# Patient Record
Sex: Male | Born: 1985 | Race: Black or African American | Hispanic: No | Smoking: Never smoker
Health system: Southern US, Community
[De-identification: ages and names within clinical notes are randomized; demographics above are authoritative.]

## PROBLEM LIST (undated history)

## (undated) DIAGNOSIS — K766 Portal hypertension: Secondary | ICD-10-CM

## (undated) DIAGNOSIS — K7030 Alcoholic cirrhosis of liver without ascites: Secondary | ICD-10-CM

## (undated) DIAGNOSIS — D732 Chronic congestive splenomegaly: Secondary | ICD-10-CM

## (undated) HISTORY — PX: MANDIBLE FRACTURE SURGERY: SHX706

## (undated) HISTORY — PX: COSMETIC SURGERY: SHX468

---

## 2017-05-21 ENCOUNTER — Encounter (HOSPITAL_COMMUNITY): Payer: Self-pay | Admitting: Family Medicine

## 2017-05-21 ENCOUNTER — Emergency Department (HOSPITAL_COMMUNITY)
Admission: EM | Admit: 2017-05-21 | Discharge: 2017-05-21 | Disposition: A | Discharge status: Home / Self Care | Payer: No Typology Code available for payment source | Attending: Emergency Medicine | Admitting: Emergency Medicine

## 2017-05-21 DIAGNOSIS — S60511A Abrasion of right hand, initial encounter: Secondary | ICD-10-CM | POA: Diagnosis not present

## 2017-05-21 DIAGNOSIS — T07XXXA Unspecified multiple injuries, initial encounter: Secondary | ICD-10-CM | POA: Diagnosis present

## 2017-05-21 DIAGNOSIS — Y999 Unspecified external cause status: Secondary | ICD-10-CM | POA: Diagnosis not present

## 2017-05-21 DIAGNOSIS — S00411A Abrasion of right ear, initial encounter: Secondary | ICD-10-CM | POA: Insufficient documentation

## 2017-05-21 DIAGNOSIS — S50811A Abrasion of right forearm, initial encounter: Secondary | ICD-10-CM | POA: Insufficient documentation

## 2017-05-21 DIAGNOSIS — Y9389 Activity, other specified: Secondary | ICD-10-CM | POA: Diagnosis not present

## 2017-05-21 DIAGNOSIS — Y92410 Unspecified street and highway as the place of occurrence of the external cause: Secondary | ICD-10-CM | POA: Insufficient documentation

## 2017-05-21 MED ORDER — IBUPROFEN 600 MG PO TABS: 600.0000 mg | ORAL_TABLET | Freq: Four times a day (QID) | ORAL | 0 refills | Status: DC | PRN

## 2017-05-21 MED ORDER — CYCLOBENZAPRINE HCL 10 MG PO TABS: 10.0000 mg | ORAL_TABLET | Freq: Two times a day (BID) | ORAL | 0 refills | Status: DC | PRN
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
## 2017-07-10 ENCOUNTER — Encounter (HOSPITAL_COMMUNITY): Payer: Self-pay

## 2017-07-10 ENCOUNTER — Emergency Department (HOSPITAL_COMMUNITY)
Admission: EM | Admit: 2017-07-10 | Discharge: 2017-07-10 | Disposition: A | Discharge status: Home / Self Care | Payer: Self-pay | Attending: Emergency Medicine | Admitting: Emergency Medicine

## 2017-07-10 DIAGNOSIS — Y990 Civilian activity done for income or pay: Secondary | ICD-10-CM | POA: Insufficient documentation

## 2017-07-10 DIAGNOSIS — Y9301 Activity, walking, marching and hiking: Secondary | ICD-10-CM | POA: Insufficient documentation

## 2017-07-10 DIAGNOSIS — S41112A Laceration without foreign body of left upper arm, initial encounter: Secondary | ICD-10-CM | POA: Insufficient documentation

## 2017-07-10 DIAGNOSIS — Y929 Unspecified place or not applicable: Secondary | ICD-10-CM | POA: Insufficient documentation

## 2017-07-10 DIAGNOSIS — W260XXA Contact with knife, initial encounter: Secondary | ICD-10-CM | POA: Insufficient documentation

## 2017-07-10 DIAGNOSIS — Z23 Encounter for immunization: Secondary | ICD-10-CM | POA: Insufficient documentation

## 2017-07-10 MED ORDER — LIDOCAINE-EPINEPHRINE (PF) 2 %-1:200000 IJ SOLN
10.0000 mL | Freq: Once | INTRAMUSCULAR | Status: AC
  Administered 2017-07-10: 10 mL
  Filled 2017-07-10: qty 20

## 2017-07-10 MED ORDER — TETANUS-DIPHTH-ACELL PERTUSSIS 5-2.5-18.5 LF-MCG/0.5 IM SUSP
0.5000 mL | Freq: Once | INTRAMUSCULAR | Status: AC
  Administered 2017-07-10: 0.5 mL via INTRAMUSCULAR
  Filled 2017-07-10: qty 0.5
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
## 2017-07-10 NOTE — Discharge Instructions (Signed)
Please read attached information. If you experience any new or worsening signs or symptoms please return to the emergency room for evaluation. Please follow-up with your primary care provider or specialist as discussed. Please use medication prescribed only as directed and discontinue taking if you have any concerning signs or symptoms.   °

## 2017-07-10 NOTE — ED Provider Notes (Signed)
[facility]-EMERGENCY DEPT Provider Note   CSN: [id number] Arrival date & time: 07/10/17  1403     History   Chief Complaint Chief Complaint  Patient presents with  . Laceration    HPI [name] is a 31 y.o. male.  HPI    31 year old male presents today with a laceration to his left arm.  Patient reports he was at work when he cut his left upper arm on a knife that was sitting on a shelf.  He notes a small laceration bleeding controlled.  Uncertain tetanus status.  No neurological deficits, no comp gating features.  No medications prior to arrival.  History reviewed. No pertinent past medical history.  There are no active problems to display for this patient.   Past Surgical History:  Procedure Laterality Date  . MANDIBLE FRACTURE SURGERY         Home Medications    Prior to Admission medications   Medication Sig Start Date End Date Taking? Authorizing Provider  cyclobenzaprine (FLEXERIL) 10 MG tablet Take 1 tablet (10 mg total) by mouth 2 (two) times daily as needed for muscle spasms. 05/21/17   Fayrene Helper, PA-C  ibuprofen (ADVIL,MOTRIN) 600 MG tablet Take 1 tablet (600 mg total) by mouth every 6 (six) hours as needed. 05/21/17   Fayrene Helper, PA-C    Family History No family history on file.  Social History Social History  Substance Use Topics  . Smoking status: Never Smoker  . Smokeless tobacco: Never Used  . Alcohol use Yes     Comment: 3-4 times a week.      Allergies   Patient has no known allergies.   Review of Systems Review of Systems  All other systems reviewed and are negative.    Physical Exam Updated Vital Signs BP (!) 156/101 (BP Location: Right Arm)   Pulse 70   Temp 98.6 F (37 C) (Oral)   Resp 18   SpO2 98%   Physical Exam  Constitutional: He is oriented to person, place, and time. He appears well-developed and well-nourished.  HENT:  Head: Normocephalic and atraumatic.  Eyes: Pupils are equal, round, and reactive to light.  Conjunctivae are normal. Right eye exhibits no discharge. Left eye exhibits no discharge. No scleral icterus.  Neck: Normal range of motion. No JVD present. No tracheal deviation present.  Pulmonary/Chest: Effort normal. No stridor.  Musculoskeletal:  1 cm laceration to the lateral proximal upper extremity-no surrounding redness or discharge-no deep space involvement  Neurological: He is alert and oriented to person, place, and time. Coordination normal.  Psychiatric: He has a normal mood and affect. His behavior is normal. Judgment and thought content normal.  Nursing note and vitals reviewed.    ED Treatments / Results  Labs (all labs ordered are listed, but only abnormal results are displayed) Labs Reviewed - No data to display  EKG  EKG Interpretation None       Radiology No results found.  Procedures .Marland KitchenLaceration Repair Date/Time: 07/10/2017 5:58 PM Performed by: Curlene Dolphin, Kelse Ploch Authorized by: Curlene Dolphin, Catilyn Boggus   Consent:    Alternatives discussed:  No treatment, delayed treatment, observation and referral Anesthesia (see MAR for exact dosages):    Anesthesia method:  Local infiltration   Local anesthetic:  Lidocaine 2% WITH epi Laceration details:    Location:  Shoulder/arm   Shoulder/arm location:  L upper arm   Length (cm):  1 Repair type:    Repair type:  Simple Pre-procedure details:    Preparation:  Patient  was prepped and draped in usual sterile fashion Exploration:    Hemostasis achieved with:  Direct pressure   Wound exploration: wound explored through full range of motion     Wound extent: no fascia violation noted, no foreign bodies/material noted, no muscle damage noted, no nerve damage noted, no tendon damage noted, no underlying fracture noted and no vascular damage noted     Contaminated: no   Treatment:    Area cleansed with:  Betadine and soap and water   Amount of cleaning:  Standard   Irrigation solution:  Tap water Skin repair:    Repair  method:  Sutures   Suture size:  4-0   Suture material:  Fast-absorbing gut   Number of sutures:  2 Approximation:    Approximation:  Close   Vermilion border: well-aligned   Post-procedure details:    Dressing:  Non-adherent dressing   Patient tolerance of procedure:  Tolerated well, no immediate complications   (including critical care time)    Medications Ordered in ED Medications  lidocaine-EPINEPHrine (XYLOCAINE W/EPI) 2 %-1:200000 (PF) injection 10 mL (10 mLs Infiltration Given 07/10/17 1702)  Tdap (BOOSTRIX) injection 0.5 mL (0.5 mLs Intramuscular Given 07/10/17 1703)     Initial Impression / Assessment and Plan / ED Course  I have reviewed the triage vital signs and the nursing notes.  Pertinent labs & imaging results that were available during my care of the patient were reviewed by me and considered in my medical decision making (see chart for details).      Final Clinical Impressions(s) / ED Diagnoses   Final diagnoses:  Laceration of left upper extremity, initial encounter    Labs:   Imaging:  Consults:  Therapeutics:  Discharge Meds:   Assessment/Plan: 31 year old male presents today with laceration.  No deep space involvement, thoroughly cleansed and repaired without complication.  Return precautions given, care instructions given.  Patient verbalized understanding and agreement to today's plan had no further questions or concerns at time discharge.      New Prescriptions New Prescriptions   No medications on file     Eyvonne Mechanic, Cordelia Poche 07/10/17 1800    Pricilla Loveless, MD 07/11/17 1659

## 2019-04-29 DIAGNOSIS — R809 Proteinuria, unspecified: Secondary | ICD-10-CM | POA: Insufficient documentation

## 2019-04-29 DIAGNOSIS — R7303 Prediabetes: Secondary | ICD-10-CM | POA: Diagnosis present

## 2019-12-23 DIAGNOSIS — R771 Abnormality of globulin: Secondary | ICD-10-CM | POA: Insufficient documentation

## 2021-04-25 ENCOUNTER — Inpatient Hospital Stay (HOSPITAL_COMMUNITY): Payer: Medicaid Other

## 2021-04-25 ENCOUNTER — Inpatient Hospital Stay (HOSPITAL_BASED_OUTPATIENT_CLINIC_OR_DEPARTMENT_OTHER)
Admission: EM | Admit: 2021-04-25 | Discharge: 2021-04-28 | DRG: 300 | Disposition: A | Discharge status: Home / Self Care | Payer: Medicaid Other | Attending: Internal Medicine | Admitting: Internal Medicine

## 2021-04-25 ENCOUNTER — Emergency Department (HOSPITAL_BASED_OUTPATIENT_CLINIC_OR_DEPARTMENT_OTHER): Payer: Medicaid Other

## 2021-04-25 ENCOUNTER — Encounter (HOSPITAL_BASED_OUTPATIENT_CLINIC_OR_DEPARTMENT_OTHER): Payer: Self-pay

## 2021-04-25 ENCOUNTER — Other Ambulatory Visit: Payer: Self-pay

## 2021-04-25 DIAGNOSIS — W2201XA Walked into wall, initial encounter: Secondary | ICD-10-CM | POA: Diagnosis present

## 2021-04-25 DIAGNOSIS — F17290 Nicotine dependence, other tobacco product, uncomplicated: Secondary | ICD-10-CM | POA: Diagnosis present

## 2021-04-25 DIAGNOSIS — K3189 Other diseases of stomach and duodenum: Secondary | ICD-10-CM | POA: Diagnosis present

## 2021-04-25 DIAGNOSIS — Y92009 Unspecified place in unspecified non-institutional (private) residence as the place of occurrence of the external cause: Secondary | ICD-10-CM

## 2021-04-25 DIAGNOSIS — I10 Essential (primary) hypertension: Secondary | ICD-10-CM

## 2021-04-25 DIAGNOSIS — D62 Acute posthemorrhagic anemia: Secondary | ICD-10-CM | POA: Diagnosis present

## 2021-04-25 DIAGNOSIS — I864 Gastric varices: Principal | ICD-10-CM | POA: Diagnosis present

## 2021-04-25 DIAGNOSIS — Z20822 Contact with and (suspected) exposure to covid-19: Secondary | ICD-10-CM | POA: Diagnosis present

## 2021-04-25 DIAGNOSIS — F1010 Alcohol abuse, uncomplicated: Secondary | ICD-10-CM | POA: Diagnosis present

## 2021-04-25 DIAGNOSIS — I8510 Secondary esophageal varices without bleeding: Secondary | ICD-10-CM | POA: Diagnosis present

## 2021-04-25 DIAGNOSIS — D500 Iron deficiency anemia secondary to blood loss (chronic): Secondary | ICD-10-CM

## 2021-04-25 DIAGNOSIS — K760 Fatty (change of) liver, not elsewhere classified: Secondary | ICD-10-CM | POA: Diagnosis present

## 2021-04-25 DIAGNOSIS — W19XXXA Unspecified fall, initial encounter: Secondary | ICD-10-CM | POA: Diagnosis present

## 2021-04-25 DIAGNOSIS — K7030 Alcoholic cirrhosis of liver without ascites: Secondary | ICD-10-CM | POA: Diagnosis present

## 2021-04-25 DIAGNOSIS — Z23 Encounter for immunization: Secondary | ICD-10-CM | POA: Diagnosis not present

## 2021-04-25 DIAGNOSIS — K920 Hematemesis: Secondary | ICD-10-CM | POA: Diagnosis present

## 2021-04-25 DIAGNOSIS — R17 Unspecified jaundice: Secondary | ICD-10-CM

## 2021-04-25 DIAGNOSIS — R7401 Elevation of levels of liver transaminase levels: Secondary | ICD-10-CM

## 2021-04-25 DIAGNOSIS — R791 Abnormal coagulation profile: Secondary | ICD-10-CM | POA: Diagnosis present

## 2021-04-25 DIAGNOSIS — K766 Portal hypertension: Secondary | ICD-10-CM | POA: Diagnosis present

## 2021-04-25 DIAGNOSIS — K922 Gastrointestinal hemorrhage, unspecified: Secondary | ICD-10-CM | POA: Diagnosis present

## 2021-04-25 LAB — BASIC METABOLIC PANEL
Anion gap: 8 (ref 5–15)
BUN: 12 mg/dL (ref 6–20)
CO2: 27 mmol/L (ref 22–32)
Calcium: 7.5 mg/dL — ABNORMAL LOW (ref 8.9–10.3)
Chloride: 105 mmol/L (ref 98–111)
Creatinine, Ser: 0.71 mg/dL (ref 0.61–1.24)
GFR, Estimated: >60 mL/min (ref >60)
Glucose, Bld: 114 mg/dL — ABNORMAL HIGH (ref 70–99)
Potassium: 3.7 mmol/L (ref 3.5–5.1)
Sodium: 140 mmol/L (ref 135–145)

## 2021-04-25 LAB — RESP PANEL BY RT-PCR (FLU A&B, COVID) ARPGX2
Influenza A by PCR: NEGATIVE
Influenza B by PCR: NEGATIVE
SARS Coronavirus 2 by RT PCR: NEGATIVE

## 2021-04-25 LAB — URINALYSIS, ROUTINE W REFLEX MICROSCOPIC
Bilirubin Urine: NEGATIVE
Glucose, UA: NEGATIVE mg/dL
Hgb urine dipstick: NEGATIVE
Ketones, ur: NEGATIVE mg/dL
Leukocytes,Ua: NEGATIVE
Nitrite: NEGATIVE
Specific Gravity, Urine: 1.021 (ref 1.005–1.030)
pH: 7 (ref 5.0–8.0)

## 2021-04-25 LAB — CBC
HCT: 15.5 % — ABNORMAL LOW (ref 39.0–52.0)
HCT: 12.8 % — ABNORMAL LOW (ref 39.0–52.0)
Hemoglobin: 4.6 g/dL — CL (ref 13.0–17.0)
Hemoglobin: 4 g/dL — CL (ref 13.0–17.0)
MCH: 21.8 pg — ABNORMAL LOW (ref 26.0–34.0)
MCH: 22.2 pg — ABNORMAL LOW (ref 26.0–34.0)
MCHC: 29.7 g/dL — ABNORMAL LOW (ref 30.0–36.0)
MCHC: 31.3 g/dL (ref 30.0–36.0)
MCV: 73.5 fL — ABNORMAL LOW (ref 80.0–100.0)
MCV: 71.1 fL — ABNORMAL LOW (ref 80.0–100.0)
Platelets: 153 10*3/uL (ref 150–400)
Platelets: 159 10*3/uL (ref 150–400)
RBC: 2.11 MIL/uL — ABNORMAL LOW (ref 4.22–5.81)
RBC: 1.8 MIL/uL — ABNORMAL LOW (ref 4.22–5.81)
RDW: 19.7 % — ABNORMAL HIGH (ref 11.5–15.5)
RDW: 20.1 % — ABNORMAL HIGH (ref 11.5–15.5)
WBC: 5.2 10*3/uL (ref 4.0–10.5)
WBC: 5.6 10*3/uL (ref 4.0–10.5)
nRBC: 0 % (ref 0.0–0.2)
nRBC: 0 % (ref 0.0–0.2)

## 2021-04-25 LAB — PROTIME-INR
INR: 2.1 — ABNORMAL HIGH (ref 0.8–1.2)
Prothrombin Time: 23.1 seconds — ABNORMAL HIGH (ref 11.4–15.2)

## 2021-04-25 LAB — HEPATIC FUNCTION PANEL
ALT: 25 U/L (ref 0–44)
AST: 90 U/L — ABNORMAL HIGH (ref 15–41)
Albumin: 2.7 g/dL — ABNORMAL LOW (ref 3.5–5.0)
Alkaline Phosphatase: 73 U/L (ref 38–126)
Bilirubin, Direct: 2 mg/dL — ABNORMAL HIGH (ref 0.0–0.2)
Indirect Bilirubin: 2.4 mg/dL — ABNORMAL HIGH (ref 0.3–0.9)
Total Bilirubin: 4.4 mg/dL — ABNORMAL HIGH (ref 0.3–1.2)
Total Protein: 7.8 g/dL (ref 6.5–8.1)

## 2021-04-25 LAB — PREPARE RBC (CROSSMATCH)

## 2021-04-25 LAB — CBG MONITORING, ED: Glucose-Capillary: 114 mg/dL — ABNORMAL HIGH (ref 70–99)

## 2021-04-25 LAB — ABO/RH: ABO/RH(D): O POS

## 2021-04-25 LAB — OCCULT BLOOD X 1 CARD TO LAB, STOOL: Fecal Occult Bld: POSITIVE — AB

## 2021-04-25 LAB — MRSA NEXT GEN BY PCR, NASAL: MRSA by PCR Next Gen: NOT DETECTED

## 2021-04-25 MED ORDER — MORPHINE SULFATE (PF) 2 MG/ML IV SOLN: 1.0000 mg | INTRAVENOUS | Status: DC | PRN

## 2021-04-25 MED ORDER — OCTREOTIDE LOAD VIA INFUSION
50.0000 ug | Freq: Once | INTRAVENOUS | Status: AC
Start: 2021-04-25 — End: 2021-04-25
  Administered 2021-04-25: 50 ug via INTRAVENOUS
  Filled 2021-04-25: qty 25

## 2021-04-25 MED ORDER — ACETAMINOPHEN 650 MG RE SUPP: 650.0000 mg | Freq: Four times a day (QID) | RECTAL | Status: DC | PRN

## 2021-04-25 MED ORDER — CHLORHEXIDINE GLUCONATE CLOTH 2 % EX PADS
6.0000 | MEDICATED_PAD | Freq: Every day | CUTANEOUS | Status: DC
  Administered 2021-04-25 – 2021-04-28 (×3): 6 via TOPICAL

## 2021-04-25 MED ORDER — ONDANSETRON HCL 4 MG/2ML IJ SOLN: 4.0000 mg | Freq: Four times a day (QID) | INTRAMUSCULAR | Status: DC | PRN

## 2021-04-25 MED ORDER — SODIUM CHLORIDE 0.9 % IV SOLN: 250.0000 mL | INTRAVENOUS | Status: DC | PRN

## 2021-04-25 MED ORDER — LACTATED RINGERS IV BOLUS
1000.0000 mL | Freq: Once | INTRAVENOUS | Status: AC
  Administered 2021-04-25: 1000 mL via INTRAVENOUS

## 2021-04-25 MED ORDER — ONDANSETRON HCL 4 MG PO TABS: 4.0000 mg | ORAL_TABLET | Freq: Four times a day (QID) | ORAL | Status: DC | PRN

## 2021-04-25 MED ORDER — SODIUM CHLORIDE 0.9 % IV SOLN
2.0000 g | INTRAVENOUS | Status: DC
  Administered 2021-04-25 – 2021-04-27 (×3): 2 g via INTRAVENOUS
  Filled 2021-04-25: qty 20
  Filled 2021-04-25: qty 2
  Filled 2021-04-25: qty 20
  Filled 2021-04-25: qty 2
  Filled 2021-04-25 (×3): qty 20

## 2021-04-25 MED ORDER — PANTOPRAZOLE INFUSION (NEW) - SIMPLE MED
8.0000 mg/h | INTRAVENOUS | Status: AC
  Administered 2021-04-25 – 2021-04-28 (×6): 8 mg/h via INTRAVENOUS
  Filled 2021-04-25 (×2): qty 80
  Filled 2021-04-25 (×3): qty 100
  Filled 2021-04-25 (×2): qty 80
  Filled 2021-04-25: qty 100
  Filled 2021-04-25: qty 80

## 2021-04-25 MED ORDER — SODIUM CHLORIDE 0.9 % IV SOLN
50.0000 ug/h | INTRAVENOUS | Status: DC
  Administered 2021-04-25 – 2021-04-28 (×6): 50 ug/h via INTRAVENOUS
  Filled 2021-04-25 (×9): qty 1

## 2021-04-25 MED ORDER — ACETAMINOPHEN 325 MG PO TABS: 650.0000 mg | ORAL_TABLET | Freq: Four times a day (QID) | ORAL | Status: DC | PRN

## 2021-04-25 MED ORDER — ONDANSETRON HCL 4 MG/2ML IJ SOLN
4.0000 mg | Freq: Once | INTRAMUSCULAR | Status: AC
  Administered 2021-04-25: 4 mg via INTRAVENOUS
  Filled 2021-04-25 (×2): qty 2

## 2021-04-25 MED ORDER — SODIUM CHLORIDE 0.9% FLUSH: 3.0000 mL | INTRAVENOUS | Status: DC | PRN

## 2021-04-25 MED ORDER — SODIUM CHLORIDE 0.9 % IV SOLN
10.0000 mL/h | Freq: Once | INTRAVENOUS | Status: AC
Start: 2021-04-25 — End: 2021-04-26
  Administered 2021-04-25: 10 mL/h via INTRAVENOUS

## 2021-04-25 MED ORDER — VITAMIN K1 10 MG/ML IJ SOLN
10.0000 mg | Freq: Once | INTRAVENOUS | Status: AC
  Administered 2021-04-25: 10 mg via INTRAVENOUS
  Filled 2021-04-25: qty 1

## 2021-04-25 MED ORDER — OCTREOTIDE ACETATE 50 MCG/ML IJ SOLN
INTRAMUSCULAR | Status: AC
  Administered 2021-04-25: 50 ug
  Filled 2021-04-25: qty 10

## 2021-04-25 MED ORDER — SODIUM CHLORIDE 0.9% FLUSH
3.0000 mL | Freq: Two times a day (BID) | INTRAVENOUS | Status: DC
  Administered 2021-04-25 – 2021-04-28 (×5): 3 mL via INTRAVENOUS

## 2021-04-25 MED ORDER — PANTOPRAZOLE 80MG IVPB - SIMPLE MED
80.0000 mg | Freq: Once | INTRAVENOUS | Status: AC
  Administered 2021-04-25: 80 mg via INTRAVENOUS
  Filled 2021-04-25: qty 80
  Filled 2021-04-25: qty 100

## 2021-04-25 MED ORDER — ORAL CARE MOUTH RINSE
15.0000 mL | Freq: Two times a day (BID) | OROMUCOSAL | Status: DC
  Administered 2021-04-25 – 2021-04-28 (×7): 15 mL via OROMUCOSAL

## 2021-04-25 MED ORDER — SODIUM CHLORIDE 0.9 % IV SOLN
10.0000 mL/h | Freq: Once | INTRAVENOUS | Status: AC
  Administered 2021-04-25: 10 mL/h via INTRAVENOUS

## 2021-04-25 MED ORDER — PANTOPRAZOLE SODIUM 40 MG IV SOLR: 40.0000 mg | Freq: Two times a day (BID) | INTRAVENOUS | Status: DC

## 2021-04-25 NOTE — ED Provider Notes (Signed)
Care of the patient assumed at the change of shift. Here for syncope at home and hematemesis. Found to be anemia. Awaiting hospitalist consult.  Physical Exam  BP (!) 148/68 (BP Location: Right Arm)   Pulse 91   Temp 97.7 F (36.5 C) (Oral)   Resp (!) 22   Ht 5\' 9"  (1.753 m)   Wt 88 kg   SpO2 99%   BMI 28.65 kg/m   Physical Exam Awake alert No distress  ED Course/Procedures     Procedures  MDM  Patient has had one additional episode of bright red hematemesis in the ED. History of non-specific liver disease, previously saw Hepatologist in [place] but has not followed up recently. Reports he no longer drinks. Getting Octreotide and Protonix infusions. INR is elevated  8:31 AM Spoke with Dr. [place], Hospitalist, who requests the patient be admitted to ICU. Critical care has been paged.   8:45 AM Spoke with ICU, there are no progressive or ICU beds available. In order to expedite patient's evaluation by GI for endoscopy and to begin blood transfusion, will send to the [facility]. Dr[facility] accepting. GI paged.   9:23 AM Spoke with Dr. Rubin Payor, GI who agrees with plan to transfer to [facility]. He requests PRBC and FFP transfusion prior to endoscopy. Dr. [facility] aware.         Rubin Payor, MD 04/25/21 [phone]

## 2021-04-25 NOTE — ED Notes (Signed)
[facility] at bedside to take pt to [facility] ED.  Report given to Joni Reining, Charity fundraiser.

## 2021-04-25 NOTE — ED Triage Notes (Signed)
Patient here POV from Home with Bloody Emesis and Syncope.  Patients Wife states he had two syncopal episodes today approximately 2 Hours PTA. Patient was using the BR and when Patient woke up the Patient began vomiting large amounts of Bloody Emesis. Patient then had another syncopal episodes.  Ambulatory, GCS 15. NAD Noted.

## 2021-04-25 NOTE — H&P (Signed)
History and Physical    [name] IOE:[id number] DOB: 07-Mar-1986 DOA: 04/25/2021  PCP: Patient, No Pcp Per (Inactive)   Patient coming from:   Chief Complaint: syncope and hematemesis  HPI: [name] is a 35 y.o. male with medical history significant of hypertension who presented after sustaining a syncope episode.  Patient reported to been at his usual state of health until about 3 AM on the day of hospitalization when he remembers getting out of the bed and walking to the bathroom.  The next thing he remembers is laying on the floor, his girlfriend helping him and EMS arriving. Apparently patient sustained a syncope episode while getting into the bathroom, his girlfriend found him pale and clammy, she went to get a cover and when she returned patient had bloody vomitus around him. On direct questioning he does mention remembering hematemesis while on the floor. Denies any abdominal pain, melena or hematochezia.  Denies any NSAID use, alcohol or tobacco abuse. He does acknowledge being under significant psychological stress.   ED Course: He was diagnosed with significant anemia, GI was contacted, 3 units packed red blood cells and 2 units of FFP were ordered.  Referred for further admission, medical treatment and endoscopic procedure.   Review of Systems:  1. General: No fevers, no chills, no weight gain or weight loss 2. ENT: No runny nose or sore throat, no hearing disturbances 3. Pulmonary: No dyspnea, cough, wheezing, or hemoptysis 4. Cardiovascular: No angina, claudication, lower extremity edema, pnd or orthopnea 5. Gastrointestinal: No nausea or vomiting, no diarrhea or constipation, positive hematemesis as mentioned in history present illness. 6. Hematology: No easy bruisability or frequent infections 7. Urology: No dysuria, hematuria or increased urinary frequency 8. Dermatology: No rashes. 9. Neurology: No seizures or paresthesias 10. Musculoskeletal: No joint pain  or deformities  History reviewed. No pertinent past medical history.  Past Surgical History:  Procedure Laterality Date   MANDIBLE FRACTURE SURGERY       reports that he has never smoked. He has never used smokeless tobacco. He reports previous alcohol use. He reports that he does not use drugs.  No Known Allergies  No family history on file.   Prior to Admission medications   Medication Sig Start Date End Date Taking? Authorizing Provider  cyclobenzaprine (FLEXERIL) 10 MG tablet Take 1 tablet (10 mg total) by mouth 2 (two) times daily as needed for muscle spasms. 05/21/17   Fayrene Helper, PA-C  ibuprofen (ADVIL,MOTRIN) 600 MG tablet Take 1 tablet (600 mg total) by mouth every 6 (six) hours as needed. 05/21/17   Fayrene Helper, PA-C    Physical Exam: Vitals:   04/25/21 0911 04/25/21 1004 04/25/21 1007 04/25/21 1030  BP: (!) 115/41 (!) 127/52 (!) 127/52 (!) 121/49  Pulse: 96 98 (!) 101 98  Resp: 15 18 18 18   Temp: 97.6 F (36.4 C)     TempSrc: Oral     SpO2: 100% 100% 100% 100%  Weight:      Height:        Vitals:   04/25/21 0911 04/25/21 1004 04/25/21 1007 04/25/21 1030  BP: (!) 115/41 (!) 127/52 (!) 127/52 (!) 121/49  Pulse: 96 98 (!) 101 98  Resp: 15 18 18 18   Temp: 97.6 F (36.4 C)     TempSrc: Oral     SpO2: 100% 100% 100% 100%  Weight:      Height:       General: Not in pain or dyspnea, deconditioned Neurology: Awake and  alert, non focal Head and Neck. Head normocephalic. Neck supple with no adenopathy or thyromegaly.   E ENT: positive pallor, positive icterus, oral mucosa moist Cardiovascular: No JVD. S1-S2 present, tachycardic, rhythmic, no gallops, rubs, or murmurs. No lower extremity edema. Pulmonary: positive breath sounds bilaterally, adequate air movement, no wheezing, rhonchi or rales. Gastrointestinal. Abdomen soft and non tender, no organomegally Skin. No rashes Musculoskeletal: no joint deformities    Labs on Admission: I have personally reviewed  following labs and imaging studies  CBC: Recent Labs  Lab 04/25/21 0542 04/25/21 0834  WBC 5.2 5.6  HGB 4.6* 4.0*  HCT 15.5* 12.8*  MCV 73.5* 71.1*  PLT 153 159   Basic Metabolic Panel: Recent Labs  Lab 04/25/21 0542  NA 140  K 3.7  CL 105  CO2 27  GLUCOSE 114*  BUN 12  CREATININE 0.71  CALCIUM 7.5*   GFR: Estimated Creatinine Clearance: 138.8 mL/min (by C-G formula based on SCr of 0.71 mg/dL). Liver Function Tests: Recent Labs  Lab 04/25/21 0542  AST 90*  ALT 25  ALKPHOS 73  BILITOT 4.4*  PROT 7.8  ALBUMIN 2.7*   No results for input(s): LIPASE, AMYLASE in the last 168 hours. No results for input(s): AMMONIA in the last 168 hours. Coagulation Profile: Recent Labs  Lab 04/25/21 0542  INR 2.1*   Cardiac Enzymes: No results for input(s): CKTOTAL, CKMB, CKMBINDEX, TROPONINI in the last 168 hours. BNP (last 3 results) No results for input(s): PROBNP in the last 8760 hours. HbA1C: No results for input(s): HGBA1C in the last 72 hours. CBG: Recent Labs  Lab 04/25/21 0539  GLUCAP 114*   Lipid Profile: No results for input(s): CHOL, HDL, LDLCALC, TRIG, CHOLHDL, LDLDIRECT in the last 72 hours. Thyroid Function Tests: No results for input(s): TSH, T4TOTAL, FREET4, T3FREE, THYROIDAB in the last 72 hours. Anemia Panel: No results for input(s): VITAMINB12, FOLATE, FERRITIN, TIBC, IRON, RETICCTPCT in the last 72 hours. Urine analysis:    Component Value Date/Time   COLORURINE YELLOW 04/25/2021 0740   APPEARANCEUR CLEAR 04/25/2021 0740   LABSPEC 1.021 04/25/2021 0740   PHURINE 7.0 04/25/2021 0740   GLUCOSEU NEGATIVE 04/25/2021 0740   HGBUR NEGATIVE 04/25/2021 0740   BILIRUBINUR NEGATIVE 04/25/2021 0740   KETONESUR NEGATIVE 04/25/2021 0740   PROTEINUR TRACE (A) 04/25/2021 0740   NITRITE NEGATIVE 04/25/2021 0740   LEUKOCYTESUR NEGATIVE 04/25/2021 0740    Radiological Exams on Admission: CT Head Wo Contrast  Result Date: 04/25/2021 CLINICAL DATA:   Syncopal episodes. EXAM: CT HEAD WITHOUT CONTRAST TECHNIQUE: Contiguous axial images were obtained from the base of the skull through the vertex without intravenous contrast. COMPARISON:  None. FINDINGS: Brain: The ventricles are normal in size and configuration. No extra-axial fluid collections are identified. The gray-white differentiation is maintained. No CT findings for acute hemispheric infarction or intracranial hemorrhage. No mass lesions. The brainstem and cerebellum are normal. Vascular: No hyperdense vessels or obvious aneurysm. Skull: No acute skull fracture. No bone lesion. Sinuses/Orbits: The paranasal sinuses and mastoid air cells are clear except for scattered mucoperiosteal thickening involving the ethmoid air cells. The globes are intact. Other: No scalp lesions or scalp hematoma. IMPRESSION: No acute intracranial findings or mass lesion. Electronically Signed   By: Rudie Meyer M.D.   On: 04/25/2021 06:37    EKG: Independently reviewed.  94 bpm, normal axis, normal intervals, sinus rhythm, no significant ST segment or T wave changes.  Assessment/Plan Principal Problem:   GI bleeding Active Problems:   Essential  hypertension   Acute blood loss anemia  35 year old male with no significant past medical history besides diet controlled hypertension who presents with syncope episode, along with hematemesis.  Positive increase psychological stress lately, denies any alcohol, tobacco or NSAID use.  On his initial physical examination he has resting tachycardia 102 bpm, blood pressure 108/51, temperature 97.8, respiratory rate 21, oxygen saturation 100% on room air. His lungs are clear to auscultation, heart S1-S2, present, tachycardic, abdomen soft and nontender, no lower extremity edema.  Sodium 140, potassium 3.7, chloride 105, bicarb 27, glucose 114, BUN 12, creatinine 0.71, AST 90, ALT 25, total bilirubin is 4.4, indirect 2.4, direct 2.0. White cell count 5.2, hemoglobin 4.6,  hematocrit 15.5, platelets 153. INR 2.1. SARS COVID-19 negative.  Urinalysis specific gravity 1.021. Head CT negative for acute changes.  Acute blood loss anemia due to acute upper GI bleed.  Patient will be admitted to the stepdown unit for close monitoring. Ordered 3 units packed red blood cells and 2 units of FFP. Plan to add 10 mg of vitamin K Continue intravenous proton pump inhibitor with pantoprazole, as needed analgesics and antiemetics. Intravenous fluids 1 completed PRBC transfusion.  GI has ordered octreotide and ceftriaxone.   I will order ultrasonography of the abdomen to evaluate for positive chronic liver disease.  Prompted endoscopic procedure for further diagnosis and treatment.  2.  Hypertension.  Continue blood pressure monitoring.  3.  Remote history of alcohol abuse.  Apparently stopped drinking April 2021.  No signs of acute withdrawal.  Status is: Inpatient  Remains inpatient appropriate because:IV treatments appropriate due to intensity of illness or inability to take PO  Dispo: The patient is from: Home              Anticipated d/c is to: Home              Patient currently is not medically stable to d/c.   Difficult to place patient No    DVT prophylaxis: Scd   Code Status:   full  Family Communication:  I spoke with patient's parents at the bedside, we talked in detail about patient's condition, plan of care and prognosis and all questions were addressed.     Consults called:  GI   Admission status:   Inpatient    [place] Stegemann Annett Gula MD [facility]   04/25/2021, 10:51 AM

## 2021-04-25 NOTE — ED Notes (Signed)
MD Preston Fleeting made aware of Critical Result (Hemoglobin 4.6); now new orders at this time.

## 2021-04-25 NOTE — Consult Note (Signed)
Referring Provider: [facility] Primary Care Physician:  Patient, No Pcp Per (Inactive) Primary Gastroenterologist:  Gentry Fitz  Reason for Consultation:  Upper GI bleeding   HPI: [name] is a 35 y.o. male with past medical history of fatty liver disease presenting for consultation of upper GI bleeding.  Patient states he was in his usual state of health until earlier this morning, when he had a syncopal episode.  Following the syncopal episode, he states he had nausea and vomited food, then proceeded to vomit a large amount of red blood.  He had an additional episode of hematemesis at [facility] ED.  He denies any abdominal pain, melena, or hematochezia.  Denies dysphagia, GERD.  Denies prior history of upper GI bleeding.  He said [facility]) in 2021 for abnormal LFTs, but he did not follow-up.  He has not seen any providers recently.  He does state that he stopped drinking alcohol after his visit with Annamarie Major in 12/2019.  Denies aspirin, NSAID, blood thinner use.  Denies any family history of liver disease.  History reviewed. No pertinent past medical history.  Past Surgical History:  Procedure Laterality Date   MANDIBLE FRACTURE SURGERY      Prior to Admission medications   Medication Sig Start Date End Date Taking? Authorizing Provider  cyclobenzaprine (FLEXERIL) 10 MG tablet Take 1 tablet (10 mg total) by mouth 2 (two) times daily as needed for muscle spasms. 05/21/17   Fayrene Helper, PA-C  ibuprofen (ADVIL,MOTRIN) 600 MG tablet Take 1 tablet (600 mg total) by mouth every 6 (six) hours as needed. 05/21/17   Fayrene Helper, PA-C    Scheduled Meds:  [START ON 04/28/2021] pantoprazole  40 mg Intravenous Q12H   Continuous Infusions:  sodium chloride     sodium chloride     octreotide  (SANDOSTATIN)    IV infusion 50 mcg/hr (04/25/21 0702)   pantoprazole     pantoprazole 8 mg/hr (04/25/21 0732)   PRN Meds:.  Allergies as of 04/25/2021   (No Known Allergies)    No  family history on file.  Social History   Socioeconomic History   Marital status: Single    Spouse name: Not on file   Number of children: Not on file   Years of education: Not on file   Highest education level: Not on file  Occupational History   Not on file  Tobacco Use   Smoking status: Never   Smokeless tobacco: Never  Vaping Use   Vaping Use: Every day  Substance and Sexual Activity   Alcohol use: Not Currently    Comment: 3-4 times a week.    Drug use: No   Sexual activity: Not on file  Other Topics Concern   Not on file  Social History Narrative   Not on file   Social Determinants of Health   Financial Resource Strain: Not on file  Food Insecurity: Not on file  Transportation Needs: Not on file  Physical Activity: Not on file  Stress: Not on file  Social Connections: Not on file  Intimate Partner Violence: Not on file    Review of Systems: Review of Systems  Constitutional:  Positive for malaise/fatigue. Negative for chills and fever.  HENT:  Negative for hearing loss and tinnitus.   Eyes:  Negative for pain and redness.  Respiratory:  Negative for cough and shortness of breath.   Cardiovascular:  Negative for chest pain and palpitations.  Gastrointestinal:  Positive for nausea and vomiting. Negative for  abdominal pain, blood in stool, constipation, diarrhea, heartburn and melena.  Skin:  Negative for itching and rash.  Neurological:  Positive for loss of consciousness. Negative for seizures.  Psychiatric/Behavioral:  Negative for substance abuse. The patient is not nervous/anxious.     Physical Exam: Vital signs: Vitals:   04/25/21 1007 04/25/21 1030  BP: (!) 127/52 (!) 121/49  Pulse: (!) 101 98  Resp: 18 18  Temp:    SpO2: 100% 100%     Physical Exam Vitals reviewed.  Constitutional:      General: He is not in acute distress. HENT:     Head: Normocephalic and atraumatic.     Nose: Nose normal. No congestion.  Eyes:     General: Scleral  icterus present.     Extraocular Movements: Extraocular movements intact.     Comments: Conjunctival pallor  Cardiovascular:     Rate and Rhythm: Regular rhythm. Tachycardia present.     Comments: Mild tachycardia (100-102 BPM) Pulmonary:     Effort: Pulmonary effort is normal. No respiratory distress.  Abdominal:     General: Bowel sounds are normal. There is no distension.     Palpations: Abdomen is soft. There is no mass.     Tenderness: There is no abdominal tenderness. There is no guarding or rebound.     Hernia: No hernia is present.  Musculoskeletal:        General: No tenderness.     Cervical back: Normal range of motion and neck supple.     Right lower leg: Edema present.     Left lower leg: Edema present.  Skin:    General: Skin is warm and dry.     Coloration: Skin is jaundiced.  Neurological:     General: No focal deficit present.     Mental Status: He is alert and oriented to person, place, and time.     Comments: No asterixis  Psychiatric:        Mood and Affect: Mood normal.        Behavior: Behavior normal. Behavior is cooperative.     GI:  Lab Results: Recent Labs    04/25/21 0542 04/25/21 0834  WBC 5.2 5.6  HGB 4.6* 4.0*  HCT 15.5* 12.8*  PLT 153 159   BMET Recent Labs    04/25/21 0542  NA 140  K 3.7  CL 105  CO2 27  GLUCOSE 114*  BUN 12  CREATININE 0.71  CALCIUM 7.5*   LFT Recent Labs    04/25/21 0542  PROT 7.8  ALBUMIN 2.7*  AST 90*  ALT 25  ALKPHOS 73  BILITOT 4.4*  BILIDIR 2.0*  IBILI 2.4*   PT/INR Recent Labs    04/25/21 0542  LABPROT 23.1*  INR 2.1*     Studies/Results: CT Head Wo Contrast  Result Date: 04/25/2021 CLINICAL DATA:  Syncopal episodes. EXAM: CT HEAD WITHOUT CONTRAST TECHNIQUE: Contiguous axial images were obtained from the base of the skull through the vertex without intravenous contrast. COMPARISON:  None. FINDINGS: Brain: The ventricles are normal in size and configuration. No extra-axial fluid  collections are identified. The gray-white differentiation is maintained. No CT findings for acute hemispheric infarction or intracranial hemorrhage. No mass lesions. The brainstem and cerebellum are normal. Vascular: No hyperdense vessels or obvious aneurysm. Skull: No acute skull fracture. No bone lesion. Sinuses/Orbits: The paranasal sinuses and mastoid air cells are clear except for scattered mucoperiosteal thickening involving the ethmoid air cells. The globes are intact. Other: No scalp  lesions or scalp hematoma. IMPRESSION: No acute intracranial findings or mass lesion. Electronically Signed   By: Rudie Meyer M.D.   On: 04/25/2021 06:37    Impression: Severe anemia: Hgb 4.6.  Concern for underlying cirrhosis, given elevated INR and T bili.  Differential includes variceal bleeding versus portal hypertensive gastropathy versus PUD. -Normal BUN (12)/ Cr (0.71)  Suspected underlying liver disease/cirrhosis, history of ETOH use (not currently), fatty liver and abnormal LFTs -INR 2.1 -T. Bili 4.4/ AST 25/ ALT 73/ ALP 73 -Normal platelets  Plan: Continue Protonix and octreotide drips.  Start Rocephin, given GI bleeding with suspected cirrhosis.  Vitamin K x1 ordered.  Plan for EGD tomorrow, when hemoglobin is >7 and INR is <2.  Patient currently has stable BP and heart rate.  I thoroughly discussed procedure with the patient to include nature, alternatives, benefits, and risks (including but not limited to bleeding, infection, perforation, anesthesia/cardiac and pulmonary complications).  Patient verbalized understanding and gave verbal consent to proceed with EGD.  Continue to monitor CBC with transfusion as needed to maintain hemoglobin greater than 7.  [facility] will follow.   LOS: 0 days   Edrick Kins  PA-C 04/25/2021, 10:42 AM  Contact #  [phone]

## 2021-04-25 NOTE — ED Notes (Signed)
Pt vomited 700 cc's bright red blood.  Dr. Bernette Mayers notified.

## 2021-04-25 NOTE — ED Provider Notes (Signed)
  Physical Exam  BP (!) 127/52   Pulse (!) 101   Temp 97.6 F (36.4 C) (Oral)   Resp 18   Ht 5\' 9"  (1.753 m)   Wt 88 kg   SpO2 100%   BMI 28.65 kg/m   Physical Exam  ED Course/Procedures     .Critical Care  Date/Time: 04/25/2021 11:29 AM Performed by: 04/27/2021, MD Authorized by: Alvira Monday, MD   Critical care provider statement:    Critical care time (minutes):  15   Critical care was time spent personally by me on the following activities:  Discussions with consultants, ordering and performing treatments and interventions, ordering and review of laboratory studies, ordering and review of radiographic studies, pulse oximetry, obtaining history from patient or surrogate and review of old charts  MDM  Received care of patient from the [facility] emergency department.  Briefly this is a 35 year old male with a history of liver disease who presented with concern for syncope and hematemesis.  His hemoglobin was noted to be 4.6, and and per notes had bright red blood per rectum.  In addition to his significant anemia, laboratory abnormalities also included an elevated INR 2.1, AST 90, albumin 2.7, and bilirubin of 4.4.  Concern for possible variceal bleed.  He was started on octreotide and Protonix at [facility] and transferred here for further management.   On arrival to the emergency department, his blood pressures are 127/52, with a heart rate around 100.  Discussed risks and benefits of blood transfusion, and ordered transfusion of 3 units of PRBCs, 2 units of FFP.  Contacted Dr. 30 gastroenterology, sent page at 10:13 and also placed order for consult. Discussed with Dr. Dulce Sellar, will plan on later endoscopy after transfusion pRBC/FFP.  Consulted hospitalist and Dr. Dulce Sellar to admit.        Ella Jubilee, MD 04/25/21 1134

## 2021-04-25 NOTE — ED Notes (Signed)
EDP Preston Fleeting made aware of Downtime on Coagulation Study Machine.

## 2021-04-25 NOTE — ED Provider Notes (Signed)
[facility] EMERGENCY DEPT Provider Note   CSN: [id number] Arrival date & time: 04/25/21  0516     History Chief Complaint  Patient presents with   Loss of Consciousness   Hematemesis    [name] is a 35 y.o. male.  The history is provided by the patient and the spouse.  Loss of Consciousness He apparently fell at home hitting his head against the wall.  Patient has no memory of the incident.  His wife heard him fall and then later heard him vomiting.  She noted that he had vomited blood with clots.  Patient no longer is complaining of any nausea and he denies hurting anywhere.  He is not on any medication.   History reviewed. No pertinent past medical history.  There are no problems to display for this patient.   Past Surgical History:  Procedure Laterality Date   MANDIBLE FRACTURE SURGERY         No family history on file.  Social History   Tobacco Use   Smoking status: Never   Smokeless tobacco: Never  Vaping Use   Vaping Use: Every day  Substance Use Topics   Alcohol use: Not Currently    Comment: 3-4 times a week.    Drug use: No    Home Medications Prior to Admission medications   Medication Sig Start Date End Date Taking? Authorizing Provider  cyclobenzaprine (FLEXERIL) 10 MG tablet Take 1 tablet (10 mg total) by mouth 2 (two) times daily as needed for muscle spasms. 05/21/17   Fayrene Helper, PA-C  ibuprofen (ADVIL,MOTRIN) 600 MG tablet Take 1 tablet (600 mg total) by mouth every 6 (six) hours as needed. 05/21/17   Fayrene Helper, PA-C    Allergies    Patient has no known allergies.  Review of Systems   Review of Systems  Cardiovascular:  Positive for syncope.  All other systems reviewed and are negative.  Physical Exam Updated Vital Signs BP (!) 150/62 (BP Location: Right Arm)   Pulse 94   Temp 97.7 F (36.5 C) (Oral)   Resp 16   Ht 5\' 9"  (1.753 m)   Wt 88 kg   SpO2 100%   BMI 28.65 kg/m   Physical Exam Vitals and  nursing note reviewed.  35 year old male, resting comfortably and in no acute distress. Vital signs are significant for elevated blood pressure. Oxygen saturation is 100%, which is normal. Head is normocephalic and atraumatic. PERRLA, EOMI. Oropharynx is clear.  Sclerae appear mildly icteric. Neck is nontender without adenopathy or JVD. Back is nontender and there is no CVA tenderness. Lungs are clear without rales, wheezes, or rhonchi. Chest is nontender. Heart has regular rate and rhythm without murmur. Abdomen is soft, flat, nontender without masses or hepatosplenomegaly and peristalsis is normoactive. Rectal: Normal sphincter tone.  Small amount of what appears to be blood-tinged stool present. Extremities have 1-2+ edema, full range of motion is present. Skin is warm and dry without rash. Neurologic: Mental status is normal, cranial nerves are intact, there are no motor or sensory deficits.  ED Results / Procedures / Treatments   Labs (all labs ordered are listed, but only abnormal results are displayed) Labs Reviewed  BASIC METABOLIC PANEL - Abnormal; Notable for the following components:      Result Value   Glucose, Bld 114 (*)    Calcium 7.5 (*)    All other components within normal limits  CBC - Abnormal; Notable for the following components:  RBC 2.11 (*)    Hemoglobin 4.6 (*)    HCT 15.5 (*)    MCV 73.5 (*)    MCH 21.8 (*)    MCHC 29.7 (*)    RDW 19.7 (*)    All other components within normal limits  HEPATIC FUNCTION PANEL - Abnormal; Notable for the following components:   Albumin 2.7 (*)    AST 90 (*)    Total Bilirubin 4.4 (*)    Bilirubin, Direct 2.0 (*)    Indirect Bilirubin 2.4 (*)    All other components within normal limits  OCCULT BLOOD X 1 CARD TO LAB, STOOL - Abnormal; Notable for the following components:   Fecal Occult Bld POSITIVE (*)    All other components within normal limits  CBG MONITORING, ED - Abnormal; Notable for the following components:    Glucose-Capillary 114 (*)    All other components within normal limits  RESP PANEL BY RT-PCR (FLU A&B, COVID) [id number]  URINALYSIS, ROUTINE W REFLEX MICROSCOPIC  PROTIME-INR    EKG EKG Interpretation  Date/Time:  Wednesday April 25 2021 05:35:46 EDT Ventricular Rate:  94 PR Interval:  148 QRS Duration: 93 QT Interval:  395 QTC Calculation: 494 R Axis:   54 Text Interpretation: Sinus rhythm Probable left ventricular hypertrophy Borderline prolonged QT interval No old tracing to compare Confirmed by Dione Booze ([id number]) on 04/25/2021 5:39:12 AM  Radiology CT Head Wo Contrast  Result Date: 04/25/2021 CLINICAL DATA:  Syncopal episodes. EXAM: CT HEAD WITHOUT CONTRAST TECHNIQUE: Contiguous axial images were obtained from the base of the skull through the vertex without intravenous contrast. COMPARISON:  None. FINDINGS: Brain: The ventricles are normal in size and configuration. No extra-axial fluid collections are identified. The gray-white differentiation is maintained. No CT findings for acute hemispheric infarction or intracranial hemorrhage. No mass lesions. The brainstem and cerebellum are normal. Vascular: No hyperdense vessels or obvious aneurysm. Skull: No acute skull fracture. No bone lesion. Sinuses/Orbits: The paranasal sinuses and mastoid air cells are clear except for scattered mucoperiosteal thickening involving the ethmoid air cells. The globes are intact. Other: No scalp lesions or scalp hematoma. IMPRESSION: No acute intracranial findings or mass lesion. Electronically Signed   By: Rudie Meyer M.D.   On: 04/25/2021 06:37    Procedures Procedures  CRITICAL CARE Performed by: Dione Booze Total critical care time: 85 minutes Critical care time was exclusive of separately billable procedures and treating other patients. Critical care was necessary to treat or prevent imminent or life-threatening deterioration. Critical care was time spent personally by me on the following  activities: development of treatment plan with patient and/or surrogate as well as nursing, discussions with consultants, evaluation of patient's response to treatment, examination of patient, obtaining history from patient or surrogate, ordering and performing treatments and interventions, ordering and review of laboratory studies, ordering and review of radiographic studies, pulse oximetry and re-evaluation of patient's condition.  Medications Ordered in ED Medications  lactated ringers bolus 1,000 mL (has no administration in time range)  ondansetron (ZOFRAN) injection 4 mg (has no administration in time range)    ED Course  I have reviewed the triage vital signs and the nursing notes.  Pertinent labs & imaging results that were available during my care of the patient were reviewed by me and considered in my medical decision making (see chart for details).   MDM Rules/Calculators/A&P  Head injury with concussion and retrograde amnesia.  Apparent episode of hematemesis at home.  Patient's wife showed me a picture of what he had vomited and it certainly looks like blood with clots.  In light of icteric sclera, I did ask him about drinking history and history of liver problems and he denies this.  However, on review of old records, he has been evaluated by hepatology at [facility] and notes there states that he had been a fairly heavy drinker in the past.  He states that he has not been drinking for the last several years.  We will check screening labs including INR and send for CT of head.  ECG was obtained showing borderline prolonged QT interval.  CBC shows hemoglobin of 4.6 with slightly low MCV and elevated RDW consistent with iron deficiency anemia.  Patient was asked about melena or hematochezia and denies both.  Rectal exam was done and there is a small amount of what appears to be bright red blood.  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  Also started on pantoprazole.  He will need to be admitted for blood transfusion and evaluation by gastroenterology.  CT of head is unremarkable.  Patient did vomit more blood while in the ED.  He remains hemodynamically stable.  Bilirubin has come back significantly elevated at 4.4, but AST is only mildly elevated.  INR is still pending.  Arrangements are being made for hospital admission.  Final Clinical Impression(s) / ED Diagnoses Final diagnoses:  Upper gastrointestinal bleeding  Fall at home, initial encounter  Anemia due to blood loss  Serum total bilirubin elevated  Elevated AST (SGOT)    Rx / DC Orders ED Discharge Orders     None        Dione Booze, MD 04/25/21 [phone]

## 2021-04-25 NOTE — ED Notes (Signed)
Pt vomiting BRB in trash can.

## 2021-04-25 NOTE — Anesthesia Preprocedure Evaluation (Addendum)
Anesthesia Evaluation  Patient identified by MRN, date of birth, ID band Patient awake, Patient confused and Patient unresponsive    Reviewed: Allergy & Precautions, NPO status , Patient's Chart, lab work & pertinent test results  Airway Mallampati: II  TM Distance: >3 FB Neck ROM: Full    Dental no notable dental hx. (+) Teeth Intact, Dental Advisory Given,    Pulmonary    Pulmonary exam normal breath sounds clear to auscultation       Cardiovascular hypertension, Normal cardiovascular exam Rhythm:Regular Rate:Normal     Neuro/Psych negative neurological ROS  negative psych ROS   GI/Hepatic Neg liver ROS,   Endo/Other  negative endocrine ROS  Renal/GU negative Renal ROS     Musculoskeletal   Abdominal   Peds  Hematology  (+) anemia ,   Anesthesia Other Findings   Reproductive/Obstetrics                           Anesthesia Physical Anesthesia Plan  ASA: 2 and emergent  Anesthesia Plan: MAC   Post-op Pain Management:    Induction:   PONV Risk Score and Plan: Treatment may vary due to age or medical condition  Airway Management Planned: Natural Airway and Nasal Cannula  Additional Equipment: None  Intra-op Plan:   Post-operative Plan:   Informed Consent: I have reviewed the patients History and Physical, chart, labs and discussed the procedure including the risks, benefits and alternatives for the proposed anesthesia with the patient or authorized representative who has indicated his/her understanding and acceptance.     Dental advisory given  Plan Discussed with: CRNA  Anesthesia Plan Comments: (Hematemesis Anemia for EGD)      Anesthesia Quick Evaluation

## 2021-04-25 NOTE — H&P (View-Only) (Signed)
Referring Provider: [facility] Primary Care Physician:  Patient, No Pcp Per (Inactive) Primary Gastroenterologist:  Gentry Fitz  Reason for Consultation:  Upper GI bleeding   HPI: [name] is a 35 y.o. male with past medical history of fatty liver disease presenting for consultation of upper GI bleeding.  Patient states he was in his usual state of health until earlier this morning, when he had a syncopal episode.  Following the syncopal episode, he states he had nausea and vomited food, then proceeded to vomit a large amount of red blood.  He had an additional episode of hematemesis at [facility] ED.  He denies any abdominal pain, melena, or hematochezia.  Denies dysphagia, GERD.  Denies prior history of upper GI bleeding.  He said [facility]) in 2021 for abnormal LFTs, but he did not follow-up.  He has not seen any providers recently.  He does state that he stopped drinking alcohol after his visit with Annamarie Major in 12/2019.  Denies aspirin, NSAID, blood thinner use.  Denies any family history of liver disease.  History reviewed. No pertinent past medical history.  Past Surgical History:  Procedure Laterality Date   MANDIBLE FRACTURE SURGERY      Prior to Admission medications   Medication Sig Start Date End Date Taking? Authorizing Provider  cyclobenzaprine (FLEXERIL) 10 MG tablet Take 1 tablet (10 mg total) by mouth 2 (two) times daily as needed for muscle spasms. 05/21/17   Fayrene Helper, PA-C  ibuprofen (ADVIL,MOTRIN) 600 MG tablet Take 1 tablet (600 mg total) by mouth every 6 (six) hours as needed. 05/21/17   Fayrene Helper, PA-C    Scheduled Meds:  [START ON 04/28/2021] pantoprazole  40 mg Intravenous Q12H   Continuous Infusions:  sodium chloride     sodium chloride     octreotide  (SANDOSTATIN)    IV infusion 50 mcg/hr (04/25/21 0702)   pantoprazole     pantoprazole 8 mg/hr (04/25/21 0732)   PRN Meds:.  Allergies as of 04/25/2021   (No Known Allergies)    No  family history on file.  Social History   Socioeconomic History   Marital status: Single    Spouse name: Not on file   Number of children: Not on file   Years of education: Not on file   Highest education level: Not on file  Occupational History   Not on file  Tobacco Use   Smoking status: Never   Smokeless tobacco: Never  Vaping Use   Vaping Use: Every day  Substance and Sexual Activity   Alcohol use: Not Currently    Comment: 3-4 times a week.    Drug use: No   Sexual activity: Not on file  Other Topics Concern   Not on file  Social History Narrative   Not on file   Social Determinants of Health   Financial Resource Strain: Not on file  Food Insecurity: Not on file  Transportation Needs: Not on file  Physical Activity: Not on file  Stress: Not on file  Social Connections: Not on file  Intimate Partner Violence: Not on file    Review of Systems: Review of Systems  Constitutional:  Positive for malaise/fatigue. Negative for chills and fever.  HENT:  Negative for hearing loss and tinnitus.   Eyes:  Negative for pain and redness.  Respiratory:  Negative for cough and shortness of breath.   Cardiovascular:  Negative for chest pain and palpitations.  Gastrointestinal:  Positive for nausea and vomiting. Negative for  abdominal pain, blood in stool, constipation, diarrhea, heartburn and melena.  Skin:  Negative for itching and rash.  Neurological:  Positive for loss of consciousness. Negative for seizures.  Psychiatric/Behavioral:  Negative for substance abuse. The patient is not nervous/anxious.     Physical Exam: Vital signs: Vitals:   04/25/21 1007 04/25/21 1030  BP: (!) 127/52 (!) 121/49  Pulse: (!) 101 98  Resp: 18 18  Temp:    SpO2: 100% 100%     Physical Exam Vitals reviewed.  Constitutional:      General: He is not in acute distress. HENT:     Head: Normocephalic and atraumatic.     Nose: Nose normal. No congestion.  Eyes:     General: Scleral  icterus present.     Extraocular Movements: Extraocular movements intact.     Comments: Conjunctival pallor  Cardiovascular:     Rate and Rhythm: Regular rhythm. Tachycardia present.     Comments: Mild tachycardia (100-102 BPM) Pulmonary:     Effort: Pulmonary effort is normal. No respiratory distress.  Abdominal:     General: Bowel sounds are normal. There is no distension.     Palpations: Abdomen is soft. There is no mass.     Tenderness: There is no abdominal tenderness. There is no guarding or rebound.     Hernia: No hernia is present.  Musculoskeletal:        General: No tenderness.     Cervical back: Normal range of motion and neck supple.     Right lower leg: Edema present.     Left lower leg: Edema present.  Skin:    General: Skin is warm and dry.     Coloration: Skin is jaundiced.  Neurological:     General: No focal deficit present.     Mental Status: He is alert and oriented to person, place, and time.     Comments: No asterixis  Psychiatric:        Mood and Affect: Mood normal.        Behavior: Behavior normal. Behavior is cooperative.     GI:  Lab Results: Recent Labs    04/25/21 0542 04/25/21 0834  WBC 5.2 5.6  HGB 4.6* 4.0*  HCT 15.5* 12.8*  PLT 153 159   BMET Recent Labs    04/25/21 0542  NA 140  K 3.7  CL 105  CO2 27  GLUCOSE 114*  BUN 12  CREATININE 0.71  CALCIUM 7.5*   LFT Recent Labs    04/25/21 0542  PROT 7.8  ALBUMIN 2.7*  AST 90*  ALT 25  ALKPHOS 73  BILITOT 4.4*  BILIDIR 2.0*  IBILI 2.4*   PT/INR Recent Labs    04/25/21 0542  LABPROT 23.1*  INR 2.1*     Studies/Results: CT Head Wo Contrast  Result Date: 04/25/2021 CLINICAL DATA:  Syncopal episodes. EXAM: CT HEAD WITHOUT CONTRAST TECHNIQUE: Contiguous axial images were obtained from the base of the skull through the vertex without intravenous contrast. COMPARISON:  None. FINDINGS: Brain: The ventricles are normal in size and configuration. No extra-axial fluid  collections are identified. The gray-white differentiation is maintained. No CT findings for acute hemispheric infarction or intracranial hemorrhage. No mass lesions. The brainstem and cerebellum are normal. Vascular: No hyperdense vessels or obvious aneurysm. Skull: No acute skull fracture. No bone lesion. Sinuses/Orbits: The paranasal sinuses and mastoid air cells are clear except for scattered mucoperiosteal thickening involving the ethmoid air cells. The globes are intact. Other: No scalp  lesions or scalp hematoma. IMPRESSION: No acute intracranial findings or mass lesion. Electronically Signed   By: Rudie Meyer M.D.   On: 04/25/2021 06:37    Impression: Severe anemia: Hgb 4.6.  Concern for underlying cirrhosis, given elevated INR and T bili.  Differential includes variceal bleeding versus portal hypertensive gastropathy versus PUD. -Normal BUN (12)/ Cr (0.71)  Suspected underlying liver disease/cirrhosis, history of ETOH use (not currently), fatty liver and abnormal LFTs -INR 2.1 -T. Bili 4.4/ AST 25/ ALT 73/ ALP 73 -Normal platelets  Plan: Continue Protonix and octreotide drips.  Start Rocephin, given GI bleeding with suspected cirrhosis.  Vitamin K x1 ordered.  Plan for EGD tomorrow, when hemoglobin is >7 and INR is <2.  Patient currently has stable BP and heart rate.  I thoroughly discussed procedure with the patient to include nature, alternatives, benefits, and risks (including but not limited to bleeding, infection, perforation, anesthesia/cardiac and pulmonary complications).  Patient verbalized understanding and gave verbal consent to proceed with EGD.  Continue to monitor CBC with transfusion as needed to maintain hemoglobin greater than 7.  [facility] will follow.   LOS: 0 days   Edrick Kins  PA-C 04/25/2021, 10:42 AM  Contact #  [phone]

## 2021-04-25 NOTE — ED Notes (Signed)
Report called to [place], charge RN, [facility].

## 2021-04-26 ENCOUNTER — Encounter (HOSPITAL_COMMUNITY)
Admission: EM | Disposition: A | Discharge status: Home / Self Care | Payer: Self-pay | Source: Home / Self Care | Attending: Internal Medicine

## 2021-04-26 ENCOUNTER — Inpatient Hospital Stay (HOSPITAL_COMMUNITY): Payer: Medicaid Other

## 2021-04-26 ENCOUNTER — Encounter (HOSPITAL_COMMUNITY): Payer: Self-pay | Admitting: Internal Medicine

## 2021-04-26 ENCOUNTER — Inpatient Hospital Stay (HOSPITAL_COMMUNITY): Payer: Medicaid Other | Admitting: Anesthesiology

## 2021-04-26 DIAGNOSIS — D500 Iron deficiency anemia secondary to blood loss (chronic): Secondary | ICD-10-CM

## 2021-04-26 DIAGNOSIS — R7401 Elevation of levels of liver transaminase levels: Secondary | ICD-10-CM

## 2021-04-26 DIAGNOSIS — K2961 Other gastritis with bleeding: Secondary | ICD-10-CM

## 2021-04-26 HISTORY — PX: ESOPHAGOGASTRODUODENOSCOPY: SHX5428

## 2021-04-26 LAB — HEMOGLOBIN AND HEMATOCRIT, BLOOD
HCT: 22.9 % — ABNORMAL LOW (ref 39.0–52.0)
HCT: 22.7 % — ABNORMAL LOW (ref 39.0–52.0)
HCT: 24.5 % — ABNORMAL LOW (ref 39.0–52.0)
HCT: 22.1 % — ABNORMAL LOW (ref 39.0–52.0)
Hemoglobin: 7.2 g/dL — ABNORMAL LOW (ref 13.0–17.0)
Hemoglobin: 7.2 g/dL — ABNORMAL LOW (ref 13.0–17.0)
Hemoglobin: 7.5 g/dL — ABNORMAL LOW (ref 13.0–17.0)
Hemoglobin: 7.2 g/dL — ABNORMAL LOW (ref 13.0–17.0)

## 2021-04-26 LAB — PREPARE FRESH FROZEN PLASMA: Unit division: 0

## 2021-04-26 LAB — CBC WITH DIFFERENTIAL/PLATELET
Abs Immature Granulocytes: 0.03 10*3/uL (ref 0.00–0.07)
Basophils Absolute: 0.1 10*3/uL (ref 0.0–0.1)
Basophils Relative: 1 %
Eosinophils Absolute: 0.1 10*3/uL (ref 0.0–0.5)
Eosinophils Relative: 2 %
HCT: 19.9 % — ABNORMAL LOW (ref 39.0–52.0)
Hemoglobin: 6.1 g/dL — CL (ref 13.0–17.0)
Immature Granulocytes: 1 %
Lymphocytes Relative: 15 %
Lymphs Abs: 0.8 10*3/uL (ref 0.7–4.0)
MCH: 24.6 pg — ABNORMAL LOW (ref 26.0–34.0)
MCHC: 30.7 g/dL (ref 30.0–36.0)
MCV: 80.2 fL (ref 80.0–100.0)
Monocytes Absolute: 0.6 10*3/uL (ref 0.1–1.0)
Monocytes Relative: 12 %
Neutro Abs: 3.6 10*3/uL (ref 1.7–7.7)
Neutrophils Relative %: 69 %
Platelets: 118 10*3/uL — ABNORMAL LOW (ref 150–400)
RBC: 2.48 MIL/uL — ABNORMAL LOW (ref 4.22–5.81)
RDW: 18.9 % — ABNORMAL HIGH (ref 11.5–15.5)
WBC: 5.2 10*3/uL (ref 4.0–10.5)
nRBC: 0 % (ref 0.0–0.2)

## 2021-04-26 LAB — HIV ANTIBODY (ROUTINE TESTING W REFLEX): HIV Screen 4th Generation wRfx: NONREACTIVE

## 2021-04-26 LAB — BPAM FFP
Blood Product Expiration Date: 202208012359
Blood Product Expiration Date: 202208012359
ISSUE DATE / TIME: 202207271402
ISSUE DATE / TIME: 202207271725
Unit Type and Rh: 5100
Unit Type and Rh: 5100

## 2021-04-26 LAB — COMPREHENSIVE METABOLIC PANEL
ALT: 28 U/L (ref 0–44)
AST: 78 U/L — ABNORMAL HIGH (ref 15–41)
Albumin: 2.5 g/dL — ABNORMAL LOW (ref 3.5–5.0)
Alkaline Phosphatase: 63 U/L (ref 38–126)
Anion gap: 8 (ref 5–15)
BUN: 13 mg/dL (ref 6–20)
CO2: 26 mmol/L (ref 22–32)
Calcium: 7.7 mg/dL — ABNORMAL LOW (ref 8.9–10.3)
Chloride: 103 mmol/L (ref 98–111)
Creatinine, Ser: 0.71 mg/dL (ref 0.61–1.24)
GFR, Estimated: >60 mL/min (ref >60)
Glucose, Bld: 135 mg/dL — ABNORMAL HIGH (ref 70–99)
Potassium: 4.2 mmol/L (ref 3.5–5.1)
Sodium: 137 mmol/L (ref 135–145)
Total Bilirubin: 5.2 mg/dL — ABNORMAL HIGH (ref 0.3–1.2)
Total Protein: 7.1 g/dL (ref 6.5–8.1)

## 2021-04-26 LAB — PREPARE RBC (CROSSMATCH)

## 2021-04-26 LAB — PROTIME-INR
INR: 2 — ABNORMAL HIGH (ref 0.8–1.2)
Prothrombin Time: 22.3 seconds — ABNORMAL HIGH (ref 11.4–15.2)

## 2021-04-26 SURGERY — EGD (ESOPHAGOGASTRODUODENOSCOPY)
Anesthesia: Monitor Anesthesia Care

## 2021-04-26 MED ORDER — SODIUM CHLORIDE 0.9 % IV SOLN: INTRAVENOUS | Status: DC

## 2021-04-26 MED ORDER — COVID-19 MRNA VACC (MODERNA) 50 MCG/0.25ML IM SUSP
0.2500 mL | Freq: Once | INTRAMUSCULAR | Status: AC
  Administered 2021-04-27: 0.25 mL via INTRAMUSCULAR
  Filled 2021-04-26: qty 0.25

## 2021-04-26 MED ORDER — LACTATED RINGERS IV SOLN
INTRAVENOUS | Status: AC | PRN
  Administered 2021-04-26: 1000 mL via INTRAVENOUS

## 2021-04-26 MED ORDER — PROPOFOL 500 MG/50ML IV EMUL
INTRAVENOUS | Status: DC | PRN
  Administered 2021-04-26: 200 ug/kg/min via INTRAVENOUS

## 2021-04-26 MED ORDER — SODIUM CHLORIDE 0.9% IV SOLUTION: Freq: Once | INTRAVENOUS | Status: AC

## 2021-04-26 MED ORDER — IOHEXOL 350 MG/ML SOLN
100.0000 mL | Freq: Once | INTRAVENOUS | Status: AC | PRN
  Administered 2021-04-26: 100 mL via INTRAVENOUS

## 2021-04-26 MED ORDER — PROPOFOL 10 MG/ML IV BOLUS
INTRAVENOUS | Status: AC
  Filled 2021-04-26: qty 20

## 2021-04-26 MED ORDER — HYDRALAZINE HCL 20 MG/ML IJ SOLN: 10.0000 mg | Freq: Four times a day (QID) | INTRAMUSCULAR | Status: DC | PRN

## 2021-04-26 MED ORDER — DEXMEDETOMIDINE (PRECEDEX) IN NS 20 MCG/5ML (4 MCG/ML) IV SYRINGE
PREFILLED_SYRINGE | INTRAVENOUS | Status: DC | PRN
  Administered 2021-04-26: 4 ug via INTRAVENOUS
  Administered 2021-04-26 (×2): 8 ug via INTRAVENOUS

## 2021-04-26 MED ORDER — PROPOFOL 500 MG/50ML IV EMUL
INTRAVENOUS | Status: AC
  Filled 2021-04-26: qty 50

## 2021-04-26 NOTE — Op Note (Signed)
[facility] Name: [name] Procedure Date: 04/26/2021 MRN: [id number] Attending MD: Willis ModenaWilliam Hashem Goynes , MD Date of Birth: 06/22/1986 CSN: [id number] Age: 35 Admit Type: Inpatient Procedure:                Upper GI endoscopy Indications:              Acute post hemorrhagic anemia, Hematemesis Providers:                Willis ModenaWilliam Chloe Flis, MD, Susann GivensEdwina Hickman, Estella HuskJarmila Fucs                            RN, RN, Michele McalpineErik Holloway Technician Referring MD:             [facility] Medicines:                Monitored Anesthesia Care Complications:            No immediate complications. Estimated Blood Loss:     Estimated blood loss: none. Procedure:                Pre-Anesthesia Assessment:                           - Prior to the procedure, a History and Physical                            was performed, and patient medications and                            allergies were reviewed. The patient's tolerance of                            previous anesthesia was also reviewed. The risks                            and benefits of the procedure and the sedation                            options and risks were discussed with the patient.                            All questions were answered, and informed consent                            was obtained. Prior Anticoagulants: The patient has                            taken no previous anticoagulant or antiplatelet                            agents. ASA Grade Assessment: III - A patient with                            severe systemic disease. After reviewing the risks  and benefits, the patient was deemed in                            satisfactory condition to undergo the procedure.                           After obtaining informed consent, the endoscope was                            passed under direct vision. Throughout the                            procedure, the patient's blood pressure, pulse,  and                            oxygen saturations were monitored continuously. The                            GIF-[id number] ([id number]) was introduced through the                            mouth, and advanced to the second part of duodenum.                            The upper GI endoscopy was accomplished without                            difficulty. The patient tolerated the procedure                            well. Scope In: Scope Out: Findings:      Grade I varices were found in the lower third of the esophagus. They       were small in size. No stigmata of recent hemorrhage.      The exam of the esophagus was otherwise normal.      Varices with no bleeding were found in the cardia and in the gastric       fundus. They were medium in largest diameter.      Mild portal hypertensive gastropathy was found in the gastric fundus, in       the gastric body and in the gastric antrum.      A few dispersed small erosions with no stigmata of recent bleeding were       found in the gastric body.      The exam of the stomach was otherwise normal.      Diffuse moderately congested mucosa without active bleeding and with no       stigmata of bleeding was found in the duodenal bulb and in the first       portion of the duodenum.      No old or fresh blood was seen to the extent of our examination. Impression:               - Grade I esophageal varices.                           -  Gastric varices, without bleeding.                           - Portal hypertensive gastropathy.                           - Erosive gastropathy with no stigmata of recent                            bleeding.                           - Congested duodenal mucosa.                           - Suspect gastric varices most likely source of                            patient's hematemesis. Moderate Sedation:      None Recommendation:           - Return patient to hospital ward for ongoing care.                           -  Clear liquid diet today.                           - Continue present medications including PPI and                            octreotide.                           - CT scan to clarify portal vasculature + suspected                            varices, with IR consultation to follow.                           [facility] will follow. Procedure Code(s):        --- Professional ---                           [phone], Esophagogastroduodenoscopy, flexible,                            transoral; diagnostic, including collection of                            specimen(s) by brushing or washing, when performed                            (separate procedure) Diagnosis Code(s):        --- Professional ---                           I85.00, Esophageal varices without bleeding  I86.4, Gastric varices                           K76.6, Portal hypertension                           K31.89, Other diseases of stomach and duodenum                           D62, Acute posthemorrhagic anemia                           K92.0, Hematemesis CPT copyright 2019 [employer]. All rights reserved. The codes documented in this report are preliminary and upon coder review may  be revised to meet current compliance requirements. Willis Modena, MD 04/26/2021 11:37:48 AM This report has been signed electronically. Number of Addenda: 0

## 2021-04-26 NOTE — Progress Notes (Signed)
Chief Complaint: Patient was seen in consultation today for GI bleed   Referring Physician(s): Dr. Dulce Sellar  Supervising Physician: Mir, Mauri Reading  Patient Status: [facility] - In-pt  History of Present Illness: [name] is a 35 y.o. male who has been admitted with hematemesis after passing out at home. Has had a couple more episodes since arriving, the last was last pm, RN reports it was about 300 mL. Hgb upon arrival was 4.6, dropping further to 4.0. Has had had PRBC transfusion and now Hgb up to 7.2 this am and again this afternoon. Pt with hx of 'fatty liver' had outpt workup for elevated LFTs. Negative for hepatitis. Also with heavy EtOH history, states quit and has not drank in the last 6 months. States was never told he has chronic anemia, though couldn't find any old CBC in records.  GI eval, underwent EGD today showing small Grade I lower esophageal varices without bleeding. Medium sized gastric cardia and fundus varices noted as well. Again no active bleeding or stigmata of bleeding.  Remainder of exam essentially normal. CT BRTO protocol then ordered and reported by Dr. Loreta Ave.  IR consulted for consideration of BRTO/embolization procedure.  PMHx, meds, labs, imaging, allergies reviewed. Feels a little better. No family at bedside    History reviewed. No pertinent past medical history.  Past Surgical History:  Procedure Laterality Date   MANDIBLE FRACTURE SURGERY      Allergies: Patient has no known allergies.  Medications:  Current Facility-Administered Medications:    0.9 %  sodium chloride infusion, 250 mL, Intravenous, PRN, Willis Modena, MD, Continued from Pre-op at 04/26/21 1047   acetaminophen (TYLENOL) tablet 650 mg, 650 mg, Oral, Q6H PRN **OR** acetaminophen (TYLENOL) suppository 650 mg, 650 mg, Rectal, Q6H PRN, Willis Modena, MD   cefTRIAXone (ROCEPHIN) 2 g in sodium chloride 0.9 % 100 mL IVPB, 2 g, Intravenous, Q24H, Willis Modena, MD,  Stopped at 04/25/21 2321   Chlorhexidine Gluconate Cloth 2 % PADS 6 each, 6 each, Topical, Daily, Willis Modena, MD, 6 each at 04/25/21 1355   [START ON 04/27/2021] COVID-19 mRNA vaccine (Moderna) injection 0.25 mL, 0.25 mL, Intramuscular, ONCE-1600, Willis Modena, MD   hydrALAZINE (APRESOLINE) injection 10 mg, 10 mg, Intravenous, Q6H PRN, Willis Modena, MD   MEDLINE mouth rinse, 15 mL, Mouth Rinse, BID, Willis Modena, MD, 15 mL at 04/25/21 2250   morphine 2 MG/ML injection 1 mg, 1 mg, Intravenous, Q2H PRN, Willis Modena, MD   [COMPLETED] octreotide (SANDOSTATIN) 2 mcg/mL load via infusion 50 mcg, 50 mcg, Intravenous, Once, 50 mcg at 04/25/21 0701 **AND** octreotide (SANDOSTATIN) 500 mcg in sodium chloride 0.9 % 250 mL (2 mcg/mL) infusion, 50 mcg/hr, Intravenous, Continuous, Willis Modena, MD, Last Rate: 25 mL/hr at 04/26/21 0327, 50 mcg/hr at 04/26/21 0327   ondansetron (ZOFRAN) tablet 4 mg, 4 mg, Oral, Q6H PRN **OR** ondansetron (ZOFRAN) injection 4 mg, 4 mg, Intravenous, Q6H PRN, Willis Modena, MD   Melene Muller ON 04/28/2021] pantoprazole (PROTONIX) injection 40 mg, 40 mg, Intravenous, Q12H, Willis Modena, MD   pantoprozole (PROTONIX) 80 mg /NS 100 mL infusion, 8 mg/hr, Intravenous, Continuous, Willis Modena, MD, Last Rate: 10 mL/hr at 04/26/21 0328, 8 mg/hr at 04/26/21 0328   sodium chloride flush (NS) 0.9 % injection 3 mL, 3 mL, Intravenous, Q12H, Willis Modena, MD, 3 mL at 04/25/21 2254   sodium chloride flush (NS) 0.9 % injection 3 mL, 3 mL, Intravenous, PRN, Willis Modena, MD    History reviewed. No pertinent family history.  Social  History   Socioeconomic History   Marital status: Single    Spouse name: Not on file   Number of children: Not on file   Years of education: Not on file   Highest education level: Not on file  Occupational History   Not on file  Tobacco Use   Smoking status: Never   Smokeless tobacco: Never  Vaping Use   Vaping Use: Every day   Substance and Sexual Activity   Alcohol use: Not Currently    Comment: 3-4 times a week.    Drug use: No   Sexual activity: Not on file  Other Topics Concern   Not on file  Social History Narrative   Not on file   Social Determinants of Health   Financial Resource Strain: Not on file  Food Insecurity: Not on file  Transportation Needs: Not on file  Physical Activity: Not on file  Stress: Not on file  Social Connections: Not on file     Review of Systems: A 12 point ROS discussed and pertinent positives are indicated in the HPI above.  All other systems are negative.  Review of Systems  Vital Signs: BP (!) 149/73   Pulse 75   Temp 97.8 F (36.6 C) (Oral)   Resp 14   Ht 5\' 9"  (1.753 m)   Wt 71.7 kg   SpO2 100%   BMI 23.34 kg/m   Physical Exam Constitutional:      General: He is not in acute distress.    Appearance: Normal appearance. He is not ill-appearing.  Cardiovascular:     Rate and Rhythm: Normal rate and regular rhythm.     Heart sounds: Normal heart sounds.  Pulmonary:     Effort: Pulmonary effort is normal. No respiratory distress.     Breath sounds: Normal breath sounds.  Abdominal:     General: Abdomen is flat. There is no distension.     Palpations: Abdomen is soft.     Tenderness: There is no abdominal tenderness.  Neurological:     General: No focal deficit present.     Mental Status: He is alert and oriented to person, place, and time.  Psychiatric:        Mood and Affect: Mood normal.        Thought Content: Thought content normal.        Judgment: Judgment normal.     Imaging: CT Head Wo Contrast  Result Date: 04/25/2021 CLINICAL DATA:  Syncopal episodes. EXAM: CT HEAD WITHOUT CONTRAST TECHNIQUE: Contiguous axial images were obtained from the base of the skull through the vertex without intravenous contrast. COMPARISON:  None. FINDINGS: Brain: The ventricles are normal in size and configuration. No extra-axial fluid collections are  identified. The gray-white differentiation is maintained. No CT findings for acute hemispheric infarction or intracranial hemorrhage. No mass lesions. The brainstem and cerebellum are normal. Vascular: No hyperdense vessels or obvious aneurysm. Skull: No acute skull fracture. No bone lesion. Sinuses/Orbits: The paranasal sinuses and mastoid air cells are clear except for scattered mucoperiosteal thickening involving the ethmoid air cells. The globes are intact. Other: No scalp lesions or scalp hematoma. IMPRESSION: No acute intracranial findings or mass lesion. Electronically Signed   By: 04/27/2021 M.D.   On: 04/25/2021 06:37   04/27/2021 Abdomen Limited  Result Date: 04/25/2021 CLINICAL DATA:  GI bleed EXAM: ULTRASOUND ABDOMEN LIMITED RIGHT UPPER QUADRANT COMPARISON:  None. FINDINGS: Gallbladder: Thickened gallbladder wall measuring 5.3 mm, which is likely related to hepatocellular disease.  There are no visible gallstones. No pericholecystic fluid. Negative sonographic Murphy sign. Common bile duct: Diameter: 4.4 mm Liver: Coarsened liver echogenicity and mildly nodular contours. Reversal of flow in the portal vein. Other: None. IMPRESSION: Coarsened liver echogenicity with mildly nodular contours suggesting hepatic cirrhosis. Reversal of flow in the portal vein consistent with portal hypertension. Electronically Signed   By: Caprice Renshaw   On: 04/25/2021 16:01   CT Angio Abd/Pel w/ and/or w/o  Result Date: 04/26/2021 CLINICAL DATA:  35 year old male with GI bleeding EXAM: CTA ABDOMEN AND PELVIS WITHOUT AND WITH CONTRAST TECHNIQUE: Multidetector CT imaging of the abdomen and pelvis was performed using the standard protocol during bolus administration of intravenous contrast. Multiplanar reconstructed images and MIPs were obtained and reviewed to evaluate the vascular anatomy. CONTRAST:  OMNIPAQUE IOHEXOL 350 MG/ML SOLN COMPARISON:  None. FINDINGS: VASCULAR Aorta: Unremarkable course, caliber, contour of  the abdominal aorta. No dissection, aneurysm, or periaortic fluid. Celiac: Patent, with no significant atherosclerotic changes. SMA: Patent, with no significant atherosclerotic changes. Renals: - Right: Right renal artery patent. - Left: Left renal artery patent. IMA: Inferior mesenteric artery is patent. Right lower extremity: Unremarkable course, caliber, and contour of the right iliac system. No aneurysm, dissection, or occlusion. Hypogastric artery is patent. Common femoral artery patent. Proximal SFA and profunda femoris patent. Left lower extremity: Unremarkable course, caliber, and contour of the left iliac system. No aneurysm, dissection, or occlusion. Hypogastric artery is patent. Common femoral artery patent. Proximal SFA and profunda femoris patent. Venous: Portal venous: Patent portal vein with traditional anatomy at the hepatic hilum. Hepatic veins patent. Splenic vein patent. Superior mesenteric vein and inferior mesenteric vein patent. Delayed CT images demonstrate evidence of esophageal varices at the lower esophagus. Questionable small varices at the gastroesophageal junction, potentially continuous with the esophageal varices. Recanalization of the umbilical vein. Collateral portal to systemic venous shunting through the umbilical vein to the femoral/iliac veins as a minor portal to systemic shunt. The major portal to systemic shunt is from the splenic hilum to the renal system, in a traditional spleno renal shunt. This shunt does not, in this scenario, have a major shunt through the gastric veins as a spleno gastric renal shunt. Systemic venous: Unremarkable appearance of the systemic veins. Review of the MIP images confirms the above findings. NON-VASCULAR Lower chest: Atelectasis at the dependent lung bases. Hepatobiliary: Enlargement of the caudate lobe and the left liver. Questionable early nodularity of the anterior liver surface. Gallbladder somewhat distended with dense material layered  dependently. No significant pericholecystic fluid/inflammation. Pancreas: Unremarkable. Spleen: Spleen diameter measures 14.6 cm on coronal reformatted images. Adrenals/Urinary Tract: - Right adrenal gland: Unremarkable - Left adrenal gland: Unremarkable. - Right kidney: No hydronephrosis, nephrolithiasis, inflammation, or ureteral dilation. No focal lesion. - Left Kidney: No hydronephrosis, nephrolithiasis, inflammation, or ureteral dilation. No focal lesion. - Urinary Bladder: Unremarkable. Stomach/Bowel: - Stomach: Esophageal varices at the distal esophagus with questionable gastric varices at the GE junction, as described above. There are no engorged varices within the body of the stomach that would be compatible with isolated gastric varices. Decompressed stomach. - Small bowel: Unremarkable small bowel. - Appendix: Appendix is not visualized, however, no inflammatory changes are present adjacent to the cecum to indicate an appendicitis. - Colon: Unremarkable. Lymphatic: No mesenteric adenopathy.  No periaortic adenopathy. There are borderline enlarged lymph nodes of the bilateral pelvic nodes on image 128 of series 3. Additionally, bilateral borderline enlarged inguinal lymph nodes, larger on the right than the left.  Mesenteric: Edema within the mesenteric fat.  No ascites. Reproductive: Unremarkable prostate. Other: Fat containing umbilical hernia with recanalized umbilical venous drainage. Frond like enhancing soft tissue within the pubic fat on the right measuring 15 mm on image 148 of series 3. Additionally there is incomplete imaging of enhancing soft tissue in the bilateral inguinal skin folds, appears larger on the right. Musculoskeletal: No evidence of acute fracture. No bony canal narrowing. No significant degenerative changes of the hips. IMPRESSION: CT negative for evidence of acute GI hemorrhage. Stigmata of portal hypertension including early cirrhotic changes, splenomegaly, and multiple  portal-systemic collaterals. The collateral portal systemic drainage include: -uphill esophageal varices -minor pathway through recanalized umbilical vein and the umbilical venous pathway into the pelvis -major pathway via spleno-renal shunting, which does not have an appreciable spleno-gastro-renal component. There are questionable small gastric varices at the gastroesophageal junction, which could potentially represent G0V type 1 or G0V type 2, though difficult to confirm on the CT. **An incidental finding of potential clinical significance has been found. Frond-like, enhancing soft tissue within the bilateral inguinal skin folds, incompletely imaged, with additional lobulated soft tissue measuring 15 mm on the right pubic fat pad. Additionally there are borderline enlarged inguinal and pelvic lymph nodes. This is nonspecific, and may be related to a local HPV infection with reactive lymph nodes, however, skin cancer with lymphatic involvement could also have this appearance. Correlation with physical exam recommended, and consideration of referral for dermatology follow-up.** Signed, Yvone NeuJaime S. Reyne DumasWagner, DO, RPVI [facility] Electronically Signed   By: Gilmer MorJaime  Wagner D.O.   On: 04/26/2021 13:37    Labs:  CBC: Recent Labs    04/25/21 0542 04/25/21 0834 04/26/21 0049 04/26/21 0811 04/26/21 1441  WBC 5.2 5.6 5.2  --   --   HGB 4.6* 4.0* 6.1* 7.2* 7.2*  HCT 15.5* 12.8* 19.9* 22.9* 22.7*  PLT 153 159 118*  --   --     COAGS: Recent Labs    04/25/21 0542 04/26/21 0049  INR 2.1* 2.0*    BMP: Recent Labs    04/25/21 0542 04/26/21 0049  NA 140 137  K 3.7 4.2  CL 105 103  CO2 27 26  GLUCOSE 114* 135*  BUN 12 13  CALCIUM 7.5* 7.7*  CREATININE 0.71 0.71  GFRNONAA >60 >60    LIVER FUNCTION TESTS: Recent Labs    04/25/21 0542 04/26/21 0049  BILITOT 4.4* 5.2*  AST 90* 78*  ALT 25 28  ALKPHOS 73 63  PROT 7.8 7.1  ALBUMIN  2.7* 2.5*    TUMOR MARKERS: No results for input(s): AFPTM, CEA, CA199, CHROMGRNA in the last 8760 hours.  Assessment and Plan: Hematemesis/upper GI bleed Gastric varices suspected as source. Currently hemodynamically stable. No further bleeding events since last pm. Labs reviewed. Hgb stable at 7.2 T Bili 5.2 and INR 2.0 Lytes and renal function normal. Imaging and case reviewed with Dr. Bryn GullingMir and Dr. Loreta AveWagner, recommendations as follows:  No evidence of acute GI hemorrhage. Stigmata of portal HTN and early cirrhotic changes. Esophageal varices noted c/w EGD findings. Questionable small gastric varices on CT, not consistent with EGD findings. No appreciable spleno-gastro-renal shunt component.  Do not feel BRTO/BATO indicated at this time. Optimize medical management. Given findings c/w early decompensated liver dz, as well as portal HTN, if he rebleeds or becomes hemodynamically unstable, he may be a candidate for urgent/emergent TIPS procedure. [facility] as that IR  dept is more equipped to handle emergent case and anesthesia likely more readily available. Discussed with pt possible need for emergent procedure. IR team will continue to follow.     Thank you for this interesting consult.  I greatly enjoyed meeting Tramayne Sebesta and look forward to participating in their care.  A copy of this report was sent to the requesting provider on this date.  Electronically Signed: Brayton El, PA-C 04/26/2021, 3:49 PM   I spent a total of 40 minutes in face to face in clinical consultation, greater than 50% of which was counseling/coordinating care for GI bleed

## 2021-04-26 NOTE — TOC Initial Note (Signed)
Transition of Care [facility]) - Initial/Assessment Note    Patient Details  Name: [name] MRN: [id number] Date of Birth: Jun 24, 1986  Transition of Care [facility]) CM/SW Contact:    Golda Acre, RN Phone Number: 04/26/2021, 7:51 AM  Clinical Narrative:                  35 y.o. male with medical history significant of hypertension who presented after sustaining a syncope episode.  Patient reported to been at his usual state of health until about 3 AM on the day of hospitalization when he remembers getting out of the bed and walking to the bathroom.  The next thing he remembers is laying on the floor, his girlfriend helping him and EMS arriving. Apparently patient sustained a syncope episode while getting into the bathroom, his girlfriend found him pale and clammy, she went to get a cover and when she returned patient had bloody vomitus around him. On direct questioning he does mention remembering hematemesis while on the floor. Denies any abdominal pain, melena or hematochezia.   Denies any NSAID use, alcohol or tobacco abuse. He does acknowledge being under significant psychological stress.     ED Course: He was diagnosed with significant anemia, GI was contacted, 3 units packed red blood cells and 2 units of FFP were ordered.  Referred for further admission, medical treatment and endoscopic procedure.    Review of Systems: 1. General: No fevers, no chills, no weight gain or weight loss 2. ENT: No runny nose or sore throat, no hearing disturbances 3. Pulmonary: No dyspnea, cough, wheezing, or hemoptysis 4. Cardiovascular: No angina, claudication, lower extremity edema, pnd or orthopnea 5. Gastrointestinal: No nausea or vomiting, no diarrhea or constipation, positive hematemesis as mentioned in history present illness. 6. Hematology: No easy bruisability or frequent infections 7. Urology: No dysuria, hematuria or increased urinary frequency 8. Dermatology: No rashes. 9. Neurology: No  seizures or paresthesias 10. Musculoskeletal: No joint pain or deformities  TOC PLAN OF CARE: Will follow for needs and toc plan of care is to return to home with self care. Following for progression.  Expected Discharge Plan: Home/Self Care Barriers to Discharge: Continued Medical Work up   Patient Goals and CMS Choice Patient states their goals for this hospitalization and ongoing recovery are:: to go back home CMS Medicare.gov Compare Post Acute Care list provided to:: Patient Choice offered to / list presented to : Patient  Expected Discharge Plan and Services Expected Discharge Plan: Home/Self Care   Discharge Planning Services: CM Consult   Living arrangements for the past 2 months: Single Family Home                                      Prior Living Arrangements/Services Living arrangements for the past 2 months: Single Family Home Lives with:: Significant Other Patient language and need for interpreter reviewed:: Yes Do you feel safe going back to the place where you live?: Yes      Need for Family Participation in Patient Care: No (Comment) Care giver support system in place?: No (comment)   Criminal Activity/Legal Involvement Pertinent to Current Situation/Hospitalization: No - Comment as needed  Activities of Daily Living   ADL Screening (condition at time of admission) Is the patient deaf or have difficulty hearing?: No Does the patient have difficulty seeing, even when wearing glasses/contacts?: No Does the patient have difficulty concentrating, remembering, or  making decisions?: No Does the patient have difficulty dressing or bathing?: No Does the patient have difficulty walking or climbing stairs?: No  Permission Sought/Granted                  Emotional Assessment Appearance:: Appears stated age Attitude/Demeanor/Rapport: Engaged Affect (typically observed): Calm Orientation: : Oriented to Place, Oriented to Self, Oriented to  Time,  Oriented to Situation Alcohol / Substance Use: Not Applicable Psych Involvement: No (comment)  Admission diagnosis:  Upper gastrointestinal bleeding [K92.2] GI bleeding [K92.2] Anemia due to blood loss [D50.0] Serum total bilirubin elevated [R17] Elevated AST (SGOT) [R74.01] Fall at home, initial encounter [W19.Lorne Skeens, Y92.009] Patient Active Problem List   Diagnosis Date Noted   GI bleeding 04/25/2021   Essential hypertension 04/25/2021   Acute blood loss anemia 04/25/2021   PCP:  Patient, No Pcp Per (Inactive) Pharmacy:   [id number] [place], [place] - [address] [place] [address] Phone: [phone] Fax: [phone]     Social Determinants of Health (SDOH) Interventions    Readmission Risk Interventions No flowsheet data found.

## 2021-04-26 NOTE — Interval H&P Note (Signed)
History and Physical Interval Note:  04/26/2021 10:53 AM  [name]  has presented today for surgery, with the diagnosis of hematemesis, anemia.  The various methods of treatment have been discussed with the patient and family. After consideration of risks, benefits and other options for treatment, the patient has consented to  Procedure(s): ESOPHAGOGASTRODUODENOSCOPY (EGD) (N/A) as a surgical intervention.  The patient's history has been reviewed, patient examined, no change in status, stable for surgery.  I have reviewed the patient's chart and labs.  Questions were answered to the patient's satisfaction.     [name]

## 2021-04-26 NOTE — Progress Notes (Signed)
EGD today revealed gastric varices, suspected to be main source of patient's bleeding. Grade I esophageal varices and hypertensive gastropathy also noted.  CT-A showed questionable small gastric varices at the gastroesophageal junction, which could potentially represent G0V type 1 or G0V type 2, though difficult to confirm on the CT. Negative for evidence of acute GI hemorrhage.  Given significant anemia, consult placed for IR for further evaluation/management (consideration of BRTO/TIPS).  Hospitalist Dr. Isidoro Donning made aware of incidental soft tissue lesion and enlarged lymph nodes seen on CT.  [facility] will follow.

## 2021-04-26 NOTE — Progress Notes (Addendum)
[facility]                                                                              Patient Demographics  [name], is a 35 y.o. male, DOB - [birth year], YIR:[id number]  Admit date - 04/25/2021   Admitting Physician Mauricio Annett Gula, MD  Outpatient Primary MD for the patient is Patient, No Pcp Per (Inactive)  Outpatient specialists:   LOS - 1  days   Medical records reviewed and are as summarized below:    Chief Complaint  Patient presents with   Loss of Consciousness   Hematemesis       Brief summary   Patient is a 35 year old male with hypertension, prior history of heavy alcohol use quit few months ago presented with a syncopal episode.  Patient reported that about 3 AM in the morning of hospitalization, he remembered getting out of the bed and walking to the bathroom.  Next thing he remembered laying on the floor in his girlfriend and EMS was helping him.  Patient was also found to have bloody vomitus around him. In ED, patient was found to have hemoglobin of 4.6, had 2 more episodes of hematemesis in ED and hemoglobin dropped down to 4.0 Patient was admitted to stepdown, GI was consulted.   Assessment & Plan    Principal Problem: Acute blood loss anemia with upper GI bleeding:  -In the setting of prior heavy alcohol use, had quit few months ago.  Not on any NSAIDs or anticoagulation. -Received 3 units packed RBCs, 2 units FFP's, vitamin K 10 mg x 1 -Hemoglobin this morning 7.2, will transfuse 1 more unit of packed RBC -GI consulted, plan for EGD today.  Continue IV octreotide, PPI, ceftriaxone.  Addendum: 4:40PM Called by IR to transfer patient to [facility]. If he re-bleeds will need urgent/emergent TIPS.   Active Problems: Liver cirrhosis, new diagnosis, elevated AST, total bilirubin -In the setting of prior heavy alcoholic use -Ultrasound abdomen showed coarsened liver echogenicity with mildly nodular contour suggesting  hepatic cirrhosis.  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  GI following    Essential hypertension -BP currently elevated, will benefit from propranolol in the setting of portal hypertension, will add once tolerating diet -Currently n.p.o., placed on IV hydralazine as needed with parameters     Code Status: Full code DVT Prophylaxis:  SCDs Start: 04/25/21 1447   Level of Care: Level of care: Stepdown Family Communication: Discussed all imaging results, lab results, explained to the patient    Disposition Plan:     Status is: Inpatient  Remains inpatient appropriate because:Inpatient level of care appropriate due to severity of illness  Dispo: The patient is from: Home              Anticipated d/c is to: Home              Patient currently is not medically stable to d/c.   Difficult to place patient No      Time Spent in minutes    Procedures:  Abdominal ultrasound  Consultants:  GI  Antimicrobials:   Anti-infectives (From admission, onward)    Start     Dose/Rate Route Frequency Ordered Stop   04/25/21 1115  cefTRIAXone (ROCEPHIN) 2 g in sodium chloride 0.9 % 100 mL IVPB        2 g 200 mL/hr over 30 Minutes Intravenous Every 24 hours 04/25/21 1113            Medications  Scheduled Meds:  sodium chloride   Intravenous Once   Chlorhexidine Gluconate Cloth  6 each Topical Daily   mouth rinse  15 mL Mouth Rinse BID   [START ON 04/28/2021] pantoprazole  40 mg Intravenous Q12H   sodium chloride flush  3 mL Intravenous Q12H   Continuous Infusions:  sodium chloride     cefTRIAXone (ROCEPHIN)  IV Stopped (04/25/21 2321)   octreotide  (SANDOSTATIN)    IV infusion 50 mcg/hr (04/26/21 0327)   pantoprazole 8 mg/hr (04/26/21 0328)   PRN Meds:.sodium chloride, acetaminophen **OR** acetaminophen, morphine injection, ondansetron **OR** ondansetron (ZOFRAN) IV, sodium chloride  flush      Subjective:   [name] was seen and examined today.  States no further hematemesis after the 2 episodes in ED.  No abdominal pain.  Patient denies dizziness, chest pain, shortness of breath, abdominal pain, new weakness, numbess, tingling. No acute events overnight.    Objective:   Vitals:   04/26/21 0700 04/26/21 0813 04/26/21 0900 04/26/21 0942  BP: (!) 157/80 (!) 158/67 (!) 158/70   Pulse: 90   79  Resp: 20 18 (!) 9 17  Temp:   98.6 F (37 C) 98.4 F (36.9 C)  TempSrc:   Oral Oral  SpO2: 100% 99%    Weight:      Height:        Intake/Output Summary (Last 24 hours) at 04/26/2021 [id number] Last data filed at 04/26/2021 0800 Gross per 24 hour  Intake 3615.58 ml  Output 1750 ml  Net 1865.58 ml     Wt Readings from Last 3 Encounters:  04/25/21 71.7 kg  05/21/17 116.6 kg     Exam General: Alert and oriented x 3, NAD Cardiovascular: S1 S2 auscultated, no murmurs, RRR Respiratory: Clear to auscultation bilaterally, no wheezing, rales or rhonchi Gastrointestinal: Soft, nontender, nondistended, + bowel sounds Ext: no pedal edema bilaterally Neuro: no new deficits Musculoskeletal: No digital cyanosis, clubbing Skin: No rashes Psych: Normal affect and demeanor, alert and oriented x3    Data Reviewed:  I have personally reviewed following labs and imaging studies  Micro Results Recent Results (from the past 240 hour(s))  Resp Panel by RT-PCR (Flu A&B, Covid) Nasopharyngeal Swab     Status: None   Collection Time: 04/25/21  7:35 AM   Specimen: Nasopharyngeal Swab; Nasopharyngeal(NP) swabs in vial transport medium  Result Value Ref Range Status   SARS Coronavirus 2 by RT PCR NEGATIVE NEGATIVE Final    Comment: (NOTE) SARS-CoV-2 target nucleic acids are NOT DETECTED.  The SARS-CoV-2 RNA is generally detectable in upper respiratory specimens during the acute phase of infection. The lowest concentration of SARS-CoV-2 viral copies this assay can detect  is 138 copies/mL. A negative result does not preclude SARS-Cov-2 infection and should not be used as the sole basis for treatment or other patient management decisions. A negative result may occur with  improper specimen collection/handling, submission of specimen other than nasopharyngeal swab, presence of viral mutation(s) within the areas targeted by this assay, and inadequate number of viral copies(<138 copies/mL). A negative result  must be combined with clinical observations, patient history, and epidemiological information. The expected result is Negative.  Fact Sheet for Patients:  BloggerCourse.com  Fact Sheet for Healthcare Providers:  SeriousBroker.it  This test is no t yet approved or cleared by the [place] FDA and  has been authorized for detection and/or diagnosis of SARS-CoV-2 by FDA under an Emergency Use Authorization (EUA). This EUA will remain  in effect (meaning this test can be used) for the duration of the COVID-19 declaration under Section 564(b)(1) of the Act, 21 U.S.C.section 360bbb-3(b)(1), unless the authorization is terminated  or revoked sooner.       Influenza A by PCR NEGATIVE NEGATIVE Final   Influenza B by PCR NEGATIVE NEGATIVE Final    Comment: (NOTE) The Xpert Xpress SARS-CoV-2/FLU/RSV plus assay is intended as an aid in the diagnosis of influenza from Nasopharyngeal swab specimens and should not be used as a sole basis for treatment. Nasal washings and aspirates are unacceptable for Xpert Xpress SARS-CoV-2/FLU/RSV testing.  Fact Sheet for Patients: BloggerCourse.com  Fact Sheet for Healthcare Providers: SeriousBroker.it  This test is not yet approved or cleared by the [place] FDA and has been authorized for detection and/or diagnosis of SARS-CoV-2 by FDA under an Emergency Use Authorization (EUA). This EUA will remain in effect  (meaning this test can be used) for the duration of the COVID-19 declaration under Section 564(b)(1) of the Act, 21 U.S.C. section 360bbb-3(b)(1), unless the authorization is terminated or revoked.  Performed at [facility], [address] [place], [place] [address]   MRSA Next Gen by PCR, Nasal     Status: None   Collection Time: 04/25/21  1:32 PM   Specimen: Nasal Mucosa; Nasal Swab  Result Value Ref Range Status   MRSA by PCR Next Gen NOT DETECTED NOT DETECTED Final    Comment: (NOTE) The GeneXpert MRSA Assay (FDA approved for NASAL specimens only), is one component of a comprehensive MRSA colonization surveillance program. It is not intended to diagnose MRSA infection nor to guide or monitor treatment for MRSA infections. Test performance is not FDA approved in patients less than 73 years old. Performed at [facility], [address], [place], [place] [address]     Radiology Reports CT Head Wo Contrast  Result Date: 04/25/2021 CLINICAL DATA:  Syncopal episodes. EXAM: CT HEAD WITHOUT CONTRAST TECHNIQUE: Contiguous axial images were obtained from the base of the skull through the vertex without intravenous contrast. COMPARISON:  None. FINDINGS: Brain: The ventricles are normal in size and configuration. No extra-axial fluid collections are identified. The gray-white differentiation is maintained. No CT findings for acute hemispheric infarction or intracranial hemorrhage. No mass lesions. The brainstem and cerebellum are normal. Vascular: No hyperdense vessels or obvious aneurysm. Skull: No acute skull fracture. No bone lesion. Sinuses/Orbits: The paranasal sinuses and mastoid air cells are clear except for scattered mucoperiosteal thickening involving the ethmoid air cells. The globes are intact. Other: No scalp lesions or scalp hematoma. IMPRESSION: No acute intracranial findings or mass lesion. Electronically Signed   By: Rudie Meyer M.D.    On: 04/25/2021 06:37   US Abdomen Limited  Result Date: 04/25/2021 CLINICAL DATA:  GI bleed EXAM: ULTRASOUND ABDOMEN LIMITED RIGHT UPPER QUADRANT COMPARISON:  None. FINDINGS: Gallbladder: Thickened gallbladder wall measuring 5.3 mm, which is likely related to hepatocellular disease. There are no visible gallstones. No pericholecystic fluid. Negative sonographic Murphy sign. Common bile duct: Diameter: 4.4 mm Liver: Coarsened liver echogenicity and mildly nodular  contours. Reversal of flow in the portal vein. Other: None. IMPRESSION: Coarsened liver echogenicity with mildly nodular contours suggesting hepatic cirrhosis. Reversal of flow in the portal vein consistent with portal hypertension. Electronically Signed   By: Caprice Renshaw   On: 04/25/2021 16:01    Lab Data:  CBC: Recent Labs  Lab 04/25/21 0542 04/25/21 0834 04/26/21 0049 04/26/21 0811  WBC 5.2 5.6 5.2  --   NEUTROABS  --   --  3.6  --   HGB 4.6* 4.0* 6.1* 7.2*  HCT 15.5* 12.8* 19.9* 22.9*  MCV 73.5* 71.1* 80.2  --   PLT 153 159 118*  --    Basic Metabolic Panel: Recent Labs  Lab 04/25/21 0542 04/26/21 0049  NA 140 137  K 3.7 4.2  CL 105 103  CO2 27 26  GLUCOSE 114* 135*  BUN 12 13  CREATININE 0.71 0.71  CALCIUM 7.5* 7.7*   GFR: Estimated Creatinine Clearance: 126.4 mL/min (by C-G formula based on SCr of 0.71 mg/dL). Liver Function Tests: Recent Labs  Lab 04/25/21 0542 04/26/21 0049  AST 90* 78*  ALT 25 28  ALKPHOS 73 63  BILITOT 4.4* 5.2*  PROT 7.8 7.1  ALBUMIN 2.7* 2.5*   No results for input(s): LIPASE, AMYLASE in the last 168 hours. No results for input(s): AMMONIA in the last 168 hours. Coagulation Profile: Recent Labs  Lab 04/25/21 0542 04/26/21 0049  INR 2.1* 2.0*   Cardiac Enzymes: No results for input(s): CKTOTAL, CKMB, CKMBINDEX, TROPONINI in the last 168 hours. BNP (last 3 results) No results for input(s): PROBNP in the last 8760 hours. HbA1C: No results for input(s): HGBA1C in the  last 72 hours. CBG: Recent Labs  Lab 04/25/21 0539  GLUCAP 114*   Lipid Profile: No results for input(s): CHOL, HDL, LDLCALC, TRIG, CHOLHDL, LDLDIRECT in the last 72 hours. Thyroid Function Tests: No results for input(s): TSH, T4TOTAL, FREET4, T3FREE, THYROIDAB in the last 72 hours. Anemia Panel: No results for input(s): VITAMINB12, FOLATE, FERRITIN, TIBC, IRON, RETICCTPCT in the last 72 hours. Urine analysis:    Component Value Date/Time   COLORURINE YELLOW 04/25/2021 0740   APPEARANCEUR CLEAR 04/25/2021 0740   LABSPEC 1.021 04/25/2021 0740   PHURINE 7.0 04/25/2021 0740   GLUCOSEU NEGATIVE 04/25/2021 0740   HGBUR NEGATIVE 04/25/2021 0740   BILIRUBINUR NEGATIVE 04/25/2021 0740   KETONESUR NEGATIVE 04/25/2021 0740   PROTEINUR TRACE (A) 04/25/2021 0740   NITRITE NEGATIVE 04/25/2021 0740   LEUKOCYTESUR NEGATIVE 04/25/2021 0740     Jourdin Connors M.D. [facility] 04/26/2021, 9:48 AM  Available via Epic secure chat 7am-7pm After 7 pm, please refer to night coverage provider listed on amion.

## 2021-04-26 NOTE — Progress Notes (Signed)
Pt arrived to room via [facility]. Patient alert and oriented.  Patient placed on heart monitor, vital signs obtained, patient oriented to room, given drink and icee, clear liquid tray ordered.

## 2021-04-26 NOTE — Progress Notes (Addendum)
1715 Pt being transferred to [facility] 4E via [facility]. Report called to Alison Stalling, RN. Pt aware of transfer and agrees. Awaiting [facility].   1745 [facility] here to transport pt. Report given to EMT. Pt has all personal belongings including cell phone, clothing, and charger.

## 2021-04-26 NOTE — Transfer of Care (Signed)
Immediate Anesthesia Transfer of Care Note  Patient: [name]  Procedure(s) Performed: ESOPHAGOGASTRODUODENOSCOPY (EGD)  Patient Location: PACU  Anesthesia Type:MAC  Level of Consciousness: awake, alert  and oriented  Airway & Oxygen Therapy: Patient Spontanous Breathing and Patient connected to face mask oxygen  Post-op Assessment: Report given to RN, Post -op Vital signs reviewed and stable and Patient moving all extremities X 4  Post vital signs: Reviewed and stable  Last Vitals:  Vitals Value Taken Time  BP 141/77   Temp    Pulse 90   Resp 14   SpO2 100     Last Pain:  Vitals:   04/26/21 1049  TempSrc: Oral  PainSc: 0-No pain         Complications: No notable events documented.

## 2021-04-26 NOTE — Anesthesia Postprocedure Evaluation (Signed)
Anesthesia Post Note  Patient: [name]  Procedure(s) Performed: ESOPHAGOGASTRODUODENOSCOPY (EGD)     Patient location during evaluation: Endoscopy Anesthesia Type: MAC Level of consciousness: awake and alert Pain management: pain level controlled Vital Signs Assessment: post-procedure vital signs reviewed and stable Respiratory status: spontaneous breathing, nonlabored ventilation, respiratory function stable and patient connected to nasal cannula oxygen Cardiovascular status: blood pressure returned to baseline and stable Postop Assessment: no apparent nausea or vomiting Anesthetic complications: no   No notable events documented.  Last Vitals:  Vitals:   04/26/21 1146 04/26/21 1200  BP: (!) 154/92 (!) 166/80  Pulse: 76 75  Resp: (!) 22 20  Temp:  36.6 C  SpO2: 100% 100%    Last Pain:  Vitals:   04/26/21 1200  TempSrc: Oral  PainSc: 0-No pain                 Barnet Glasgow

## 2021-04-27 ENCOUNTER — Encounter (HOSPITAL_COMMUNITY): Payer: Self-pay | Admitting: Gastroenterology

## 2021-04-27 LAB — TYPE AND SCREEN
ABO/RH(D): O POS
Antibody Screen: NEGATIVE
Unit division: 0
Unit division: 0
Unit division: 0
Unit division: 0
Unit division: 0

## 2021-04-27 LAB — HEMOGLOBIN AND HEMATOCRIT, BLOOD
HCT: 22.3 % — ABNORMAL LOW (ref 39.0–52.0)
HCT: 22.3 % — ABNORMAL LOW (ref 39.0–52.0)
Hemoglobin: 7.4 g/dL — ABNORMAL LOW (ref 13.0–17.0)
Hemoglobin: 7.3 g/dL — ABNORMAL LOW (ref 13.0–17.0)

## 2021-04-27 LAB — COMPREHENSIVE METABOLIC PANEL
ALT: 24 U/L (ref 0–44)
AST: 67 U/L — ABNORMAL HIGH (ref 15–41)
Albumin: 2.1 g/dL — ABNORMAL LOW (ref 3.5–5.0)
Alkaline Phosphatase: 56 U/L (ref 38–126)
Anion gap: 6 (ref 5–15)
BUN: 10 mg/dL (ref 6–20)
CO2: 26 mmol/L (ref 22–32)
Calcium: 8 mg/dL — ABNORMAL LOW (ref 8.9–10.3)
Chloride: 105 mmol/L (ref 98–111)
Creatinine, Ser: 0.85 mg/dL (ref 0.61–1.24)
GFR, Estimated: >60 mL/min (ref >60)
Glucose, Bld: 98 mg/dL (ref 70–99)
Potassium: 3.5 mmol/L (ref 3.5–5.1)
Sodium: 137 mmol/L (ref 135–145)
Total Bilirubin: 6.8 mg/dL — ABNORMAL HIGH (ref 0.3–1.2)
Total Protein: 6.9 g/dL (ref 6.5–8.1)

## 2021-04-27 LAB — BPAM RBC
Blood Product Expiration Date: 202208272359
Blood Product Expiration Date: 202208292359
Blood Product Expiration Date: 202208292359
Blood Product Expiration Date: 202208302359
Blood Product Expiration Date: 202209012359
ISSUE DATE / TIME: 202207271139
ISSUE DATE / TIME: 202207271556
ISSUE DATE / TIME: 202207272016
ISSUE DATE / TIME: 202207280308
ISSUE DATE / TIME: 202207280927
Unit Type and Rh: 5100
Unit Type and Rh: 5100
Unit Type and Rh: 5100
Unit Type and Rh: 5100
Unit Type and Rh: 5100

## 2021-04-27 NOTE — Progress Notes (Addendum)
[facility] PROGRESS NOTE    [name]  PXT:[id number] DOB: Feb 07, 1986 DOA: 04/25/2021 PCP: Patient, No Pcp Per (Inactive)      Brief Narrative:  This is is a 35 y.o. male patient with a history of hypertension who was admitted on 04/25/2021 after he had hematemesis and a syncopal episode.   History reviewed. No pertinent past medical history.    Assessment & Plan:  Acute blood loss anemia secondary to upper GI bleed Status post EGD showing grade 1 esophageal varices, gastric varices and portal hypertensive gastropathy without stigmata of recent bleeding.  Started on soft diet today.  Did not require two units of packed RBC and 2 units FFP transfusion prior to transfer from [facility]..  On octreotide and Protonix for additional 1 day.  Repeat CBC in a.m.  GI follow-up is appreciated.  TIPS indicated if excessive rebleeding .  History of hypertension. Stable.  History of alcohol abuse Stopped drinking in April 2021.  No signs of alcohol withdrawal.  Liver cirrhosis Noted on abdominal ultrasound.  Likely alcohol induced.         Chlorhexidine Gluconate Cloth  6 each Topical Daily   mouth rinse  15 mL Mouth Rinse BID   [START ON 04/28/2021] pantoprazole  40 mg Intravenous Q12H   sodium chloride flush  3 mL Intravenous Q12H     No outpatient medications have been marked as taking for the 04/25/21 encounter [facility] Encounter).        Disposition: Status is: Inpatient  Remains inpatient appropriate because:IV treatments appropriate due to intensity of illness or inability to take PO    MDM: The below labs and imaging reports were reviewed and summarized above.  Medication management as above.     DVT prophylaxis: SCDs Start: 04/25/21 1447     Consultants:  Interventional radiology, gastroenterology     Subjective: Patient reports that he feels better this morning.  Denies abdominal pain or recurrent  vomiting.  Objective: Lying in bed, in no form of pain or respiratory distress. Vitals:   04/27/21 0300 04/27/21 0840 04/27/21 1133 04/27/21 1558  BP: 138/70 126/75 (!) 154/77 (!) 145/73  Pulse: 77 74 82 73  Resp: 20 20 18 20   Temp: 98.2 F (36.8 C) 98.3 F (36.8 C) 98.2 F (36.8 C) 98.7 F (37.1 C)  TempSrc: Oral Oral Oral Oral  SpO2: 99% 100% 100% 100%  Weight:      Height:        Intake/Output Summary (Last 24 hours) at 04/27/2021 1624 Last data filed at 04/27/2021 1057 Gross per 24 hour  Intake 506.79 ml  Output 2225 ml  Net -1718.21 ml   Filed Weights   04/25/21 0529 04/25/21 1400  Weight: 88 kg 71.7 kg    Examination: General: Not in obvious distress, alert awake . HENT:   No scleral pallor or icterus noted. Oral mucosa is moist.  Chest:   Clear to auscultation bilaterally. No crackles or wheezes.  CVS: S1 &S2 heard.   Regular rate and rhythm. Abdomen: Soft, nontender, nondistended.  Bowel sounds are normoactive. Extremities: No cyanosis, or edema.   Psych: Alert, awake.  Normal mood and affect CNS:  No cranial nerve deficits.  Power equal in all extremities.   Skin: Warm and dry.  No rashes noted.   Data Reviewed: I have personally reviewed following labs and imaging studies:  CBC: Recent Labs  Lab 04/25/21 0542 04/25/21 0834 04/26/21 0049 04/26/21 0811 04/26/21 1441 04/26/21 1628 04/26/21  2028 04/27/21 0601 04/27/21 1440  WBC 5.2 5.6 5.2  --   --   --   --   --   --   NEUTROABS  --   --  3.6  --   --   --   --   --   --   HGB 4.6* 4.0* 6.1*   < > 7.2* 7.5* 7.2* 7.4* 7.3*  HCT 15.5* 12.8* 19.9*   < > 22.7* 24.5* 22.1* 22.3* 22.3*  MCV 73.5* 71.1* 80.2  --   --   --   --   --   --   PLT 153 159 118*  --   --   --   --   --   --    < > = values in this interval not displayed.   Basic Metabolic Panel: Recent Labs  Lab 04/25/21 0542 04/26/21 0049 04/27/21 0601  NA 140 137 137  K 3.7 4.2 3.5  CL 105 103 105  CO2 27 26 26   GLUCOSE 114* 135* 98   BUN 12 13 10   CREATININE 0.71 0.71 0.85  CALCIUM 7.5* 7.7* 8.0*   GFR: Estimated Creatinine Clearance: 119 mL/min (by C-G formula based on SCr of 0.85 mg/dL). Liver Function Tests: Recent Labs  Lab 04/25/21 0542 04/26/21 0049 04/27/21 0601  AST 90* 78* 67*  ALT 25 28 24   ALKPHOS 73 63 56  BILITOT 4.4* 5.2* 6.8*  PROT 7.8 7.1 6.9  ALBUMIN 2.7* 2.5* 2.1*   No results for input(s): LIPASE, AMYLASE in the last 168 hours. No results for input(s): AMMONIA in the last 168 hours. Coagulation Profile: Recent Labs  Lab 04/25/21 0542 04/26/21 0049  INR 2.1* 2.0*   Cardiac Enzymes: No results for input(s): CKTOTAL, CKMB, CKMBINDEX, TROPONINI in the last 168 hours. BNP (last 3 results) No results for input(s): PROBNP in the last 8760 hours. HbA1C: No results for input(s): HGBA1C in the last 72 hours. CBG: Recent Labs  Lab 04/25/21 0539  GLUCAP 114*   Lipid Profile: No results for input(s): CHOL, HDL, LDLCALC, TRIG, CHOLHDL, LDLDIRECT in the last 72 hours. Thyroid Function Tests: No results for input(s): TSH, T4TOTAL, FREET4, T3FREE, THYROIDAB in the last 72 hours. Anemia Panel: No results for input(s): VITAMINB12, FOLATE, FERRITIN, TIBC, IRON, RETICCTPCT in the last 72 hours. Urine analysis:    Component Value Date/Time   COLORURINE YELLOW 04/25/2021 0740   APPEARANCEUR CLEAR 04/25/2021 0740   LABSPEC 1.021 04/25/2021 0740   PHURINE 7.0 04/25/2021 0740   GLUCOSEU NEGATIVE 04/25/2021 0740   HGBUR NEGATIVE 04/25/2021 0740   BILIRUBINUR NEGATIVE 04/25/2021 0740   KETONESUR NEGATIVE 04/25/2021 0740   PROTEINUR TRACE (A) 04/25/2021 0740   NITRITE NEGATIVE 04/25/2021 0740   LEUKOCYTESUR NEGATIVE 04/25/2021 0740   Sepsis Labs: @LABRCNTIP (procalcitonin:4,lacticacidven:4)  ) Recent Results (from the past 240 hour(s))  Resp Panel by RT-PCR (Flu A&B, Covid) Nasopharyngeal Swab     Status: None   Collection Time: 04/25/21  7:35 AM   Specimen: Nasopharyngeal Swab;  Nasopharyngeal(NP) swabs in vial transport medium  Result Value Ref Range Status   SARS Coronavirus 2 by RT PCR NEGATIVE NEGATIVE Final    Comment: (NOTE) SARS-CoV-2 target nucleic acids are NOT DETECTED.  The SARS-CoV-2 RNA is generally detectable in upper respiratory specimens during the acute phase of infection. The lowest concentration of SARS-CoV-2 viral copies this assay can detect is 138 copies/mL. A negative result does not preclude SARS-Cov-2 infection and should not be used as the sole basis  for treatment or other patient management decisions. A negative result may occur with  improper specimen collection/handling, submission of specimen other than nasopharyngeal swab, presence of viral mutation(s) within the areas targeted by this assay, and inadequate number of viral copies(<138 copies/mL). A negative result must be combined with clinical observations, patient history, and epidemiological information. The expected result is Negative.  Fact Sheet for Patients:  BloggerCourse.comhttps://www.fda.gov/media/152166/download  Fact Sheet for Healthcare Providers:  SeriousBroker.ithttps://www.fda.gov/media/152162/download  This test is no t yet approved or cleared by the [place] FDA and  has been authorized for detection and/or diagnosis of SARS-CoV-2 by FDA under an Emergency Use Authorization (EUA). This EUA will remain  in effect (meaning this test can be used) for the duration of the COVID-19 declaration under Section 564(b)(1) of the Act, 21 U.S.C.section 360bbb-3(b)(1), unless the authorization is terminated  or revoked sooner.       Influenza A by PCR NEGATIVE NEGATIVE Final   Influenza B by PCR NEGATIVE NEGATIVE Final    Comment: (NOTE) The Xpert Xpress SARS-CoV-2/FLU/RSV plus assay is intended as an aid in the diagnosis of influenza from Nasopharyngeal swab specimens and should not be used as a sole basis for treatment. Nasal washings and aspirates are unacceptable for Xpert Xpress  SARS-CoV-2/FLU/RSV testing.  Fact Sheet for Patients: BloggerCourse.comhttps://www.fda.gov/media/152166/download  Fact Sheet for Healthcare Providers: SeriousBroker.ithttps://www.fda.gov/media/152162/download  This test is not yet approved or cleared by the [place] FDA and has been authorized for detection and/or diagnosis of SARS-CoV-2 by FDA under an Emergency Use Authorization (EUA). This EUA will remain in effect (meaning this test can be used) for the duration of the COVID-19 declaration under Section 564(b)(1) of the Act, 21 U.S.C. section 360bbb-3(b)(1), unless the authorization is terminated or revoked.  Performed at [facility], [address], [place], [place] [address]   MRSA Next Gen by PCR, Nasal     Status: None   Collection Time: 04/25/21  1:32 PM   Specimen: Nasal Mucosa; Nasal Swab  Result Value Ref Range Status   MRSA by PCR Next Gen NOT DETECTED NOT DETECTED Final    Comment: (NOTE) The GeneXpert MRSA Assay (FDA approved for NASAL specimens only), is one component of a comprehensive MRSA colonization surveillance program. It is not intended to diagnose MRSA infection nor to guide or monitor treatment for MRSA infections. Test performance is not FDA approved in patients less than 37 years old. Performed at [facility], [address] [place], [place] [address]          Radiology Studies: CT Angio Abd/Pel w/ and/or w/o  Result Date: 04/26/2021 CLINICAL DATA:  35 year old male with GI bleeding EXAM: CTA ABDOMEN AND PELVIS WITHOUT AND WITH CONTRAST TECHNIQUE: Multidetector CT imaging of the abdomen and pelvis was performed using the standard protocol during bolus administration of intravenous contrast. Multiplanar reconstructed images and MIPs were obtained and reviewed to evaluate the vascular anatomy. CONTRAST:  100mL OMNIPAQUE IOHEXOL 350 MG/ML SOLN COMPARISON:  None. FINDINGS: VASCULAR Aorta: Unremarkable course, caliber, contour of the  abdominal aorta. No dissection, aneurysm, or periaortic fluid. Celiac: Patent, with no significant atherosclerotic changes. SMA: Patent, with no significant atherosclerotic changes. Renals: - Right: Right renal artery patent. - Left: Left renal artery patent. IMA: Inferior mesenteric artery is patent. Right lower extremity: Unremarkable course, caliber, and contour of the right iliac system. No aneurysm, dissection, or occlusion. Hypogastric artery is patent. Common femoral artery patent. Proximal SFA and profunda femoris patent. Left lower extremity: Unremarkable course, caliber,  and contour of the left iliac system. No aneurysm, dissection, or occlusion. Hypogastric artery is patent. Common femoral artery patent. Proximal SFA and profunda femoris patent. Venous: Portal venous: Patent portal vein with traditional anatomy at the hepatic hilum. Hepatic veins patent. Splenic vein patent. Superior mesenteric vein and inferior mesenteric vein patent. Delayed CT images demonstrate evidence of esophageal varices at the lower esophagus. Questionable small varices at the gastroesophageal junction, potentially continuous with the esophageal varices. Recanalization of the umbilical vein. Collateral portal to systemic venous shunting through the umbilical vein to the femoral/iliac veins as a minor portal to systemic shunt. The major portal to systemic shunt is from the splenic hilum to the renal system, in a traditional spleno renal shunt. This shunt does not, in this scenario, have a major shunt through the gastric veins as a spleno gastric renal shunt. Systemic venous: Unremarkable appearance of the systemic veins. Review of the MIP images confirms the above findings. NON-VASCULAR Lower chest: Atelectasis at the dependent lung bases. Hepatobiliary: Enlargement of the caudate lobe and the left liver. Questionable early nodularity of the anterior liver surface. Gallbladder somewhat distended with dense material layered  dependently. No significant pericholecystic fluid/inflammation. Pancreas: Unremarkable. Spleen: Spleen diameter measures 14.6 cm on coronal reformatted images. Adrenals/Urinary Tract: - Right adrenal gland: Unremarkable - Left adrenal gland: Unremarkable. - Right kidney: No hydronephrosis, nephrolithiasis, inflammation, or ureteral dilation. No focal lesion. - Left Kidney: No hydronephrosis, nephrolithiasis, inflammation, or ureteral dilation. No focal lesion. - Urinary Bladder: Unremarkable. Stomach/Bowel: - Stomach: Esophageal varices at the distal esophagus with questionable gastric varices at the GE junction, as described above. There are no engorged varices within the body of the stomach that would be compatible with isolated gastric varices. Decompressed stomach. - Small bowel: Unremarkable small bowel. - Appendix: Appendix is not visualized, however, no inflammatory changes are present adjacent to the cecum to indicate an appendicitis. - Colon: Unremarkable. Lymphatic: No mesenteric adenopathy.  No periaortic adenopathy. There are borderline enlarged lymph nodes of the bilateral pelvic nodes on image 128 of series 3. Additionally, bilateral borderline enlarged inguinal lymph nodes, larger on the right than the left. Mesenteric: Edema within the mesenteric fat.  No ascites. Reproductive: Unremarkable prostate. Other: Fat containing umbilical hernia with recanalized umbilical venous drainage. Frond like enhancing soft tissue within the pubic fat on the right measuring 15 mm on image 148 of series 3. Additionally there is incomplete imaging of enhancing soft tissue in the bilateral inguinal skin folds, appears larger on the right. Musculoskeletal: No evidence of acute fracture. No bony canal narrowing. No significant degenerative changes of the hips. IMPRESSION: CT negative for evidence of acute GI hemorrhage. Stigmata of portal hypertension including early cirrhotic changes, splenomegaly, and multiple  portal-systemic collaterals. The collateral portal systemic drainage include: -uphill esophageal varices -minor pathway through recanalized umbilical vein and the umbilical venous pathway into the pelvis -major pathway via spleno-renal shunting, which does not have an appreciable spleno-gastro-renal component. There are questionable small gastric varices at the gastroesophageal junction, which could potentially represent G0V type 1 or G0V type 2, though difficult to confirm on the CT. **An incidental finding of potential clinical significance has been found. Frond-like, enhancing soft tissue within the bilateral inguinal skin folds, incompletely imaged, with additional lobulated soft tissue measuring 15 mm on the right pubic fat pad. Additionally there are borderline enlarged inguinal and pelvic lymph nodes. This is nonspecific, and may be related to a local HPV infection with reactive lymph nodes, however, skin cancer with lymphatic  involvement could also have this appearance. Correlation with physical exam recommended, and consideration of referral for dermatology follow-up.** Signed, Yvone Neu. Reyne Dumas, RPVI [facility] Electronically Signed   By: Gilmer Mor D.O.   On: 04/26/2021 13:37        Scheduled Meds:  Chlorhexidine Gluconate Cloth  6 each Topical Daily   mouth rinse  15 mL Mouth Rinse BID   [START ON 04/28/2021] pantoprazole  40 mg Intravenous Q12H   sodium chloride flush  3 mL Intravenous Q12H   Continuous Infusions:  sodium chloride     cefTRIAXone (ROCEPHIN)  IV 2 g (04/26/21 2207)   octreotide  (SANDOSTATIN)    IV infusion 50 mcg/hr (04/27/21 1541)   pantoprazole 8 mg/hr (04/27/21 1605)     LOS: 2 days    Time spent: 35 minutes.    Kawika Bischoff Arvella Merles, MD [facility] 04/27/2021, 4:24 PM     Please page though AMION or Epic secure chat:  For [employer], Higher education careers adviser

## 2021-04-27 NOTE — Progress Notes (Signed)
Subjective: No further hematemesis. No abdominal pain or blood in stool.  Objective: Vital signs in last 24 hours: Temp:  [98.2 F (36.8 C)-98.3 F (36.8 C)] 98.2 F (36.8 C) (07/29 1133) Pulse Rate:  [63-82] 82 (07/29 1133) Resp:  [18-20] 18 (07/29 1133) BP: (126-154)/(64-81) 154/77 (07/29 1133) SpO2:  [98 %-100 %] 100 % (07/29 1133) Weight change:  Last BM Date: 04/26/21  PE: GEN:  NAD HEENT:  Scleral icterus  Lab Results: CBC    Component Value Date/Time   WBC 5.2 04/26/2021 0049   RBC 2.48 (L) 04/26/2021 0049   HGB 7.4 (L) 04/27/2021 0601   HCT 22.3 (L) 04/27/2021 0601   PLT 118 (L) 04/26/2021 0049   MCV 80.2 04/26/2021 0049   MCH 24.6 (L) 04/26/2021 0049   MCHC 30.7 04/26/2021 0049   RDW 18.9 (H) 04/26/2021 0049   LYMPHSABS 0.8 04/26/2021 0049   MONOABS 0.6 04/26/2021 0049   EOSABS 0.1 04/26/2021 0049   BASOSABS 0.1 04/26/2021 0049  CMP     Component Value Date/Time   NA 137 04/27/2021 0601   K 3.5 04/27/2021 0601   CL 105 04/27/2021 0601   CO2 26 04/27/2021 0601   GLUCOSE 98 04/27/2021 0601   BUN 10 04/27/2021 0601   CREATININE 0.85 04/27/2021 0601   CALCIUM 8.0 (L) 04/27/2021 0601   PROT 6.9 04/27/2021 0601   ALBUMIN 2.1 (L) 04/27/2021 0601   AST 67 (H) 04/27/2021 0601   ALT 24 04/27/2021 0601   ALKPHOS 56 04/27/2021 0601   BILITOT 6.8 (H) 04/27/2021 0601   GFRNONAA >60 04/27/2021 0601   Assessment:   Alcohol liver disease (cirrhosis, complicated by portal hypertension and gastric > esophageal varices).  Plan:  Soft diet today. 2.  Octreotide/PPI gtt x one more day, then transition to po. 3.  TIPS if rampant rebleeding. 4.  [facility] will follow.   [name] 04/27/2021, 2:23 PM   Cell [phone] If no answer or after 5 PM call [phone]

## 2021-04-27 NOTE — Progress Notes (Signed)
IR chart check:  No further hematemesis. Hemodynamically stable Hgb trending up, now 7.4 GI recs noted.  IR remains following and available for TIPS if significant recurrent bleeding.  Brayton El PA-C Interventional Radiology 04/27/2021 2:40 PM

## 2021-04-28 LAB — BASIC METABOLIC PANEL
Anion gap: 6 (ref 5–15)
BUN: 7 mg/dL (ref 6–20)
CO2: 26 mmol/L (ref 22–32)
Calcium: 7.7 mg/dL — ABNORMAL LOW (ref 8.9–10.3)
Chloride: 102 mmol/L (ref 98–111)
Creatinine, Ser: 0.84 mg/dL (ref 0.61–1.24)
GFR, Estimated: >60 mL/min (ref >60)
Glucose, Bld: 96 mg/dL (ref 70–99)
Potassium: 3.3 mmol/L — ABNORMAL LOW (ref 3.5–5.1)
Sodium: 134 mmol/L — ABNORMAL LOW (ref 135–145)

## 2021-04-28 LAB — CBC
HCT: 22.1 % — ABNORMAL LOW (ref 39.0–52.0)
Hemoglobin: 7.1 g/dL — ABNORMAL LOW (ref 13.0–17.0)
MCH: 25.6 pg — ABNORMAL LOW (ref 26.0–34.0)
MCHC: 32.1 g/dL (ref 30.0–36.0)
MCV: 79.8 fL — ABNORMAL LOW (ref 80.0–100.0)
Platelets: 122 10*3/uL — ABNORMAL LOW (ref 150–400)
RBC: 2.77 MIL/uL — ABNORMAL LOW (ref 4.22–5.81)
RDW: 18.9 % — ABNORMAL HIGH (ref 11.5–15.5)
WBC: 6.5 10*3/uL (ref 4.0–10.5)
nRBC: 0.3 % — ABNORMAL HIGH (ref 0.0–0.2)

## 2021-04-28 MED ORDER — FERROUS SULFATE 325 (65 FE) MG PO TABS: 325.0000 mg | ORAL_TABLET | Freq: Every day | ORAL | 3 refills | Status: DC

## 2021-04-28 MED ORDER — PANTOPRAZOLE SODIUM 40 MG PO TBEC: 40.0000 mg | DELAYED_RELEASE_TABLET | Freq: Every day | ORAL | 1 refills | Status: DC

## 2021-04-28 MED ORDER — POTASSIUM CHLORIDE CRYS ER 20 MEQ PO TBCR
40.0000 meq | EXTENDED_RELEASE_TABLET | Freq: Once | ORAL | Status: AC
  Administered 2021-04-28: 40 meq via ORAL
  Filled 2021-04-28: qty 2

## 2021-04-28 NOTE — Discharge Summary (Signed)
Physician Discharge Summary  Chelsey Kimberley VOZ:[id number] DOB: 1986/01/18 DOA: 04/25/2021  PCP: Patient, No Pcp Per (Inactive)  Admit date: 04/25/2021 Discharge date: 04/28/2021   Recommendations for Outpatient Follow-up:  Follow up with PCP in 7 to 10 days   Discharge Condition: Stable Diet recommendation: Regular  Brief/Interim Summary: This is is a 35 y.o. male patient with a history of hypertension who was admitted on 04/25/2021 after he had hematemesis and a syncopal episode.  Acute blood loss anemia secondary to upper GI bleed Status post EGD showed grade 1 esophageal varices, gastric varices and portal hypertensive gastropathy without stigmata of recent bleeding.   Did  require two units of packed RBC and 2 units FFP transfusion prior to transfer from [facility] hospital..  Was managed with octreotide and Protonix during this hospitalization.  H&H remained stable.  Continue with oral Protonix.  Acute blood loss anemia Related to GI bleeding.  H&H is stable.  Keep him on iron supplements.  History of hypertension. Stable.   History of alcohol abuse Stopped drinking in April 2021.  No signs of alcohol withdrawal.   Liver cirrhosis Noted on abdominal ultrasound.  Likely alcohol induced.  Discharge Diagnoses:  Principal Problem:   GI bleeding Active Problems:   Essential hypertension   Acute blood loss anemia    Discharge Instructions  Discharge Instructions     Call MD for:  persistant nausea and vomiting   Complete by: As directed    Or bloody vomiting, blood in stool or dark stools.   Call MD for:  severe uncontrolled pain   Complete by: As directed    Diet - low sodium heart healthy   Complete by: As directed    Increase activity slowly   Complete by: As directed       Allergies as of 04/28/2021   No Known Allergies      Medication List     TAKE these medications    ferrous sulfate 325 (65 FE) MG tablet Take 1 tablet (325 mg total) by mouth  daily.   pantoprazole 40 MG tablet Commonly known as: Protonix Take 1 tablet (40 mg total) by mouth daily.        No Known Allergies  Consultations: GI   Procedures/Studies: CT Head Wo Contrast  Result Date: 04/25/2021 CLINICAL DATA:  Syncopal episodes. EXAM: CT HEAD WITHOUT CONTRAST TECHNIQUE: Contiguous axial images were obtained from the base of the skull through the vertex without intravenous contrast. COMPARISON:  None. FINDINGS: Brain: The ventricles are normal in size and configuration. No extra-axial fluid collections are identified. The gray-white differentiation is maintained. No CT findings for acute hemispheric infarction or intracranial hemorrhage. No mass lesions. The brainstem and cerebellum are normal. Vascular: No hyperdense vessels or obvious aneurysm. Skull: No acute skull fracture. No bone lesion. Sinuses/Orbits: The paranasal sinuses and mastoid air cells are clear except for scattered mucoperiosteal thickening involving the ethmoid air cells. The globes are intact. Other: No scalp lesions or scalp hematoma. IMPRESSION: No acute intracranial findings or mass lesion. Electronically Signed   By: Rudie Meyer M.D.   On: 04/25/2021 06:37   US Abdomen Limited  Result Date: 04/25/2021 CLINICAL DATA:  GI bleed EXAM: ULTRASOUND ABDOMEN LIMITED RIGHT UPPER QUADRANT COMPARISON:  None. FINDINGS: Gallbladder: Thickened gallbladder wall measuring 5.3 mm, which is likely related to hepatocellular disease. There are no visible gallstones. No pericholecystic fluid. Negative sonographic Murphy sign. Common bile duct: Diameter: 4.4 mm Liver: Coarsened liver echogenicity and mildly nodular contours. Reversal  of flow in the portal vein. Other: None. IMPRESSION: Coarsened liver echogenicity with mildly nodular contours suggesting hepatic cirrhosis. Reversal of flow in the portal vein consistent with portal hypertension. Electronically Signed   By: Caprice Renshaw   On: 04/25/2021 16:01   CT  Angio Abd/Pel w/ and/or w/o  Result Date: 04/26/2021 CLINICAL DATA:  35 year old male with GI bleeding EXAM: CTA ABDOMEN AND PELVIS WITHOUT AND WITH CONTRAST TECHNIQUE: Multidetector CT imaging of the abdomen and pelvis was performed using the standard protocol during bolus administration of intravenous contrast. Multiplanar reconstructed images and MIPs were obtained and reviewed to evaluate the vascular anatomy. CONTRAST:  OMNIPAQUE IOHEXOL 350 MG/ML SOLN COMPARISON:  None. FINDINGS: VASCULAR Aorta: Unremarkable course, caliber, contour of the abdominal aorta. No dissection, aneurysm, or periaortic fluid. Celiac: Patent, with no significant atherosclerotic changes. SMA: Patent, with no significant atherosclerotic changes. Renals: - Right: Right renal artery patent. - Left: Left renal artery patent. IMA: Inferior mesenteric artery is patent. Right lower extremity: Unremarkable course, caliber, and contour of the right iliac system. No aneurysm, dissection, or occlusion. Hypogastric artery is patent. Common femoral artery patent. Proximal SFA and profunda femoris patent. Left lower extremity: Unremarkable course, caliber, and contour of the left iliac system. No aneurysm, dissection, or occlusion. Hypogastric artery is patent. Common femoral artery patent. Proximal SFA and profunda femoris patent. Venous: Portal venous: Patent portal vein with traditional anatomy at the hepatic hilum. Hepatic veins patent. Splenic vein patent. Superior mesenteric vein and inferior mesenteric vein patent. Delayed CT images demonstrate evidence of esophageal varices at the lower esophagus. Questionable small varices at the gastroesophageal junction, potentially continuous with the esophageal varices. Recanalization of the umbilical vein. Collateral portal to systemic venous shunting through the umbilical vein to the femoral/iliac veins as a minor portal to systemic shunt. The major portal to systemic shunt is from the splenic  hilum to the renal system, in a traditional spleno renal shunt. This shunt does not, in this scenario, have a major shunt through the gastric veins as a spleno gastric renal shunt. Systemic venous: Unremarkable appearance of the systemic veins. Review of the MIP images confirms the above findings. NON-VASCULAR Lower chest: Atelectasis at the dependent lung bases. Hepatobiliary: Enlargement of the caudate lobe and the left liver. Questionable early nodularity of the anterior liver surface. Gallbladder somewhat distended with dense material layered dependently. No significant pericholecystic fluid/inflammation. Pancreas: Unremarkable. Spleen: Spleen diameter measures 14.6 cm on coronal reformatted images. Adrenals/Urinary Tract: - Right adrenal gland: Unremarkable - Left adrenal gland: Unremarkable. - Right kidney: No hydronephrosis, nephrolithiasis, inflammation, or ureteral dilation. No focal lesion. - Left Kidney: No hydronephrosis, nephrolithiasis, inflammation, or ureteral dilation. No focal lesion. - Urinary Bladder: Unremarkable. Stomach/Bowel: - Stomach: Esophageal varices at the distal esophagus with questionable gastric varices at the GE junction, as described above. There are no engorged varices within the body of the stomach that would be compatible with isolated gastric varices. Decompressed stomach. - Small bowel: Unremarkable small bowel. - Appendix: Appendix is not visualized, however, no inflammatory changes are present adjacent to the cecum to indicate an appendicitis. - Colon: Unremarkable. Lymphatic: No mesenteric adenopathy.  No periaortic adenopathy. There are borderline enlarged lymph nodes of the bilateral pelvic nodes on image 128 of series 3. Additionally, bilateral borderline enlarged inguinal lymph nodes, larger on the right than the left. Mesenteric: Edema within the mesenteric fat.  No ascites. Reproductive: Unremarkable prostate. Other: Fat containing umbilical hernia with recanalized  umbilical venous drainage. Frond like enhancing soft  tissue within the pubic fat on the right measuring 15 mm on image 148 of series 3. Additionally there is incomplete imaging of enhancing soft tissue in the bilateral inguinal skin folds, appears larger on the right. Musculoskeletal: No evidence of acute fracture. No bony canal narrowing. No significant degenerative changes of the hips. IMPRESSION: CT negative for evidence of acute GI hemorrhage. Stigmata of portal hypertension including early cirrhotic changes, splenomegaly, and multiple portal-systemic collaterals. The collateral portal systemic drainage include: -uphill esophageal varices -minor pathway through recanalized umbilical vein and the umbilical venous pathway into the pelvis -major pathway via spleno-renal shunting, which does not have an appreciable spleno-gastro-renal component. There are questionable small gastric varices at the gastroesophageal junction, which could potentially represent G0V type 1 or G0V type 2, though difficult to confirm on the CT. **An incidental finding of potential clinical significance has been found. Frond-like, enhancing soft tissue within the bilateral inguinal skin folds, incompletely imaged, with additional lobulated soft tissue measuring 15 mm on the right pubic fat pad. Additionally there are borderline enlarged inguinal and pelvic lymph nodes. This is nonspecific, and may be related to a local HPV infection with reactive lymph nodes, however, skin cancer with lymphatic involvement could also have this appearance. Correlation with physical exam recommended, and consideration of referral for dermatology follow-up.** Signed, Yvone Neu. Reyne Dumas, RPVI [facility] Radiology Electronically Signed   By: Gilmer Mor D.O.   On: 04/26/2021 13:37      Subjective: Patient reports that she feels fine.  Tolerating diet.   Discharge Exam: Lying in bed, in no form of pain or  respiratory distress. Vitals:   04/28/21 0810 04/28/21 1141  BP: (!) 159/75 (!) 142/84  Pulse: 100 (!) 101  Resp: (!) 21 16  Temp: 98.4 F (36.9 C) 99.4 F (37.4 C)  SpO2: 100% 100%   Vitals:   04/28/21 0016 04/28/21 0414 04/28/21 0810 04/28/21 1141  BP: 140/67 (!) 145/81 (!) 159/75 (!) 142/84  Pulse: 70 95 100 (!) 101  Resp: 20 19 (!) 21 16  Temp: 98.5 F (36.9 C) 98.9 F (37.2 C) 98.4 F (36.9 C) 99.4 F (37.4 C)  TempSrc: Oral Oral Oral Oral  SpO2: 100% 99% 100% 100%  Weight:      Height:        General: Pt is alert, awake, not in acute distress Cardiovascular: RRR, S1/S2 +, no rubs, no gallops Respiratory: CTA bilaterally, no wheezing, no rhonchi Abdominal: Soft, NT, ND, bowel sounds + Extremities: no edema, no cyanosis    The results of significant diagnostics from this hospitalization (including imaging, microbiology, ancillary and laboratory) are listed below for reference.     Microbiology: Recent Results (from the past 240 hour(s))  Resp Panel by RT-PCR (Flu A&B, Covid) Nasopharyngeal Swab     Status: None   Collection Time: 04/25/21  7:35 AM   Specimen: Nasopharyngeal Swab; Nasopharyngeal(NP) swabs in vial transport medium  Result Value Ref Range Status   SARS Coronavirus 2 by RT PCR NEGATIVE NEGATIVE Final    Comment: (NOTE) SARS-CoV-2 target nucleic acids are NOT DETECTED.  The SARS-CoV-2 RNA is generally detectable in upper respiratory specimens during the acute phase of infection. The lowest concentration of SARS-CoV-2 viral copies this assay can detect is 138 copies/mL. A negative result does not preclude SARS-Cov-2 infection and should not be used as the sole basis for treatment or other patient management decisions. A negative result may occur with  improper specimen collection/handling,  submission of specimen other than nasopharyngeal swab, presence of viral mutation(s) within the areas targeted by this assay, and inadequate number of  viral copies(<138 copies/mL). A negative result must be combined with clinical observations, patient history, and epidemiological information. The expected result is Negative.  Fact Sheet for Patients:  BloggerCourse.comhttps://www.fda.gov/media/152166/download  Fact Sheet for Healthcare Providers:  SeriousBroker.ithttps://www.fda.gov/media/152162/download  This test is no t yet approved or cleared by the [place] FDA and  has been authorized for detection and/or diagnosis of SARS-CoV-2 by FDA under an Emergency Use Authorization (EUA). This EUA will remain  in effect (meaning this test can be used) for the duration of the COVID-19 declaration under Section 564(b)(1) of the Act, 21 U.S.C.section 360bbb-3(b)(1), unless the authorization is terminated  or revoked sooner.       Influenza A by PCR NEGATIVE NEGATIVE Final   Influenza B by PCR NEGATIVE NEGATIVE Final    Comment: (NOTE) The Xpert Xpress SARS-CoV-2/FLU/RSV plus assay is intended as an aid in the diagnosis of influenza from Nasopharyngeal swab specimens and should not be used as a sole basis for treatment. Nasal washings and aspirates are unacceptable for Xpert Xpress SARS-CoV-2/FLU/RSV testing.  Fact Sheet for Patients: BloggerCourse.comhttps://www.fda.gov/media/152166/download  Fact Sheet for Healthcare Providers: SeriousBroker.ithttps://www.fda.gov/media/152162/download  This test is not yet approved or cleared by the [place] FDA and has been authorized for detection and/or diagnosis of SARS-CoV-2 by FDA under an Emergency Use Authorization (EUA). This EUA will remain in effect (meaning this test can be used) for the duration of the COVID-19 declaration under Section 564(b)(1) of the Act, 21 U.S.C. section 360bbb-3(b)(1), unless the authorization is terminated or revoked.  Performed at [facility], [address], [place], [place] [address]   MRSA Next Gen by PCR, Nasal     Status: None   Collection Time: 04/25/21  1:32 PM   Specimen: Nasal  Mucosa; Nasal Swab  Result Value Ref Range Status   MRSA by PCR Next Gen NOT DETECTED NOT DETECTED Final    Comment: (NOTE) The GeneXpert MRSA Assay (FDA approved for NASAL specimens only), is one component of a comprehensive MRSA colonization surveillance program. It is not intended to diagnose MRSA infection nor to guide or monitor treatment for MRSA infections. Test performance is not FDA approved in patients less than 37 years old. Performed at [facility], [address] [place], [place] [address]      Labs: BNP (last 3 results) No results for input(s): BNP in the last 8760 hours. Basic Metabolic Panel: Recent Labs  Lab 04/25/21 0542 04/26/21 0049 04/27/21 0601 04/28/21 0053  NA 140 137 137 134*  K 3.7 4.2 3.5 3.3*  CL 105 103 105 102  CO2 27 26 26 26   GLUCOSE 114* 135* 98 96  BUN 12 13 10 7   CREATININE 0.71 0.71 0.85 0.84  CALCIUM 7.5* 7.7* 8.0* 7.7*   Liver Function Tests: Recent Labs  Lab 04/25/21 0542 04/26/21 0049 04/27/21 0601  AST 90* 78* 67*  ALT 25 28 24   ALKPHOS 73 63 56  BILITOT 4.4* 5.2* 6.8*  PROT 7.8 7.1 6.9  ALBUMIN 2.7* 2.5* 2.1*   No results for input(s): LIPASE, AMYLASE in the last 168 hours. No results for input(s): AMMONIA in the last 168 hours. CBC: Recent Labs  Lab 04/25/21 0542 04/25/21 [id number] 04/26/21 0049 04/26/21 0811 04/26/21 1628 04/26/21 2028 04/27/21 0601 04/27/21 1440 04/28/21 0053  WBC 5.2 5.6 5.2  --   --   --   --   --  6.5  NEUTROABS  --   --  3.6  --   --   --   --   --   --   HGB 4.6* 4.0* 6.1*   < > 7.5* 7.2* 7.4* 7.3* 7.1*  HCT 15.5* 12.8* 19.9*   < > 24.5* 22.1* 22.3* 22.3* 22.1*  MCV 73.5* 71.1* 80.2  --   --   --   --   --  79.8*  PLT 153 159 118*  --   --   --   --   --  122*   < > = values in this interval not displayed.   Cardiac Enzymes: No results for input(s): CKTOTAL, CKMB, CKMBINDEX, TROPONINI in the last 168 hours. BNP: Invalid input(s): POCBNP CBG: Recent Labs  Lab  04/25/21 0539  GLUCAP 114*   D-Dimer No results for input(s): DDIMER in the last 72 hours. Hgb A1c No results for input(s): HGBA1C in the last 72 hours. Lipid Profile No results for input(s): CHOL, HDL, LDLCALC, TRIG, CHOLHDL, LDLDIRECT in the last 72 hours. Thyroid function studies No results for input(s): TSH, T4TOTAL, T3FREE, THYROIDAB in the last 72 hours.  Invalid input(s): FREET3 Anemia work up No results for input(s): VITAMINB12, FOLATE, FERRITIN, TIBC, IRON, RETICCTPCT in the last 72 hours. Urinalysis    Component Value Date/Time   COLORURINE YELLOW 04/25/2021 0740   APPEARANCEUR CLEAR 04/25/2021 0740   LABSPEC 1.021 04/25/2021 0740   PHURINE 7.0 04/25/2021 0740   GLUCOSEU NEGATIVE 04/25/2021 0740   HGBUR NEGATIVE 04/25/2021 0740   BILIRUBINUR NEGATIVE 04/25/2021 0740   KETONESUR NEGATIVE 04/25/2021 0740   PROTEINUR TRACE (A) 04/25/2021 0740   NITRITE NEGATIVE 04/25/2021 0740   LEUKOCYTESUR NEGATIVE 04/25/2021 0740   Sepsis Labs Invalid input(s): PROCALCITONIN,  WBC,  LACTICIDVEN Microbiology Recent Results (from the past 240 hour(s))  Resp Panel by RT-PCR (Flu A&B, Covid) Nasopharyngeal Swab     Status: None   Collection Time: 04/25/21  7:35 AM   Specimen: Nasopharyngeal Swab; Nasopharyngeal(NP) swabs in vial transport medium  Result Value Ref Range Status   SARS Coronavirus 2 by RT PCR NEGATIVE NEGATIVE Final    Comment: (NOTE) SARS-CoV-2 target nucleic acids are NOT DETECTED.  The SARS-CoV-2 RNA is generally detectable in upper respiratory specimens during the acute phase of infection. The lowest concentration of SARS-CoV-2 viral copies this assay can detect is 138 copies/mL. A negative result does not preclude SARS-Cov-2 infection and should not be used as the sole basis for treatment or other patient management decisions. A negative result may occur with  improper specimen collection/handling, submission of specimen other than nasopharyngeal swab,  presence of viral mutation(s) within the areas targeted by this assay, and inadequate number of viral copies(<138 copies/mL). A negative result must be combined with clinical observations, patient history, and epidemiological information. The expected result is Negative.  Fact Sheet for Patients:  BloggerCourse.com  Fact Sheet for Healthcare Providers:  SeriousBroker.it  This test is no t yet approved or cleared by the [place] FDA and  has been authorized for detection and/or diagnosis of SARS-CoV-2 by FDA under an Emergency Use Authorization (EUA). This EUA will remain  in effect (meaning this test can be used) for the duration of the COVID-19 declaration under Section 564(b)(1) of the Act, 21 U.S.C.section 360bbb-3(b)(1), unless the authorization is terminated  or revoked sooner.       Influenza A by PCR NEGATIVE NEGATIVE Final   Influenza B by PCR NEGATIVE NEGATIVE Final  Comment: (NOTE) The Xpert Xpress SARS-CoV-2/FLU/RSV plus assay is intended as an aid in the diagnosis of influenza from Nasopharyngeal swab specimens and should not be used as a sole basis for treatment. Nasal washings and aspirates are unacceptable for Xpert Xpress SARS-CoV-2/FLU/RSV testing.  Fact Sheet for Patients: BloggerCourse.com  Fact Sheet for Healthcare Providers: SeriousBroker.it  This test is not yet approved or cleared by the [place] FDA and has been authorized for detection and/or diagnosis of SARS-CoV-2 by FDA under an Emergency Use Authorization (EUA). This EUA will remain in effect (meaning this test can be used) for the duration of the COVID-19 declaration under Section 564(b)(1) of the Act, 21 U.S.C. section 360bbb-3(b)(1), unless the authorization is terminated or revoked.  Performed at [facility], [address] [place], [place] [address]   MRSA  Next Gen by PCR, Nasal     Status: None   Collection Time: 04/25/21  1:32 PM   Specimen: Nasal Mucosa; Nasal Swab  Result Value Ref Range Status   MRSA by PCR Next Gen NOT DETECTED NOT DETECTED Final    Comment: (NOTE) The GeneXpert MRSA Assay (FDA approved for NASAL specimens only), is one component of a comprehensive MRSA colonization surveillance program. It is not intended to diagnose MRSA infection nor to guide or monitor treatment for MRSA infections. Test performance is not FDA approved in patients less than 58 years old. Performed at [facility], [address], [place], [place] [address]      Time coordinating discharge: 32 minutes  SIGNED:   Arsenio Loader, MD  [facility] 04/28/2021, 1:00 PM

## 2021-04-28 NOTE — Progress Notes (Signed)
[facility] Gastroenterology Progress Note  [name] 35 y.o. 30-Dec-1985   Subjective: Feels good. No BMs overnight. Denies abdominal pain. Tolerating diet.  Objective: Vital signs: Vitals:   04/28/21 0414 04/28/21 0810  BP: (!) 145/81 (!) 159/75  Pulse: 95 100  Resp: 19 (!) 21  Temp: 98.9 F (37.2 C) 98.4 F (36.9 C)  SpO2: 99% 100%    Physical Exam: Gen: alert, no acute distress  HEENT: anicteric sclera CV: RRR Chest: CTA B Abd: soft, nontender, nondistended, +BS Ext: no edema  Lab Results: Recent Labs    04/27/21 0601 04/28/21 0053  NA 137 134*  K 3.5 3.3*  CL 105 102  CO2 26 26  GLUCOSE 98 96  BUN 10 7  CREATININE 0.85 0.84  CALCIUM 8.0* 7.7*   Recent Labs    04/26/21 0049 04/27/21 0601  AST 78* 67*  ALT 28 24  ALKPHOS 63 56  BILITOT 5.2* 6.8*  PROT 7.1 6.9  ALBUMIN 2.5* 2.1*   Recent Labs    04/26/21 0049 04/26/21 0811 04/27/21 1440 04/28/21 0053  WBC 5.2  --   --  6.5  NEUTROABS 3.6  --   --   --   HGB 6.1*   < > 7.3* 7.1*  HCT 19.9*   < > 22.3* 22.1*  MCV 80.2  --   --  79.8*  PLT 118*  --   --  122*   < > = values in this interval not displayed.      Assessment/Plan: S/P variceal bleed question gastric vs esophageal source. Medical management recommended by IR. Hemodynamically stable. No signs of further bleeding. Strongly advised to remain off of alcohol. Low sodium diet. Ok to go home from GI standpoint. F/U with Dr. Dulce Sellar in 4-6 weeks.   Shirley Friar 04/28/2021, 11:01 AM  Questions please call [phone] Patient ID: [name], male   DOB: Jul 22, 1986, 35 y.o.   MRN: [id number]

## 2021-04-28 NOTE — Progress Notes (Signed)
Patient given discharge instructions. PIVs removed. Telemetry box removed, CCMD notified. Box [id number] was placed in cubby at nurses station. Patient and belongings taken to vehicle via wheelchair by staff.  Kenard Gower, RN

## 2021-05-21 ENCOUNTER — Ambulatory Visit (INDEPENDENT_AMBULATORY_CARE_PROVIDER_SITE_OTHER): Payer: Self-pay | Admitting: Emergency Medicine

## 2021-05-21 ENCOUNTER — Encounter: Payer: Self-pay | Admitting: Emergency Medicine

## 2021-05-21 ENCOUNTER — Other Ambulatory Visit: Payer: Self-pay

## 2021-05-21 VITALS — BP 158/80 | HR 92 | Temp 98.5°F | Ht 69.0 in | Wt 217.0 lb

## 2021-05-21 DIAGNOSIS — Z09 Encounter for follow-up examination after completed treatment for conditions other than malignant neoplasm: Secondary | ICD-10-CM

## 2021-05-21 DIAGNOSIS — I10 Essential (primary) hypertension: Secondary | ICD-10-CM

## 2021-05-21 DIAGNOSIS — K7030 Alcoholic cirrhosis of liver without ascites: Secondary | ICD-10-CM | POA: Insufficient documentation

## 2021-05-21 NOTE — Assessment & Plan Note (Signed)
Stable without complications.  No upper GI bleed or melena.  No abdominal pain or significant ascites.  Has peripheral lower extremity edema.  No asterixis.  No signs of hepatic encephalopathy.  Diet and nutrition discussed.

## 2021-05-21 NOTE — Assessment & Plan Note (Signed)
Normotensive at present time.  On no medication.  Dietary approach to stop hypertension discussed.

## 2021-05-21 NOTE — Progress Notes (Signed)
[name] 35 y.o.   Chief Complaint  Patient presents with   Hospitalization Follow-up    Pt states he feels better, swelling in both legs returned. Pt states he has had several nose bleeds last week    HISTORY OF PRESENT ILLNESS: This is a 35 y.o. male here for hospital follow-up.  Admitted on 04/25/2021 with upper GI bleed. Upper endoscopy showed esophageal and gastric varices. Abdominal ultrasound showed liver cirrhosis. Patient has a history of chronic EtOH abuse. Hospital discharge summary as follows: Physician Discharge Summary  Brittin Janik ZOX:[id number] DOB: 1986-08-27 DOA: 04/25/2021   PCP: Patient, No Pcp Per (Inactive)   Admit date: 04/25/2021 Discharge date: 04/28/2021     Recommendations for Outpatient Follow-up:  Follow up with PCP in 7 to 10 days     Discharge Condition: Stable Diet recommendation: Regular   Brief/Interim Summary: This is is a 35 y.o. male patient with a history of hypertension who was admitted on 04/25/2021 after he had hematemesis and a syncopal episode.   Acute blood loss anemia secondary to upper GI bleed Status post EGD showed grade 1 esophageal varices, gastric varices and portal hypertensive gastropathy without stigmata of recent bleeding.   Did  require two units of packed RBC and 2 units FFP transfusion prior to transfer from [facility]..  Was managed with octreotide and Protonix during this hospitalization.  H&H remained stable.  Continue with oral Protonix.   Acute blood loss anemia Related to GI bleeding.  H&H is stable.  Keep him on iron supplements.   History of hypertension. Stable.   History of alcohol abuse Stopped drinking in April 2021.  No signs of alcohol withdrawal.   Liver cirrhosis Noted on abdominal ultrasound.  Likely alcohol induced.   Discharge Diagnoses:  Principal Problem:   GI bleeding Active Problems:   Essential hypertension   Acute blood loss anemia    HPI   Prior to Admission  medications   Medication Sig Start Date End Date Taking? Authorizing Provider  ferrous sulfate 325 (65 FE) MG tablet Take 1 tablet (325 mg total) by mouth daily. 04/28/21 04/28/22 Yes Arsenio Loader, MD  pantoprazole (PROTONIX) 40 MG tablet Take 1 tablet (40 mg total) by mouth daily. 04/28/21 04/28/22 Yes Arsenio Loader, MD    No Known Allergies  Patient Active Problem List   Diagnosis Date Noted   GI bleeding 04/25/2021   Essential hypertension 04/25/2021   Acute blood loss anemia 04/25/2021    No past medical history on file.  Past Surgical History:  Procedure Laterality Date   ESOPHAGOGASTRODUODENOSCOPY N/A 04/26/2021   Procedure: ESOPHAGOGASTRODUODENOSCOPY (EGD);  Surgeon: Willis Modena, MD;  Location: [facility];  Service: Endoscopy;  Laterality: N/A;   MANDIBLE FRACTURE SURGERY      Social History   Socioeconomic History   Marital status: Single    Spouse name: Not on file   Number of children: Not on file   Years of education: Not on file   Highest education level: Not on file  Occupational History   Not on file  Tobacco Use   Smoking status: Never   Smokeless tobacco: Never  Vaping Use   Vaping Use: Every day  Substance and Sexual Activity   Alcohol use: Not Currently    Comment: 3-4 times a week.    Drug use: No   Sexual activity: Not on file  Other Topics Concern   Not on file  Social History Narrative   Not on file  Social Determinants of Health   Financial Resource Strain: Not on file  Food Insecurity: Not on file  Transportation Needs: Not on file  Physical Activity: Not on file  Stress: Not on file  Social Connections: Not on file  Intimate Partner Violence: Not on file    No family history on file.   Review of Systems  Constitutional: Negative.  Negative for chills and fever.  HENT: Negative.  Negative for congestion and sore throat.   Respiratory: Negative.  Negative for cough and shortness of breath.    Cardiovascular: Negative.  Negative for chest pain and palpitations.  Gastrointestinal:  Negative for abdominal pain, diarrhea, nausea and vomiting.  Genitourinary: Negative.  Negative for dysuria and hematuria.  Musculoskeletal: Negative.   Skin: Negative.  Negative for rash.  Neurological: Negative.  Negative for dizziness and headaches.  All other systems reviewed and are negative.   Physical Exam Vitals reviewed.  Constitutional:      Appearance: Normal appearance.  HENT:     Head: Normocephalic.  Eyes:     Extraocular Movements: Extraocular movements intact.     Conjunctiva/sclera: Conjunctivae normal.     Pupils: Pupils are equal, round, and reactive to light.  Cardiovascular:     Rate and Rhythm: Normal rate and regular rhythm.     Pulses: Normal pulses.     Heart sounds: Normal heart sounds.  Pulmonary:     Effort: Pulmonary effort is normal.     Breath sounds: Normal breath sounds.  Abdominal:     General: Bowel sounds are normal. There is no distension.     Palpations: Abdomen is soft.     Tenderness: There is no abdominal tenderness.  Musculoskeletal:     Cervical back: Normal range of motion and neck supple.     Right lower leg: Edema present.     Left lower leg: Edema present.  Skin:    General: Skin is warm and dry.     Capillary Refill: Capillary refill takes less than 2 seconds.  Neurological:     General: No focal deficit present.     Mental Status: He is alert and oriented to person, place, and time.  Psychiatric:        Mood and Affect: Mood normal.        Behavior: Behavior normal.     ASSESSMENT & PLAN: Alcoholic cirrhosis of liver without ascites (HCC) Stable without complications.  No upper GI bleed or melena.  No abdominal pain or significant ascites.  Has peripheral lower extremity edema.  No asterixis.  No signs of hepatic encephalopathy.  Diet and nutrition discussed.  Essential hypertension Normotensive at present time.  On no  medication.  Dietary approach to stop hypertension discussed.  Landry Mellowerrell was seen today for hospitalization follow-up.  Diagnoses and all orders for this visit:  Alcoholic cirrhosis of liver without ascites (HCC) -     Ambulatory referral to Gastroenterology  Hospital discharge follow-up  Essential hypertension  Patient Instructions  Cirrhosis  Cirrhosis is long-term (chronic) liver injury. The liver is the body's largest internal organ, and it performs many functions. It converts food into energy, removes toxic material from theblood, makes important proteins, and absorbs necessary vitamins from food. In cirrhosis, healthy liver cells are replaced by scar tissue. This prevents blood from flowing through the liver and makes it difficult for the liver tocomplete its functions. What are the causes? Common causes of this condition are hepatitis C and long-term alcohol abuse. Other causes include: Nonalcoholic  fatty liver disease (NAFLD). This happens when fat is deposited in the liver by causes other than alcohol. Hepatitis B infection. Autoimmune hepatitis. In this condition, the body's defense system (immune system) mistakenly attacks the liver cells, causing inflammation. Diseases that cause blockage of ducts inside the liver. Inherited liver diseases, such as hemochromatosis. This is one of the most common inherited liver diseases. In this disease, deposits of iron collect in the liver and other organs. Reactions to certain long-term medicines, such as amiodarone, a heart medicine. Parasitic infections. These include schistosomiasis, which is caused by a flatworm. Long-term contact to certain toxins. These toxins include certain organic solvents, such as toluene and chloroform. What increases the risk? You are more likely to develop this condition if: You have certain types of viral hepatitis. You abuse alcohol, especially if you are male. You are overweight. You use IV drugs and  share needles. You have unprotected sex with someone who has viral hepatitis. What are the signs or symptoms? You may not have any signs and symptoms at first. Symptoms may not developuntil the damage to your liver starts to get worse. Early symptoms may include: Weakness and tiredness (fatigue). Changes in sleep patterns or having trouble sleeping. Itchiness. Tenderness in the right-upper part of your abdomen. Weight loss and muscle loss. Nausea. Loss of appetite. Later symptoms may include: Fatigue or weakness that is getting worse. Yellow skin and eyes (jaundice). Buildup of fluid in the abdomen (ascites). You may notice that your clothes are tight around your waist. Weight gain and swelling of the feet and ankles (edema). Trouble breathing. Easy bruising and bleeding. Vomiting blood, or black or bloody stool. Mental confusion. How is this diagnosed? Your health care provider may suspect cirrhosis based on your symptoms and medical history, especially if you have other medical conditions or a history of alcohol abuse. Your health care provider will do a physical exam to feel your liver and to check for signs of cirrhosis. Tests may include: Blood tests to check: For hepatitis B or C. Kidney function. Liver function. Imaging tests such as: MRI or CT scan to look for changes seen in advanced cirrhosis. Ultrasound to see if normal liver tissue is being replaced by scar tissue. A procedure in which a long needle is used to take a sample of liver tissue to be checked in a lab (biopsy). Liver biopsy can confirm the diagnosis of cirrhosis. How is this treated? Treatment for this condition depends on how damaged your liver is and what caused the damage. It may include treating the symptoms of cirrhosis, or treating the underlying causes to slow the damage. Treatment may include: Making lifestyle changes, such as: Eating a healthy diet. You may need to work with your health care provider  or a dietitian to develop an eating plan. Restricting salt intake. Maintaining a healthy weight. Not abusing drugs or alcohol. Taking medicines to: Treat liver infections or other infections. Control itching. Reduce fluid buildup. Reduce certain blood toxins. Reduce risk of bleeding from enlarged blood vessels in the stomach or esophagus (varices). Liver transplant. In this procedure, a liver from a donor is used to replace your diseased liver. This is done if cirrhosis has caused liver failure. Other treatments and procedures may be done depending on the problems that you get from cirrhosis. Common problems include liver-related kidney failure (hepatorenal syndrome). Follow these instructions at home:  Take medicines only as told by your health care provider. Do not use medicines that are toxic to your  liver. Ask your health care provider before taking any new medicines, including over-the-counter medicines such as NSAIDs. Rest as needed. Eat a well-balanced diet. Limit your salt or water intake, if your health care provider asks you to do this. Do not drink alcohol. This is especially important if you routinely take acetaminophen. Keep all follow-up visits. This is important. Contact a health care provider if you: Have fatigue or weakness that is getting worse. Develop swelling of the hands, feet, or legs, or a buildup of fluid in the abdomen (ascites). Have a fever or chills. Develop loss of appetite. Have nausea or vomiting. Develop jaundice. Develop easy bruising or bleeding. Get help right away if you: Vomit bright red blood or a material that looks like coffee grounds. Have blood in your stools. Notice that your stools appear black and tarry. Become confused. Have chest pain or trouble breathing. These symptoms may represent a serious problem that is an emergency. Do not wait to see if the symptoms will go away. Get medical help right away. Call your local emergency services  (911 in the [place] Do not drive yourself to the hospital. Summary Cirrhosis is chronic liver injury. Common causes are hepatitis C and long-term alcohol abuse. Tests used to diagnose cirrhosis include blood tests, imaging tests, and liver biopsy. Treatment for this condition involves treating the underlying cause. Avoid alcohol, drugs, salt, and medicines that may damage your liver. Get help right away if you vomit bright red blood or a material that looks like coffee grounds. This information is not intended to replace advice given to you by your health care provider. Make sure you discuss any questions you have with your healthcare provider. Document Revised: 06/29/2020 Document Reviewed: 06/29/2020 Elsevier Patient Education  2022 [employer]    Edwina Barth, MD [facility]

## 2021-05-21 NOTE — Patient Instructions (Signed)
Cirrhosis Cirrhosis is long-term (chronic) liver injury. The liver is the body's largest internal organ, and it performs many functions. It converts food into energy, removes toxic material from the blood, makes important proteins, and absorbs necessary vitamins from food. In cirrhosis, healthy liver cells are replaced by scar tissue. This prevents blood from flowing through the liver and makes it difficult for the liver to complete its functions. What are the causes? Common causes of this condition are hepatitis C and long-term alcohol abuse. Other causes include: Nonalcoholic fatty liver disease (NAFLD). This happens when fat is deposited in the liver by causes other than alcohol. Hepatitis B infection. Autoimmune hepatitis. In this condition, the body's defense system (immune system) mistakenly attacks the liver cells, causing inflammation. Diseases that cause blockage of ducts inside the liver. Inherited liver diseases, such as hemochromatosis. This is one of the most common inherited liver diseases. In this disease, deposits of iron collect in the liver and other organs. Reactions to certain long-term medicines, such as amiodarone, a heart medicine. Parasitic infections. These include schistosomiasis, which is caused by a flatworm. Long-term contact to certain toxins. These toxins include certain organic solvents, such as toluene and chloroform. What increases the risk? You are more likely to develop this condition if: You have certain types of viral hepatitis. You abuse alcohol, especially if you are male. You are overweight. You use IV drugs and share needles. You have unprotected sex with someone who has viral hepatitis. What are the signs or symptoms? You may not have any signs and symptoms at first. Symptoms may not develop until the damage to your liver starts to get worse. Early symptoms may include: Weakness and tiredness (fatigue). Changes in sleep patterns or having trouble  sleeping. Itchiness. Tenderness in the right-upper part of your abdomen. Weight loss and muscle loss. Nausea. Loss of appetite. Later symptoms may include: Fatigue or weakness that is getting worse. Yellow skin and eyes (jaundice). Buildup of fluid in the abdomen (ascites). You may notice that your clothes are tight around your waist. Weight gain and swelling of the feet and ankles (edema). Trouble breathing. Easy bruising and bleeding. Vomiting blood, or black or bloody stool. Mental confusion. How is this diagnosed? Your health care provider may suspect cirrhosis based on your symptoms and medical history, especially if you have other medical conditions or a history of alcohol abuse. Your health care provider will do a physical exam to feel your liver and to check for signs of cirrhosis. Tests may include: Blood tests to check: For hepatitis B or C. Kidney function. Liver function. Imaging tests such as: MRI or CT scan to look for changes seen in advanced cirrhosis. Ultrasound to see if normal liver tissue is being replaced by scar tissue. A procedure in which a long needle is used to take a sample of liver tissue to be checked in a lab (biopsy). Liver biopsy can confirm the diagnosis of cirrhosis. How is this treated? Treatment for this condition depends on how damaged your liver is and what caused the damage. It may include treating the symptoms of cirrhosis, or treating the underlying causes to slow the damage. Treatment may include: Making lifestyle changes, such as: Eating a healthy diet. You may need to work with your health care provider or a dietitian to develop an eating plan. Restricting salt intake. Maintaining a healthy weight. Not abusing drugs or alcohol. Taking medicines to: Treat liver infections or other infections. Control itching. Reduce fluid buildup. Reduce certain blood toxins.   Reduce risk of bleeding from enlarged blood vessels in the stomach or esophagus  (varices). Liver transplant. In this procedure, a liver from a donor is used to replace your diseased liver. This is done if cirrhosis has caused liver failure. Other treatments and procedures may be done depending on the problems that you get from cirrhosis. Common problems include liver-related kidney failure (hepatorenal syndrome). Follow these instructions at home:  Take medicines only as told by your health care provider. Do not use medicines that are toxic to your liver. Ask your health care provider before taking any new medicines, including over-the-counter medicines such as NSAIDs. Rest as needed. Eat a well-balanced diet. Limit your salt or water intake, if your health care provider asks you to do this. Do not drink alcohol. This is especially important if you routinely take acetaminophen. Keep all follow-up visits. This is important. Contact a health care provider if you: Have fatigue or weakness that is getting worse. Develop swelling of the hands, feet, or legs, or a buildup of fluid in the abdomen (ascites). Have a fever or chills. Develop loss of appetite. Have nausea or vomiting. Develop jaundice. Develop easy bruising or bleeding. Get help right away if you: Vomit bright red blood or a material that looks like coffee grounds. Have blood in your stools. Notice that your stools appear black and tarry. Become confused. Have chest pain or trouble breathing. These symptoms may represent a serious problem that is an emergency. Do not wait to see if the symptoms will go away. Get medical help right away. Call your local emergency services (911 in the [place] Do not drive yourself to the hospital. Summary Cirrhosis is chronic liver injury. Common causes are hepatitis C and long-term alcohol abuse. Tests used to diagnose cirrhosis include blood tests, imaging tests, and liver biopsy. Treatment for this condition involves treating the underlying cause. Avoid alcohol, drugs, salt,  and medicines that may damage your liver. Get help right away if you vomit bright red blood or a material that looks like coffee grounds. This information is not intended to replace advice given to you by your health care provider. Make sure you discuss any questions you have with your health care provider. Document Revised: 06/29/2020 Document Reviewed: 06/29/2020 Elsevier Patient Education  2022 [employer]  

## 2022-01-11 DIAGNOSIS — R0681 Apnea, not elsewhere classified: Secondary | ICD-10-CM | POA: Insufficient documentation

## 2022-01-11 DIAGNOSIS — R011 Cardiac murmur, unspecified: Secondary | ICD-10-CM | POA: Insufficient documentation

## 2022-01-12 ENCOUNTER — Inpatient Hospital Stay (HOSPITAL_COMMUNITY)
Admission: EM | Admit: 2022-01-12 | Discharge: 2022-01-16 | DRG: 809 | Disposition: A | Discharge status: Home / Self Care | Payer: Medicaid Other | Attending: Infectious Diseases | Admitting: Infectious Diseases

## 2022-01-12 ENCOUNTER — Emergency Department (HOSPITAL_COMMUNITY): Payer: Medicaid Other

## 2022-01-12 ENCOUNTER — Inpatient Hospital Stay (HOSPITAL_COMMUNITY): Payer: Medicaid Other

## 2022-01-12 ENCOUNTER — Encounter (HOSPITAL_COMMUNITY): Payer: Self-pay | Admitting: Emergency Medicine

## 2022-01-12 ENCOUNTER — Other Ambulatory Visit: Payer: Self-pay

## 2022-01-12 DIAGNOSIS — D696 Thrombocytopenia, unspecified: Secondary | ICD-10-CM | POA: Diagnosis present

## 2022-01-12 DIAGNOSIS — I350 Nonrheumatic aortic (valve) stenosis: Secondary | ICD-10-CM | POA: Diagnosis present

## 2022-01-12 DIAGNOSIS — D594 Other nonautoimmune hemolytic anemias: Principal | ICD-10-CM | POA: Diagnosis present

## 2022-01-12 DIAGNOSIS — Z8249 Family history of ischemic heart disease and other diseases of the circulatory system: Secondary | ICD-10-CM

## 2022-01-12 DIAGNOSIS — D509 Iron deficiency anemia, unspecified: Secondary | ICD-10-CM | POA: Diagnosis present

## 2022-01-12 DIAGNOSIS — I071 Rheumatic tricuspid insufficiency: Secondary | ICD-10-CM | POA: Diagnosis present

## 2022-01-12 DIAGNOSIS — K7460 Unspecified cirrhosis of liver: Secondary | ICD-10-CM | POA: Diagnosis present

## 2022-01-12 DIAGNOSIS — I10 Essential (primary) hypertension: Secondary | ICD-10-CM | POA: Diagnosis present

## 2022-01-12 DIAGNOSIS — D649 Anemia, unspecified: Secondary | ICD-10-CM | POA: Diagnosis present

## 2022-01-12 DIAGNOSIS — R011 Cardiac murmur, unspecified: Secondary | ICD-10-CM

## 2022-01-12 DIAGNOSIS — D689 Coagulation defect, unspecified: Secondary | ICD-10-CM | POA: Diagnosis present

## 2022-01-12 DIAGNOSIS — K068 Other specified disorders of gingiva and edentulous alveolar ridge: Secondary | ICD-10-CM | POA: Diagnosis present

## 2022-01-12 DIAGNOSIS — R002 Palpitations: Secondary | ICD-10-CM | POA: Diagnosis present

## 2022-01-12 LAB — COMPREHENSIVE METABOLIC PANEL
ALT: 25 U/L (ref 0–44)
AST: 84 U/L — ABNORMAL HIGH (ref 15–41)
Albumin: 2.6 g/dL — ABNORMAL LOW (ref 3.5–5.0)
Alkaline Phosphatase: 86 U/L (ref 38–126)
Anion gap: 6 (ref 5–15)
BUN: <5 mg/dL — ABNORMAL LOW (ref 6–20)
CO2: 25 mmol/L (ref 22–32)
Calcium: 8 mg/dL — ABNORMAL LOW (ref 8.9–10.3)
Chloride: 107 mmol/L (ref 98–111)
Creatinine, Ser: 0.71 mg/dL (ref 0.61–1.24)
GFR, Estimated: >60 mL/min (ref >60)
Glucose, Bld: 110 mg/dL — ABNORMAL HIGH (ref 70–99)
Potassium: 3.7 mmol/L (ref 3.5–5.1)
Sodium: 138 mmol/L (ref 135–145)
Total Bilirubin: 3.9 mg/dL — ABNORMAL HIGH (ref 0.3–1.2)
Total Protein: 9.3 g/dL — ABNORMAL HIGH (ref 6.5–8.1)

## 2022-01-12 LAB — DIC (DISSEMINATED INTRAVASCULAR COAGULATION)PANEL
D-Dimer, Quant: 0.81 ug/mL-FEU — ABNORMAL HIGH (ref 0.00–0.50)
Fibrinogen: 194 mg/dL — ABNORMAL LOW (ref 210–475)
INR: 2 — ABNORMAL HIGH (ref 0.8–1.2)
Platelets: 129 10*3/uL — ABNORMAL LOW (ref 150–400)
Prothrombin Time: 22.3 seconds — ABNORMAL HIGH (ref 11.4–15.2)
aPTT: 48 seconds — ABNORMAL HIGH (ref 24–36)

## 2022-01-12 LAB — URINALYSIS, ROUTINE W REFLEX MICROSCOPIC
Bacteria, UA: NONE SEEN
Bilirubin Urine: NEGATIVE
Glucose, UA: NEGATIVE mg/dL
Ketones, ur: NEGATIVE mg/dL
Leukocytes,Ua: NEGATIVE
Nitrite: NEGATIVE
Protein, ur: NEGATIVE mg/dL
Specific Gravity, Urine: 1.016 (ref 1.005–1.030)
pH: 6 (ref 5.0–8.0)

## 2022-01-12 LAB — SAVE SMEAR(SSMR), FOR PROVIDER SLIDE REVIEW

## 2022-01-12 LAB — CBC WITH DIFFERENTIAL/PLATELET
Abs Immature Granulocytes: 0 10*3/uL (ref 0.00–0.07)
Basophils Absolute: 0.2 10*3/uL — ABNORMAL HIGH (ref 0.0–0.1)
Basophils Relative: 4 %
Eosinophils Absolute: 0.3 10*3/uL (ref 0.0–0.5)
Eosinophils Relative: 5 %
HCT: 14 % — ABNORMAL LOW (ref 39.0–52.0)
Hemoglobin: 3.8 g/dL — CL (ref 13.0–17.0)
Lymphocytes Relative: 17 %
Lymphs Abs: 1 10*3/uL (ref 0.7–4.0)
MCH: 17.8 pg — ABNORMAL LOW (ref 26.0–34.0)
MCHC: 27.1 g/dL — ABNORMAL LOW (ref 30.0–36.0)
MCV: 65.7 fL — ABNORMAL LOW (ref 80.0–100.0)
Monocytes Absolute: 0.3 10*3/uL (ref 0.1–1.0)
Monocytes Relative: 5 %
Neutro Abs: 4 10*3/uL (ref 1.7–7.7)
Neutrophils Relative %: 69 %
Platelets: 136 10*3/uL — ABNORMAL LOW (ref 150–400)
RBC: 2.13 MIL/uL — ABNORMAL LOW (ref 4.22–5.81)
RDW: 26.2 % — ABNORMAL HIGH (ref 11.5–15.5)
WBC: 5.8 10*3/uL (ref 4.0–10.5)
nRBC: 0.5 % — ABNORMAL HIGH (ref 0.0–0.2)
nRBC: 1 /100 WBC — ABNORMAL HIGH

## 2022-01-12 LAB — BILIRUBIN, FRACTIONATED(TOT/DIR/INDIR)
Bilirubin, Direct: 1.8 mg/dL — ABNORMAL HIGH (ref 0.0–0.2)
Indirect Bilirubin: 2.8 mg/dL — ABNORMAL HIGH (ref 0.3–0.9)
Total Bilirubin: 4.6 mg/dL — ABNORMAL HIGH (ref 0.3–1.2)

## 2022-01-12 LAB — LACTATE DEHYDROGENASE: LDH: 379 U/L — ABNORMAL HIGH (ref 98–192)

## 2022-01-12 LAB — CBC
HCT: 15.7 % — ABNORMAL LOW (ref 39.0–52.0)
Hemoglobin: 4.6 g/dL — CL (ref 13.0–17.0)
MCH: 20.2 pg — ABNORMAL LOW (ref 26.0–34.0)
MCHC: 29.3 g/dL — ABNORMAL LOW (ref 30.0–36.0)
MCV: 68.9 fL — ABNORMAL LOW (ref 80.0–100.0)
Platelets: 121 10*3/uL — ABNORMAL LOW (ref 150–400)
RBC: 2.28 MIL/uL — ABNORMAL LOW (ref 4.22–5.81)
RDW: 26.6 % — ABNORMAL HIGH (ref 11.5–15.5)
WBC: 5 10*3/uL (ref 4.0–10.5)
nRBC: 0.8 % — ABNORMAL HIGH (ref 0.0–0.2)

## 2022-01-12 LAB — TECHNOLOGIST SMEAR REVIEW: Plt Morphology: ADEQUATE

## 2022-01-12 LAB — IRON AND TIBC
Iron: 44 ug/dL — ABNORMAL LOW (ref 45–182)
Saturation Ratios: 12 % — ABNORMAL LOW (ref 17.9–39.5)
TIBC: 367 ug/dL (ref 250–450)
UIBC: 323 ug/dL

## 2022-01-12 LAB — RETICULOCYTES

## 2022-01-12 LAB — ECHOCARDIOGRAM COMPLETE
AR max vel: 2.52 cm2
AV Area VTI: 2.67 cm2
AV Area mean vel: 2.78 cm2
AV Mean grad: 8.5 mmHg
AV Peak grad: 19 mmHg
Ao pk vel: 2.18 m/s
Area-P 1/2: 5.13 cm2
S' Lateral: 4.2 cm

## 2022-01-12 LAB — VITAMIN B12: Vitamin B-12: 1279 pg/mL — ABNORMAL HIGH (ref 180–914)

## 2022-01-12 LAB — DIRECT ANTIGLOBULIN TEST (NOT AT ARMC)
DAT, IgG: NEGATIVE
DAT, complement: NEGATIVE

## 2022-01-12 LAB — PREPARE RBC (CROSSMATCH)

## 2022-01-12 LAB — FERRITIN: Ferritin: 12 ng/mL — ABNORMAL LOW (ref 24–336)

## 2022-01-12 LAB — HEMOGLOBIN A1C
Hgb A1c MFr Bld: 4.9 % (ref 4.8–5.6)
Mean Plasma Glucose: 93.93 mg/dL

## 2022-01-12 LAB — BRAIN NATRIURETIC PEPTIDE: B Natriuretic Peptide: 297.3 pg/mL — ABNORMAL HIGH (ref 0.0–100.0)

## 2022-01-12 LAB — POC OCCULT BLOOD, ED: Fecal Occult Bld: NEGATIVE

## 2022-01-12 LAB — FOLATE: Folate: 10 ng/mL (ref >5.9)

## 2022-01-12 MED ORDER — SODIUM CHLORIDE 0.9% IV SOLUTION: Freq: Once | INTRAVENOUS | Status: AC

## 2022-01-12 MED ORDER — SODIUM CHLORIDE 0.9 % IV SOLN
10.0000 mL/h | Freq: Once | INTRAVENOUS | Status: AC
  Administered 2022-01-12: 10 mL/h via INTRAVENOUS

## 2022-01-12 MED ORDER — PANTOPRAZOLE SODIUM 40 MG PO TBEC
40.0000 mg | DELAYED_RELEASE_TABLET | Freq: Every day | ORAL | Status: DC
  Administered 2022-01-12 – 2022-01-16 (×5): 40 mg via ORAL
  Filled 2022-01-12 (×5): qty 1

## 2022-01-12 NOTE — H&P (Addendum)
? ? ? ?Date: 01/12/2022     ?     ?     ?Patient Name:  [name] MRN: [id number]  ?DOB: Jul 03, 1986 Age / Sex: 36 y.o., male   ?PCP: Georgina Quint, MD    ?     ?Medical Service: Internal Medicine Teaching Service    ?     ?Attending Physician: Dr. Earl Lagos, MD    ?First Contact: Dr. Lamar Sprinkles, MD Pager: [phone]  ?Second Contact: Dr. Quincy Simmonds, MD Pager: [phone]  ?     ?After Hours (After 5p/  First Contact Pager: [phone]  ?weekends / holidays): Second Contact Pager: [phone]  ? ?Chief Complaint: PCP  ? ?History of Present Illness:  ? ?[name] is a 36 year old male with PMHx HTN and alcohol use disorder complicated by esophageal varices with portal hypertension and cirrhosis who presented from PCP with critical Hgb 3.5, palpitations, and dyspnea.  Patient has been waking up in the middle of the night due to palpitations for the past 3 months. Fiancee made and appointment about irregular breathing.  Noticed he has been sleeping more wakes up at 7am rather than 4 am. Feels well while awake. No headaches or dizziness. Shortness of breath related to palpiations. No chest pain or exertional symptoms. No hematuria or dysuria. No dark or bloody stools. When he urinates having a stronger smell for the past month. Denies N/V.  As well, he denies easy bruising but notes that for the past 3 to 4 months, he has had easy bleeding while brushing his teeth and bleeding of his lips with minimal aggravation.  Swelling in legs for the 2 past months; never went back down completely and with skin discoloration.  He reports the swelling in his ankles that will sometimes get better at night.  Yesterday, PCP noticed a murmur. No recent illness. No diarrhea. No recent URIs.  ? ?Patient has lost about 100 lbs over the past couple years, aiding in his blood pressure and blood glucose.  He is no longer taking antihypertensives. ? ? ?Meds:  ?Patient currently not taking any medications. Last took Fe  supplementation and pantoprazole 2 days ago.  ? ? ?Allergies: ?Allergies as of 01/12/2022  ? (No Known Allergies)  ? ?History reviewed. No pertinent past medical history. ? ?Family History: Mom- HTN ?MI- Dad and paternal uncle ?No history of bleeding disorders ? ?Social History: Lives with fiance and daughter. Has not worked for 2 months. Worked at loading trucks, driving forklifts, and cooking. Denies tobacco use, Used to drink in July and last drink was before then. Brother said eyes were yellow at that time.  No other supplements or drugs.  ?Does not drink much water drink orange juice and Gatorade.  ? ?Review of Systems: ?A complete ROS was negative except as per HPI.  ? ?Physical Exam: ?Blood pressure (!) 141/80, pulse 86, temperature 98 ?F (36.7 ?C), resp. rate (!) 22, SpO2 100 %. ?Physical Exam: ?General: well-appearing male lying in bed in NAD ?HENT: normocephalic, atraumatic, external nares and ears appear normal. Poor dentition with actively bleeding lips and gums ?EYES: bilateral scleral icterus present ?CV: regular rate, normal rhythm. Grade IV/VI murmur present, best heard at RUSB. Also auscultated at LUSB. ?Pulmonary: normal work of breathing on RA, lungs clear to auscultation bilaterally ?Abdominal: non-distended, soft, non-tender to palpation, normal BS all 4 quadrants ?Skin: Warm and dry.  No signs of petechiae or bruising ?Neurological: Awake, alert and oriented x3. No focal deficits  of CNII-XII. ?Psych: normal affect and behavior  ? ?EKG: personally reviewed my interpretation is normal sinus rhythm ? ?CXR: personally reviewed my interpretation is cardiomegaly but no acute disease processes. ? ?Assessment & Plan by Problem: ?[name] is a 36 year old male with PMHx lifestyle managed HTN and alcohol use disorder-in remission, complicated by esophageal varices with portal hypertension and cirrhosis who presented from PCP with critical Hgb 3.5, palpitations, and dyspnea. ? ?Principal Problem: ?   Symptomatic anemia ? ?#Symptomatic anemia ?#Thrombocytopenia ?#History of bleeding esophageal varices ?Patient visited PCP yesterday where hemoglobin was found to be 3.5, this a.m. hemoglobin 3.8.  Transfused 2 units PRBCs in the ED.  Thrombocytopenia ongoing since July 2022: 136 today, increased from 122 in July.  ?Ddx: portal hTN gastropathy vs Heyde's syndrome vs vitamin B12 or folate deficiency vs acquired coagulopathy, less likely bleeding esophageal varices this admission, as patient without hematemesis.  PT 22.3, INR 2.0, APTT 48, fibrinogen 194, D-dimer 0.81, LDH 379, target cells, teardrop cells, schistocytes present on morphology, iron 44, ferritin 12. ?- Consult placed to hematology for concerns of hemolysis ?- S/p 2 units PRBCs; f/u posttransfusion CBC ?- Follow-up Coombs test, vitamin B12, folate, haptoglobin ?- Transfuse for hemoglobin less than 7 ?- Initiate pantoprazole 40 mg daily for prophylaxis ? ?#Palpitations ?#New murmur ?Patient reports palpitations waking him up nightly for the past 3 months.  Denies feeling throughout the day.  EKG with normal sinus rhythm.  Patient also with new murmur, c/w aortic stenosis.  Could be reactive from severe anemia vs CHF vs acquired coagulopathy, need to rule out structural cardiac abnormalities. ?- Continuous heart monitoring ?- Follow-up echocardiogram ?- If structural abnormality present, will consider cardiology consult ? ?#History of hypertension ?Lifestyle controlled, no longer taking antihypertensives. ?- Continue to monitor ? ?Best Practices ?Diet: Regular ?DVT prophylaxis: SCDs ?Code:Full ?Dispo: Admit patient to Observation with expected length of stay less than 2 midnights. ? ?Signed: ?Rosezetta Schlatter, MD ?01/12/2022, 6:12 PM  ?Pager: [phone] ?After 5pm on weekdays and 1pm on weekends: On Call pager: [phone]  ?

## 2022-01-12 NOTE — ED Notes (Signed)
Labs drawn per Cosby MD, MD states ok to draw labs ordered besides CBC just after finishing blood transfusion.  ?

## 2022-01-12 NOTE — ED Provider Triage Note (Signed)
Emergency Medicine Provider Triage Evaluation Note ? ?[name] , a 36 y.o. male  was evaluated in triage.  Pt complains of abnormal labs.  Patient reports he saw his PCP who called him and told him to go to the hospital due to a decreased hemoglobin.  Says he originally went to PCP because his legs are swelling he has been experiencing palpitations.  No known medical conditions. ? ?Review of Systems  ?Positive: As above ?Negative: Chest pain or shortness of breath ? ?Physical Exam  ?BP (!) 147/59 (BP Location: Right Arm)   Pulse 96   Temp 98.3 ?F (36.8 ?C) (Oral)   Resp 16   SpO2 100%  ?Gen:   Awake, no distress   ?Resp:  Normal effort  ?MSK:   Moves extremities without difficulty  ?Other:  Bilateral lower extremities with edema to the level of the tibial tuberosity.  Eyes jaundiced.  RRR, decreased lung sounds bilaterally ? ?Medical Decision Making  ?Medically screening exam initiated at 9:45 AM.  Appropriate orders placed.  [name] was informed that the remainder of the evaluation will be completed by another provider, this initial triage assessment does not replace that evaluation, and the importance of remaining in the ED until their evaluation is complete. ? ? ?  ?Saddie Benders, PA-C ?01/12/22 2542 ? ?

## 2022-01-12 NOTE — ED Notes (Signed)
Electronic blood consent obtained, signed by pt and this RN.  

## 2022-01-12 NOTE — ED Provider Notes (Signed)
?[facility] EMERGENCY DEPARTMENT ?Provider Note ? ? ?CSN: [id number] ?Arrival date & time: 01/12/22  5400 ? ?  ? ?History ? ?Chief Complaint  ?Patient presents with  ? abnormal labs  ? ? ?[name] is a 36 y.o. male. ? ?HPI ?36 year old male history of esophageal variceal upper GI bleeding with portal hypertension presents today with reports of palpitations and anemia.  He states he is having some palpitations at night.  He saw his primary care doctor yesterday.  Labs were obtained and he was called today and told to come to the ED due to hemoglobin of 3.  He reports that his symptoms have mainly been for palpitations at night.  He also reports that he has had some increased peripheral edema.  He has not had any obvious GI bleeding or noted any particularly dark stools.  He reports that he stopped drinking alcohol after the last admission.  He denies headache, head injury, syncope, chest pain, dyspnea, abdominal pain, rectal bleeding.  He endorses that he has had decreased urine output during the day and increased urine output at night.  His primary care physician is Dr. Derrell Lolling at [facility].  Previous endoscopy performed by Dr. Dulce Sellar ?  ? ?Home Medications ?Prior to Admission medications   ?Medication Sig Start Date End Date Taking? Authorizing Provider  ?pantoprazole (PROTONIX) 40 MG tablet Take 1 tablet (40 mg total) by mouth daily. 04/28/21 04/28/22 Yes Arsenio Loader, MD  ?   ? ?Allergies    ?Patient has no known allergies.   ? ?Review of Systems   ?Review of Systems  ?All other systems reviewed and are negative. ? ?Physical Exam ?Updated Vital Signs ?BP 129/61   Pulse 84   Temp 98 ?F (36.7 ?C) (Oral)   Resp 19   SpO2 100%  ?Physical Exam ?Vitals and nursing note reviewed.  ?Constitutional:   ?   General: He is not in acute distress. ?   Appearance: Normal appearance. He is not ill-appearing.  ?HENT:  ?   Head: Normocephalic and atraumatic.  ?   Right Ear: External ear normal.   ?   Left Ear: External ear normal.  ?   Nose: Nose normal.  ?   Mouth/Throat:  ?   Mouth: Mucous membranes are dry.  ?Eyes:  ?   Extraocular Movements: Extraocular movements intact.  ?   Pupils: Pupils are equal, round, and reactive to light.  ?Cardiovascular:  ?   Rate and Rhythm: Normal rate and regular rhythm.  ?   Pulses: Normal pulses.  ?Pulmonary:  ?   Effort: Pulmonary effort is normal.  ?   Breath sounds: Normal breath sounds.  ?Abdominal:  ?   General: Bowel sounds are normal.  ?   Palpations: Abdomen is soft.  ?   Comments: Right upper lobe with palpable liver  ?Genitourinary: ?   Rectum: Normal.  ?Musculoskeletal:     ?   General: Swelling present. Normal range of motion.  ?   Cervical back: Normal range of motion.  ?   Right lower leg: Edema present.  ?   Left lower leg: Edema present.  ?Skin: ?   General: Skin is warm and dry.  ?   Capillary Refill: Capillary refill takes less than 2 seconds.  ?Neurological:  ?   General: No focal deficit present.  ?   Mental Status: He is alert.  ?Psychiatric:     ?   Mood and Affect: Mood normal.     ?  Behavior: Behavior normal.  ? ? ?ED Results / Procedures / Treatments   ?Labs ?(all labs ordered are listed, but only abnormal results are displayed) ?Labs Reviewed  ?CBC WITH DIFFERENTIAL/PLATELET - Abnormal; Notable for the following components:  ?    Result Value  ? RBC 2.13 (*)   ? Hemoglobin 3.8 (*)   ? HCT 14.0 (*)   ? MCV 65.7 (*)   ? MCH 17.8 (*)   ? MCHC 27.1 (*)   ? RDW 26.2 (*)   ? Platelets 136 (*)   ? nRBC 0.5 (*)   ? Basophils Absolute 0.2 (*)   ? nRBC 1 (*)   ? All other components within normal limits  ?URINALYSIS, ROUTINE W REFLEX MICROSCOPIC - Abnormal; Notable for the following components:  ? Color, Urine AMBER (*)   ? Hgb urine dipstick SMALL (*)   ? All other components within normal limits  ?COMPREHENSIVE METABOLIC PANEL - Abnormal; Notable for the following components:  ? Glucose, Bld 110 (*)   ? BUN <5 (*)   ? Calcium 8.0 (*)   ? Total Protein  9.3 (*)   ? Albumin 2.6 (*)   ? AST 84 (*)   ? Total Bilirubin 3.9 (*)   ? All other components within normal limits  ?BRAIN NATRIURETIC PEPTIDE  ?POC OCCULT BLOOD, ED  ?TYPE AND SCREEN  ?PREPARE RBC (CROSSMATCH)  ? ? ?EKG ?EKG Interpretation ? ?Date/Time:  Saturday January 12 2022 09:41:56 EDT ?Ventricular Rate:  90 ?PR Interval:  136 ?QRS Duration: 98 ?QT Interval:  386 ?QTC Calculation: 472 ?R Axis:   27 ?Text Interpretation: Normal sinus rhythm Possible Inferior infarct , age undetermined nsst v4-v6 when compared to first prior ekg of 25 April 2021 changes are new Confirmed by Margarita Grizzle [phone]) on 01/12/2022 12:21:20 PM ? ?Radiology ?DG Chest 2 View ? ?Result Date: 01/12/2022 ?CLINICAL DATA:  36 year old male with history of shortness of breath. EXAM: CHEST - 2 VIEW COMPARISON:  No priors. FINDINGS: Lung volumes are normal. No consolidative airspace disease. No pleural effusions. No pneumothorax. No evidence of pulmonary edema. Heart size is enlarged. Upper mediastinal contours are within normal limits. IMPRESSION: 1. No radiographic evidence of acute cardiopulmonary disease. 2. Cardiomegaly. Electronically Signed   By: Trudie Reed M.D.   On: 01/12/2022 10:37   ? ?Procedures ?Marland KitchenCritical Care ?Performed by: Margarita Grizzle, MD ?Authorized by: Margarita Grizzle, MD  ? ?Critical care provider statement:  ?  Critical care time (minutes):  30 ?  Critical care end time:  01/12/2022 12:49 PM ?  Critical care was time spent personally by me on the following activities:  Development of treatment plan with patient or surrogate, discussions with consultants, evaluation of patient's response to treatment, examination of patient, ordering and review of laboratory studies, ordering and review of radiographic studies, ordering and performing treatments and interventions, pulse oximetry, re-evaluation of patient's condition and review of old charts  ? ? ?Medications Ordered in ED ?Medications  ?0.9 %  sodium chloride infusion (has  no administration in time range)  ? ? ?ED Course/ Medical Decision Making/ A&P ?Clinical Course as of 01/12/22 1248  ?Sat Jan 12, 2022  ?1217 CBC with Differential(!!) ?CBC reviewed and interpreted with normal white blood cell count 5800 ?Hemoglobin decreased at 3.8 [DR]  ?1218 Prior hemoglobin was 7 7 months ago ?Platelets 136,000 [DR]  ?1218 Comprehensive metabolic panel(!) ?Complete metabolic panel reviewed interpreted with sodium potassium chloride and CO2 normal ?AST is elevated at 84 and  bilirubin elevated at 3.9 [DR]  ?1218 Chest x-Derick [place] significant for cardiomegaly no other acute disease noted and radiologist interpretation concurs [DR]  ?1219 Urinalysis, Routine w reflex microscopic(!) ?Urine obtained and is amber with 11-20 red blood cells ?No bacteria noted [DR]  ?  ?Clinical Course User Index ?[DR] Margarita Grizzleay, Samra Pesch, MD  ? ?                        ?Medical Decision Making ?36 year old male history of upper GI bleed presents today with palpitations and anemia.  Hemoglobin is 3.8.  Patient is hemodynamically stable here.  He denies any recent known GI bleeding.  He has not noted any dark tarry stools and has not had any hematemesis.  He reports that [facility] due to the palpitations he noted only at night. ?1 severe anemia hemoglobin 3.3 on review of records his last one 7 months ago was 7.  No overt GI bleeding, but would suspect that this is coming from GI tract given his history.  2 units packed red blood cells have been ordered ?2 cirrhosis patient has known history of variceal bleeding and drank alcohol until 2022.  He reports no current alcohol use.  His AST is elevated at 84 and bilirubin is 3.9.  Rectal was examined with no stool in rectal vault ?3 peripheral edema patient reports increased peripheral edema and increased urination at night with decreased urination during the day.  Swelling is equal albumin is low at 2 ?Plan consultation to unassigned medicine  for further admission, evaluation, treatment ? ?Amount and/or Complexity of Data Reviewed ?External Data Reviewed: labs and notes. ?   Details: Prior GI notes reviewed ?Labs: ordered. Decision-making detail

## 2022-01-12 NOTE — Progress Notes (Signed)
?  Echocardiogram ?2D Echocardiogram has been performed. ? ?[name] ?01/12/2022, 5:21 PM ?

## 2022-01-12 NOTE — ED Triage Notes (Addendum)
Pt states he was seen by PCP yesterday due to palpitations.  States he received a call this morning that his Hgb was low.  Denies SOB or fatigue.   ?

## 2022-01-12 NOTE — ED Notes (Signed)
Acuity increased due to abnormal labs ?

## 2022-01-13 ENCOUNTER — Inpatient Hospital Stay (HOSPITAL_COMMUNITY): Payer: Medicaid Other

## 2022-01-13 DIAGNOSIS — D649 Anemia, unspecified: Secondary | ICD-10-CM

## 2022-01-13 LAB — BASIC METABOLIC PANEL
Anion gap: 8 (ref 5–15)
BUN: <5 mg/dL — ABNORMAL LOW (ref 6–20)
CO2: 20 mmol/L — ABNORMAL LOW (ref 22–32)
Calcium: 8.1 mg/dL — ABNORMAL LOW (ref 8.9–10.3)
Chloride: 108 mmol/L (ref 98–111)
Creatinine, Ser: 0.61 mg/dL (ref 0.61–1.24)
GFR, Estimated: >60 mL/min (ref >60)
Glucose, Bld: 87 mg/dL (ref 70–99)
Potassium: 3.7 mmol/L (ref 3.5–5.1)
Sodium: 136 mmol/L (ref 135–145)

## 2022-01-13 LAB — CBC
HCT: 20.5 % — ABNORMAL LOW (ref 39.0–52.0)
Hemoglobin: 6.4 g/dL — CL (ref 13.0–17.0)
MCH: 22.1 pg — ABNORMAL LOW (ref 26.0–34.0)
MCHC: 31.2 g/dL (ref 30.0–36.0)
MCV: 70.9 fL — ABNORMAL LOW (ref 80.0–100.0)
Platelets: 113 10*3/uL — ABNORMAL LOW (ref 150–400)
RBC: 2.89 MIL/uL — ABNORMAL LOW (ref 4.22–5.81)
RDW: 27 % — ABNORMAL HIGH (ref 11.5–15.5)
WBC: 5.9 10*3/uL (ref 4.0–10.5)
nRBC: 1.7 % — ABNORMAL HIGH (ref 0.0–0.2)

## 2022-01-13 LAB — PREPARE RBC (CROSSMATCH)

## 2022-01-13 LAB — HAPTOGLOBIN: Haptoglobin: <10 mg/dL — ABNORMAL LOW (ref 17–317)

## 2022-01-13 LAB — HEPATIC FUNCTION PANEL
ALT: 20 U/L (ref 0–44)
AST: 75 U/L — ABNORMAL HIGH (ref 15–41)
Albumin: 2.4 g/dL — ABNORMAL LOW (ref 3.5–5.0)
Alkaline Phosphatase: 81 U/L (ref 38–126)
Bilirubin, Direct: 2.4 mg/dL — ABNORMAL HIGH (ref 0.0–0.2)
Indirect Bilirubin: 4.6 mg/dL — ABNORMAL HIGH (ref 0.3–0.9)
Total Bilirubin: 7 mg/dL — ABNORMAL HIGH (ref 0.3–1.2)
Total Protein: 8.9 g/dL — ABNORMAL HIGH (ref 6.5–8.1)

## 2022-01-13 MED ORDER — IOHEXOL 300 MG/ML  SOLN
100.0000 mL | Freq: Once | INTRAMUSCULAR | Status: AC | PRN
  Administered 2022-01-13: 100 mL via INTRAVENOUS

## 2022-01-13 MED ORDER — SIMETHICONE 80 MG PO CHEW
80.0000 mg | CHEWABLE_TABLET | Freq: Four times a day (QID) | ORAL | Status: DC | PRN
  Filled 2022-01-13: qty 1

## 2022-01-13 MED ORDER — SODIUM CHLORIDE 0.9% IV SOLUTION
Freq: Once | INTRAVENOUS | Status: AC
  Administered 2022-01-13: 10 mL via INTRAVENOUS

## 2022-01-13 MED ORDER — SODIUM CHLORIDE 0.9 % IV SOLN
250.0000 mg | Freq: Once | INTRAVENOUS | Status: AC
  Administered 2022-01-13: 250 mg via INTRAVENOUS
  Filled 2022-01-13: qty 20

## 2022-01-13 MED ORDER — IOHEXOL 9 MG/ML PO SOLN
ORAL | Status: AC
  Filled 2022-01-13: qty 1000

## 2022-01-13 MED ORDER — SIMETHICONE 80 MG PO CHEW
80.0000 mg | CHEWABLE_TABLET | Freq: Once | ORAL | Status: AC
  Administered 2022-01-13: 80 mg via ORAL
  Filled 2022-01-13: qty 1

## 2022-01-13 MED ORDER — SENNOSIDES-DOCUSATE SODIUM 8.6-50 MG PO TABS
2.0000 | ORAL_TABLET | Freq: Every day | ORAL | Status: DC
  Filled 2022-01-13 (×2): qty 2

## 2022-01-13 NOTE — Progress Notes (Signed)
? ?Subjective: Patient's posttransfusion hemoglobin 4.6, so given additional 2 units PRBCs. ? ? ? ?Objective: ? ?Vital signs in last 24 hours: ?Vitals:  ? 01/13/22 0248 01/13/22 0515 01/13/22 0928 01/13/22 1054  ?BP: (!) 143/82 (!) 142/80 (!) 149/73 (!) 145/77  ?Pulse: 87 80 92 96  ?Resp: 18 18 18 16   ?Temp: 98.8 ?F (37.1 ?C) 98 ?F (36.7 ?C) 98.3 ?F (36.8 ?C) 99.4 ?F (37.4 ?C)  ?TempSrc: Oral Oral    ?SpO2: 99% 98% 99% 100%  ?Weight:      ?Height:      ? ?General: well-appearing male lying in bed in NAD ?HENT: normocephalic, atraumatic, external nares and ears appear normal. Poor dentition without bleeding lips and gums today ?EYES: bilateral scleral icterus present ?CV: regular rate, normal rhythm.  No murmurs, no rubs, gallops auscultated today.  3+ pitting edema, but improvements to swelling bilaterally. ?Pulmonary: normal work of breathing on RA, lungs clear to auscultation bilaterally ?Abdominal: non-distended, soft, non-tender to palpation, normal BS all 4 quadrants ?Skin: Warm and dry.  No signs of petechiae or bruising ?Neurological: Awake, alert and oriented x3. No focal deficits of CNII-XII. ?Psych: normal affect and behavior  ? ?Assessment/Plan: ?[name] is a 36 year old male with PMHx lifestyle managed HTN and alcohol use disorder-in remission, complicated by esophageal varices with portal hypertension and cirrhosis who presented from PCP with critical Hgb 3.5, palpitations, and dyspnea.  Patient's symptoms improved s/p 4 units PRBCs.  Patient reports amber-colored urine last night, which appeared more "20 colored" today. ?  ?Principal Problem: ?  Symptomatic anemia ? ?#Symptomatic anemia ?#Thrombocytopenia ?#History of bleeding esophageal varices ?Patient visited PCP 4/14 where hemoglobin was found to be 3.5, 3.8 on admission.  Transfused 2 units PRBCs in the ED, with improvement to 4.6.  He was given an additional 2 units and increased to 6.4 this a.m. Thrombocytopenia ongoing since  July 2022: 136 today, increased from 122 in July. PT 22.3, INR 2.0, APTT 48, fibrinogen 194, D-dimer 0.81, LDH 379, Coombs negative target cells, teardrop cells, schistocytes present on morphology, iron 44, ferritin 12. B12 and folate without deficiency. ?Ddx: portal hTN gastropathy vs PNH vs Heyde's syndrome vs acquired coagulopathy, less likely bleeding esophageal varices this admission, as patient without hematemesis.  Hematology on board for concerns of hemolysis, appreciate recs. ?- We will transfuse additional 2 units PRBCs ?- Iron infusion with ferric gluconate 250 mg IV x 1 ?-Follow-up posttransfusion H&H ?-CT CAP to rule out malignancy ?- Follow-up haptoglobin ?- Transfuse for hemoglobin less than 7 ?- Continue pantoprazole 40 mg daily for prophylaxis ?  ?#Palpitations, improved ?#New murmur, resolved ?Patient reports palpitations waking him up nightly for the past 3 months.  Denies feeling throughout the day.  EKG with normal sinus rhythm.  Patient also with new murmur, c/w aortic stenosis on admission.  Echo 60 to 65% LVEF, no LV wall motion abnormalities.  No palpitations overnight, and no murmur auscultated this a.m.  Both likely reactive from severe anemia vs acquired coagulopathy, need to rule out structural cardiac abnormalities. ?- Continuous heart monitoring ?- Follow-up echocardiogram ?- If structural abnormality present, will consider cardiology consult ?  ?#History of hypertension ?A.m. BP 142/80.  Lifestyle controlled, no longer taking antihypertensives. ?- Continue to monitor ? ?Prior to Admission Living Arrangement: Home ?Anticipated Discharge Location: Home ?Barriers to Discharge: Clinical improvement ?Dispo: Anticipated discharge in approximately 3-5 day(s).  ? August, MD ?01/13/2022, 11:23 AM ?Pager: [phone] ?After 5pm on weekdays and 1pm on  weekends: On Call pager [phone]  ?

## 2022-01-13 NOTE — Progress Notes (Addendum)
Internal Medicine Attending:  ? ?I saw and examined the patient. I reviewed the resident?s H&P note and I agree with the resident?s findings and plan as documented in the resident?s note. ? ?In brief: Patient is a 36 year old male with a past medical history of hypertension, alcohol use disorder, cirrhosis with esophageal varices and portal hypertension who presented to the ED for hemoglobin of 3.5 noted at his PCPs office.  Patient states that he has been waking up in the middle of the night secondary to palpitations and shortness of breath over the last 3 months and went to see his PCP for this.  Patient was noted to have a hemoglobin of 3.5 at his PCPs office and was referred to the ED for further evaluation.  Of note, patient also complains of swelling in his legs for the last 4 to 6 weeks.  Patient also complains of intermittent bleeding in his gums for the last 3 to 4 months.  Today, patient states that he feels better and was able to sleep last night without any palpitations or shortness of breath. ? ?On exam, patient is resting in bed in no apparent distress.  Lungs are clear to auscultation bilaterally.  Cardiac exam reveals regular rate and rhythm with a grade 2 out of 6 systolic murmur noted on exam.  Abdomen soft, nontender, nondistended with normal active bowel sounds.  Extremities show 2+ bilateral lower extremity pitting edema which is nontender to palpation.  Skin exam does not reveal any petechiae or ecchymosis.  HEENT exam does not reveal any gum bleeding currently, bilateral scleral icterus noted.  Patient is oriented x3 with no focal deficits.  Patient has a normal mood and affect. ? ?The etiology behind the patient's anemia is likely multifactorial.  He probably has had chronic intermittent bleeding from his esophageal varices site which is evidenced by his iron deficiency and microcytic anemia.  Patient will likely need GI work-up but has no active bleeding at this time.  If hemoglobin is not  increasing appropriately or decreases would consider inpatient GI consult otherwise may refer to GI as an outpatient.   ? 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  The etiology behind his hemolysis remains uncertain at this time.  Hematology evaluation was called for further input.  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  Patient did have a 2D echo which revealed an indeterminate number of aortic valve cusps.  If patient does have a bicuspid aortic valve this could cause mechanical shearing as well as an acquired von Willebrand's disease which would explain gum bleeding.  Will consider repeat limited echo to determine if he has a bicuspid valve. Patient may also have a component of DIC likely related to his underlying hematological condition and liver disease.   ? ?Will obtain CT chest abdomen and pelvis to rule out any malignant etiology per hematology recommendations.  Patient's hemoglobin is improved to 6.4 status post 4 units PRBC.  We will transfuse an additional 2 units PRBC as well as give the patient an iron infusion.  We will follow-up haptoglobin.  No further work-up at this time.  We will continue to monitor closely. ?

## 2022-01-13 NOTE — Progress Notes (Signed)
Patient received 2 units PRBC's for hemoglobin 3.8. After receiving blood,hemoglobin level now 4.6. Contacted MD. Orders for 2 additional units were placed. Hemoglobin level will be drawn per protocol following completion of blood.  ?

## 2022-01-13 NOTE — Consult Note (Signed)
?Reason for the request:    Anemia ? ?HPI: I was asked by Dr. Earlie Raveling to evaluate [name] for evaluation of severe anemia.  He is a 36 year old man with a cirrhosis of the liver and esophageal varices in addition to history of alcohol abuse.  He reports that he has been abstaining at this time.  He was hospitalized on January 12, 2022 after presenting with severe anemia.  His hemoglobin was 3.5 at his primary care physician and was repeated and was 3.8.  His MCV was 65 with elevated RDW and a platelet count of 136.  White cell count was 4.8.  He received packed red cell transfusion and repeat CBC on January 13, 2022 showed a white cell count of 5.9 hemoglobin of 6.4 and a platelet count of 113.  He had that nucleated red cells that are elevated.  He was noted to have elevated bilirubin total 4.6 with 2.8 that is indirect and 1.8 that is direct.  LDH was elevated to 379 his iron is low at 44 with ferritin of 12 and saturation of 12%.  His vitamin B12 is elevated at 1279 and normal folate.  His D-dimer is positive with fibrinogen decreased at 194 his PT and PTT is elevated.  His DAT is negative with haptoglobin is pending. ? ?His peripheral smear shows nucleated red cells, schistocytes, teardrop cells, ovalocytes and polychromasia. ? ?Clinically, he reported some gum bleeding and lower extremity edema in the last month.  He has felt better overnight after transfusion.  He is not reporting any bleeding, bruising or petechiae. ? ? He does not report any headaches, blurry vision, syncope or seizures. Does not report any fevers, chills or sweats.  Does not report any cough, wheezing or hemoptysis.  Does not report any chest pain, palpitation, orthopnea or leg edema.  Does not report any nausea, vomiting or abdominal pain.  Does not report any constipation or diarrhea.  Does not report any skeletal complaints.    Does not report frequency, urgency or hematuria.  Does not report any skin rashes or lesions. Does not report  any heat or cold intolerance.  Does not report any lymphadenopathy or petechiae.  Does not report any anxiety or depression.  Remaining review of systems is negative.  ? ? ? ? ? ?Past Surgical History:  ?Procedure Laterality Date  ? ESOPHAGOGASTRODUODENOSCOPY N/A 04/26/2021  ? Procedure: ESOPHAGOGASTRODUODENOSCOPY (EGD);  Surgeon: Willis Modena, MD;  Location: [facility];  Service: Endoscopy;  Laterality: N/A;  ? MANDIBLE FRACTURE SURGERY    ?: ? ? ?Current Facility-Administered Medications:  ?  0.9 %  sodium chloride infusion (Manually program via Guardrails IV Fluids), , Intravenous, Once, Quincy Simmonds, MD ?  pantoprazole (PROTONIX) EC tablet 40 mg, 40 mg, Oral, Daily, Quincy Simmonds, MD, 40 mg at 01/12/22 1520: ? ?No Known Allergies: ? ?No family history on file.: ? ? ?Social History  ? ?Socioeconomic History  ? Marital status: Single  ?  Spouse name: Not on file  ? Number of children: Not on file  ? Years of education: Not on file  ? Highest education level: Not on file  ?Occupational History  ? Not on file  ?Tobacco Use  ? Smoking status: Never  ? Smokeless tobacco: Never  ?Vaping Use  ? Vaping Use: Every day  ?Substance and Sexual Activity  ? Alcohol use: Not Currently  ?  Comment: 3-4 times a week.   ? Drug use: No  ? Sexual activity: Not on file  ?Other  Topics Concern  ? Not on file  ?Social History Narrative  ? Not on file  ? ?Social Determinants of Health  ? ?Financial Resource Strain: Not on file  ?Food Insecurity: Not on file  ?Transportation Needs: Not on file  ?Physical Activity: Not on file  ?Stress: Not on file  ?Social Connections: Not on file  ?Intimate Partner Violence: Not on file  ?: ? ?Pertinent items are noted in HPI. ? ?Exam: ?Blood pressure (!) 142/80, pulse 80, temperature 98 ?F (36.7 ?C), temperature source Oral, resp. rate 18, height 5\' 10"  (1.778 m), weight 224 lb 10.4 oz (101.9 kg), SpO2 98 %. ? ?General appearance: alert and cooperative appeared without distress. ?Head: atraumatic  without any abnormalities. ?Eyes: conjunctivae/corneas clear. PERRL.  Sclera anicteric. ?Throat: Mucosal bleeding noted at the gums. ?Resp: clear to auscultation bilaterally without rhonchi, wheezes or dullness to percussion. ?Cardio: regular rate and rhythm, S1, S2 normal.  Bilateral lower extremity edema noted. ?GI: soft, non-tender; bowel sounds normal; no masses,  no organomegaly ?Skin: Skin color, texture, turgor normal. No rashes or lesions ?Lymph nodes: Cervical, supraclavicular, and axillary nodes normal. ?Neurologic: Grossly normal without any motor, sensory or deep tendon reflexes. ?Musculoskeletal: No joint deformity or effusion. ? ? ?Recent Labs  ?  01/12/22 ?2013 01/13/22 ?0719  ?WBC 5.0 5.9  ?HGB 4.6* 6.4*  ?HCT 15.7* 20.5*  ?PLT 121* 113*  ? ? ?Recent Labs  ?  01/12/22 ?0959 01/13/22 ?0719  ?NA 138 136  ?K 3.7 3.7  ?CL 107 108  ?CO2 25 20*  ?GLUCOSE 110* 87  ?BUN <5* <5*  ?CREATININE 0.71 0.61  ?CALCIUM 8.0* 8.1*  ? ? ? ? ?DG Chest 2 View ? ?Result Date: 01/12/2022 ?CLINICAL DATA:  36 year old male with history of shortness of breath. EXAM: CHEST - 2 VIEW COMPARISON:  No priors. FINDINGS: Lung volumes are normal. No consolidative airspace disease. No pleural effusions. No pneumothorax. No evidence of pulmonary edema. Heart size is enlarged. Upper mediastinal contours are within normal limits. IMPRESSION: 1. No radiographic evidence of acute cardiopulmonary disease. 2. Cardiomegaly. Electronically Signed   By: 20 M.D.   On: 01/12/2022 10:37  ? ?ECHOCARDIOGRAM COMPLETE ? ?Result Date: 01/12/2022 ?   ECHOCARDIOGRAM REPORT   Patient Name:   [name] Date of Exam: 01/12/2022 Medical Rec #:  01/14/2022      Height:       69.0 in Accession #:    [id number]     Weight:       217.0 lb Date of Birth:  02-Sep-1986       BSA:          2.139 m? Patient Age:    36 years       BP:           135/78 mmHg Patient Gender: M              HR:           84 bpm. Exam Location:  Inpatient Procedure: 2D Echo,  Cardiac Doppler and Color Doppler Indications:    Murmur  History:        Patient has no prior history of Echocardiogram examinations.  Sonographer:    02/02/1986 RDCS Referring Phys: [id number] DANIELLE RAY IMPRESSIONS  1. Left ventricular ejection fraction, by estimation, is 60 to 65%. The left ventricle has normal function. The left ventricle has no regional wall motion abnormalities. Left ventricular diastolic parameters were normal.  2. Right ventricular systolic function is normal.  The right ventricular size is normal. Tricuspid regurgitation signal is inadequate for assessing PA pressure.  3. Left atrial size was mildly dilated.  4. The mitral valve is normal in structure. Trivial mitral valve regurgitation. No evidence of mitral stenosis.  5. The aortic valve has an indeterminant number of cusps. Aortic valve regurgitation is not visualized. No aortic stenosis is present.  6. The inferior vena cava is dilated in size with <50% respiratory variability, suggesting right atrial pressure of 15 mmHg. FINDINGS  Left Ventricle: Left ventricular ejection fraction, by estimation, is 60 to 65%. The left ventricle has normal function. The left ventricle has no regional wall motion abnormalities. The left ventricular internal cavity size was normal in size. There is  no left ventricular hypertrophy. Left ventricular diastolic parameters were normal. Right Ventricle: The right ventricular size is normal. No increase in right ventricular wall thickness. Right ventricular systolic function is normal. Tricuspid regurgitation signal is inadequate for assessing PA pressure. Left Atrium: Left atrial size was mildly dilated. Right Atrium: Right atrial size was normal in size. Pericardium: There is no evidence of pericardial effusion. Mitral Valve: The mitral valve is normal in structure. Trivial mitral valve regurgitation. No evidence of mitral valve stenosis. Tricuspid Valve: The tricuspid valve is normal in structure. Tricuspid  valve regurgitation is not demonstrated. No evidence of tricuspid stenosis. Aortic Valve: The aortic valve has an indeterminant number of cusps. Aortic valve regurgitation is not visualized. No aortic sten

## 2022-01-14 DIAGNOSIS — D649 Anemia, unspecified: Secondary | ICD-10-CM | POA: Diagnosis not present

## 2022-01-14 LAB — BPAM RBC
Blood Product Expiration Date: 202305082359
Blood Product Expiration Date: 202305132359
Blood Product Expiration Date: 202305132359
Blood Product Expiration Date: 202305132359
Blood Product Expiration Date: 202305162359
Blood Product Expiration Date: 202305162359
ISSUE DATE / TIME: 202304151237
ISSUE DATE / TIME: 202304151614
ISSUE DATE / TIME: 202304152314
ISSUE DATE / TIME: 202304160217
ISSUE DATE / TIME: 202304161103
ISSUE DATE / TIME: 202304162029
Unit Type and Rh: 5100
Unit Type and Rh: 5100
Unit Type and Rh: 5100
Unit Type and Rh: 5100
Unit Type and Rh: 5100
Unit Type and Rh: 5100

## 2022-01-14 LAB — TYPE AND SCREEN
ABO/RH(D): O POS
Antibody Screen: NEGATIVE
Unit division: 0
Unit division: 0
Unit division: 0
Unit division: 0
Unit division: 0
Unit division: 0

## 2022-01-14 LAB — HEPATIC FUNCTION PANEL
ALT: 21 U/L (ref 0–44)
AST: 62 U/L — ABNORMAL HIGH (ref 15–41)
Albumin: 2.3 g/dL — ABNORMAL LOW (ref 3.5–5.0)
Alkaline Phosphatase: 70 U/L (ref 38–126)
Bilirubin, Direct: 3.3 mg/dL — ABNORMAL HIGH (ref 0.0–0.2)
Indirect Bilirubin: 5.4 mg/dL — ABNORMAL HIGH (ref 0.3–0.9)
Total Bilirubin: 8.7 mg/dL — ABNORMAL HIGH (ref 0.3–1.2)
Total Protein: 8.2 g/dL — ABNORMAL HIGH (ref 6.5–8.1)

## 2022-01-14 LAB — CBC
HCT: 24.3 % — ABNORMAL LOW (ref 39.0–52.0)
HCT: 25.9 % — ABNORMAL LOW (ref 39.0–52.0)
Hemoglobin: 7.6 g/dL — ABNORMAL LOW (ref 13.0–17.0)
Hemoglobin: 8 g/dL — ABNORMAL LOW (ref 13.0–17.0)
MCH: 23.2 pg — ABNORMAL LOW (ref 26.0–34.0)
MCH: 23.3 pg — ABNORMAL LOW (ref 26.0–34.0)
MCHC: 31.3 g/dL (ref 30.0–36.0)
MCHC: 30.9 g/dL (ref 30.0–36.0)
MCV: 74.3 fL — ABNORMAL LOW (ref 80.0–100.0)
MCV: 75.3 fL — ABNORMAL LOW (ref 80.0–100.0)
Platelets: 103 10*3/uL — ABNORMAL LOW (ref 150–400)
Platelets: 123 10*3/uL — ABNORMAL LOW (ref 150–400)
RBC: 3.27 MIL/uL — ABNORMAL LOW (ref 4.22–5.81)
RBC: 3.44 MIL/uL — ABNORMAL LOW (ref 4.22–5.81)
RDW: 26.9 % — ABNORMAL HIGH (ref 11.5–15.5)
RDW: 26.7 % — ABNORMAL HIGH (ref 11.5–15.5)
WBC: 6.5 10*3/uL (ref 4.0–10.5)
WBC: 7.1 10*3/uL (ref 4.0–10.5)
nRBC: 2.1 % — ABNORMAL HIGH (ref 0.0–0.2)
nRBC: 3.5 % — ABNORMAL HIGH (ref 0.0–0.2)

## 2022-01-14 LAB — BASIC METABOLIC PANEL
Anion gap: 8 (ref 5–15)
BUN: 5 mg/dL — ABNORMAL LOW (ref 6–20)
CO2: 20 mmol/L — ABNORMAL LOW (ref 22–32)
Calcium: 8.2 mg/dL — ABNORMAL LOW (ref 8.9–10.3)
Chloride: 105 mmol/L (ref 98–111)
Creatinine, Ser: 0.77 mg/dL (ref 0.61–1.24)
GFR, Estimated: >60 mL/min (ref >60)
Glucose, Bld: 87 mg/dL (ref 70–99)
Potassium: 3.4 mmol/L — ABNORMAL LOW (ref 3.5–5.1)
Sodium: 133 mmol/L — ABNORMAL LOW (ref 135–145)

## 2022-01-14 MED ORDER — WHITE PETROLATUM EX OINT
TOPICAL_OINTMENT | CUTANEOUS | Status: DC | PRN
  Filled 2022-01-14: qty 28.35

## 2022-01-14 NOTE — Plan of Care (Signed)
  Problem: Education: Goal: Knowledge of General Education information will improve Description Including pain rating scale, medication(s)/side effects and non-pharmacologic comfort measures Outcome: Progressing   

## 2022-01-14 NOTE — Progress Notes (Signed)
Events overnight noted and laboratory data reviewed.  His hemoglobin is up to 7.6 but he appears to have received more transfusion.  His white cell count of 6.5 and a platelet count of 103.  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 ? ?CT scan chest abdomen and pelvis did not show any large tumor or malignancy.  He does have splenomegaly and esophageal varices associated with hepatic cirrhosis. ? ?At this time, I do not suggest any further work-up but monitor his labs in the next 24 to 48 hours. ? ?I will continue to monitor his CBC, LDH, bilirubin and coags daily.  If the hemolytic process keeps ongoing, would recommend cardiology evaluation for a possible bicuspid aortic valve and potentially a bone marrow biopsy. ? ?We will continue to follow. ?

## 2022-01-14 NOTE — Progress Notes (Signed)
?  Transition of Care (TOC) Screening Note ? ? ?Patient Details  ?Name: [name] ?Date of Birth: 04/12/1986 ? ? ?Transition of Care (TOC) CM/SW Contact:    ?Tom-Johnson, Hershal Coria, RN ?Phone Number: ?01/14/2022, 1:39 PM ? ?Patient is from home with wife and daughter. Admitted for Symptomatic Anemia. Has Hx of Alcohol Abuse, Cirrhosis of the liver and Esophageal Varices. Hgb on admission was 3.5. Has received 4 unit PRBC and Hgb today is 7.6. Oncology following for Anemia. ?Patient is not currently employed and not on disability. Has both parents and one brother whom are supportive with care.  ?PCP is Sagardia, Eilleen Kempf, MD and uses [facility] on [address].  ?Transition of Care Department [facility]) has reviewed patient and no TOC needs or recommendations have been identified at this time. TOC will continue to monitor patient advancement through interdisciplinary progression rounds. If new patient transition needs arise, please place a TOC consult. ?  ?

## 2022-01-14 NOTE — Progress Notes (Signed)
? ?Subjective: ? ?NAEO. Patient states he is doing well. States he slept well, had no palpitations or chest pain at night. He has been able to ambulate well with no dizziness or SOB. States he is still having some gum bleeding but it is less in volume and clotting quicker than usual. States it now takes 3min to clot and stops bleeding, when previously it bled all day. ? ?States had some darkening of urine after he was given 2x units of pRBC and 1x IV iron. States this morning urine is lighter. He is urinating well and stooling well. Imaging results discussed, need for further work up discussed. Patient agreeable, all questions answered. ? ?Objective: ? ?Vital signs in last 24 hours: ?Vitals:  ? 01/13/22 2035 01/13/22 2056 01/14/22 0015 01/14/22 0500  ?BP: (!) 146/76 (!) 145/74 (!) 155/89 (!) 156/84  ?Pulse: 89 87 84 85  ?Resp: 18 18  20   ?Temp: 98.7 ?F (37.1 ?C) 99.2 ?F (37.3 ?C) 99.1 ?F (37.3 ?C) 99 ?F (37.2 ?C)  ?TempSrc: Oral Oral Oral Oral  ?SpO2: 98% 99% 98% 99%  ?Weight:      ?Height:      ? ?Weight change:  ? ?Intake/Output Summary (Last 24 hours) at 01/14/2022 0843 ?Last data filed at 01/14/2022 0200 ?Gross per 24 hour  ?Intake 2150 ml  ?Output 1200 ml  ?Net 950 ml  ? ?Physical Exam: ?Constitutional: Young male sitting comfortably at bed side chair, conversant, NAD ?HEENT: atraumatic, normocephalic, bilateral scleral icterus, small blood clot on lower teeth, no active bleeding ?Cardio: Regular rate and rhythm, loud s1/s2, no murmurs or rubs ?Pulm: CTAB, normal work of breathing on room air, no wheezing or crackles ?Abd: soft, nondistended, nontender, +bowel sounds ?Extremities: Hyperpigmentation of bilateral LE from ankle to below knee unchanged from yesterday, no pain to palpation, 1+ edema to bilateral LE with improvement to swelling ?Neuro: alert and oriented x3, answering questions appropriately ?Psych:normal mood and affect ? ?Assessment/Plan: ? ?Principal Problem: ?  Symptomatic anemia ? ?[name] is a 36 year old male with PMHx lifestyle managed HTN and alcohol use disorder-in remission, complicated by esophageal varices with portal hypertension and cirrhosis who presented from PCP with critical Hgb 3.5, palpitations, and dyspnea 2/2 multifactorial serve microcytic anemia, now s/p 6 units PRBCs  and 1x IV iron with sx improvement .  ?  ?Principal Problem: ?  Symptomatic anemia ?  ?#Symptomatic microcytic anemia, multifactorial ?#Thrombocytopenia ?#History of bleeding esophageal varices ?Patient with symptomatic microcytic anemia likely multifactorial, now s/p 6 units PRBCs and 1x IV iron, with post transfusion Hgb of 7.6 from 3.8 on admission. This morning he reports feeling better with improved gum bleeding and no longer endorsing palpitations or dyspnea at night. CT chest/abd/pelvis obtained to look for source of possible malignancy as precipitating source of anemia. No large tumor/malignancy identified on imaging. 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 His indirect bili continues to trend upwards on labs, at 5.4 today from 4.6 (4/16). This likely indicating continued hemolysis. Patient found to have indeterminate number of aortic valve cusps on most recent echo which could be source of mechanical hemolysis if found to be bicuspid vs intramedullary hemolysis. Could also be source of acquired coagulopathy, VWD, which could explain intermittent gum bleeds. Will order TEE to further evaluate aortic valve as source for hemolysis. PNH can also cause hemolysis and was considered given amber colored urine with small Hgb on UA. PNH can lead to bone marrow dysfunction and  could explain the cytopenias seen in this patient. Will obtain flow cytometry to evaluate. He has a hx of esophageal varices, could be potential source of chronic bleeding likely contributory, given iron deficiency seen on lab. He is with no signs of active bleeding at this moment, but would  benefit from outpatient GI given hx of esophageal varices. GI evaluation will be considered this admission if Hgb does not continue to improve. Thrombocytopenia ongoing since July 2022: 136 on admission, increased from 122 in July. Continue to down trend during this admission, today at 103. Patient may also have a component of DIC likely related to his underlying hematological condition and liver disease. Will CTM. Hematology currently following and reccomends no further work-up, will continue monitoring with daily labs as below per recs.  ?-- Heme/onc following, appreciate recs ?- s/p 6 units PRBCs and Iron infusion with ferric gluconate 250 mg IV x 1 ?- Follow-up PM H&H ?-- f/u flow cytometry, TEE ?-- Daily CBC, LDH, bilirubin and coags ?- Transfuse for hemoglobin <7 ?- Continue pantoprazole 40 mg daily for prophylaxis ?  ?#Palpitations, improved ?#New murmur, resolved ?Patient reports palpitations waking him up nightly for the past 3 months.  Denies feeling throughout the day.  EKG with normal sinus rhythm.  Patient also with new murmur, c/w aortic stenosis on admission.  Echo 60 to 65% LVEF, no LV wall motion abnormalities.  No palpitations overnight, and no murmur auscultated this a.m.  Both likely reactive from severe anemia vs acquired coagulopathy, need to rule out structural cardiac abnormalities as above. ?- Continuous heart monitoring ?- Follow-up echocardiogram ?- If structural abnormality present, will consider cardiology consult ?  ?#History of hypertension ?AM BP 156/84.  Lifestyle controlled, no longer taking antihypertensives. Will consider adding antihypertensive if BP continues to increase. ?- Continue to monitor ?  ?Prior to Admission Living Arrangement: Home ?Anticipated Discharge Location: Home ?Barriers to Discharge: Clinical improvement ?Dispo: Anticipated discharge in approximately 3-5 day(s).  ? ? LOS: 2 days  ? ?Rodriguez-Teodoro, [name], Medical Student ?01/14/2022, 8:43 AM  ?

## 2022-01-15 DIAGNOSIS — D649 Anemia, unspecified: Secondary | ICD-10-CM | POA: Diagnosis not present

## 2022-01-15 LAB — HEPATIC FUNCTION PANEL
ALT: 20 U/L (ref 0–44)
AST: 54 U/L — ABNORMAL HIGH (ref 15–41)
Albumin: 2.3 g/dL — ABNORMAL LOW (ref 3.5–5.0)
Alkaline Phosphatase: 72 U/L (ref 38–126)
Bilirubin, Direct: 3.3 mg/dL — ABNORMAL HIGH (ref 0.0–0.2)
Indirect Bilirubin: 4.8 mg/dL — ABNORMAL HIGH (ref 0.3–0.9)
Total Bilirubin: 8.1 mg/dL — ABNORMAL HIGH (ref 0.3–1.2)
Total Protein: 8.4 g/dL — ABNORMAL HIGH (ref 6.5–8.1)

## 2022-01-15 LAB — APTT: aPTT: 49 seconds — ABNORMAL HIGH (ref 24–36)

## 2022-01-15 LAB — PROTIME-INR
INR: 2.2 — ABNORMAL HIGH (ref 0.8–1.2)
Prothrombin Time: 24.1 seconds — ABNORMAL HIGH (ref 11.4–15.2)

## 2022-01-15 LAB — LACTATE DEHYDROGENASE: LDH: 219 U/L — ABNORMAL HIGH (ref 98–192)

## 2022-01-15 LAB — PATHOLOGIST SMEAR REVIEW

## 2022-01-15 NOTE — Progress Notes (Signed)
? ?Subjective: ? ?NAEO. Patient states he is doing well this AM and would like to go home soon. No chest pain or palpitations overnight. He continues to ambulate without issues. He states urine was a little dark last night but thinks this might be due to increased soda intake as it cleared up after he drank more water. No issues with voiding or stooling. States he had no gum bleeding today. Discussed pending lab work, answered all questions, no further concerns at this moment. ? ?Objective: ? ?Vital signs in last 24 hours: ?Vitals:  ? 01/14/22 0932 01/14/22 1805 01/14/22 2241 01/15/22 0856  ?BP: (!) 147/82 (!) 154/87 (!) 144/78 (!) 147/83  ?Pulse: 83 85 90 82  ?Resp: _0 ?Temp: 98.9 ?F (37.2 ?C) 99.3 ?F (37.4 ?C) 98.1 ?F (36.7 ?C)   ?TempSrc:   Oral   ?SpO2: 100% 100% 98% 99%  ?Weight:      ?Height:      ? ?Weight change:  ? ?Intake/Output Summary (Last 24 hours) at 01/15/2022 1334 ?Last data filed at 01/15/2022 0600 ?Gross per 24 hour  ?Intake 357 ml  ?Output --  ?Net 357 ml  ? ?Physical Exam: ?Constitutional: Young male laying on bedside couch, conversant, NAD ?HEENT: atraumatic, normocephalic, bilateral scleral icterus, no active gum bleeding ?Cardio: Regular rate and rhythm, soft 1-4/2 systolic murmur ?Pulm: CTAB, normal work of breathing on room air, no wheezing or crackles ?Abd: soft, nondistended, nontender, +bowel sounds ?Extremities: Hyperpigmentation of bilateral LE from ankle to below knee unchanged from yesterday, no pain to palpation, no edema ?Neuro: alert and oriented x3, answering questions appropriately ?Psych:normal mood and affect ? ?Assessment/Plan: ? ?Principal Problem: ?  Symptomatic anemia ? ?[name] is a 36 year old male with PMHx lifestyle managed HTN and alcohol use disorder-in remission, complicated by esophageal varices with portal hypertension and cirrhosis who presented from PCP with critical Hgb 3.5, palpitations, and dyspnea 2/2 multifactorial serve microcytic anemia, now  stable with sx improvement.  ?  ?Principal Problem: ?  Symptomatic anemia ?  ?#Symptomatic microcytic anemia, multifactorial ?#Thrombocytopenia ?#History of bleeding esophageal varices ?Patient with symptomatic microcytic anemia likely multifactorial, now s/p 6 units PRBCs and 1x IV iron. Hgb has remained stable at 8 from 3.8 on admission (4/15) with thrombocytopenia improved to 123. This morning he reports feeling better with no gum bleeding and no repeat episodes of palpitations or dyspnea at night. 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 Indirect bili and LDH decreased today indicating some improvement in hemolytic process. Patient unable to undergo TEE to further evaluate aortic valve as source of hemolysis due to high risk of bleeding given hx of esophageal varices. Continue to think possible bicuspid aortic valve could be source of mechanical hemolysis vs intramedullary hemolysis or potential source of acquired coagulopathy, VWD, which could explain intermittent gum bleeds. Will consider repeat TTE to evaluate valve. Joshua continues to be on the differential given presentation, PNH profile/flow cytometry pending.  Will consider bone marrow biopsy if cardiac/PNH workup negative. He has a hx of esophageal varices likely contributing to iron deficiency seen on labs; no signs of active bleeding at this time, will need outpatient GI. Patient now stable with sx improvement, likely candidate for discharge soon. Hematology following, no further work-up, will continue monitoring with daily labs as below per recs.  ?-- Heme/onc following, appreciate recs ?- s/p 6 units PRBCs and Iron infusion with ferric gluconate 250 mg IV x 1 ?--  f/u PNH profile, flow cytometry ?-- Daily CBC, LDH, bilirubin and coags ?- Transfuse for hemoglobin <7 ?- Continue pantoprazole 40 mg daily for prophylaxis ?  ?#Palpitations, resolved ?#New murmur ?Patient reports palpitations waking him  up nightly for the past 3 months. On admission with EKG in NSR and new murmur, c/w aortic stenosis. Echo 60 to 65% LVEF, no LV wall motion abnormalities.  Continues to deny repeat episodes of palpitations overnight. Soft 3-7/9 systolic murmur on exam today, could be secondary to valve abnormality as above, unclear at this time will need to rule out structural cardiac abnormalities as above. Palpitations likely reactive from severe anemia vs acquired coagulopathy, now improved with post 6x pRBC transfusion and stable Hgb. ?  ?#History of hypertension ?AM BP 147/83.  Lifestyle controlled, no longer taking antihypertensives. Will consider adding antihypertensive if BP continues to increase. ?- Continue to monitor ?  ?Prior to Admission Living Arrangement: Home ?Anticipated Discharge Location: Home ?Barriers to Discharge: Clinical improvement ?Dispo: Anticipated discharge in approximately 3-5 day(s).  ? ? LOS: 3 days  ? ?[name], [name], Medical Student ?01/15/2022, 1:34 PM  ?

## 2022-01-15 NOTE — Progress Notes (Signed)
Events overnight noted including reviewing his laboratory data.  Labs indicate slight improvement in his hemolytic process with LDH down currently 219.  His hemoglobin is stable with bilirubin decreasing including slight decline in his indirect fractionation. ? ?His CBC from last night was reviewed and showed his hemoglobin remained stable slightly improved.  His platelets are better at 123.  PNH screen is currently pending and it would be reasonable to consider given his presentation. ? ?I recommend continued monitoring his hematological parameters on a daily basis.  I agree with the cardiac work-up to rule out nonimmune hemolytic process.  It is reasonable to defer a bone marrow biopsy for the time being. ? ?We will continue to follow. ?

## 2022-01-15 NOTE — Plan of Care (Signed)
  Problem: Health Behavior/Discharge Planning: Goal: Ability to manage health-related needs will improve Outcome: Progressing   

## 2022-01-16 ENCOUNTER — Other Ambulatory Visit: Payer: Self-pay | Admitting: Oncology

## 2022-01-16 ENCOUNTER — Telehealth: Payer: Self-pay | Admitting: Oncology

## 2022-01-16 DIAGNOSIS — D649 Anemia, unspecified: Secondary | ICD-10-CM | POA: Diagnosis not present

## 2022-01-16 LAB — PROTIME-INR
INR: 2.1 — ABNORMAL HIGH (ref 0.8–1.2)
Prothrombin Time: 23 seconds — ABNORMAL HIGH (ref 11.4–15.2)

## 2022-01-16 LAB — HEPATIC FUNCTION PANEL
ALT: 21 U/L (ref 0–44)
AST: 54 U/L — ABNORMAL HIGH (ref 15–41)
Albumin: 2.2 g/dL — ABNORMAL LOW (ref 3.5–5.0)
Alkaline Phosphatase: 75 U/L (ref 38–126)
Bilirubin, Direct: 3.5 mg/dL — ABNORMAL HIGH (ref 0.0–0.2)
Indirect Bilirubin: 4.3 mg/dL — ABNORMAL HIGH (ref 0.3–0.9)
Total Bilirubin: 7.8 mg/dL — ABNORMAL HIGH (ref 0.3–1.2)
Total Protein: 7.9 g/dL (ref 6.5–8.1)

## 2022-01-16 LAB — CBC
HCT: 26.9 % — ABNORMAL LOW (ref 39.0–52.0)
Hemoglobin: 8.3 g/dL — ABNORMAL LOW (ref 13.0–17.0)
MCH: 23.4 pg — ABNORMAL LOW (ref 26.0–34.0)
MCHC: 30.9 g/dL (ref 30.0–36.0)
MCV: 76 fL — ABNORMAL LOW (ref 80.0–100.0)
Platelets: 136 10*3/uL — ABNORMAL LOW (ref 150–400)
RBC: 3.54 MIL/uL — ABNORMAL LOW (ref 4.22–5.81)
RDW: 27.9 % — ABNORMAL HIGH (ref 11.5–15.5)
WBC: 8.3 10*3/uL (ref 4.0–10.5)
nRBC: 2.8 % — ABNORMAL HIGH (ref 0.0–0.2)

## 2022-01-16 LAB — APTT: aPTT: 45 seconds — ABNORMAL HIGH (ref 24–36)

## 2022-01-16 LAB — LACTATE DEHYDROGENASE: LDH: 225 U/L — ABNORMAL HIGH (ref 98–192)

## 2022-01-16 SURGERY — ECHOCARDIOGRAM, TRANSESOPHAGEAL
Anesthesia: Monitor Anesthesia Care

## 2022-01-16 NOTE — Progress Notes (Signed)
Laboratory data in the last 24 hours reviewed.  His total bilirubin is dropped slightly including drop in his indirect bilirubin.  His LDH remains relatively stable.  His hemoglobin continues to improve with platelets are improving slowly as well.  The number of nucleated red cells are also declining. ? ?The etiology of his nonimmune hemolysis remains unclear.  It is very possible while we are dealing with hypersplenism from his cirrhosis of the liver.  Other etiologies under consideration could be cardiac reason related to valvular leak versus a primary hematological disorder.  Given the improvement in his hematological parameters, the likelihood of a bone marrow process is becoming less likely. ? ?I recommend continued supportive management and observation at this time.  It is reasonable to defer bone marrow biopsy as long as he is improving. ? ?We will continue to follow. ?

## 2022-01-16 NOTE — Progress Notes (Signed)
DISCHARGE NOTE HOME ?[name] to be discharged Home per MD order. Discussed prescriptions and follow up appointments with the patient. Prescriptions given to patient; medication list explained in detail. Patient verbalized understanding. ? ?Skin clean, dry and intact without evidence of skin break down, no evidence of skin tears noted. IV catheter discontinued intact. Site without signs and symptoms of complications. Dressing and pressure applied. Pt denies pain at the site currently. No complaints noted. ? ?Patient free of lines, drains, and wounds.  ? ?An After Visit Summary (AVS) was printed and given to the patient. ?Patient escorted via wheelchair, and discharged home via private auto. ? ?Kristopher Glee, RN  ?

## 2022-01-16 NOTE — Discharge Summary (Signed)
? ?Name: [name] ?MRN: [id number] ?DOB: 11/05/1985 36 y.o. ?PCP: Joshua Pollen, MD ? ?Date of Admission: 01/12/2022  9:35 AM ?Date of Discharge:   01/16/2022 11:47 AM ?Attending Physician: Joshua Riches, MD ? ?Discharge Diagnosis: ?Principal Problem: ?  Symptomatic anemia ?  Non-autoimmune hemolytic anemia of unknown etiology ? ?Discharge Medications: ?Allergies as of 01/16/2022   ?No Known Allergies ?  ? ?  ?Medication List  ?  ? ?TAKE these medications   ? ?pantoprazole 40 MG tablet ?Commonly known as: Protonix ?Take 1 tablet (40 mg total) by mouth daily. ?  ? ?  ? ? ?Disposition and follow-up:   ?[name] was discharged from [facility] in Good condition.  At the hospital follow up visit please address: ? ?1.  Non-autoimmune hemolytic anemia: Etiology undetermined during hospitalization. Follow-up labs and continue workup as below. ?Possible bicuspid aortic valve: Indeterminate number of cusps on echo this admission. Advise repeat echo to determine if structural abnormalities could be the cause. ?Elevated BP: Follow-up BP outpatient, and consider anti-hypertensive therapy.  ? ?2.  Labs / imaging needed at time of follow-up: CBC, PT/INR, aPTT, LDH, Hepatic function panel; repeat TTE. ? ?3.  Pending labs/ test needing follow-up: PNH panel and flow cytometry. ? ?Follow-up Appointments: ? Follow-up Information   ? ? Joshua Portela, MD Follow up.   ?Specialty: Oncology ?Why: Office will reach out to you to schedule appointment. If you have not heard from them in a week, reach out to schedule. ?Contact information: ?[address] ?[place] [address] ?[phone] ? ? ?  ?  ? ?  ?  ? ?  ? ? ?Hospital Course by problem list: ?#Symptomatic non autoimmune hemolytic anemia ?#Thrombocytopenia ?#History of bleeding esophageal varices ?Patient with symptomatic non-autoimmune hemolytic anemia s/p 6 units PRBCs and 1x IV iron this admission. On day of discharge, Hgb 8.3 from  3.8 on admission (4/15) with thrombocytopenia improved to 136. Symptoms present on admission resolved by discharge. Indirect bili and LDH stable on day of discharge. Patient unable to undergo TEE to further evaluate aortic valve as source of hemolysis due to high risk of bleeding given hx of esophageal varices. Continue to think possible bicuspid aortic valve could be source of mechanical hemolysis vs intramedullary hemolysis or potential source of acquired coagulopathy, VWD, which could explain intermittent gum bleeds. Will consider repeat TTE to evaluate valve. [name] continues to be on the differential given presentation, PNH profile/flow cytometry pending.  Consider bone marrow biopsy if cardiac/PNH workup negative. He has a hx of esophageal varices likely contributing to iron deficiency seen on labs; no signs of active bleeding this admission. Discharge medication: Pantoprazole 40 mg daily for prophylaxis. Patient to follow-up in approx 1 week with Dr. Alen [name], heme/onc. ?  ?#Palpitations, resolved ?#New murmur ?Patient reported palpitations waking him up nightly for the past 3 months. On admission with EKG in NSR and new murmur, c/w aortic stenosis. Echo 60 to 65% LVEF, no LV wall motion abnormalities.  Continued to deny repeat episodes of palpitations overnight during the entirety of admission. Soft 8-1/1 systolic murmur on exam, could be secondary to valve abnormality as above, unclear at this time will need to rule out structural cardiac abnormalities as above with repeat TTE. With improvements, palpitations were likely reactive from severe anemia vs acquired coagulopathy, now improved with post 6x pRBC transfusion and stable Hgb. Patient to follow-up outpatient with cardiology as scheduled with PCP. ?  ?#History of hypertension ?SBP 140s-150s  throughout admission.  Lifestyle controlled, no longer taking antihypertensives. Consider adding antihypertensive if BP continues to increase outpatient. ? ?Discharge  Subjective:  ?NAEON. Patient reports feeling well today. He denies lightheadedness, mucosal bleeding, palpitations, chest pain, dyspnea. He reports that his urine has been lighter in color through the night as well as this AM. ? ?Discharge Exam:   ?BP (!) 145/87 (BP Location: Left Arm)   Pulse 84   Temp 98 ?F (36.7 ?C) (Oral)   Resp 16   Ht '5\' 10"'  (1.778 m)   Wt 101.9 kg   SpO2 100%   BMI 32.23 kg/m?  ?Discharge exam:  ?Constitutional: Well-appearing young male sitting on edge of bed, conversant, NAD ?HEENT: atraumatic, normocephalic, bilateral scleral icterus, no active oral mucosal  bleeding ?Cardio: Regular rate and rhythm, soft 3-5/3 systolic murmur ?Pulm: CTAB, normal work of breathing on room air, no wheezing or crackles ?Abd: soft, nondistended, nontender, +bowel sounds ?Extremities: Hyperpigmentation of bilateral LE from ankle to below knee mildly unchanged from previous day, no pain to palpation, no edema ?Neuro: alert and oriented x3, answering questions appropriately ?Psych:normal mood and affect ? ?Pertinent Labs, Studies, and Procedures:  ? ?DG Chest 2 View ? ?Result Date: 01/12/2022 ?CLINICAL DATA:  36 year old male with history of shortness of breath. EXAM: CHEST - 2 VIEW COMPARISON:  No priors. FINDINGS: Lung volumes are normal. No consolidative airspace disease. No pleural effusions. No pneumothorax. No evidence of pulmonary edema. Heart size is enlarged. Upper mediastinal contours are within normal limits. IMPRESSION: 1. No radiographic evidence of acute cardiopulmonary disease. 2. Cardiomegaly. Electronically Signed   By: Vinnie Langton M.D.   On: 01/12/2022 10:37  ? ?CT CHEST ABDOMEN PELVIS W CONTRAST ? ?Result Date: 01/13/2022 ?CLINICAL DATA:  Hematologic malignancy, cirrhosis EXAM: CT CHEST, ABDOMEN, AND PELVIS WITH CONTRAST TECHNIQUE: Multidetector CT imaging of the chest, abdomen and pelvis was performed following the standard protocol during bolus administration of intravenous  contrast. RADIATION DOSE REDUCTION: This exam was performed according to the departmental dose-optimization program which includes automated exposure control, adjustment of the mA and/or kV according to patient size and/or use of iterative reconstruction technique. CONTRAST:  140m OMNIPAQUE IOHEXOL 300 MG/ML  SOLN COMPARISON:  04/26/2021 FINDINGS: CT CHEST FINDINGS Cardiovascular: The heart is enlarged without pericardial effusion. No evidence of thoracic aortic aneurysm or dissection. Mediastinum/Nodes: No pathologic adenopathy within the mediastinum, hila, or axillary regions. Thyroid, trachea, and esophagus are unremarkable. Lungs/Pleura: There are trace bilateral pleural effusions, right greater than left. Patchy nodular ground-glass airspace disease is seen on the right, most pronounced within the right lower lobe, consistent with nonspecific inflammation/infection, hemorrhage, or aspiration. Central airways are patent. Musculoskeletal: No acute or destructive bony lesions. Bilateral gynecomastia incidentally noted. Reconstructed images demonstrate no additional findings. CT ABDOMEN PELVIS FINDINGS Hepatobiliary: Subtle nodularity of the liver capsule consistent with cirrhosis. No focal liver abnormality identified on this single phase exam. Calcified gallstones are identified without cholecystitis. No biliary duct dilation. Pancreas: Unremarkable. No pancreatic ductal dilatation or surrounding inflammatory changes. Spleen: Stable splenomegaly, measuring 12.7 x 7.3 by 11.3 cm, consistent with portal venous hypertension in a patient with cirrhosis. No focal parenchymal abnormality. Adrenals/Urinary Tract: Adrenals are unremarkable. The kidneys enhance normally and symmetrically. No urinary tract calculi or obstructive uropathy. Bladder is grossly unremarkable. Stomach/Bowel: No bowel obstruction or ileus. A normal decompressed appendix is seen in the right lower quadrant. No bowel wall thickening or  inflammatory changes. Vascular/Lymphatic: Numerous gastric, esophageal, and splenic varices are seen within the upper abdomen  consistent with portal venous hypertension. There is recanalization of the umbilical vein, with small

## 2022-01-16 NOTE — Discharge Instructions (Addendum)
Dear Mr. Gregori, ?I am so glad you are feeling better and can be discharged today (01/16/2022)! You were admitted for severe anemia.  ? ?Please see the following instructions: ?Please see your primary care / family doctor for your medical issues, concerns and or health care needs. ?Please follow-up with the following specialists: ?Cardiology as scheduled by primary care doctor  ?Hematology, referral placed during this admission. Please reach out to their office if you have not heard back within a week. ? ?Report any adverse effects and or reactions from the medicines to your outpatient provider promptly. Do not engage in alcohol and or illegal drug use while on prescription medicines. In the event of worsening symptoms, call 911 and/or go to the nearest ED for appropriate evaluation and treatment of symptoms. ? ?It was a pleasure meeting you, Mr. Baudoin.  I wish you and your family the best, and hope you stay happy and healthy! ? ?Lamar Sprinkles, MD ?01/16/2022   ?

## 2022-01-16 NOTE — Telephone Encounter (Signed)
Per 4/19 in basket called and left pt a message about upcoming appointments.  Call back number was left if changes are needed ?

## 2022-01-16 NOTE — TOC Transition Note (Signed)
Transition of Care (TOC) - CM/SW Discharge Note ? ? ?Patient Details  ?Name: [name] ?MRN: [id number] ?Date of Birth: February 29, 1986 ? ?Transition of Care (TOC) CM/SW Contact:  ?Tom-Johnson, Hershal Coria, RN ?Phone Number: ?01/16/2022, 11:16 AM ? ? ?Clinical Narrative:    ? ?Patient is scheduled for discharge today. No TOC needs or recommendations noted. Denies any needs. Family to transport at discharge. No further TOC needs noted.  ?  ?  ? ? ?Patient Goals and CMS Choice ?  ?  ?  ? ?Discharge Placement ?  ?           ?  ?  ?  ?  ? ?Discharge Plan and Services ?  ?  ?           ?  ?  ?  ?  ?  ?  ?  ?  ?  ?  ? ?Social Determinants of Health (SDOH) Interventions ?  ? ? ?Readmission Risk Interventions ?   ? View : No data to display.  ?  ?  ?  ? ? ? ? ? ?

## 2022-01-17 LAB — PNH PROFILE (-HIGH SENSITIVITY)

## 2022-01-18 ENCOUNTER — Telehealth: Payer: Self-pay

## 2022-01-18 NOTE — Telephone Encounter (Signed)
Transition Care Management Unsuccessful Follow-up Telephone Call ? ?Date of discharge and from where:  01/16/2022  [name]  ? ?Attempts:  1st Attempt ? ?Reason for unsuccessful TCM follow-up call:  No answer/busy ? ?  ?

## 2022-01-21 ENCOUNTER — Inpatient Hospital Stay: Payer: Medicaid Other | Attending: Oncology | Admitting: Oncology

## 2022-01-21 ENCOUNTER — Inpatient Hospital Stay: Payer: Medicaid Other

## 2022-01-21 ENCOUNTER — Other Ambulatory Visit: Payer: Self-pay

## 2022-01-21 ENCOUNTER — Telehealth: Payer: Self-pay

## 2022-01-21 VITALS — BP 140/78 | HR 77 | Temp 98.1°F | Resp 18 | Ht 70.0 in | Wt 229.2 lb

## 2022-01-21 DIAGNOSIS — D649 Anemia, unspecified: Secondary | ICD-10-CM

## 2022-01-21 DIAGNOSIS — K7460 Unspecified cirrhosis of liver: Secondary | ICD-10-CM | POA: Insufficient documentation

## 2022-01-21 DIAGNOSIS — D696 Thrombocytopenia, unspecified: Secondary | ICD-10-CM | POA: Insufficient documentation

## 2022-01-21 DIAGNOSIS — Z79899 Other long term (current) drug therapy: Secondary | ICD-10-CM | POA: Insufficient documentation

## 2022-01-21 LAB — CMP (CANCER CENTER ONLY)
ALT: 22 U/L (ref 0–44)
AST: 66 U/L — ABNORMAL HIGH (ref 15–41)
Albumin: 2.7 g/dL — ABNORMAL LOW (ref 3.5–5.0)
Alkaline Phosphatase: 85 U/L (ref 38–126)
Anion gap: 3 — ABNORMAL LOW (ref 5–15)
BUN: 9 mg/dL (ref 6–20)
CO2: 28 mmol/L (ref 22–32)
Calcium: 8 mg/dL — ABNORMAL LOW (ref 8.9–10.3)
Chloride: 108 mmol/L (ref 98–111)
Creatinine: 0.75 mg/dL (ref 0.61–1.24)
GFR, Estimated: >60 mL/min (ref >60)
Glucose, Bld: 86 mg/dL (ref 70–99)
Potassium: 3.8 mmol/L (ref 3.5–5.1)
Sodium: 139 mmol/L (ref 135–145)
Total Bilirubin: 4.5 mg/dL (ref 0.3–1.2)
Total Protein: 8.3 g/dL — ABNORMAL HIGH (ref 6.5–8.1)

## 2022-01-21 LAB — RETIC PANEL
Immature Retic Fract: 14.4 % (ref 2.3–15.9)
RBC.: 3.3 MIL/uL — ABNORMAL LOW (ref 4.22–5.81)
Retic Count, Absolute: 106.2 10*3/uL (ref 19.0–186.0)
Retic Ct Pct: 3.2 % — ABNORMAL HIGH (ref 0.4–3.1)
Reticulocyte Hemoglobin: 18.7 pg — ABNORMAL LOW (ref >27.9)

## 2022-01-21 LAB — CBC WITH DIFFERENTIAL (CANCER CENTER ONLY)
Abs Immature Granulocytes: 0.02 10*3/uL (ref 0.00–0.07)
Basophils Absolute: 0.1 10*3/uL (ref 0.0–0.1)
Basophils Relative: 3 %
Eosinophils Absolute: 0.5 10*3/uL (ref 0.0–0.5)
Eosinophils Relative: 10 %
HCT: 26.4 % — ABNORMAL LOW (ref 39.0–52.0)
Hemoglobin: 8.1 g/dL — ABNORMAL LOW (ref 13.0–17.0)
Immature Granulocytes: 0 %
Lymphocytes Relative: 20 %
Lymphs Abs: 1 10*3/uL (ref 0.7–4.0)
MCH: 23.9 pg — ABNORMAL LOW (ref 26.0–34.0)
MCHC: 30.7 g/dL (ref 30.0–36.0)
MCV: 77.9 fL — ABNORMAL LOW (ref 80.0–100.0)
Monocytes Absolute: 0.7 10*3/uL (ref 0.1–1.0)
Monocytes Relative: 15 %
Neutro Abs: 2.5 10*3/uL (ref 1.7–7.7)
Neutrophils Relative %: 52 %
Platelet Count: 134 10*3/uL — ABNORMAL LOW (ref 150–400)
RBC: 3.39 MIL/uL — ABNORMAL LOW (ref 4.22–5.81)
RDW: 30 % — ABNORMAL HIGH (ref 11.5–15.5)
Smear Review: NORMAL
WBC Count: 4.8 10*3/uL (ref 4.0–10.5)
nRBC: 0 % (ref 0.0–0.2)

## 2022-01-21 LAB — LACTATE DEHYDROGENASE: LDH: 241 U/L — ABNORMAL HIGH (ref 98–192)

## 2022-01-21 LAB — SAVE SMEAR(SSMR), FOR PROVIDER SLIDE REVIEW

## 2022-01-21 NOTE — Progress Notes (Signed)
Hematology and Oncology Follow Up Visit ? ?[name] ?[id number] ?Mar 24, 1986 36 y.o. ?01/21/2022 10:30 AM ?[name], [place], [place], *  ? ?Principle Diagnosis: 36 year old with severe anemia and thrombocytopenia diagnosed in April 2023.  He had an element of nonimmune mediated hemolysis that required hospitalization and transfusion.  This was noted in the setting of cirrhosis of the liver. ? ? ?Prior Therapy: Packed red cell transfusion during his hospitalization of April 2023. ? ?Current therapy: Active surveillance. ? ?Interim History: [name] returns today for a follow-up visit.  He was discharged on January 16, 2022 with hemoglobin that was stable at 8.3 and a platelet count of 136.  This is discharge, he reports feeling well without any complaints.  He denies any active bleeding complications.  He denies any hematochezia, melena or hemoptysis.  He denies any easy bruising.  He does report lower extremity swelling. ? ? ? ? ?Medications: I have reviewed the patient's current medications.  ?Current Outpatient Medications  ?Medication Sig Dispense Refill  ? pantoprazole (PROTONIX) 40 MG tablet Take 1 tablet (40 mg total) by mouth daily. 30 tablet 1  ? ?No current facility-administered medications for this visit.  ? ? ? ?Allergies: No Known Allergies ? ? ? ?Physical Exam: ?Blood pressure 140/78, pulse 77, temperature 98.1 ?F (36.7 ?C), temperature source Temporal, resp. rate 18, height _0  (1.778 m), weight 229 lb 3.2 oz (104 kg), SpO2 100 %. ?ECOG: 1 ?General appearance: alert and cooperative appeared without distress. ?Head: Normocephalic, without obvious abnormality ?Oropharynx: No oral thrush or ulcers. ?Eyes: No scleral icterus.  Pupils are equal and round reactive to light. ?Lymph nodes: Cervical, supraclavicular, and axillary nodes normal. ?Heart:regular rate and rhythm, S1, S2 normal.  Bilateral lower extremity edema. ?Lung:chest clear, no wheezing, rales, normal symmetric air  entry ?Abdomin: soft, non-tender, without masses or organomegaly. ?Neurological: No motor, sensory deficits.  Intact deep tendon reflexes. ?Skin: No rashes or lesions.  No ecchymosis or petechiae. ?Musculoskeletal: No joint deformity or effusion. ?Psychiatric: Mood and affect are appropriate. ? ? ? ?Lab Results: ?Lab Results  ?Component Value Date  ? WBC 8.3 01/16/2022  ? HGB 8.3 (L) 01/16/2022  ? HCT 26.9 (L) 01/16/2022  ? MCV 76.0 (L) 01/16/2022  ? PLT 136 (L) 01/16/2022  ? ?  Chemistry   ?   ?Component Value Date/Time  ? NA 133 (L) 01/14/2022 0519  ? K 3.4 (L) 01/14/2022 0519  ? CL 105 01/14/2022 0519  ? CO2 20 (L) 01/14/2022 0519  ? BUN 5 (L) 01/14/2022 0519  ? CREATININE 0.77 01/14/2022 0519  ?    ?Component Value Date/Time  ? CALCIUM 8.2 (L) 01/14/2022 0519  ? ALKPHOS 75 01/16/2022 0338  ? AST 54 (H) 01/16/2022 0338  ? ALT 21 01/16/2022 0338  ? BILITOT 7.8 (H) 01/16/2022 0338  ?  ? ? ? ? ? ? ?Impression and Plan: ? ? ?36 year old with: ? ?1.  Acute hemolysis noted in April 2023 after presenting with a hemoglobin of 3.8.  His work-up revealed mild iron deficiency but no other clear-cut cause for his hemolysis.  His Coombs testing was negative and PNH screen was negative as well. ? ?Laboratory data from today continues to show stability with a hemoglobin around 8 and a platelet count of 130 range.  The differential diagnosis included multifactorial anemia related to nonimmune hemolysis from his cirrhosis versus and intramedullary hemolysis.  Given the stability of his counts a bone marrow process becomes less likely. ? ?From a management  standpoint I recommended continued monitoring and obtaining a bone marrow biopsy if he has a relapse of this episode. ? ?2.  Follow-up: We will be in the next 2 months to repeat laboratory testing. ? ?30  minutes were dedicated to this visit. The time was spent on reviewing laboratory data, imaging studies, discussing treatment options, discussing differential diagnosis and  answering questions regarding future plan. ? ? ? ?Joshua Button, MD ?4/24/202310:30 AM ? ?

## 2022-01-21 NOTE — Telephone Encounter (Signed)
CRITICAL VALUE STICKER ? ?CRITICAL VALUE:     total bilirubin  4.5 ? ?RECEIVER (on-site recipient of call):  Myriam Jacobson ? ?DATE & TIME NOTIFIED:   01/21/2022   10:45 ? ?MESSENGER (representative from lab):  Lauren ? ?MD NOTIFIED: Clelia Croft ? ?TIME OF NOTIFICATION:  10:48 ? ? ?

## 2022-01-25 LAB — FLOW CYTOMETRY

## 2022-01-29 DIAGNOSIS — E8809 Other disorders of plasma-protein metabolism, not elsewhere classified: Secondary | ICD-10-CM | POA: Insufficient documentation

## 2022-03-26 ENCOUNTER — Inpatient Hospital Stay: Payer: Medicaid Other | Admitting: Oncology

## 2022-03-26 ENCOUNTER — Inpatient Hospital Stay: Payer: Medicaid Other | Attending: Oncology

## 2022-03-27 ENCOUNTER — Telehealth: Payer: Self-pay | Admitting: Oncology

## 2022-03-27 NOTE — Telephone Encounter (Signed)
.  Called patient to schedule appointment per 6/28 inbasket, patient is aware of date and time.   

## 2022-04-16 ENCOUNTER — Inpatient Hospital Stay: Payer: Medicaid Other | Admitting: Oncology

## 2022-04-16 ENCOUNTER — Inpatient Hospital Stay: Payer: Medicaid Other | Attending: Oncology

## 2022-04-17 ENCOUNTER — Telehealth: Payer: Self-pay | Admitting: Oncology

## 2022-04-17 NOTE — Telephone Encounter (Signed)
.  Called patient to schedule appointment per 7/18inbasket, patient is aware of date and time.   

## 2022-05-30 ENCOUNTER — Inpatient Hospital Stay: Payer: Medicaid Other | Attending: Oncology

## 2022-05-30 ENCOUNTER — Telehealth: Payer: Self-pay

## 2022-05-30 ENCOUNTER — Inpatient Hospital Stay: Payer: Medicaid Other | Admitting: Oncology

## 2022-05-30 NOTE — Telephone Encounter (Signed)
I have called the pt as he has not presented for his lab appt. I have LVM requesting a call back to reschedule.

## 2022-07-26 ENCOUNTER — Emergency Department (HOSPITAL_BASED_OUTPATIENT_CLINIC_OR_DEPARTMENT_OTHER): Payer: Medicaid Other

## 2022-07-26 ENCOUNTER — Inpatient Hospital Stay (HOSPITAL_COMMUNITY): Payer: Medicaid Other

## 2022-07-26 ENCOUNTER — Other Ambulatory Visit: Payer: Self-pay

## 2022-07-26 ENCOUNTER — Inpatient Hospital Stay (HOSPITAL_BASED_OUTPATIENT_CLINIC_OR_DEPARTMENT_OTHER)
Admission: EM | Admit: 2022-07-26 | Discharge: 2022-07-28 | DRG: 812 | Disposition: A | Discharge status: Home / Self Care | Payer: Medicaid Other | Attending: Student | Admitting: Student

## 2022-07-26 ENCOUNTER — Encounter (HOSPITAL_COMMUNITY): Payer: Self-pay | Admitting: Internal Medicine

## 2022-07-26 ENCOUNTER — Encounter (HOSPITAL_COMMUNITY): Payer: Self-pay

## 2022-07-26 DIAGNOSIS — D649 Anemia, unspecified: Principal | ICD-10-CM | POA: Diagnosis present

## 2022-07-26 DIAGNOSIS — F1020 Alcohol dependence, uncomplicated: Secondary | ICD-10-CM | POA: Diagnosis present

## 2022-07-26 DIAGNOSIS — K219 Gastro-esophageal reflux disease without esophagitis: Secondary | ICD-10-CM | POA: Diagnosis present

## 2022-07-26 DIAGNOSIS — D638 Anemia in other chronic diseases classified elsewhere: Secondary | ICD-10-CM | POA: Diagnosis present

## 2022-07-26 DIAGNOSIS — D689 Coagulation defect, unspecified: Secondary | ICD-10-CM | POA: Diagnosis not present

## 2022-07-26 DIAGNOSIS — S8012XA Contusion of left lower leg, initial encounter: Secondary | ICD-10-CM | POA: Diagnosis present

## 2022-07-26 DIAGNOSIS — I8510 Secondary esophageal varices without bleeding: Secondary | ICD-10-CM | POA: Diagnosis present

## 2022-07-26 DIAGNOSIS — R161 Splenomegaly, not elsewhere classified: Secondary | ICD-10-CM | POA: Diagnosis present

## 2022-07-26 DIAGNOSIS — D732 Chronic congestive splenomegaly: Secondary | ICD-10-CM | POA: Diagnosis present

## 2022-07-26 DIAGNOSIS — I10 Essential (primary) hypertension: Secondary | ICD-10-CM | POA: Diagnosis present

## 2022-07-26 DIAGNOSIS — T148XXA Other injury of unspecified body region, initial encounter: Secondary | ICD-10-CM | POA: Diagnosis not present

## 2022-07-26 DIAGNOSIS — D684 Acquired coagulation factor deficiency: Secondary | ICD-10-CM | POA: Diagnosis present

## 2022-07-26 DIAGNOSIS — K7030 Alcoholic cirrhosis of liver without ascites: Secondary | ICD-10-CM | POA: Diagnosis present

## 2022-07-26 DIAGNOSIS — D509 Iron deficiency anemia, unspecified: Principal | ICD-10-CM | POA: Diagnosis present

## 2022-07-26 DIAGNOSIS — D696 Thrombocytopenia, unspecified: Secondary | ICD-10-CM | POA: Diagnosis present

## 2022-07-26 DIAGNOSIS — K7040 Alcoholic hepatic failure without coma: Secondary | ICD-10-CM | POA: Diagnosis present

## 2022-07-26 DIAGNOSIS — D594 Other nonautoimmune hemolytic anemias: Secondary | ICD-10-CM | POA: Diagnosis present

## 2022-07-26 DIAGNOSIS — E876 Hypokalemia: Secondary | ICD-10-CM | POA: Diagnosis not present

## 2022-07-26 DIAGNOSIS — Z79899 Other long term (current) drug therapy: Secondary | ICD-10-CM

## 2022-07-26 DIAGNOSIS — Z6833 Body mass index (BMI) 33.0-33.9, adult: Secondary | ICD-10-CM

## 2022-07-26 DIAGNOSIS — I864 Gastric varices: Secondary | ICD-10-CM | POA: Diagnosis present

## 2022-07-26 DIAGNOSIS — K766 Portal hypertension: Secondary | ICD-10-CM | POA: Diagnosis present

## 2022-07-26 DIAGNOSIS — E669 Obesity, unspecified: Secondary | ICD-10-CM | POA: Diagnosis present

## 2022-07-26 HISTORY — DX: Portal hypertension: K76.6

## 2022-07-26 HISTORY — DX: Chronic congestive splenomegaly: D73.2

## 2022-07-26 HISTORY — DX: Alcoholic cirrhosis of liver without ascites: K70.30

## 2022-07-26 LAB — LACTATE DEHYDROGENASE: LDH: 249 U/L — ABNORMAL HIGH (ref 98–192)

## 2022-07-26 LAB — RETICULOCYTES
Immature Retic Fract: 24.2 % — ABNORMAL HIGH (ref 2.3–15.9)
RBC.: 2.21 MIL/uL — ABNORMAL LOW (ref 4.22–5.81)
Retic Count, Absolute: 63.6 10*3/uL (ref 19.0–186.0)
Retic Ct Pct: 2.9 % (ref 0.4–3.1)

## 2022-07-26 LAB — OCCULT BLOOD X 1 CARD TO LAB, STOOL: Fecal Occult Bld: NEGATIVE

## 2022-07-26 LAB — BILIRUBIN, DIRECT: Bilirubin, Direct: 2.4 mg/dL — ABNORMAL HIGH (ref 0.0–0.2)

## 2022-07-26 LAB — IRON AND TIBC
Iron: 19 ug/dL — ABNORMAL LOW (ref 45–182)
Saturation Ratios: 6 % — ABNORMAL LOW (ref 17.9–39.5)
TIBC: 330 ug/dL (ref 250–450)
UIBC: 311 ug/dL

## 2022-07-26 LAB — COMPREHENSIVE METABOLIC PANEL
ALT: 18 U/L (ref 0–44)
AST: 72 U/L — ABNORMAL HIGH (ref 15–41)
Albumin: 3 g/dL — ABNORMAL LOW (ref 3.5–5.0)
Alkaline Phosphatase: 75 U/L (ref 38–126)
Anion gap: 9 (ref 5–15)
BUN: 6 mg/dL (ref 6–20)
CO2: 27 mmol/L (ref 22–32)
Calcium: 8.1 mg/dL — ABNORMAL LOW (ref 8.9–10.3)
Chloride: 103 mmol/L (ref 98–111)
Creatinine, Ser: 0.63 mg/dL (ref 0.61–1.24)
GFR, Estimated: >60 mL/min (ref >60)
Glucose, Bld: 112 mg/dL — ABNORMAL HIGH (ref 70–99)
Potassium: 3.5 mmol/L (ref 3.5–5.1)
Sodium: 139 mmol/L (ref 135–145)
Total Bilirubin: 5.4 mg/dL — ABNORMAL HIGH (ref 0.3–1.2)
Total Protein: 8.4 g/dL — ABNORMAL HIGH (ref 6.5–8.1)

## 2022-07-26 LAB — VITAMIN B12: Vitamin B-12: 1261 pg/mL — ABNORMAL HIGH (ref 180–914)

## 2022-07-26 LAB — PREPARE RBC (CROSSMATCH)

## 2022-07-26 LAB — BRAIN NATRIURETIC PEPTIDE: B Natriuretic Peptide: 178.5 pg/mL — ABNORMAL HIGH (ref 0.0–100.0)

## 2022-07-26 LAB — HIV ANTIBODY (ROUTINE TESTING W REFLEX): HIV Screen 4th Generation wRfx: NONREACTIVE

## 2022-07-26 LAB — HEMOGLOBIN AND HEMATOCRIT, BLOOD
HCT: 17.4 % — ABNORMAL LOW (ref 39.0–52.0)
Hemoglobin: 4.9 g/dL — CL (ref 13.0–17.0)

## 2022-07-26 LAB — PROTIME-INR
INR: 2.1 — ABNORMAL HIGH (ref 0.8–1.2)
Prothrombin Time: 23.2 seconds — ABNORMAL HIGH (ref 11.4–15.2)

## 2022-07-26 LAB — FERRITIN: Ferritin: 13 ng/mL — ABNORMAL LOW (ref 24–336)

## 2022-07-26 LAB — FOLATE: Folate: 5.6 ng/mL — ABNORMAL LOW (ref >5.9)

## 2022-07-26 MED ORDER — PANTOPRAZOLE SODIUM 40 MG PO TBEC
40.0000 mg | DELAYED_RELEASE_TABLET | Freq: Every day | ORAL | Status: DC
  Administered 2022-07-26 – 2022-07-28 (×3): 40 mg via ORAL
  Filled 2022-07-26 (×3): qty 1

## 2022-07-26 MED ORDER — FUROSEMIDE 40 MG PO TABS
40.0000 mg | ORAL_TABLET | Freq: Every day | ORAL | Status: DC
  Administered 2022-07-26 – 2022-07-28 (×3): 40 mg via ORAL
  Filled 2022-07-26 (×3): qty 1

## 2022-07-26 MED ORDER — SODIUM CHLORIDE 0.9 % IV SOLN
400.0000 mg | Freq: Once | INTRAVENOUS | Status: AC
  Administered 2022-07-27: 400 mg via INTRAVENOUS
  Filled 2022-07-26: qty 20

## 2022-07-26 MED ORDER — MORPHINE SULFATE (PF) 2 MG/ML IV SOLN: 2.0000 mg | INTRAVENOUS | Status: DC | PRN

## 2022-07-26 MED ORDER — SODIUM CHLORIDE 0.9% IV SOLUTION
Freq: Once | INTRAVENOUS | Status: AC
  Administered 2022-07-26: 10 mL/h via INTRAVENOUS

## 2022-07-26 MED ORDER — SPIRONOLACTONE 25 MG PO TABS
50.0000 mg | ORAL_TABLET | Freq: Every day | ORAL | Status: DC
  Administered 2022-07-27 – 2022-07-28 (×2): 50 mg via ORAL
  Filled 2022-07-26 (×2): qty 2

## 2022-07-26 NOTE — ED Notes (Signed)
CRITICAL VALUE STICKER  CRITICAL VALUE:HGB 4.6  RECEIVER (on-site recipient of call):[name]  DATE & TIME NOTIFIED: 07/26/22   MESSENGER (representative from lab):Otila Kluver  MD NOTIFIED: Billy Fischer  TIME OF NOTIFICATION:1104  RESPONSE:

## 2022-07-26 NOTE — ED Triage Notes (Signed)
Pt to ED c/o left leg injury/swelling. Pt reports he injured his leg yesterday while working in the  yard. Reports hit leg on lawn mower. Significant swelling noted to left lower leg, pt able to move toes and bare weight with discomfort.

## 2022-07-26 NOTE — ED Notes (Signed)
ED Provider at bedside. 

## 2022-07-26 NOTE — ED Notes (Signed)
Pt states that he can bear weight on leg and denis numbness or tingling. Able to palpate pedal pulses

## 2022-07-26 NOTE — H&P (Signed)
History and Physical    Patient: [name] ENI:[id number] DOB: 09/14/1986 DOA: 07/26/2022 DOS: the patient was seen and examined on 07/26/2022 PCP: Malka So., MD  Patient coming from: Home  Chief Complaint:  Chief Complaint  Patient presents with   Leg Injury   Leg Swelling    left   HPI: [name] is a 36 y.o. male with medical history significant of alcoholic cirrhosis, multifactorial anemia, thrombocytopenia, esophageal varices. Presening with left leg swelling. He says he was in his normal state of health until yesterday when his hit his left leg on his lawnmower. It didn't open a wound. Over the evening, his leg began to swell and became painful. He tried to go to work today, but his leg was still painful. So, he came to the ED for evaluation. He denies any hematemesis, hematochezia, hematuria. He denies chest pain, palpitations or dyspnea. He denies any syncopal episodes or fatigue. He reports that he last had alcohol 3 weeks ago. He denies any other aggravating or alleviating factors.    Review of Systems: As mentioned in the history of present illness. All other systems reviewed and are negative. Past Medical History:  Diagnosis Date   Liver cirrhosis, alcoholic (HCC)    Portal hypertension (HCC)    Splenomegaly, congestive, chronic    Past Surgical History:  Procedure Laterality Date   COSMETIC SURGERY     ESOPHAGOGASTRODUODENOSCOPY N/A 04/26/2021   Procedure: ESOPHAGOGASTRODUODENOSCOPY (EGD);  Surgeon: Willis Modena, MD;  Location: [facility];  Service: Endoscopy;  Laterality: N/A;   MANDIBLE FRACTURE SURGERY     Social History:  reports that he has never smoked. He has never used smokeless tobacco. He reports that he does not currently use alcohol. He reports that he does not use drugs.  No Known Allergies  No family history on file.  Prior to Admission medications   Medication Sig Start Date End Date Taking? Authorizing Provider  ferrous sulfate  325 (65 FE) MG tablet Take 325 mg by mouth daily with breakfast.   Yes [provider]  pantoprazole (PROTONIX) 40 MG tablet Take 1 tablet (40 mg total) by mouth daily. Patient not taking: Reported on 07/26/2022 04/28/21 07/26/22  Arsenio Loader, MD    Physical Exam: Vitals:   07/26/22 1215 07/26/22 1315 07/26/22 1434 07/26/22 1445  BP: (!) 140/64 (!) 142/76 134/71 125/74  Pulse: 86 85 81   Resp: 20 18 19    Temp:      TempSrc:      SpO2: 97% 97% 100%   Weight:      Height:       General: 36 y.o. male resting in bed in NAD Eyes: PERRL, icteric sclera ENMT: Nares patent w/o discharge, orophaynx clear, dentition normal, ears w/o discharge/lesions/ulcers Neck: Supple, trachea midline Cardiovascular: RRR, +S1, S2, no g/r, 3/6 holosystolic murmur, equal pulses throughout Respiratory: CTABL, no w/r/r, normal WOB GI: BS+, NDNT, no masses noted, hepatomegaly noted MSK: No c/c; LLE edema Neuro: A&O x 3, no focal deficits Psyc: Appropriate interaction and affect, calm/cooperative  Data Reviewed:  Results for orders placed or performed during the hospital encounter of 07/26/22 (from the past 24 hour(s))  CBC with Differential     Status: Abnormal   Collection Time: 07/26/22 10:26 AM  Result Value Ref Range   WBC 6.0 4.0 - 10.5 K/uL   RBC 2.42 (L) 4.22 - 5.81 MIL/uL   Hemoglobin 4.6 (LL) 13.0 - 17.0 g/dL   HCT 07/28/22 (L) 61.4 - 43.1 %  MCV 68.6 (L) 80.0 - 100.0 fL   MCH 19.0 (L) 26.0 - 34.0 pg   MCHC 27.7 (L) 30.0 - 36.0 g/dL   RDW 30.8 (H) 65.7 - 84.6 %   Platelets 127 (L) 150 - 400 K/uL   nRBC 0.8 (H) 0.0 - 0.2 %   Neutrophils Relative % 59 %   Neutro Abs 3.6 1.7 - 7.7 K/uL   Lymphocytes Relative 20 %   Lymphs Abs 1.2 0.7 - 4.0 K/uL   Monocytes Relative 15 %   Monocytes Absolute 0.9 0.1 - 1.0 K/uL   Eosinophils Relative 3 %   Eosinophils Absolute 0.2 0.0 - 0.5 K/uL   Basophils Relative 2 %   Basophils Absolute 0.1 0.0 - 0.1 K/uL   Immature Granulocytes 1 %    Abs Immature Granulocytes 0.04 0.00 - 0.07 K/uL   Schistocytes PRESENT    Target Cells PRESENT   Comprehensive metabolic panel     Status: Abnormal   Collection Time: 07/26/22 10:26 AM  Result Value Ref Range   Sodium 139 135 - 145 mmol/L   Potassium 3.5 3.5 - 5.1 mmol/L   Chloride 103 98 - 111 mmol/L   CO2 27 22 - 32 mmol/L   Glucose, Bld 112 (H) 70 - 99 mg/dL   BUN 6 6 - 20 mg/dL   Creatinine, Ser 9.62 0.61 - 1.24 mg/dL   Calcium 8.1 (L) 8.9 - 10.3 mg/dL   Total Protein 8.4 (H) 6.5 - 8.1 g/dL   Albumin 3.0 (L) 3.5 - 5.0 g/dL   AST 72 (H) 15 - 41 U/L   ALT 18 0 - 44 U/L   Alkaline Phosphatase 75 38 - 126 U/L   Total Bilirubin 5.4 (H) 0.3 - 1.2 mg/dL   GFR, Estimated >95 >28 mL/min   Anion gap 9 5 - 15  Brain natriuretic peptide     Status: Abnormal   Collection Time: 07/26/22 10:26 AM  Result Value Ref Range   B Natriuretic Peptide 178.5 (H) 0.0 - 100.0 pg/mL  Protime-INR     Status: Abnormal   Collection Time: 07/26/22 10:26 AM  Result Value Ref Range   Prothrombin Time 23.2 (H) 11.4 - 15.2 seconds   INR 2.1 (H) 0.8 - 1.2  Occult blood card to lab, stool     Status: None   Collection Time: 07/26/22 11:35 AM  Result Value Ref Range   Fecal Occult Bld NEGATIVE NEGATIVE  Ferritin     Status: Abnormal   Collection Time: 07/26/22 12:17 PM  Result Value Ref Range   Ferritin 13 (L) 24 - 336 ng/mL  Reticulocytes     Status: Abnormal   Collection Time: 07/26/22 12:17 PM  Result Value Ref Range   Retic Ct Pct 2.9 0.4 - 3.1 %   RBC. 2.21 (L) 4.22 - 5.81 MIL/uL   Retic Count, Absolute 63.6 19.0 - 186.0 K/uL   Immature Retic Fract 24.2 (H) 2.3 - 15.9 %  Lactate dehydrogenase     Status: Abnormal   Collection Time: 07/26/22 12:17 PM  Result Value Ref Range   LDH 249 (H) 98 - 192 U/L  Bilirubin, direct     Status: Abnormal   Collection Time: 07/26/22 12:17 PM  Result Value Ref Range   Bilirubin, Direct 2.4 (H) 0.0 - 0.2 mg/dL   XR Left Ankle Extensive soft tissue  swelling. No evidence of acute left ankle fracture.  Venous US LLE Negative.  XR Left Tib/Fib Negative.  EKG: sinus, no  st elevations  Assessment and Plan: Microcytic anemia Symptomatic anemia     - admit to inpt, progressive     - 2 units pRBCs ordered, trend Hgb      - FOBT negative     - Heme-onc onboard, appreciate assistance  Alcoholic cirrhosis Hx of esophageal varices Coagulopathy Hyperbilirubinemia     - he has not had regular follow up with GI     - MELD score is 21     - ab [place]     - spoke with [facility] (Dr. Michail Sermon); rec's cirrhotic maintenance meds, get the above info and callback if needed in AM  LLE hematoma/pain     - no DVT     - imaging clear of fractures     - pain control, observe  HTN     - add lasix, spironolactone as above; follow  GERD     - protonix  Advance Care Planning:   Code Status: FULL  Consults: Oncology (Dr. Alvy Bimler), GI  Family Communication: None at bedside  Severity of Illness: The appropriate patient status for this patient is INPATIENT. Inpatient status is judged to be reasonable and necessary in order to provide the required intensity of service to ensure the patient's safety. The patient's presenting symptoms, physical exam findings, and initial radiographic and laboratory data in the context of their chronic comorbidities is felt to place them at high risk for further clinical deterioration. Furthermore, it is not anticipated that the patient will be medically stable for discharge from the hospital within 2 midnights of admission.   * I certify that at the point of admission it is my clinical judgment that the patient will require inpatient hospital care spanning beyond 2 midnights from the point of admission due to high intensity of service, high risk for further deterioration and high frequency of surveillance required.*  Author: Jonnie Finner, DO 07/26/2022 3:22 PM  For on call review [email or web address].

## 2022-07-26 NOTE — ED Notes (Signed)
Patient transported to Ultrasound 

## 2022-07-26 NOTE — ED Notes (Signed)
Report given to [facility] 

## 2022-07-26 NOTE — ED Provider Notes (Signed)
[facility] EMERGENCY DEPT Provider Note   CSN: [id number] Arrival date & time: 07/26/22  0900     History {Add pertinent medical, surgical, social history, OB history to HPI:1} Chief Complaint  Patient presents with  . Leg Injury  . Leg Swelling    left    [name] is a 36 y.o. male.  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  Reports incident happened at 5PM last night, just sort of ran into it and it it with his leg. Can swtill walk on it, not significant pain. Swelling this AM when he woke was severe. Denies having prior right greater than left swelling. Denies fevers, cp, dyspnea, nausea, vomiting.   No past medical history on file.   Home Medications Prior to Admission medications   Medication Sig Start Date End Date Taking? Authorizing Provider  pantoprazole (PROTONIX) 40 MG tablet Take 1 tablet (40 mg total) by mouth daily. 04/28/21 04/28/22  Elvin So, MD      Allergies    Patient has no known allergies.    Review of Systems   Review of Systems  Physical Exam Updated Vital Signs BP (!) 141/66 (BP Location: Right Arm)   Pulse 93   Temp 98.7 F (37.1 C)   Resp 18   Ht 5\' 10"  (1.778 m)   Wt 106.6 kg   SpO2 100%   BMI 33.72 kg/m  Physical Exam  ED Results / Procedures / Treatments   Labs (all labs ordered are listed, but only abnormal results are displayed) Labs Reviewed - No data to display  EKG None  Radiology DG Ankle Left Port  Result Date: 07/26/2022 CLINICAL DATA:  Injury EXAM: PORTABLE LEFT ANKLE - 2 VIEW COMPARISON:  None Available. FINDINGS: There is extensive soft tissue swelling of the left ankle and distal lower leg. There is no evidence of acute  fracture. Alignment is normal. IMPRESSION: Extensive soft tissue swelling. No evidence of acute left ankle fracture. Electronically Signed   By: Maurine Simmering M.D.   On: 07/26/2022 09:38    Procedures Procedures  {Document cardiac monitor, telemetry assessment procedure when appropriate:1}  Medications Ordered in ED Medications - No data to display  ED Course/ Medical Decision Making/ A&P                           Medical Decision Making Amount and/or Complexity of Data Reviewed Labs: ordered. Radiology: ordered.  Risk Decision regarding hospitalization.   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   No melena on rectal exam or history of bleeding, abdominal pain, flank pain.   {Document critical care time when appropriate:1} {Document review of labs and clinical decision tools ie heart score, Chads2Vasc2 etc:1}  {Document your independent review of radiology images, and any outside records:1} {Document your discussion with family members, caretakers, and with consultants:1} {Document social determinants of health affecting pt's care:1} {Document your decision making why or why not admission, treatments were needed:1} Final Clinical Impression(s) / ED Diagnoses Final diagnoses:  None    Rx / DC Orders ED Discharge Orders     None

## 2022-07-26 NOTE — ED Provider Notes (Signed)
Pt transferred from [place] for significant anemia (hgb 4.6) for admission.  Pt has been seen by Dr. Alvy Bimler (oncology).  She put in a consult for GI as well as transfusion orders.  Dr. Marylyn Ishihara aware pt is now here.   Isla Pence, MD 07/26/22 1521

## 2022-07-26 NOTE — Consult Note (Signed)
[facility] CONSULT NOTE  Patient Care Team: Lilian Coma., MD as PCP - General (Internal Medicine)  ASSESSMENT & PLAN:  Iron deficiency anemia on background history of GI bleed and esophageal varices  I recommend GI consult We discussed some of the risks, benefits, and alternatives of blood transfusions. The patient is symptomatic from anemia and the hemoglobin level is critically low.  Some of the side-effects to be expected including risks of transfusion reactions, chills, infection, syndrome of volume overload and risk of hospitalization from various reasons and the patient is willing to proceed and went ahead to sign consent today.  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 The patient knows to call the clinic with any problems, questions or concerns.  The total time spent in the appointment was 80 minutes encounter with patients including review of chart and various tests results, discussions about plan of care and coordination of care plan  Heath Lark, MD 10/27/20233:22 PM   CHIEF COMPLAINTS/PURPOSE OF CONSULTATION:  Anemia, on background history of ineffective erythropoiesis/hemolysis and liver failure  HISTORY OF PRESENTING ILLNESS:  Felder Criscione  36 y.o. male is seen at the request by emergency room physician due to presentation with severe anemia I have reviewed his medical history; the patient had history of severe anemia several months ago and approximately a year ago, was found to have portal hypertension with esophageal varices as well as erosion.  CT imaging from April of this year showed signs of liver cirrhosis and splenomegaly He was actually seen in 2021 in hepatology clinic He did not recall that appointment He was seen by Dr. Alen Blew due to signs of hemolysis, thought to be related to ineffective erythropoiesis He was then lost to follow-up He presented to the emergency department due to recent injury to his left leg causing significant soft tissue swelling He went to work but was told by his employer to seek medical attention due to his inability to walk without limping CBC from today show hemoglobin of 4.6, MCV of 68.6 and platelet count 127,000 Repeat iron studies confirm iron deficiency Direct and indirect bilirubin levels were high He was directed to [facility] emergency department to be admitted under the hospitalist service for management  He denies recent chest pain on exertion, shortness of breath on minimal exertion, pre-syncopal episodes, or palpitations. He had not noticed any recent bleeding such as epistaxis, hematuria or hematochezia  He had no prior history or diagnosis of cancer. His age appropriate screening programs are up-to-date. He denies any pica and eats a variety of diet. His last alcohol intake was approximately 3 weeks ago  MEDICAL HISTORY:  Past Medical History:  Diagnosis Date   Liver cirrhosis, alcoholic ([place])    Portal hypertension (HCC)    Splenomegaly, congestive, chronic     SURGICAL HISTORY: Past Surgical History:  Procedure  Laterality Date   COSMETIC SURGERY     ESOPHAGOGASTRODUODENOSCOPY N/A 04/26/2021   Procedure: ESOPHAGOGASTRODUODENOSCOPY (EGD);  Surgeon: Arta Silence,  MD;  Location: [facility];  Service: Endoscopy;  Laterality: N/A;   MANDIBLE FRACTURE SURGERY      SOCIAL HISTORY: 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    Drug use: No   Sexual activity: Not on file  Other Topics Concern   Not on file  Social History Narrative   Not on file   Social Determinants of Health   Financial Resource Strain: Not on file  Food Insecurity: No Food Insecurity (07/26/2022)   Hunger Vital Sign    Worried About Running Out of Food in the Last Year: Never true    Ran Out of Food in the Last Year: Never true  Transportation Needs: No Transportation Needs (07/26/2022)   PRAPARE - Hydrologist (Medical): No    Lack of Transportation (Non-Medical): No  Physical Activity: Not on file  Stress: Not on file  Social Connections: Not on file  Intimate Partner Violence: Not At Risk (07/26/2022)   Humiliation, Afraid, Rape, and Kick questionnaire    Fear of Current or Ex-Partner: No    Emotionally Abused: No    Physically Abused: No    Sexually Abused: No    FAMILY HISTORY: No family history on file.  ALLERGIES:  has No Known Allergies.  MEDICATIONS:  Current Facility-Administered Medications  Medication Dose Route Frequency Provider Last Rate Last Admin   0.9 %  sodium chloride infusion (Manually program via Guardrails IV Fluids)   Intravenous Once Heath Lark, MD       [START ON 07/27/2022] iron sucrose (VENOFER) 400 mg in sodium chloride 0.9 % 250 mL IVPB  400 mg Intravenous Once Heath Lark, MD       Current Outpatient  Medications  Medication Sig Dispense Refill   ferrous sulfate 325 (65 FE) MG tablet Take 325 mg by mouth daily with breakfast.     pantoprazole (PROTONIX) 40 MG tablet Take 1 tablet (40 mg total) by mouth daily. (Patient not taking: Reported on 07/26/2022) 30 tablet 1    REVIEW OF SYSTEMS:   Constitutional: Denies fevers, chills or abnormal night sweats Eyes: Denies blurriness of vision, double vision or watery eyes Ears, nose, mouth, throat, and face: Denies mucositis or sore throat Respiratory: Denies cough, dyspnea or wheezes Cardiovascular: Denies palpitation, chest discomfort or lower extremity swelling Gastrointestinal:  Denies nausea, heartburn or change in bowel habits Skin: Denies abnormal skin rashes Lymphatics: Denies new lymphadenopathy or easy bruising Neurological:Denies numbness, tingling or new weaknesses Behavioral/Psych: Mood is stable, no new changes  All other systems were reviewed with the patient and are negative.  PHYSICAL EXAMINATION: ECOG PERFORMANCE STATUS: 1 - Symptomatic but completely ambulatory  Vitals:   07/26/22 1434 07/26/22 1445  BP: 134/71 125/74  Pulse: 81   Resp: 19   Temp:    SpO2: 100%    Filed Weights   07/26/22  0909  Weight: 235 lb (106.6 kg)    GENERAL:alert, no distress and comfortable SKIN: skin color, texture, turgor are normal, no rashes or significant lesions EYES: Noted scleral icterus consistent with symptomatic jaundice OROPHARYNX:no exudate, no erythema and lips, buccal mucosa, and tongue normal  NECK: supple, thyroid normal size, non-tender, without nodularity LYMPH:  no palpable lymphadenopathy in the cervical, axillary or inguinal LUNGS: clear to auscultation and percussion with normal breathing effort HEART: regular rate & rhythm and no murmurs and no lower extremity edema ABDOMEN:abdomen soft, non-tender and normal bowel sounds Musculoskeletal:no cyanosis of digits and no clubbing.  Noted significant soft tissue  hematoma on the left lower extremity PSYCH: alert & oriented x 3 with fluent speech NEURO: no focal motor/sensory deficits  RADIOGRAPHIC STUDIES: I have reviewed his prior CT imagingI have personally reviewed the radiological images as listed and agreed with the findings in the report. US Venous Img Lower Unilateral Left  Result Date: 07/26/2022 CLINICAL DATA:  Pain and swelling post injury EXAM: LEFT LOWER EXTREMITY VENOUS DOPPLER ULTRASOUND TECHNIQUE: Gray-scale sonography with compression, as well as color and duplex ultrasound, were performed to evaluate the deep venous system(s) from the level of the common femoral vein through the popliteal and proximal calf veins. COMPARISON:  None Available. FINDINGS: VENOUS Normal compressibility of the common femoral, superficial femoral, and popliteal veins, as well as the visualized calf veins. Visualized portions of profunda femoral vein and great saphenous vein unremarkable. No filling defects to suggest DVT on grayscale or color Doppler imaging. Doppler waveforms show normal direction of venous flow, normal respiratory plasticity and response to augmentation. Limited views of the contralateral common femoral vein are unremarkable. OTHER None. Limitations: none IMPRESSION: Negative. Electronically Signed   By: Lucrezia Europe M.D.   On: 07/26/2022 10:21   DG Tibia/Fibula Left  Result Date: 07/26/2022 CLINICAL DATA:  Acute left leg pain and swelling after injury yesterday. EXAM: LEFT TIBIA AND FIBULA - 2 VIEW COMPARISON:  None Available. FINDINGS: There is no evidence of fracture or other focal bone lesions. Soft tissues are unremarkable. IMPRESSION: Negative. Electronically Signed   By: Marijo Conception M.D.   On: 07/26/2022 10:10   DG Ankle Left Port  Result Date: 07/26/2022 CLINICAL DATA:  Injury EXAM: PORTABLE LEFT ANKLE - 2 VIEW COMPARISON:  None Available. FINDINGS: There is extensive soft tissue swelling of the left ankle and distal lower leg. There  is no evidence of acute fracture. Alignment is normal. IMPRESSION: Extensive soft tissue swelling. No evidence of acute left ankle fracture. Electronically Signed   By: Maurine Simmering M.D.   On: 07/26/2022 09:38

## 2022-07-26 NOTE — Progress Notes (Signed)
Plan of Care Note for accepted transfer   Patient: [name] MRN: [id number]   DOA: 07/26/2022  Facility requesting transfer: [facility] Requesting Provider: Dr. Billy Fischer Reason for transfer: Symptomatic anemia Facility course: 36 yo M w/ PMHx of alcoholic cirrhosis, multifactorial anemia, thrombocytopenia, esophageal varices. Presenting w/ left leg swelling after hitting his leg on a lawnmower. Denies hematemesis or hematochezia. W/u revealed negative DVT. His Hgb was 4.6 (baseline 8.3). He does have a hematoma and erythema of the leg. But EDP says no concern for compartment syndrome. Vitals are stable. EDP. Transferring ED-to-ED for transfusion. Will need hematology consult as he never had complete work for his anemia.   Plan of care: The patient is accepted for admission to Progressive unit, at [facility]..  While holding at [facility], medical decision making responsibilities remain with the EDP. Upon arrival to [facility], [facility] will assume care. Thank you.   Author: Jonnie Finner, DO 07/26/2022  Check [email or web address] for on-call coverage.  Nursing staff, Please call [place] number on Amion as soon as patient's arrival, so appropriate admitting provider can evaluate the pt.

## 2022-07-27 DIAGNOSIS — I10 Essential (primary) hypertension: Secondary | ICD-10-CM

## 2022-07-27 DIAGNOSIS — D649 Anemia, unspecified: Secondary | ICD-10-CM | POA: Diagnosis not present

## 2022-07-27 DIAGNOSIS — D696 Thrombocytopenia, unspecified: Secondary | ICD-10-CM

## 2022-07-27 DIAGNOSIS — D638 Anemia in other chronic diseases classified elsewhere: Secondary | ICD-10-CM

## 2022-07-27 DIAGNOSIS — K7030 Alcoholic cirrhosis of liver without ascites: Secondary | ICD-10-CM

## 2022-07-27 DIAGNOSIS — K219 Gastro-esophageal reflux disease without esophagitis: Secondary | ICD-10-CM

## 2022-07-27 DIAGNOSIS — D509 Iron deficiency anemia, unspecified: Secondary | ICD-10-CM

## 2022-07-27 DIAGNOSIS — T148XXA Other injury of unspecified body region, initial encounter: Secondary | ICD-10-CM

## 2022-07-27 DIAGNOSIS — D689 Coagulation defect, unspecified: Secondary | ICD-10-CM

## 2022-07-27 LAB — CBC WITH DIFFERENTIAL/PLATELET
Abs Immature Granulocytes: 0.02 K/uL (ref 0.00–0.07)
Basophils Absolute: 0.1 K/uL (ref 0.0–0.1)
Basophils Relative: 2 %
Eosinophils Absolute: 0.2 K/uL (ref 0.0–0.5)
Eosinophils Relative: 4 %
HCT: 19.8 % — ABNORMAL LOW (ref 39.0–52.0)
Hemoglobin: 5.9 g/dL — CL (ref 13.0–17.0)
Immature Granulocytes: 0 %
Lymphocytes Relative: 22 %
Lymphs Abs: 1.1 K/uL (ref 0.7–4.0)
MCH: 21.1 pg — ABNORMAL LOW (ref 26.0–34.0)
MCHC: 29.8 g/dL — ABNORMAL LOW (ref 30.0–36.0)
MCV: 71 fL — ABNORMAL LOW (ref 80.0–100.0)
Monocytes Absolute: 0.9 K/uL (ref 0.1–1.0)
Monocytes Relative: 18 %
Neutro Abs: 2.6 K/uL (ref 1.7–7.7)
Neutrophils Relative %: 54 %
Platelets: 120 K/uL — ABNORMAL LOW (ref 150–400)
RBC: 2.79 MIL/uL — ABNORMAL LOW (ref 4.22–5.81)
RDW: 26.3 % — ABNORMAL HIGH (ref 11.5–15.5)
WBC: 4.8 K/uL (ref 4.0–10.5)
nRBC: 1.5 % — ABNORMAL HIGH (ref 0.0–0.2)

## 2022-07-27 LAB — COMPREHENSIVE METABOLIC PANEL
ALT: 20 U/L (ref 0–44)
AST: 65 U/L — ABNORMAL HIGH (ref 15–41)
Albumin: 2.6 g/dL — ABNORMAL LOW (ref 3.5–5.0)
Alkaline Phosphatase: 86 U/L (ref 38–126)
Anion gap: 6 (ref 5–15)
BUN: 7 mg/dL (ref 6–20)
CO2: 26 mmol/L (ref 22–32)
Calcium: 7.6 mg/dL — ABNORMAL LOW (ref 8.9–10.3)
Chloride: 103 mmol/L (ref 98–111)
Creatinine, Ser: 0.62 mg/dL (ref 0.61–1.24)
GFR, Estimated: >60 mL/min (ref >60)
Glucose, Bld: 95 mg/dL (ref 70–99)
Potassium: 3 mmol/L — ABNORMAL LOW (ref 3.5–5.1)
Sodium: 135 mmol/L (ref 135–145)
Total Bilirubin: 6.9 mg/dL — ABNORMAL HIGH (ref 0.3–1.2)
Total Protein: 8.4 g/dL — ABNORMAL HIGH (ref 6.5–8.1)

## 2022-07-27 LAB — FIBRINOGEN
Fibrinogen: 212 mg/dL (ref 210–475)
Fibrinogen: 217 mg/dL (ref 210–475)

## 2022-07-27 LAB — CBC
HCT: 22.3 % — ABNORMAL LOW (ref 39.0–52.0)
HCT: 22.7 % — ABNORMAL LOW (ref 39.0–52.0)
Hemoglobin: 7 g/dL — ABNORMAL LOW (ref 13.0–17.0)
Hemoglobin: 7 g/dL — ABNORMAL LOW (ref 13.0–17.0)
MCH: 22.4 pg — ABNORMAL LOW (ref 26.0–34.0)
MCH: 22.2 pg — ABNORMAL LOW (ref 26.0–34.0)
MCHC: 31.4 g/dL (ref 30.0–36.0)
MCHC: 30.8 g/dL (ref 30.0–36.0)
MCV: 71.5 fL — ABNORMAL LOW (ref 80.0–100.0)
MCV: 71.8 fL — ABNORMAL LOW (ref 80.0–100.0)
Platelets: 115 10*3/uL — ABNORMAL LOW (ref 150–400)
Platelets: 118 10*3/uL — ABNORMAL LOW (ref 150–400)
RBC: 3.12 MIL/uL — ABNORMAL LOW (ref 4.22–5.81)
RBC: 3.16 MIL/uL — ABNORMAL LOW (ref 4.22–5.81)
RDW: 25.4 % — ABNORMAL HIGH (ref 11.5–15.5)
RDW: 25.3 % — ABNORMAL HIGH (ref 11.5–15.5)
WBC: 4.7 10*3/uL (ref 4.0–10.5)
WBC: 5.2 10*3/uL (ref 4.0–10.5)
nRBC: 2.8 % — ABNORMAL HIGH (ref 0.0–0.2)
nRBC: 2.9 % — ABNORMAL HIGH (ref 0.0–0.2)

## 2022-07-27 LAB — PROTIME-INR
INR: 2.1 — ABNORMAL HIGH (ref 0.8–1.2)
Prothrombin Time: 23.1 seconds — ABNORMAL HIGH (ref 11.4–15.2)

## 2022-07-27 LAB — HEPATITIS PANEL, ACUTE
HCV Ab: NONREACTIVE
Hep A IgM: NONREACTIVE
Hep B C IgM: NONREACTIVE
Hepatitis B Surface Ag: NONREACTIVE

## 2022-07-27 LAB — PREPARE RBC (CROSSMATCH)

## 2022-07-27 LAB — MAGNESIUM: Magnesium: 1.9 mg/dL (ref 1.7–2.4)

## 2022-07-27 LAB — CK: Total CK: 189 U/L (ref 49–397)

## 2022-07-27 LAB — APTT: aPTT: 57 seconds — ABNORMAL HIGH (ref 24–36)

## 2022-07-27 MED ORDER — SODIUM CHLORIDE 0.9% IV SOLUTION: Freq: Once | INTRAVENOUS | Status: AC

## 2022-07-27 MED ORDER — POTASSIUM CHLORIDE CRYS ER 20 MEQ PO TBCR
40.0000 meq | EXTENDED_RELEASE_TABLET | ORAL | Status: AC
  Administered 2022-07-27 (×2): 40 meq via ORAL
  Filled 2022-07-27 (×2): qty 2

## 2022-07-27 NOTE — Plan of Care (Signed)
  Problem: Activity: Goal: Risk for activity intolerance will decrease Outcome: Progressing   Problem: Clinical Measurements: Goal: Diagnostic test results will improve Outcome: Progressing   Problem: Nutrition: Goal: Adequate nutrition will be maintained Outcome: Progressing   Problem: Elimination: Goal: Will not experience complications related to bowel motility Outcome: Progressing

## 2022-07-27 NOTE — Progress Notes (Signed)
[name]   DOB:April 17, 1986   ST#:[id number]    ASSESSMENT & PLAN:  Expand All Collapse All  [facility] CONSULT NOTE   Patient Care Team: Malka So., MD as PCP - General (Internal Medicine)   ASSESSMENT & PLAN:  Iron deficiency anemia on background history of GI bleed and esophageal varices  I recommend GI consult (seen [facility] in the past) Despite 2 units of blood, he remains anemic He received more blood today along with IV iron Recommend further blood transfusion to keep hemoglobin greater than 7   ineffective erythropoiesis/hemolysis In the setting of liver cirrhosis and severe iron deficiency Transfusion and IV iron as above   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 The patient knows to call the clinic with any problems, questions or concerns.      The total time spent in the appointment was 30 minutes encounter with patients including review of chart and various tests results, discussions about plan of care and coordination of care plan  Artis Delay, MD 07/27/2022 10:44 AM  Subjective:  He is seen this morning.  He received further blood transfusion.  He is receiving IV iron.  He denies occult bleeding.  His leg swelling is stable and the pain on the soft tissue contusion area is better-  Objective:  Vitals:   07/27/22 0935 07/27/22 1015  BP: (!) 147/70 (!) 147/71  Pulse: 84 77  Resp: 20 18  Temp: 99.5 F (37.5 C) 98.9 F  (37.2 C)  SpO2: 100% 100%     Intake/Output Summary (Last 24 hours) at 07/27/2022 1044 Last data filed at 07/27/2022 0930 Gross per 24 hour  Intake 1962.33 ml  Output --  Net 1962.33 ml    GENERAL:alert, no distress and comfortable HEART: Noted moderate left lower extremity edema NEURO: alert & oriented x 3 with fluent speech, no focal motor/sensory deficits   Labs:  Recent Labs    01/14/22 0519 01/15/22 0302 01/16/22 0338 01/21/22 1000 07/26/22 1026 07/26/22 1217 07/27/22 0427  NA 133*  --   --  139 139  --  135  K 3.4*  --   --  3.8 3.5  --  3.0*  CL 105  --   --  108 103  --  103  CO2 20*  --   --  28 27  --  26  GLUCOSE 87  --   --  86 112*  --  95  BUN 5*  --   --  9 6  --  7  CREATININE 0.77  --   --  0.75 0.63  --  0.62  CALCIUM 8.2*  --   --  8.0* 8.1*  --  7.6*  GFRNONAA >60  --   --  >60 >60  --  >60  PROT 8.2* 8.4* 7.9 8.3* 8.4*  --  8.4*  ALBUMIN 2.3* 2.3* 2.2* 2.7* 3.0*  --  2.6*  AST 62* 54* 54* 66* 72*  --  65*  ALT  21 20 21 22 18   --  20  ALKPHOS 70 72 75 85 75  --  86  BILITOT 8.7* 8.1* 7.8* 4.5* 5.4*  --  6.9*  BILIDIR 3.3* 3.3* 3.5*  --   --  2.4*  --   IBILI 5.4* 4.8* 4.3*  --   --   --   --     Studies:  US Abdomen Limited RUQ (LIVER/GB)  Result Date: 07/26/2022 CLINICAL DATA:  Elevated LFTs. EXAM: ULTRASOUND ABDOMEN LIMITED RIGHT UPPER QUADRANT COMPARISON:  04/25/2021 FINDINGS: Gallbladder: Gallbladder is distended with what appears to be irregular gallbladder wall thickening up to 3-4 mm. No sonographic Percell Miller sign reported by the sonographer. No evidence for gallstones. No pericholecystic fluid. Common bile duct: Diameter: 3 mm Liver: Diffusely increased echogenicity of liver parenchyma suggests fatty deposition. Nodular contour raises the question of cirrhosis. Portal vein is patent on color Doppler imaging with normal direction of blood flow towards the liver. Other: None. IMPRESSION: 1. Diffusely increased echogenicity of the liver  parenchyma suggests fatty deposition. Nodular contour raises the question of cirrhosis. 2. Gallbladder wall thickening without evidence for gallstones. No sonographic Murphy sign. Findings may be related to systemic disease. If there is clinical concern for acute cholecystitis, nuclear scintigraphy may prove helpful to assess for cystic duct obstruction. Electronically Signed   By: Misty Stanley M.D.   On: 07/26/2022 17:57   US Venous Img Lower Unilateral Left  Result Date: 07/26/2022 CLINICAL DATA:  Pain and swelling post injury EXAM: LEFT LOWER EXTREMITY VENOUS DOPPLER ULTRASOUND TECHNIQUE: Gray-scale sonography with compression, as well as color and duplex ultrasound, were performed to evaluate the deep venous system(s) from the level of the common femoral vein through the popliteal and proximal calf veins. COMPARISON:  None Available. FINDINGS: VENOUS Normal compressibility of the common femoral, superficial femoral, and popliteal veins, as well as the visualized calf veins. Visualized portions of profunda femoral vein and great saphenous vein unremarkable. No filling defects to suggest DVT on grayscale or color Doppler imaging. Doppler waveforms show normal direction of venous flow, normal respiratory plasticity and response to augmentation. Limited views of the contralateral common femoral vein are unremarkable. OTHER None. Limitations: none IMPRESSION: Negative. Electronically Signed   By: Lucrezia Europe M.D.   On: 07/26/2022 10:21   DG Tibia/Fibula Left  Result Date: 07/26/2022 CLINICAL DATA:  Acute left leg pain and swelling after injury yesterday. EXAM: LEFT TIBIA AND FIBULA - 2 VIEW COMPARISON:  None Available. FINDINGS: There is no evidence of fracture or other focal bone lesions. Soft tissues are unremarkable. IMPRESSION: Negative. Electronically Signed   By: Marijo Conception M.D.   On: 07/26/2022 10:10   DG Ankle Left Port  Result Date: 07/26/2022 CLINICAL DATA:  Injury EXAM: PORTABLE LEFT  ANKLE - 2 VIEW COMPARISON:  None Available. FINDINGS: There is extensive soft tissue swelling of the left ankle and distal lower leg. There is no evidence of acute fracture. Alignment is normal. IMPRESSION: Extensive soft tissue swelling. No evidence of acute left ankle fracture. Electronically Signed   By: Maurine Simmering M.D.   On: 07/26/2022 09:38

## 2022-07-27 NOTE — Consult Note (Signed)
Referring Provider: Dr. Alanda Slim Primary Care Physician:  Malka So., MD Primary Gastroenterologist:  Gentry Fitz  Reason for Consultation:  Anemia; Cirrhosis  HPI: [name] is a 36 y.o. male with history of alcoholic cirrhosis and anemia admitted for left lower leg swelling following trauma from a lawnmower. Hgb 4.6 (8.1 6 months ago). Denies black stools or hematochezia. Denies abdominal pain. Hem NEG. History of alcohol abuse but has cut down and last used alcohol 3 weeks ago. 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 Gastric varices also noted. Family in the room.  Past Medical History:  Diagnosis Date   Liver cirrhosis, alcoholic (HCC)    Portal hypertension (HCC)    Splenomegaly, congestive, chronic     Past Surgical History:  Procedure Laterality Date   COSMETIC SURGERY     ESOPHAGOGASTRODUODENOSCOPY N/A 04/26/2021   Procedure: ESOPHAGOGASTRODUODENOSCOPY (EGD);  Surgeon: Willis Modena, MD;  Location: [facility];  Service: Endoscopy;  Laterality: N/A;   MANDIBLE FRACTURE SURGERY      Prior to Admission medications   Medication Sig Start Date End Date Taking? Authorizing Provider  ferrous sulfate 325 (65 FE) MG tablet Take 325 mg by mouth daily with breakfast.   Yes [provider]  pantoprazole (PROTONIX) 40 MG tablet Take 1 tablet (40 mg total) by mouth daily. Patient not taking: Reported on 07/26/2022 04/28/21 07/26/22  Arsenio Loader, MD    Scheduled Meds:  sodium chloride   Intravenous Once   furosemide  40 mg Oral Daily   pantoprazole  40 mg Oral Daily   spironolactone  50 mg Oral Daily   Continuous Infusions: PRN Meds:.morphine injection  Allergies as of 07/26/2022   (No Known Allergies)    No family history on file.  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    Drug use: No   Sexual activity: Not on file  Other Topics Concern   Not on file  Social History Narrative   Not on file   Social Determinants of Health   Financial Resource Strain: Not on file  Food Insecurity: No Food Insecurity (07/26/2022)   Hunger Vital Sign    Worried About Running Out of Food in the Last Year: Never true    Ran Out of Food in the Last Year: Never true  Transportation Needs: No Transportation Needs (07/26/2022)   PRAPARE - Administrator, Civil Service (Medical): No    Lack of Transportation (Non-Medical): No  Physical Activity: Not on file  Stress: Not on file  Social Connections: Not on file  Intimate Partner Violence: Not At Risk (07/26/2022)   Humiliation, Afraid, Rape, and Kick questionnaire    Fear of Current or Ex-Partner: No    Emotionally Abused: No    Physically Abused: No    Sexually Abused: No    Review of Systems: All negative except as stated above in HPI.  Physical Exam: Vital signs: Vitals:   07/27/22 1015 07/27/22 1225  BP: (!) 147/71 Marland Kitchen)  148/72  Pulse: 77 77  Resp: 18 17  Temp: 98.9 F (37.2 C) 98.5 F (36.9 C)  SpO2: 100% 100%   Last BM Date : 07/27/22 General:   lethargic, chronically ill-appearing, Well-developed, well-nourished, pleasant and cooperative in NAD Head: normocephalic, atraumatic Eyes: +icteric sclera ENT: oropharynx clear Neck: supple, nontender Lungs:  Clear throughout to auscultation.   No wheezes, crackles, or rhonchi. No acute distress. Heart:  Regular rate and rhythm; no murmurs, clicks, rubs,  or gallops. Abdomen: soft, nontender, nondistended, +BS  Rectal:  Deferred Ext: no edema  GI:  Lab Results: Recent Labs    07/27/22 0427  07/27/22 1241 07/27/22 1627  WBC 4.8 4.7 5.2  HGB 5.9* 7.0* 7.0*  HCT 19.8* 22.3* 22.7*  PLT 120* 115* 118*   BMET Recent Labs    07/26/22 1026 07/27/22 0427  NA 139 135  K 3.5 3.0*  CL 103 103  CO2 27 26  GLUCOSE 112* 95  BUN 6 7  CREATININE 0.63 0.62  CALCIUM 8.1* 7.6*   LFT Recent Labs    07/26/22 1217 07/27/22 0427  PROT  --  8.4*  ALBUMIN  --  2.6*  AST  --  65*  ALT  --  20  ALKPHOS  --  86  BILITOT  --  6.9*  BILIDIR 2.4*  --    PT/INR Recent Labs    07/26/22 1026 07/27/22 1241  LABPROT 23.2* 23.1*  INR 2.1* 2.1*     Studies/Results: US Abdomen Limited RUQ (LIVER/GB)  Result Date: 07/26/2022 CLINICAL DATA:  Elevated LFTs. EXAM: ULTRASOUND ABDOMEN LIMITED RIGHT UPPER QUADRANT COMPARISON:  04/25/2021 FINDINGS: Gallbladder: Gallbladder is distended with what appears to be irregular gallbladder wall thickening up to 3-4 mm. No sonographic Percell Miller sign reported by the sonographer. No evidence for gallstones. No pericholecystic fluid. Common bile duct: Diameter: 3 mm Liver: Diffusely increased echogenicity of liver parenchyma suggests fatty deposition. Nodular contour raises the question of cirrhosis. Portal vein is patent on color Doppler imaging with normal direction of blood flow towards the liver. Other: None. IMPRESSION: 1. Diffusely increased echogenicity of the liver parenchyma suggests fatty deposition. Nodular contour raises the question of cirrhosis. 2. Gallbladder wall thickening without evidence for gallstones. No sonographic Murphy sign. Findings may be related to systemic disease. If there is clinical concern for acute cholecystitis, nuclear scintigraphy may prove helpful to assess for cystic duct obstruction. Electronically Signed   By: Misty Stanley M.D.   On: 07/26/2022 17:57   US Venous Img Lower Unilateral Left  Result Date: 07/26/2022 CLINICAL DATA:  Pain and swelling post injury EXAM: LEFT LOWER EXTREMITY VENOUS DOPPLER ULTRASOUND TECHNIQUE:  Gray-scale sonography with compression, as well as color and duplex ultrasound, were performed to evaluate the deep venous system(s) from the level of the common femoral vein through the popliteal and proximal calf veins. COMPARISON:  None Available. FINDINGS: VENOUS Normal compressibility of the common femoral, superficial femoral, and popliteal veins, as well as the visualized calf veins. Visualized portions of profunda femoral vein and great saphenous vein unremarkable. No filling defects to suggest DVT on grayscale or color Doppler imaging. Doppler waveforms show normal direction of venous flow, normal respiratory plasticity and response to augmentation. Limited views of the contralateral common femoral vein are unremarkable. OTHER None. Limitations: none IMPRESSION: Negative. Electronically Signed   By: Lucrezia Europe M.D.   On: 07/26/2022 10:21   DG Tibia/Fibula Left  Result Date: 07/26/2022 CLINICAL DATA:  Acute left leg pain and swelling after  injury yesterday. EXAM: LEFT TIBIA AND FIBULA - 2 VIEW COMPARISON:  None Available. FINDINGS: There is no evidence of fracture or other focal bone lesions. Soft tissues are unremarkable. IMPRESSION: Negative. Electronically Signed   By: Lupita Raider M.D.   On: 07/26/2022 10:10   DG Ankle Left Port  Result Date: 07/26/2022 CLINICAL DATA:  Injury EXAM: PORTABLE LEFT ANKLE - 2 VIEW COMPARISON:  None Available. FINDINGS: There is extensive soft tissue swelling of the left ankle and distal lower leg. There is no evidence of acute fracture. Alignment is normal. IMPRESSION: Extensive soft tissue swelling. No evidence of acute left ankle fracture. Electronically Signed   By: Caprice Renshaw M.D.   On: 07/26/2022 09:38    Impression/Plan: Severe anemia without any overt bleeding and heme negative. Anemia may be due to the left leg trauma question hematoma especially with his cirrhosis. I do not think he needs an EGD/colonoscopy at this time and would manage  conservatively. Elevated bilirubin likely chronic and would follow. Low sodium diet. Supportive care. Defer leg management to primary team. Will f/u.    LOS: 1 day   Shirley Friar  07/27/2022, 6:33 PM  Questions please call [phone]

## 2022-07-27 NOTE — Progress Notes (Addendum)
PROGRESS NOTE  [name] FYB:[id number] DOB: [birth year]   PCP: Malka So., MD  Patient is from: Home.  Independent at baseline.  DOA: 07/26/2022 LOS: 1  Chief complaints Chief Complaint  Patient presents with   Leg Injury   Leg Swelling    left     Brief Narrative / Interim history: 36 year old M with PMH of alcoholic cirrhosis with EV, GAVE and PHG, anemia, thrombocytopenia, anemia, thrombocytopenia and obesity presenting with acutely worsening left leg swelling and pain after he was hit by lawnmower, and admitted for acute symptomatic anemia with Hgb down to 4.6 (from 8.1 about 6 months prior).  He has no melena or hematochezia.  Hemoccult negative.  T. bili elevated to 5.4 with indirect predominance.  LDH elevated to 250.  Left ankle and left leg x-ray without fracture but soft tissue swelling.  Blood transfusion ordered.  Oncology consulted.    Subjective: Hemoglobin remained low at 5.9 after 3 units of blood. Leg swelling and pain has improved.  Patient denies melena or hematochezia.   He denies chest pain, dyspnea, GI or UTI symptoms.  Objective: Vitals:   07/27/22 0645 07/27/22 0935 07/27/22 1015 07/27/22 1225  BP: (!) 146/69 (!) 147/70 (!) 147/71 (!) 148/72  Pulse: 87 84 77 77  Resp: 20 20 18 17   Temp: 99 F (37.2 C) 99.5 F (37.5 C) 98.9 F (37.2 C) 98.5 F (36.9 C)  TempSrc: Oral Oral Oral Oral  SpO2: 100% 100% 100% 100%  Weight:      Height:        Examination:  GENERAL: No apparent distress.  Nontoxic. HEENT: MMM.  Vision and hearing grossly intact.  NECK: Supple.  No apparent JVD.  RESP:  No IWOB.  Fair aeration bilaterally. CVS:  RRR. Heart sounds normal.  ABD/GI/GU: BS+. Abd soft, NTND.  MSK/EXT:  Moves extremities.  Left leg edema or swelling with golf ball size anterior shin swelling SKIN: no apparent skin lesion or wound NEURO: Awake, alert and oriented appropriately.  No apparent focal neuro deficit. PSYCH: Calm. Normal affect.    Procedures:  None  Microbiology summarized: None  Assessment and plan: Principal Problem:   Iron deficiency anemia Active Problems:   Essential hypertension   Alcoholic cirrhosis of liver without ascites (HCC)   GERD (gastroesophageal reflux disease)   Coagulopathy (HCC)   Hematoma   Hyperbilirubinemia   Anemia of chronic disease   Thrombocytopenia (HCC)  Iron deficiency anemia/anemia of chronic disease: unclear if this is acute or chronic.  Left leg hematoma is not large enough to explain.  Patient denies melena or hematochezia.  Hemoccult negative.  He has history of alcoholic liver cirrhosis.  He has a splenomegaly on his CT about 6 months prior.  His bilirubin is elevated but seems to be chronic.  It seems he has gotten 4 units so far.  Hgb improved only 2 7.0. Recent Labs    01/12/22 2013 01/13/22 0719 01/14/22 0519 01/14/22 2008 01/16/22 0338 01/21/22 1000 07/26/22 1026 07/26/22 1701 07/27/22 0427 07/27/22 1241  HGB 4.6* 6.4* 7.6* 8.0* 8.3* 8.1* 4.6* 4.9* 5.9* 7.0*  -[facility] consulted -Hematology/oncology following -Monitor H&H. -Monitor H&H and transfuse to keep Hgb > 7.0 -Agree with IV iron infusion.   Alcoholic cirrhosis without ascites/EV/GV/PHG/splenomegaly: No longer drinks alcohol.  Liver 07/29/22 suggests fatty deposition and cirrhosis.  There is a gallbladder wall thickening without gallstones.  Patient has no GI symptoms -Started on Lasix and Aldactone this admission -GI consulted -Continue PPI  Hyperbilirubinemia/elevated AST/coagulopathy: Likely due to liver cirrhosis.  Hyperbilirubinemia seems chronic. -Continue monitoring   LLE hematoma/swelling/pain: Improved.  Ultrasound negative for DVT. -Encouraged leg elevation   Elevated blood pressure: Not on antihypertensive meds at home. -Started on Lasix and Aldactone this admission   GERD -Continue Protonix  Hypokalemia -Monitor replenish as appropriate  Thrombocytopenia: Likely due to liver  cirrhosis and splenomegaly -SCD for VTE prophylaxis  Obesity Body mass index is 33.72 kg/m.          DVT prophylaxis:  Place TED hose Start: 07/26/22 1627  Code Status: Full code Family Communication: None at bedside Level of care: Telemetry Status is: Inpatient Remains inpatient appropriate because: Iron deficiency anemia requiring blood transfusion and close monitoring of hemoglobin.  Hemoglobin only improved from 4.6 to 7.0 after 4 units of blood.    Final disposition: Home once medically cleared Consultants:  Hematology Gastroenterology  Sch Meds:  Scheduled Meds:  sodium chloride   Intravenous Once   furosemide  40 mg Oral Daily   pantoprazole  40 mg Oral Daily   potassium chloride  40 mEq Oral Q4H   spironolactone  50 mg Oral Daily   Continuous Infusions: PRN Meds:.morphine injection  Antimicrobials: Anti-infectives (From admission, onward)    None        I have personally reviewed the following labs and images: CBC: Recent Labs  Lab 07/26/22 1026 07/26/22 1701 07/27/22 0427 07/27/22 1241  WBC 6.0  --  4.8 4.7  NEUTROABS 3.6  --  2.6  --   HGB 4.6* 4.9* 5.9* 7.0*  HCT 16.6* 17.4* 19.8* 22.3*  MCV 68.6*  --  71.0* 71.5*  PLT 127*  --  120* 115*   BMP &GFR Recent Labs  Lab 07/26/22 1026 07/27/22 0427  NA 139 135  K 3.5 3.0*  CL 103 103  CO2 27 26  GLUCOSE 112* 95  BUN 6 7  CREATININE 0.63 0.62  CALCIUM 8.1* 7.6*  MG  --  1.9   Estimated Creatinine Clearance: 153 mL/min (by C-G formula based on SCr of 0.62 mg/dL). Liver & Pancreas: Recent Labs  Lab 07/26/22 1026 07/27/22 0427  AST 72* 65*  ALT 18 20  ALKPHOS 75 86  BILITOT 5.4* 6.9*  PROT 8.4* 8.4*  ALBUMIN 3.0* 2.6*   No results for input(s): "LIPASE", "AMYLASE" in the last 168 hours. No results for input(s): "AMMONIA" in the last 168 hours. Diabetic: No results for input(s): "HGBA1C" in the last 72 hours. No results for input(s): "GLUCAP" in the last 168 hours. Cardiac  Enzymes: Recent Labs  Lab 07/27/22 0427  CKTOTAL 189   No results for input(s): "PROBNP" in the last 8760 hours. Coagulation Profile: Recent Labs  Lab 07/26/22 1026 07/27/22 1241  INR 2.1* 2.1*   Thyroid Function Tests: No results for input(s): "TSH", "T4TOTAL", "FREET4", "T3FREE", "THYROIDAB" in the last 72 hours. Lipid Profile: No results for input(s): "CHOL", "HDL", "LDLCALC", "TRIG", "CHOLHDL", "LDLDIRECT" in the last 72 hours. Anemia Panel: Recent Labs    07/26/22 1217  VITAMINB12 1,261*  FOLATE 5.6*  FERRITIN 13*  TIBC 330  IRON 19*  RETICCTPCT 2.9   Urine analysis:    Component Value Date/Time   COLORURINE AMBER (A) 01/12/2022 0942   APPEARANCEUR CLEAR 01/12/2022 0942   LABSPEC 1.016 01/12/2022 0942   PHURINE 6.0 01/12/2022 0942   GLUCOSEU NEGATIVE 01/12/2022 0942   HGBUR SMALL (A) 01/12/2022 [place] NEGATIVE 01/12/2022 [place] 01/12/2022 0942   PROTEINUR  NEGATIVE 01/12/2022 0942   NITRITE NEGATIVE 01/12/2022 0942   LEUKOCYTESUR NEGATIVE 01/12/2022 0942   Sepsis Labs: Invalid input(s): "PROCALCITONIN", "LACTICIDVEN"  Microbiology: No results found for this or any previous visit (from the past 240 hour(s)).  Radiology Studies: US Abdomen Limited RUQ (LIVER/GB)  Result Date: 07/26/2022 CLINICAL DATA:  Elevated LFTs. EXAM: ULTRASOUND ABDOMEN LIMITED RIGHT UPPER QUADRANT COMPARISON:  04/25/2021 FINDINGS: Gallbladder: Gallbladder is distended with what appears to be irregular gallbladder wall thickening up to 3-4 mm. No sonographic Eulah Pont sign reported by the sonographer. No evidence for gallstones. No pericholecystic fluid. Common bile duct: Diameter: 3 mm Liver: Diffusely increased echogenicity of liver parenchyma suggests fatty deposition. Nodular contour raises the question of cirrhosis. Portal vein is patent on color Doppler imaging with normal direction of blood flow towards the liver. Other: None. IMPRESSION: 1. Diffusely  increased echogenicity of the liver parenchyma suggests fatty deposition. Nodular contour raises the question of cirrhosis. 2. Gallbladder wall thickening without evidence for gallstones. No sonographic Murphy sign. Findings may be related to systemic disease. If there is clinical concern for acute cholecystitis, nuclear scintigraphy may prove helpful to assess for cystic duct obstruction. Electronically Signed   By: Kennith Center M.D.   On: 07/26/2022 17:57      Ralpheal Zappone T. [facility]  If 7PM-7AM, please contact night-coverage [email or web address] 07/27/2022, 1:35 PM

## 2022-07-27 NOTE — Progress Notes (Signed)
Pt hgb at 1300 is 7.0. MD aware and will hold off on 2 units of PRBC ordered.

## 2022-07-28 DIAGNOSIS — E669 Obesity, unspecified: Secondary | ICD-10-CM

## 2022-07-28 DIAGNOSIS — D649 Anemia, unspecified: Secondary | ICD-10-CM | POA: Diagnosis not present

## 2022-07-28 DIAGNOSIS — E66811 Obesity, class 1: Secondary | ICD-10-CM

## 2022-07-28 DIAGNOSIS — E876 Hypokalemia: Secondary | ICD-10-CM

## 2022-07-28 LAB — CBC WITH DIFFERENTIAL/PLATELET
Abs Immature Granulocytes: 0.14 10*3/uL — ABNORMAL HIGH (ref 0.00–0.07)
Basophils Absolute: 0.1 10*3/uL (ref 0.0–0.1)
Basophils Relative: 1 %
Eosinophils Absolute: 0.3 10*3/uL (ref 0.0–0.5)
Eosinophils Relative: 4 %
HCT: 23.8 % — ABNORMAL LOW (ref 39.0–52.0)
Hemoglobin: 7.2 g/dL — ABNORMAL LOW (ref 13.0–17.0)
Immature Granulocytes: 2 %
Lymphocytes Relative: 10 %
Lymphs Abs: 0.7 10*3/uL (ref 0.7–4.0)
MCH: 22 pg — ABNORMAL LOW (ref 26.0–34.0)
MCHC: 30.3 g/dL (ref 30.0–36.0)
MCV: 72.8 fL — ABNORMAL LOW (ref 80.0–100.0)
Monocytes Absolute: 1.3 10*3/uL — ABNORMAL HIGH (ref 0.1–1.0)
Monocytes Relative: 19 %
Neutro Abs: 4.3 10*3/uL (ref 1.7–7.7)
Neutrophils Relative %: 64 %
Platelets: 126 10*3/uL — ABNORMAL LOW (ref 150–400)
RBC: 3.27 MIL/uL — ABNORMAL LOW (ref 4.22–5.81)
RDW: 25.9 % — ABNORMAL HIGH (ref 11.5–15.5)
WBC: 6.7 10*3/uL (ref 4.0–10.5)
nRBC: 4.8 % — ABNORMAL HIGH (ref 0.0–0.2)

## 2022-07-28 LAB — PROTIME-INR
INR: 2.2 — ABNORMAL HIGH (ref 0.8–1.2)
Prothrombin Time: 23.9 s — ABNORMAL HIGH (ref 11.4–15.2)

## 2022-07-28 LAB — HAPTOGLOBIN: Haptoglobin: <10 mg/dL — ABNORMAL LOW (ref 17–317)

## 2022-07-28 LAB — COMPREHENSIVE METABOLIC PANEL
ALT: 20 U/L (ref 0–44)
AST: 58 U/L — ABNORMAL HIGH (ref 15–41)
Albumin: 2.6 g/dL — ABNORMAL LOW (ref 3.5–5.0)
Alkaline Phosphatase: 77 U/L (ref 38–126)
Anion gap: 5 (ref 5–15)
BUN: 8 mg/dL (ref 6–20)
CO2: 26 mmol/L (ref 22–32)
Calcium: 7.9 mg/dL — ABNORMAL LOW (ref 8.9–10.3)
Chloride: 102 mmol/L (ref 98–111)
Creatinine, Ser: 0.55 mg/dL — ABNORMAL LOW (ref 0.61–1.24)
GFR, Estimated: >60 mL/min (ref >60)
Glucose, Bld: 93 mg/dL (ref 70–99)
Potassium: 3.3 mmol/L — ABNORMAL LOW (ref 3.5–5.1)
Sodium: 133 mmol/L — ABNORMAL LOW (ref 135–145)
Total Bilirubin: 9.1 mg/dL — ABNORMAL HIGH (ref 0.3–1.2)
Total Protein: 8.1 g/dL (ref 6.5–8.1)

## 2022-07-28 LAB — AFP TUMOR MARKER: AFP, Serum, Tumor Marker: 16.4 ng/mL — ABNORMAL HIGH (ref 0.0–6.9)

## 2022-07-28 LAB — PHOSPHORUS: Phosphorus: 2.6 mg/dL (ref 2.5–4.6)

## 2022-07-28 LAB — MAGNESIUM: Magnesium: 1.5 mg/dL — ABNORMAL LOW (ref 1.7–2.4)

## 2022-07-28 MED ORDER — SPIRONOLACTONE 50 MG PO TABS: 50.0000 mg | ORAL_TABLET | Freq: Every day | ORAL | 1 refills | Status: DC

## 2022-07-28 MED ORDER — POTASSIUM CHLORIDE CRYS ER 20 MEQ PO TBCR
40.0000 meq | EXTENDED_RELEASE_TABLET | ORAL | Status: AC
  Administered 2022-07-28 (×2): 40 meq via ORAL
  Filled 2022-07-28 (×2): qty 2

## 2022-07-28 MED ORDER — FUROSEMIDE 40 MG PO TABS: 40.0000 mg | ORAL_TABLET | Freq: Every day | ORAL | 1 refills | Status: DC

## 2022-07-28 MED ORDER — MAGNESIUM SULFATE 2 GM/50ML IV SOLN
2.0000 g | Freq: Once | INTRAVENOUS | Status: AC
  Administered 2022-07-28: 2 g via INTRAVENOUS
  Filled 2022-07-28: qty 50

## 2022-07-28 NOTE — Progress Notes (Signed)
AVS given to patient and explained at the bedside. Medications and follow up appointments have been explained with pt verbalizing understanding.  

## 2022-07-28 NOTE — Discharge Summary (Addendum)
Physician Discharge Summary  [name] [id number] DOB: 1986-07-15 DOA: 07/26/2022  PCP: Lilian Coma., MD  Admit date: 07/26/2022 Discharge date: 07/28/2022 Admitted From: Home Disposition: Home Recommendations for Outpatient Follow-up:  Follow up with PCP in 1 to 2 weeks Oncology to arrange outpatient follow-up Check CMP and CBC in 1 to 2 weeks Please follow up on the following pending results: AFP tumor marker, haptoglobin  Home Health: Not indicated Equipment/Devices: Not indicated  Discharge Condition: Stable CODE STATUS: Full code  Follow-up Information     Lilian Coma., MD. Schedule an appointment as soon as possible for a visit in 1 week(s).   Specialty: Internal Medicine Contact information: [address] [place] [address] [place] [address] [phone]                 Hospital course 36 year old M with PMH of alcoholic cirrhosis with EV, GAVE and PHG, hyperbilirubinemia, anemia, thrombocytopenia, anemia, thrombocytopenia and obesity presenting with acutely worsening left leg swelling and pain after he was hit by lawnmower, and admitted for acute symptomatic anemia with Hgb down to 4.6 (from 8.1 about 6 months prior).  He has no evidence of GI bleed.  Hemoccult negative.  T. bili elevated to 5.4 with indirect predominance.  LDH elevated to 250.  Left ankle and left leg x-ray without fracture but soft tissue swelling. Blood transfusion ordered.  Oncology consulted.  Patient received 4 units of blood and IV iron.  Hemoglobin improved to 7.0, and 7.2 on the day of discharge.  He was cleared for discharge by GI and oncology for outpatient follow-up.  Advised to continue elevating his left leg for swelling.  Started on Lasix and Aldactone given liver cirrhosis. Hypokalemia and hypomagnesemia replenished prior to discharge. Recommend repeat CBC and CMP in 1 to 2 weeks.   See individual problem list below for more.   Problems addressed during this  hospitalization Principal Problem:   Iron deficiency anemia Active Problems:   Essential hypertension   Alcoholic cirrhosis of liver without ascites (HCC)   GERD (gastroesophageal reflux disease)   Coagulopathy (HCC)   Hematoma   Hyperbilirubinemia   Anemia of chronic disease   Thrombocytopenia (HCC)   Obesity (BMI 30-39.9)   Hypokalemia   Hypomagnesemia              Vital signs Vitals:   07/27/22 1225 07/27/22 2051 07/28/22 0528 07/28/22 1158  BP: (!) 148/72 (!) 160/70 (!) 151/65 (!) 158/77  Pulse: 77 68 80 88  Temp: 98.5 F (36.9 C) 98.6 F (37 C) 98.9 F (37.2 C) 98.7 F (37.1 C)  Resp: 17 18 18 18   Height:      Weight:      SpO2: 100% 100% 100% 100%  TempSrc: Oral Oral Oral Oral  BMI (Calculated):         Discharge exam  GENERAL: No apparent distress.  Nontoxic. HEENT: MMM.  Sclera icteric. NECK: Supple.  No apparent JVD.  RESP:  No IWOB.  Fair aeration bilaterally. CVS:  RRR. Heart sounds normal.  ABD/GI/GU: BS+. Abd soft, NTND.  MSK/EXT:  Moves extremities. Left leg edema/swelling with with golf ball size hematoma on anterior shin SKIN: no apparent skin lesion or wound NEURO: Awake and alert. Oriented appropriately.  No apparent focal neuro deficit. PSYCH: Calm. Normal affect.   Discharge Instructions Discharge Instructions     Call MD for:  extreme fatigue   Complete by: As directed    Call MD for:  redness, tenderness,  or signs of infection (pain, swelling, redness, odor or green/yellow discharge around incision site)   Complete by: As directed    Call MD for:  severe uncontrolled pain   Complete by: As directed    Call MD for:  temperature >100.4   Complete by: As directed    Diet - low sodium heart healthy   Complete by: As directed    Discharge instructions   Complete by: As directed    It has been a pleasure taking care of you!  You were hospitalized due to severe anemia for which you have been treated with blood transfusion.  Your  hemoglobin improved and remained stable after transfusion.  Follow-up with your primary care doctor in 1 to 2 weeks to have your hemoglobin rechecked.  Continue taking iron pills.  Iron pills could cause constipation and dark stool.  You may take stool softener such as Colace as needed for constipation.  Avoid any over-the-counter pain medication other than some plain Tylenol.  In regards to left leg swelling and pain, we strongly recommend elevating the left leg while you are seated or lying down.  We have started you on Lasix and Aldactone for your liver cirrhosis.  Continue taking these medications as prescribed.  Minimize salt/sodium intake to less than 2 g a day.  Follow-up with oncology per their recommendation.   Take care,   Increase activity slowly   Complete by: As directed       Allergies as of 07/28/2022   No Known Allergies      Medication List     TAKE these medications    ferrous sulfate 325 (65 FE) MG tablet Take 325 mg by mouth daily with breakfast.   furosemide 40 MG tablet Commonly known as: LASIX Take 1 tablet (40 mg total) by mouth daily. Start taking on: July 29, 2022   pantoprazole 40 MG tablet Commonly known as: Protonix Take 1 tablet (40 mg total) by mouth daily.   spironolactone 50 MG tablet Commonly known as: ALDACTONE Take 1 tablet (50 mg total) by mouth daily. Start taking on: July 29, 2022        Consultations: Oncology/hematology Gastroenterology  Procedures/Studies:   US Abdomen Limited RUQ (LIVER/GB)  Result Date: 07/26/2022 CLINICAL DATA:  Elevated LFTs. EXAM: ULTRASOUND ABDOMEN LIMITED RIGHT UPPER QUADRANT COMPARISON:  04/25/2021 FINDINGS: Gallbladder: Gallbladder is distended with what appears to be irregular gallbladder wall thickening up to 3-4 mm. No sonographic Percell Miller sign reported by the sonographer. No evidence for gallstones. No pericholecystic fluid. Common bile duct: Diameter: 3 mm Liver: Diffusely increased  echogenicity of liver parenchyma suggests fatty deposition. Nodular contour raises the question of cirrhosis. Portal vein is patent on color Doppler imaging with normal direction of blood flow towards the liver. Other: None. IMPRESSION: 1. Diffusely increased echogenicity of the liver parenchyma suggests fatty deposition. Nodular contour raises the question of cirrhosis. 2. Gallbladder wall thickening without evidence for gallstones. No sonographic Murphy sign. Findings may be related to systemic disease. If there is clinical concern for acute cholecystitis, nuclear scintigraphy may prove helpful to assess for cystic duct obstruction. Electronically Signed   By: Misty Stanley M.D.   On: 07/26/2022 17:57   US Venous Img Lower Unilateral Left  Result Date: 07/26/2022 CLINICAL DATA:  Pain and swelling post injury EXAM: LEFT LOWER EXTREMITY VENOUS DOPPLER ULTRASOUND TECHNIQUE: Gray-scale sonography with compression, as well as color and duplex ultrasound, were performed to evaluate the deep venous system(s) from the level of the  common femoral vein through the popliteal and proximal calf veins. COMPARISON:  None Available. FINDINGS: VENOUS Normal compressibility of the common femoral, superficial femoral, and popliteal veins, as well as the visualized calf veins. Visualized portions of profunda femoral vein and great saphenous vein unremarkable. No filling defects to suggest DVT on grayscale or color Doppler imaging. Doppler waveforms show normal direction of venous flow, normal respiratory plasticity and response to augmentation. Limited views of the contralateral common femoral vein are unremarkable. OTHER None. Limitations: none IMPRESSION: Negative. Electronically Signed   By: Lucrezia Europe M.D.   On: 07/26/2022 10:21   DG Tibia/Fibula Left  Result Date: 07/26/2022 CLINICAL DATA:  Acute left leg pain and swelling after injury yesterday. EXAM: LEFT TIBIA AND FIBULA - 2 VIEW COMPARISON:  None Available.  FINDINGS: There is no evidence of fracture or other focal bone lesions. Soft tissues are unremarkable. IMPRESSION: Negative. Electronically Signed   By: Marijo Conception M.D.   On: 07/26/2022 10:10   DG Ankle Left Port  Result Date: 07/26/2022 CLINICAL DATA:  Injury EXAM: PORTABLE LEFT ANKLE - 2 VIEW COMPARISON:  None Available. FINDINGS: There is extensive soft tissue swelling of the left ankle and distal lower leg. There is no evidence of acute fracture. Alignment is normal. IMPRESSION: Extensive soft tissue swelling. No evidence of acute left ankle fracture. Electronically Signed   By: Maurine Simmering M.D.   On: 07/26/2022 09:38       The results of significant diagnostics from this hospitalization (including imaging, microbiology, ancillary and laboratory) are listed below for reference.     Microbiology: No results found for this or any previous visit (from the past 240 hour(s)).   Labs:  CBC: Recent Labs  Lab 07/26/22 1026 07/26/22 1701 07/27/22 0427 07/27/22 1241 07/27/22 1627 07/28/22 1001  WBC 6.0  --  4.8 4.7 5.2 6.7  NEUTROABS 3.6  --  2.6  --   --  4.3  HGB 4.6* 4.9* 5.9* 7.0* 7.0* 7.2*  HCT 16.6* 17.4* 19.8* 22.3* 22.7* 23.8*  MCV 68.6*  --  71.0* 71.5* 71.8* 72.8*  PLT 127*  --  120* 115* 118* 126*   BMP &GFR Recent Labs  Lab 07/26/22 1026 07/27/22 0427 07/28/22 0435  NA 139 135 133*  K 3.5 3.0* 3.3*  CL 103 103 102  CO2 27 26 26   GLUCOSE 112* 95 93  BUN 6 7 8   CREATININE 0.63 0.62 0.55*  CALCIUM 8.1* 7.6* 7.9*  MG  --  1.9 1.5*  PHOS  --   --  2.6   Estimated Creatinine Clearance: 153 mL/min (A) (by C-G formula based on SCr of 0.55 mg/dL (L)). Liver & Pancreas: Recent Labs  Lab 07/26/22 1026 07/27/22 0427 07/28/22 0435  AST 72* 65* 58*  ALT 18 20 20   ALKPHOS 75 86 77  BILITOT 5.4* 6.9* 9.1*  PROT 8.4* 8.4* 8.1  ALBUMIN 3.0* 2.6* 2.6*   No results for input(s): "LIPASE", "AMYLASE" in the last 168 hours. No results for input(s): "AMMONIA" in  the last 168 hours. Diabetic: No results for input(s): "HGBA1C" in the last 72 hours. No results for input(s): "GLUCAP" in the last 168 hours. Cardiac Enzymes: Recent Labs  Lab 07/27/22 0427  CKTOTAL 189   No results for input(s): "PROBNP" in the last 8760 hours. Coagulation Profile: Recent Labs  Lab 07/26/22 1026 07/27/22 1241 07/28/22 0435  INR 2.1* 2.1* 2.2*   Thyroid Function Tests: No results for input(s): "TSH", "T4TOTAL", "FREET4", "T3FREE", "THYROIDAB"  in the last 72 hours. Lipid Profile: No results for input(s): "CHOL", "HDL", "LDLCALC", "TRIG", "CHOLHDL", "LDLDIRECT" in the last 72 hours. Anemia Panel: Recent Labs    07/26/22 1217  VITAMINB12 1,261*  FOLATE 5.6*  FERRITIN 13*  TIBC 330  IRON 19*  RETICCTPCT 2.9   Urine analysis:    Component Value Date/Time   COLORURINE AMBER (A) 01/12/2022 0942   APPEARANCEUR CLEAR 01/12/2022 0942   LABSPEC 1.016 01/12/2022 0942   PHURINE 6.0 01/12/2022 0942   GLUCOSEU NEGATIVE 01/12/2022 0942   HGBUR SMALL (A) 01/12/2022 0942   BILIRUBINUR NEGATIVE 01/12/2022 0942   KETONESUR NEGATIVE 01/12/2022 0942   PROTEINUR NEGATIVE 01/12/2022 0942   NITRITE NEGATIVE 01/12/2022 0942   LEUKOCYTESUR NEGATIVE 01/12/2022 0942   Sepsis Labs: Invalid input(s): "PROCALCITONIN", "LACTICIDVEN"   SIGNED:  Mercy Riding, MD  [facility] 07/28/2022, 1:27 PM

## 2022-07-28 NOTE — Progress Notes (Signed)
[name]   DOB:1986-03-17   OM#:[id number]    ASSESSMENT & PLAN:   Iron deficiency anemia on background history of GI bleed and esophageal varices  Patient has received blood transfusion and IV iron Hemoglobin is stable He will need outpatient follow-up I will arrange for follow-up with Dr. 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 The patient knows to call the clinic with any problems, questions or concerns.   The total time spent in the appointment was 25 minutes encounter with patients including review of chart and various tests results, discussions about plan of care and coordination of care plan  Heath Lark, MD 07/28/2022 12:18 PM  Subjective:  He feels good.  Denies bleeding.  He tolerated IV iron infusion well  Objective:  Vitals:   07/28/22 0528 07/28/22 1158  BP: (!) 151/65 (!) 158/77  Pulse: 80 88  Resp: 18 18  Temp: 98.9 F (37.2 C) 98.7 F (37.1 C)  SpO2: 100% 100%     Intake/Output Summary (Last 24 hours) at 07/28/2022 1218 Last data filed at 07/27/2022 2330 Gross per 24 hour  Intake 287 ml  Output --  Net 287 ml    GENERAL:alert, no distress and comfortable NEURO: alert & oriented x 3 with fluent speech, no focal motor/sensory deficits   Labs:  Recent Labs     01/14/22 0519 01/15/22 0302 01/16/22 0338 01/21/22 1000 07/26/22 1026 07/26/22 1217 07/27/22 0427 07/28/22 0435  NA 133*  --   --    < > 139  --  135 133*  K 3.4*  --   --    < > 3.5  --  3.0* 3.3*  CL 105  --   --    < > 103  --  103 102  CO2 20*  --   --    < > 27  --  26 26  GLUCOSE 87  --   --    < > 112*  --  95 93  BUN 5*  --   --    < > 6  --  7 8  CREATININE 0.77  --   --    < > 0.63  --  0.62 0.55*  CALCIUM 8.2*  --   --    < > 8.1*  --  7.6* 7.9*  GFRNONAA >60  --   --    < > >60  --  >60 >60  PROT 8.2* 8.4* 7.9   < > 8.4*  --  8.4* 8.1  ALBUMIN 2.3* 2.3* 2.2*   < > 3.0*  --  2.6* 2.6*  AST 62* 54* 54*   < > 72*  --  65* 58*  ALT 21 20 21    < > 18  --  20 20  ALKPHOS 70 72 75   < > 75  --  86 77  BILITOT 8.7* 8.1* 7.8*   < > 5.4*  --  6.9* 9.1*  BILIDIR 3.3* 3.3* 3.5*  --   --  2.4*  --   --   IBILI 5.4* 4.8* 4.3*  --   --   --   --   --    < > = values in this interval not displayed.    Studies:  US Abdomen Limited RUQ (LIVER/GB)  Result Date: 07/26/2022 CLINICAL DATA:  Elevated LFTs. EXAM: ULTRASOUND ABDOMEN LIMITED RIGHT UPPER QUADRANT COMPARISON:  04/25/2021 FINDINGS: Gallbladder: Gallbladder is distended with what appears to be irregular gallbladder wall thickening up to 3-4 mm. No sonographic Eulah Pont sign reported by the sonographer. No evidence for gallstones. No pericholecystic fluid. Common bile duct: Diameter: 3 mm Liver: Diffusely increased echogenicity of liver parenchyma suggests fatty deposition. Nodular contour raises the question of cirrhosis. Portal vein is patent on color Doppler imaging with normal direction of blood flow towards the liver. Other: None. IMPRESSION: 1. Diffusely increased echogenicity of the liver parenchyma suggests fatty deposition. Nodular contour raises the question of cirrhosis. 2. Gallbladder wall thickening without evidence for gallstones. No sonographic Murphy sign. Findings may be related to systemic disease. If there is clinical  concern for acute cholecystitis, nuclear scintigraphy may prove helpful to assess for cystic duct obstruction. Electronically Signed   By: Kennith Center M.D.   On: 07/26/2022 17:57   US Venous Img Lower Unilateral Left  Result Date: 07/26/2022 CLINICAL DATA:  Pain and swelling post injury EXAM: LEFT LOWER EXTREMITY VENOUS DOPPLER ULTRASOUND TECHNIQUE: Gray-scale sonography with compression, as well as color and duplex ultrasound, were performed to evaluate the deep venous system(s) from the level of the common femoral vein through the popliteal and proximal calf veins. COMPARISON:  None Available. FINDINGS: VENOUS Normal compressibility of the common femoral, superficial femoral, and popliteal veins, as well as the visualized calf veins. Visualized portions of profunda femoral vein and great saphenous vein unremarkable. No filling defects to suggest DVT on grayscale or color Doppler imaging. Doppler waveforms show normal direction of venous flow, normal respiratory plasticity and response to augmentation. Limited views of the contralateral common femoral vein are unremarkable. OTHER None. Limitations: none IMPRESSION: Negative. Electronically Signed   By: Corlis Leak M.D.   On: 07/26/2022 10:21   DG Tibia/Fibula Left  Result Date: 07/26/2022 CLINICAL DATA:  Acute left leg pain and swelling after injury yesterday. EXAM: LEFT TIBIA AND FIBULA - 2 VIEW COMPARISON:  None Available. FINDINGS: There is no evidence of fracture or other focal bone lesions. Soft tissues are unremarkable. IMPRESSION: Negative. Electronically Signed   By: Lupita Raider M.D.   On: 07/26/2022 10:10   DG Ankle Left Port  Result Date: 07/26/2022 CLINICAL DATA:  Injury EXAM: PORTABLE LEFT ANKLE - 2 VIEW COMPARISON:  None Available. FINDINGS: There is extensive soft tissue swelling of the left ankle and distal lower leg. There is no evidence of acute fracture. Alignment is normal. IMPRESSION: Extensive soft tissue swelling. No  evidence of acute left ankle fracture. Electronically Signed   By: Caprice Renshaw M.D.   On: 07/26/2022 09:38

## 2022-07-28 NOTE — Progress Notes (Signed)
[facility] Gastroenterology Progress Note  [name] 36 y.o. Apr 18, 1986   Subjective: Feels good. Sitting in chair dressed ready to go home. Denies abdominal pain.  Objective: Vital signs: Vitals:   07/28/22 0528 07/28/22 1158  BP: (!) 151/65 (!) 158/77  Pulse: 80 88  Resp: 18 18  Temp: 98.9 F (37.2 C) 98.7 F (37.1 C)  SpO2: 100% 100%    Physical Exam: Gen: alert, no acute distress, well-nourished  HEENT: anicteric sclera CV: RRR Chest: CTA B Abd: soft, nontender, nondistended, +BS Ext: no edema  Lab Results: Recent Labs    07/27/22 0427 07/28/22 0435  NA 135 133*  K 3.0* 3.3*  CL 103 102  CO2 26 26  GLUCOSE 95 93  BUN 7 8  CREATININE 0.62 0.55*  CALCIUM 7.6* 7.9*  MG 1.9 1.5*  PHOS  --  2.6   Recent Labs    07/27/22 0427 07/28/22 0435  AST 65* 58*  ALT 20 20  ALKPHOS 86 77  BILITOT 6.9* 9.1*  PROT 8.4* 8.1  ALBUMIN 2.6* 2.6*   Recent Labs    07/27/22 0427 07/27/22 1241 07/27/22 1627 07/28/22 1001  WBC 4.8   < > 5.2 6.7  NEUTROABS 2.6  --   --  4.3  HGB 5.9*   < > 7.0* 7.2*  HCT 19.8*   < > 22.7* 23.8*  MCV 71.0*   < > 71.8* 72.8*  PLT 120*   < > 118* 126*   < > = values in this interval not displayed.      Assessment/Plan: Anemia in the setting of cirrhosis without any overt bleeding. Elevated bilirubin likely chronic. Patient being d/c today. F/U with [place] in 6-8 weeks. Will sign off. Call if questions.   [name] 07/28/2022, 1:02 PM  Questions please call [phone] ID: [name], male   DOB: Nov 27, 1985, 36 y.o.   MRN: [id number]

## 2022-07-28 NOTE — Clinical Social Work Note (Signed)
  Transition of Care [facility]) Screening Note   Patient Details  Name: [name] Date of Birth: 1986/06/08   [facility] of [facility]) CM/SW Contact:    Ross Ludwig, LCSW Phone Number: 07/28/2022, 12:27 PM    Transition of Care Department [facility]) has reviewed patient and no TOC needs have been identified at this time. We will continue to monitor patient advancement through interdisciplinary progression rounds. If new patient transition needs arise, please place a TOC consult.

## 2022-07-29 ENCOUNTER — Other Ambulatory Visit: Payer: Self-pay

## 2022-07-29 ENCOUNTER — Emergency Department (HOSPITAL_BASED_OUTPATIENT_CLINIC_OR_DEPARTMENT_OTHER): Payer: Medicaid Other

## 2022-07-29 ENCOUNTER — Encounter (HOSPITAL_BASED_OUTPATIENT_CLINIC_OR_DEPARTMENT_OTHER): Payer: Self-pay

## 2022-07-29 ENCOUNTER — Emergency Department (HOSPITAL_BASED_OUTPATIENT_CLINIC_OR_DEPARTMENT_OTHER)
Admission: EM | Admit: 2022-07-29 | Discharge: 2022-07-30 | Disposition: A | Discharge status: Home / Self Care | Payer: Medicaid Other | Attending: Emergency Medicine | Admitting: Emergency Medicine

## 2022-07-29 DIAGNOSIS — D508 Other iron deficiency anemias: Secondary | ICD-10-CM | POA: Diagnosis present

## 2022-07-29 DIAGNOSIS — K7682 Hepatic encephalopathy: Secondary | ICD-10-CM | POA: Insufficient documentation

## 2022-07-29 LAB — CBC WITH DIFFERENTIAL/PLATELET
Abs Immature Granulocytes: 0.04 10*3/uL (ref 0.00–0.07)
Abs Immature Granulocytes: 0.12 10*3/uL — ABNORMAL HIGH (ref 0.00–0.07)
Basophils Absolute: 0.1 10*3/uL (ref 0.0–0.1)
Basophils Absolute: 0.1 10*3/uL (ref 0.0–0.1)
Basophils Relative: 2 %
Basophils Relative: 2 %
Eosinophils Absolute: 0.2 10*3/uL (ref 0.0–0.5)
Eosinophils Absolute: 0.1 10*3/uL (ref 0.0–0.5)
Eosinophils Relative: 3 %
Eosinophils Relative: 2 %
HCT: 16.6 % — ABNORMAL LOW (ref 39.0–52.0)
HCT: 22.6 % — ABNORMAL LOW (ref 39.0–52.0)
Hemoglobin: 4.6 g/dL — CL (ref 13.0–17.0)
Hemoglobin: 7 g/dL — ABNORMAL LOW (ref 13.0–17.0)
Immature Granulocytes: 1 %
Immature Granulocytes: 1 %
Lymphocytes Relative: 20 %
Lymphocytes Relative: 16 %
Lymphs Abs: 1.2 10*3/uL (ref 0.7–4.0)
Lymphs Abs: 1.5 10*3/uL (ref 0.7–4.0)
MCH: 19 pg — ABNORMAL LOW (ref 26.0–34.0)
MCH: 22.2 pg — ABNORMAL LOW (ref 26.0–34.0)
MCHC: 27.7 g/dL — ABNORMAL LOW (ref 30.0–36.0)
MCHC: 31 g/dL (ref 30.0–36.0)
MCV: 68.6 fL — ABNORMAL LOW (ref 80.0–100.0)
MCV: 71.7 fL — ABNORMAL LOW (ref 80.0–100.0)
Monocytes Absolute: 0.9 10*3/uL (ref 0.1–1.0)
Monocytes Absolute: 2.4 10*3/uL — ABNORMAL HIGH (ref 0.1–1.0)
Monocytes Relative: 15 %
Monocytes Relative: 25 %
Neutro Abs: 3.6 10*3/uL (ref 1.7–7.7)
Neutro Abs: 5.2 10*3/uL (ref 1.7–7.7)
Neutrophils Relative %: 59 %
Neutrophils Relative %: 54 %
Platelets: 127 10*3/uL — ABNORMAL LOW (ref 150–400)
Platelets: 193 10*3/uL (ref 150–400)
RBC: 2.42 MIL/uL — ABNORMAL LOW (ref 4.22–5.81)
RBC: 3.15 MIL/uL — ABNORMAL LOW (ref 4.22–5.81)
RDW: 26.1 % — ABNORMAL HIGH (ref 11.5–15.5)
RDW: 26.5 % — ABNORMAL HIGH (ref 11.5–15.5)
WBC: 6 10*3/uL (ref 4.0–10.5)
WBC: 9.6 10*3/uL (ref 4.0–10.5)
nRBC: 0.8 % — ABNORMAL HIGH (ref 0.0–0.2)
nRBC: 4.2 % — ABNORMAL HIGH (ref 0.0–0.2)

## 2022-07-29 LAB — COMPREHENSIVE METABOLIC PANEL
ALT: 20 U/L (ref 0–44)
AST: 66 U/L — ABNORMAL HIGH (ref 15–41)
Albumin: 3.2 g/dL — ABNORMAL LOW (ref 3.5–5.0)
Alkaline Phosphatase: 86 U/L (ref 38–126)
Anion gap: 11 (ref 5–15)
BUN: 12 mg/dL (ref 6–20)
CO2: 24 mmol/L (ref 22–32)
Calcium: 9 mg/dL (ref 8.9–10.3)
Chloride: 96 mmol/L — ABNORMAL LOW (ref 98–111)
Creatinine, Ser: 1.18 mg/dL (ref 0.61–1.24)
GFR, Estimated: >60 mL/min (ref >60)
Glucose, Bld: 92 mg/dL (ref 70–99)
Potassium: 3.5 mmol/L (ref 3.5–5.1)
Sodium: 131 mmol/L — ABNORMAL LOW (ref 135–145)
Total Bilirubin: 10.7 mg/dL — ABNORMAL HIGH (ref 0.3–1.2)
Total Protein: 9 g/dL — ABNORMAL HIGH (ref 6.5–8.1)

## 2022-07-29 LAB — PROTIME-INR
INR: 1.8 — ABNORMAL HIGH (ref 0.8–1.2)
Prothrombin Time: 20.7 seconds — ABNORMAL HIGH (ref 11.4–15.2)

## 2022-07-29 LAB — AMMONIA: Ammonia: 49 umol/L — ABNORMAL HIGH (ref 9–35)

## 2022-07-29 NOTE — ED Triage Notes (Signed)
Pt seen here last week and just released from [facility] yesterday. Pt was prescribed spironolactone and lasix and now complains of confusion, nosebleeds, and disorientation since starting the medications. A&O x4 in triage.

## 2022-07-29 NOTE — ED Provider Notes (Signed)
[facility] EMERGENCY DEPT  Provider Note  CSN: [id number] Arrival date & time: 07/29/22 1849  History Chief Complaint  Patient presents with   Medication Reaction    [name] is a 36 y.o. 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 He was discharged yesterday with new Rx for lasix and spironolactone. He reports since he got home he has been confused, thought he was talking to people who weren't there. Had one short, self-limited nosebleed. His fiancee at bedside confirms he has been more confused but was clear headed while in the hospital. He has not had any falls or head injuries. He thinks the new medications have caused the symptoms.    Home Medications Prior to Admission medications   Medication Sig Start Date End Date Taking? Authorizing Provider  lactulose (CHRONULAC) 10 GM/15ML solution Take 15 mLs (10 g total) by mouth 3 (three) times daily. 07/30/22 08/29/22 Yes Truddie Hidden, MD  ferrous sulfate 325 (65 FE) MG tablet Take 1 tablet (325 mg total) by mouth daily with breakfast. 07/30/22 08/29/22  Truddie Hidden, MD  furosemide (LASIX) 40 MG tablet Take 1 tablet (40 mg total) by mouth daily. 07/29/22   Mercy Riding, MD  pantoprazole (PROTONIX) 40 MG tablet Take 1 tablet (40 mg total) by mouth daily. Patient not taking: Reported on 07/26/2022 04/28/21 07/26/22  Elvin So, MD  spironolactone (ALDACTONE) 50 MG tablet Take 1 tablet (50 mg total) by mouth daily. 07/29/22   Mercy Riding, MD     Allergies    Patient has no known allergies.   Review of Systems   Review of Systems Please see HPI for pertinent positives and negatives  Physical Exam BP (!) 140/65   Pulse 90   Temp 99.4 F (37.4 C) (Oral)   Resp 18   Ht 5\' 9"  (1.753 m)   Wt 102.1 kg   SpO2 100%   BMI 33.23 kg/m   Physical Exam Vitals and nursing note reviewed.  Constitutional:       Appearance: Normal appearance.  HENT:     Head: Normocephalic and atraumatic.     Nose: Nose normal.     Mouth/Throat:     Mouth: Mucous membranes are moist.  Eyes:     General: Scleral icterus present.     Extraocular Movements: Extraocular movements intact.     Conjunctiva/sclera: Conjunctivae normal.  Cardiovascular:     Rate and Rhythm: Normal rate.  Pulmonary:     Effort: Pulmonary effort is normal.     Breath sounds: Normal breath sounds.  Abdominal:     General: Abdomen is flat.     Palpations: Abdomen is soft.     Tenderness: There is no abdominal tenderness.  Musculoskeletal:        General: No swelling. Normal range of motion.     Cervical back: Neck supple.     Right lower leg: Edema present.     Left lower leg: Edema present.  Skin:    General: Skin is warm and dry.     Coloration: Skin is jaundiced.  Neurological:     General: No focal deficit present.     Mental Status: He is alert and oriented to person, place, and time.     Cranial Nerves: No cranial nerve deficit.     Sensory: No sensory deficit.     Motor: No weakness.     Gait: Gait normal.  Psychiatric:        Mood and Affect: Mood normal.     Comments: Not currently hallucinating     ED Results / Procedures / Treatments   EKG None  Procedures Procedures  Medications Ordered in the ED Medications  lactulose (CHRONULAC) 10 GM/15ML solution 10 g (10 g Oral Given 07/30/22 0015)    Initial Impression and Plan  Patient with confusion after recent admission for liver failure and anemia, s/p multiple transfusions. He thinks his confusion is caused by his spironolactone or lasix however much more likely to be secondary to his liver disease. Ammonia was not checked during his admission. Will send labs, including ammonia. Also send for head CT to eval spontaneous hemorrhage as INR was >2 during his recent admission. He is not currently hallucinating and is answering all questions.   ED Course    Clinical Course as of 07/30/22 0136  Mon Jul 29, 2022  2341 I personally viewed the images from radiology studies and agree with radiologist interpretation: CT neg for acute process.   [CS]  2346 INR is mildly elevated, improved from recent admission. Ammonia is also slightly elevated but unknown baseline. [CS]  Tue Jul 30, 2022  0005 CBC with anemia, at recent baseline since transfusion. PLT is improved. CMP with worsening bilirubin but otherwise at baseline.  [CS]  0132 Patient given a dose of lactulose in the ED. Questions regarding the use of this medication as well as his other new medications answered for patient, fiancee and mother at bedside. Recommend he continue to abstain from EtOH, take his medications as prescribed and follow up with PCP for a recheck in a few days to see if he needs a dose escalation of the lactulose. RTED for any worsening or worrisome symptoms or significant changes in mental status, bleeding complications or other concerns.  [CS]    Clinical Course User Index [CS] Pollyann Savoy, MD     MDM Rules/Calculators/A&P Medical Decision Making Problems Addressed: Hepatic encephalopathy [facility]): acute illness or injury Other iron deficiency anemia: chronic illness or injury  Amount and/or Complexity of Data Reviewed Labs: ordered. Decision-making details documented in ED Course. Radiology: ordered and independent interpretation performed. Decision-making details documented in ED Course.  Risk OTC drugs. Prescription drug management.    Final Clinical Impression(s) / ED Diagnoses Final diagnoses:  Hepatic encephalopathy (HCC)  Other iron deficiency anemia    Rx / DC Orders ED Discharge Orders          Ordered    lactulose (CHRONULAC) 10 GM/15ML solution  3 times daily        07/30/22 0136    ferrous sulfate 325 (65 FE) MG tablet  Daily with breakfast        07/30/22 0136             Pollyann Savoy, MD 07/30/22 [phone]

## 2022-07-30 LAB — TYPE AND SCREEN
ABO/RH(D): O POS
Antibody Screen: NEGATIVE
Unit division: 0
Unit division: 0
Unit division: 0
Unit division: 0
Unit division: 0
Unit division: 0

## 2022-07-30 LAB — BPAM RBC
Blood Product Expiration Date: 202311262359
Blood Product Expiration Date: 202311262359
Blood Product Expiration Date: 202311262359
Blood Product Expiration Date: 202311262359
Blood Product Expiration Date: 202311262359
Blood Product Expiration Date: 202311262359
ISSUE DATE / TIME: 202310271623
ISSUE DATE / TIME: 202310271942
ISSUE DATE / TIME: 202310280624
Unit Type and Rh: 5100
Unit Type and Rh: 5100
Unit Type and Rh: 5100
Unit Type and Rh: 5100
Unit Type and Rh: 5100
Unit Type and Rh: 5100

## 2022-07-30 MED ORDER — LACTULOSE 10 GM/15ML PO SOLN: 10.0000 g | Freq: Three times a day (TID) | ORAL | 0 refills | Status: AC

## 2022-07-30 MED ORDER — FERROUS SULFATE 325 (65 FE) MG PO TABS: 325.0000 mg | ORAL_TABLET | Freq: Every day | ORAL | 0 refills | Status: AC

## 2022-07-30 MED ORDER — LACTULOSE 10 GM/15ML PO SOLN
10.0000 g | Freq: Once | ORAL | Status: AC
  Administered 2022-07-30: 10 g via ORAL
  Filled 2022-07-30: qty 30

## 2022-07-31 DIAGNOSIS — E7220 Disorder of urea cycle metabolism, unspecified: Secondary | ICD-10-CM | POA: Insufficient documentation

## 2022-08-20 DIAGNOSIS — R772 Abnormality of alphafetoprotein: Secondary | ICD-10-CM | POA: Insufficient documentation

## 2022-08-27 ENCOUNTER — Telehealth: Payer: Self-pay | Admitting: *Deleted

## 2022-08-27 ENCOUNTER — Inpatient Hospital Stay: Payer: MEDICAID | Attending: Internal Medicine

## 2022-08-27 ENCOUNTER — Inpatient Hospital Stay: Payer: MEDICAID | Admitting: Oncology

## 2022-08-27 ENCOUNTER — Inpatient Hospital Stay: Payer: MEDICAID

## 2022-08-27 NOTE — Telephone Encounter (Signed)
PC to patient regarding missed appointments today, no answer, left VM - informed patient he missed his appointments, he may call our scheduling department to reschedule, [phone].

## 2022-10-25 ENCOUNTER — Other Ambulatory Visit: Payer: Self-pay

## 2022-10-25 ENCOUNTER — Inpatient Hospital Stay (HOSPITAL_COMMUNITY)
Admission: EM | Admit: 2022-10-25 | Discharge: 2022-11-02 | DRG: 812 | Disposition: A | Discharge status: Home / Self Care | Payer: Medicaid Other | Attending: Family Medicine | Admitting: Family Medicine

## 2022-10-25 ENCOUNTER — Encounter (HOSPITAL_COMMUNITY): Payer: Self-pay | Admitting: Emergency Medicine

## 2022-10-25 DIAGNOSIS — E669 Obesity, unspecified: Secondary | ICD-10-CM | POA: Diagnosis present

## 2022-10-25 DIAGNOSIS — R5381 Other malaise: Secondary | ICD-10-CM | POA: Diagnosis present

## 2022-10-25 DIAGNOSIS — R600 Localized edema: Secondary | ICD-10-CM | POA: Diagnosis present

## 2022-10-25 DIAGNOSIS — I8510 Secondary esophageal varices without bleeding: Secondary | ICD-10-CM | POA: Diagnosis present

## 2022-10-25 DIAGNOSIS — F17290 Nicotine dependence, other tobacco product, uncomplicated: Secondary | ICD-10-CM | POA: Diagnosis present

## 2022-10-25 DIAGNOSIS — D500 Iron deficiency anemia secondary to blood loss (chronic): Principal | ICD-10-CM | POA: Diagnosis present

## 2022-10-25 DIAGNOSIS — I10 Essential (primary) hypertension: Secondary | ICD-10-CM | POA: Diagnosis present

## 2022-10-25 DIAGNOSIS — K7682 Hepatic encephalopathy: Secondary | ICD-10-CM | POA: Diagnosis not present

## 2022-10-25 DIAGNOSIS — D509 Iron deficiency anemia, unspecified: Secondary | ICD-10-CM | POA: Diagnosis present

## 2022-10-25 DIAGNOSIS — K920 Hematemesis: Secondary | ICD-10-CM | POA: Diagnosis not present

## 2022-10-25 DIAGNOSIS — D649 Anemia, unspecified: Secondary | ICD-10-CM | POA: Diagnosis not present

## 2022-10-25 DIAGNOSIS — I868 Varicose veins of other specified sites: Secondary | ICD-10-CM | POA: Diagnosis present

## 2022-10-25 DIAGNOSIS — I864 Gastric varices: Secondary | ICD-10-CM | POA: Diagnosis present

## 2022-10-25 DIAGNOSIS — K766 Portal hypertension: Secondary | ICD-10-CM | POA: Diagnosis present

## 2022-10-25 DIAGNOSIS — S91002A Unspecified open wound, left ankle, initial encounter: Secondary | ICD-10-CM | POA: Diagnosis present

## 2022-10-25 DIAGNOSIS — K7030 Alcoholic cirrhosis of liver without ascites: Secondary | ICD-10-CM | POA: Diagnosis present

## 2022-10-25 DIAGNOSIS — D696 Thrombocytopenia, unspecified: Secondary | ICD-10-CM | POA: Diagnosis present

## 2022-10-25 DIAGNOSIS — F1010 Alcohol abuse, uncomplicated: Secondary | ICD-10-CM

## 2022-10-25 DIAGNOSIS — R7401 Elevation of levels of liver transaminase levels: Secondary | ICD-10-CM | POA: Diagnosis present

## 2022-10-25 DIAGNOSIS — E871 Hypo-osmolality and hyponatremia: Secondary | ICD-10-CM | POA: Diagnosis not present

## 2022-10-25 DIAGNOSIS — K0510 Chronic gingivitis, plaque induced: Secondary | ICD-10-CM | POA: Diagnosis present

## 2022-10-25 DIAGNOSIS — F10220 Alcohol dependence with intoxication, uncomplicated: Secondary | ICD-10-CM | POA: Diagnosis present

## 2022-10-25 DIAGNOSIS — K3189 Other diseases of stomach and duodenum: Secondary | ICD-10-CM | POA: Diagnosis present

## 2022-10-25 DIAGNOSIS — E538 Deficiency of other specified B group vitamins: Secondary | ICD-10-CM | POA: Diagnosis present

## 2022-10-25 DIAGNOSIS — K219 Gastro-esophageal reflux disease without esophagitis: Secondary | ICD-10-CM | POA: Diagnosis present

## 2022-10-25 DIAGNOSIS — Z7141 Alcohol abuse counseling and surveillance of alcoholic: Secondary | ICD-10-CM

## 2022-10-25 DIAGNOSIS — R040 Epistaxis: Secondary | ICD-10-CM | POA: Diagnosis not present

## 2022-10-25 DIAGNOSIS — D732 Chronic congestive splenomegaly: Secondary | ICD-10-CM | POA: Diagnosis present

## 2022-10-25 DIAGNOSIS — E876 Hypokalemia: Secondary | ICD-10-CM | POA: Diagnosis present

## 2022-10-25 DIAGNOSIS — F10239 Alcohol dependence with withdrawal, unspecified: Secondary | ICD-10-CM | POA: Diagnosis not present

## 2022-10-25 DIAGNOSIS — Y906 Blood alcohol level of 120-199 mg/100 ml: Secondary | ICD-10-CM | POA: Diagnosis present

## 2022-10-25 DIAGNOSIS — F10939 Alcohol use, unspecified with withdrawal, unspecified: Secondary | ICD-10-CM | POA: Diagnosis present

## 2022-10-25 DIAGNOSIS — R443 Hallucinations, unspecified: Secondary | ICD-10-CM | POA: Diagnosis not present

## 2022-10-25 DIAGNOSIS — K449 Diaphragmatic hernia without obstruction or gangrene: Secondary | ICD-10-CM | POA: Diagnosis present

## 2022-10-25 DIAGNOSIS — R791 Abnormal coagulation profile: Secondary | ICD-10-CM | POA: Diagnosis present

## 2022-10-25 DIAGNOSIS — Z6831 Body mass index (BMI) 31.0-31.9, adult: Secondary | ICD-10-CM

## 2022-10-25 DIAGNOSIS — Z79899 Other long term (current) drug therapy: Secondary | ICD-10-CM

## 2022-10-25 DIAGNOSIS — R7303 Prediabetes: Secondary | ICD-10-CM | POA: Diagnosis present

## 2022-10-25 LAB — COMPREHENSIVE METABOLIC PANEL
ALT: 27 U/L (ref 0–44)
AST: 115 U/L — ABNORMAL HIGH (ref 15–41)
Albumin: 2.1 g/dL — ABNORMAL LOW (ref 3.5–5.0)
Alkaline Phosphatase: 89 U/L (ref 38–126)
Anion gap: 11 (ref 5–15)
BUN: 6 mg/dL (ref 6–20)
CO2: 26 mmol/L (ref 22–32)
Calcium: 7.5 mg/dL — ABNORMAL LOW (ref 8.9–10.3)
Chloride: 100 mmol/L (ref 98–111)
Creatinine, Ser: 0.59 mg/dL — ABNORMAL LOW (ref 0.61–1.24)
GFR, Estimated: >60 mL/min (ref >60)
Glucose, Bld: 122 mg/dL — ABNORMAL HIGH (ref 70–99)
Potassium: 2.9 mmol/L — ABNORMAL LOW (ref 3.5–5.1)
Sodium: 137 mmol/L (ref 135–145)
Total Bilirubin: 8.8 mg/dL — ABNORMAL HIGH (ref 0.3–1.2)
Total Protein: 7.9 g/dL (ref 6.5–8.1)

## 2022-10-25 LAB — RETICULOCYTES
Immature Retic Fract: 28.9 % — ABNORMAL HIGH (ref 2.3–15.9)
RBC.: 1.87 MIL/uL — ABNORMAL LOW (ref 4.22–5.81)
Retic Count, Absolute: 69.8 10*3/uL (ref 19.0–186.0)
Retic Ct Pct: 3.7 % — ABNORMAL HIGH (ref 0.4–3.1)

## 2022-10-25 LAB — URINALYSIS, ROUTINE W REFLEX MICROSCOPIC
Bacteria, UA: NONE SEEN
Glucose, UA: NEGATIVE mg/dL
Ketones, ur: NEGATIVE mg/dL
Leukocytes,Ua: NEGATIVE
Nitrite: NEGATIVE
Protein, ur: 30 mg/dL — AB
Specific Gravity, Urine: 1.016 (ref 1.005–1.030)
pH: 7 (ref 5.0–8.0)

## 2022-10-25 LAB — PROTIME-INR
INR: 2.4 — ABNORMAL HIGH (ref 0.8–1.2)
Prothrombin Time: 25.7 seconds — ABNORMAL HIGH (ref 11.4–15.2)

## 2022-10-25 LAB — CBC WITH DIFFERENTIAL/PLATELET
Abs Immature Granulocytes: 0.03 10*3/uL (ref 0.00–0.07)
Basophils Absolute: 0.1 10*3/uL (ref 0.0–0.1)
Basophils Relative: 1 %
Eosinophils Absolute: 0.3 10*3/uL (ref 0.0–0.5)
Eosinophils Relative: 4 %
HCT: 16.8 % — ABNORMAL LOW (ref 39.0–52.0)
Hemoglobin: 4.9 g/dL — CL (ref 13.0–17.0)
Immature Granulocytes: 0 %
Lymphocytes Relative: 12 %
Lymphs Abs: 0.9 10*3/uL (ref 0.7–4.0)
MCH: 22.5 pg — ABNORMAL LOW (ref 26.0–34.0)
MCHC: 29.2 g/dL — ABNORMAL LOW (ref 30.0–36.0)
MCV: 77.1 fL — ABNORMAL LOW (ref 80.0–100.0)
Monocytes Absolute: 0.7 10*3/uL (ref 0.1–1.0)
Monocytes Relative: 10 %
Neutro Abs: 5.2 10*3/uL (ref 1.7–7.7)
Neutrophils Relative %: 73 %
Platelets: 136 10*3/uL — ABNORMAL LOW (ref 150–400)
RBC: 2.18 MIL/uL — ABNORMAL LOW (ref 4.22–5.81)
RDW: 24.8 % — ABNORMAL HIGH (ref 11.5–15.5)
WBC: 7.2 10*3/uL (ref 4.0–10.5)
nRBC: 1 % — ABNORMAL HIGH (ref 0.0–0.2)

## 2022-10-25 LAB — PHOSPHORUS: Phosphorus: 3.2 mg/dL (ref 2.5–4.6)

## 2022-10-25 LAB — FERRITIN: Ferritin: 33 ng/mL (ref 24–336)

## 2022-10-25 LAB — PREPARE RBC (CROSSMATCH)

## 2022-10-25 LAB — IRON AND TIBC
Iron: 48 ug/dL (ref 45–182)
Saturation Ratios: 20 % (ref 17.9–39.5)
TIBC: 245 ug/dL — ABNORMAL LOW (ref 250–450)
UIBC: 197 ug/dL

## 2022-10-25 LAB — VITAMIN B12: Vitamin B-12: 1004 pg/mL — ABNORMAL HIGH (ref 180–914)

## 2022-10-25 LAB — HEMOGLOBIN AND HEMATOCRIT, BLOOD
HCT: 19.4 % — ABNORMAL LOW (ref 39.0–52.0)
Hemoglobin: 5.7 g/dL — CL (ref 13.0–17.0)

## 2022-10-25 LAB — LIPASE, BLOOD: Lipase: 56 U/L — ABNORMAL HIGH (ref 11–51)

## 2022-10-25 LAB — MAGNESIUM: Magnesium: 1.9 mg/dL (ref 1.7–2.4)

## 2022-10-25 LAB — BRAIN NATRIURETIC PEPTIDE: B Natriuretic Peptide: 84.9 pg/mL (ref 0.0–100.0)

## 2022-10-25 LAB — OCCULT BLOOD X 1 CARD TO LAB, STOOL: Fecal Occult Bld: NEGATIVE

## 2022-10-25 LAB — FOLATE: Folate: 3.2 ng/mL — ABNORMAL LOW (ref >5.9)

## 2022-10-25 LAB — ETHANOL: Alcohol, Ethyl (B): 194 mg/dL — ABNORMAL HIGH (ref <10)

## 2022-10-25 MED ORDER — POTASSIUM CHLORIDE CRYS ER 20 MEQ PO TBCR
40.0000 meq | EXTENDED_RELEASE_TABLET | Freq: Once | ORAL | Status: AC
  Administered 2022-10-25: 40 meq via ORAL
  Filled 2022-10-25: qty 2

## 2022-10-25 MED ORDER — LORAZEPAM 1 MG PO TABS
1.0000 mg | ORAL_TABLET | ORAL | Status: AC | PRN
  Administered 2022-10-27: 2 mg via ORAL
  Administered 2022-10-27 (×2): 1 mg via ORAL
  Administered 2022-10-27: 2 mg via ORAL
  Administered 2022-10-27: 4 mg via ORAL
  Filled 2022-10-25: qty 1
  Filled 2022-10-25: qty 2
  Filled 2022-10-25: qty 1
  Filled 2022-10-25: qty 2
  Filled 2022-10-25: qty 4

## 2022-10-25 MED ORDER — ONDANSETRON HCL 4 MG PO TABS: 4.0000 mg | ORAL_TABLET | Freq: Four times a day (QID) | ORAL | Status: DC | PRN

## 2022-10-25 MED ORDER — FUROSEMIDE 40 MG PO TABS
40.0000 mg | ORAL_TABLET | Freq: Every day | ORAL | Status: DC
  Administered 2022-10-25 – 2022-10-27 (×3): 40 mg via ORAL
  Filled 2022-10-25 (×3): qty 1

## 2022-10-25 MED ORDER — MAGNESIUM SULFATE 2 GM/50ML IV SOLN
2.0000 g | Freq: Once | INTRAVENOUS | Status: AC
  Administered 2022-10-25: 2 g via INTRAVENOUS
  Filled 2022-10-25: qty 50

## 2022-10-25 MED ORDER — FOLIC ACID 1 MG PO TABS
1.0000 mg | ORAL_TABLET | Freq: Every day | ORAL | Status: DC
  Administered 2022-10-25: 1 mg via ORAL
  Filled 2022-10-25: qty 1

## 2022-10-25 MED ORDER — LORAZEPAM 1 MG PO TABS
0.0000 mg | ORAL_TABLET | Freq: Two times a day (BID) | ORAL | Status: AC
  Administered 2022-10-27 – 2022-10-29 (×2): 1 mg via ORAL
  Filled 2022-10-25 (×2): qty 1

## 2022-10-25 MED ORDER — LACTULOSE 10 GM/15ML PO SOLN
10.0000 g | Freq: Two times a day (BID) | ORAL | Status: DC
  Administered 2022-10-25 – 2022-10-27 (×4): 10 g via ORAL
  Filled 2022-10-25 (×4): qty 15

## 2022-10-25 MED ORDER — LORAZEPAM 1 MG PO TABS
0.0000 mg | ORAL_TABLET | Freq: Four times a day (QID) | ORAL | Status: AC
  Administered 2022-10-25 (×2): 2 mg via ORAL
  Administered 2022-10-26 – 2022-10-27 (×4): 1 mg via ORAL
  Filled 2022-10-25 (×2): qty 1
  Filled 2022-10-25: qty 2
  Filled 2022-10-25 (×2): qty 1
  Filled 2022-10-25: qty 2

## 2022-10-25 MED ORDER — SODIUM CHLORIDE 0.9% IV SOLUTION: Freq: Once | INTRAVENOUS | Status: AC

## 2022-10-25 MED ORDER — ADULT MULTIVITAMIN W/MINERALS CH
1.0000 | ORAL_TABLET | Freq: Every day | ORAL | Status: DC
  Administered 2022-10-25 – 2022-11-02 (×7): 1 via ORAL
  Filled 2022-10-25 (×7): qty 1

## 2022-10-25 MED ORDER — SPIRONOLACTONE 25 MG PO TABS
50.0000 mg | ORAL_TABLET | Freq: Every day | ORAL | Status: DC
  Administered 2022-10-26 – 2022-10-27 (×2): 50 mg via ORAL
  Filled 2022-10-25 (×2): qty 2

## 2022-10-25 MED ORDER — PANTOPRAZOLE SODIUM 40 MG PO TBEC
40.0000 mg | DELAYED_RELEASE_TABLET | Freq: Every day | ORAL | Status: DC
  Administered 2022-10-25: 40 mg via ORAL
  Filled 2022-10-25: qty 1

## 2022-10-25 MED ORDER — THIAMINE MONONITRATE 100 MG PO TABS
100.0000 mg | ORAL_TABLET | Freq: Every day | ORAL | Status: DC
  Administered 2022-10-25 – 2022-11-02 (×7): 100 mg via ORAL
  Filled 2022-10-25 (×7): qty 1

## 2022-10-25 MED ORDER — PANTOPRAZOLE SODIUM 40 MG IV SOLR
40.0000 mg | Freq: Once | INTRAVENOUS | Status: AC
  Administered 2022-10-25: 40 mg via INTRAVENOUS
  Filled 2022-10-25: qty 10

## 2022-10-25 MED ORDER — FERROUS SULFATE 325 (65 FE) MG PO TABS
325.0000 mg | ORAL_TABLET | Freq: Every day | ORAL | Status: DC
  Administered 2022-10-26 – 2022-11-02 (×6): 325 mg via ORAL
  Filled 2022-10-25 (×6): qty 1

## 2022-10-25 MED ORDER — ACETAMINOPHEN 325 MG PO TABS: 650.0000 mg | ORAL_TABLET | Freq: Four times a day (QID) | ORAL | Status: DC | PRN

## 2022-10-25 MED ORDER — ACETAMINOPHEN 650 MG RE SUPP: 650.0000 mg | Freq: Four times a day (QID) | RECTAL | Status: DC | PRN

## 2022-10-25 MED ORDER — ONDANSETRON HCL 4 MG/2ML IJ SOLN
4.0000 mg | Freq: Four times a day (QID) | INTRAMUSCULAR | Status: DC | PRN
  Administered 2022-10-25: 4 mg via INTRAVENOUS
  Filled 2022-10-25: qty 2

## 2022-10-25 MED ORDER — THIAMINE HCL 100 MG/ML IJ SOLN
100.0000 mg | Freq: Every day | INTRAMUSCULAR | Status: DC
  Administered 2022-10-28 – 2022-11-01 (×2): 100 mg via INTRAVENOUS
  Filled 2022-10-25 (×4): qty 2

## 2022-10-25 NOTE — ED Notes (Addendum)
1st unit of Blood completed. No sx of reaction at this time, post VS due at 1724

## 2022-10-25 NOTE — ED Notes (Signed)
ED TO INPATIENT HANDOFF REPORT  ED Nurse Name and Phone #: Alroy Bailiff Name/Age/Gender [name] 37 y.o. male Room/Bed: WA08/WA08  Code Status   Code Status: Full Code  Home/SNF/Other Home Patient oriented to: self, place, time, and situation Is this baseline? Yes   Triage Complete: Triage complete  Chief Complaint Symptomatic anemia [D64.9]  Triage Note Patient reports HMG 5.1 drawn yesterday, reports he feels okay, he is no longer drinking. No blood in stool, no emesis. No weakness or dizziness.    Allergies No Known Allergies  Level of Care/Admitting Diagnosis ED Disposition     ED Disposition  Admit   Condition  --   Comment  Hospital Area: [facility] [[id number]]  Level of Care: Med-Surg [16]  May place patient in observation at [facility] or [facility] if equivalent level of care is available:: No  Covid Evaluation: Asymptomatic - no recent exposure (last 10 days) testing not required  Diagnosis: Symptomatic anemia [7829562]  Admitting Physician: Reubin Milan [[id number]]  Attending Physician: Reubin Milan [[id number]]          B Medical/Surgery History Past Medical History:  Diagnosis Date   Liver cirrhosis, alcoholic ([place])    Portal hypertension ([place])    Splenomegaly, congestive, chronic    Past Surgical History:  Procedure Laterality Date   COSMETIC SURGERY     ESOPHAGOGASTRODUODENOSCOPY N/A 04/26/2021   Procedure: ESOPHAGOGASTRODUODENOSCOPY (EGD);  Surgeon: Arta Silence, MD;  Location: [facility];  Service: Endoscopy;  Laterality: N/A;   MANDIBLE FRACTURE SURGERY       A IV Location/Drains/Wounds Patient Lines/Drains/Airways Status     Active Line/Drains/Airways     Name Placement date Placement time Site Days   Peripheral IV 10/25/22 20 G Left Antecubital 10/25/22  1135  Antecubital  less than 1            Intake/Output Last 24 hours  Intake/Output Summary (Last 24 hours) at 10/25/2022  1707 Last data filed at 10/25/2022 1707 Gross per 24 hour  Intake 387 ml  Output --  Net 387 ml    Labs/Imaging Results for orders placed or performed during the hospital encounter of 10/25/22 (from the past 48 hour(s))  CBC with Differential     Status: Abnormal   Collection Time: 10/25/22 11:35 AM  Result Value Ref Range   WBC 7.2 4.0 - 10.5 K/uL   RBC 2.18 (L) 4.22 - 5.81 MIL/uL   Hemoglobin 4.9 (LL) 13.0 - 17.0 g/dL    Comment: Reticulocyte Hemoglobin testing may be clinically indicated, consider ordering this additional test [id number] THIS CRITICAL RESULT HAS VERIFIED AND BEEN CALLED TO BOWEN,M. RN BY NICOLE MCCOY ON 01 26 2024 AT 1210, AND HAS BEEN READ BACK. CRITICAL RESULT VERIFIED    HCT 16.8 (L) 39.0 - 52.0 %   MCV 77.1 (L) 80.0 - 100.0 fL   MCH 22.5 (L) 26.0 - 34.0 pg   MCHC 29.2 (L) 30.0 - 36.0 g/dL   RDW 24.8 (H) 11.5 - 15.5 %   Platelets 136 (L) 150 - 400 K/uL   nRBC 1.0 (H) 0.0 - 0.2 %   Neutrophils Relative % 73 %   Neutro Abs 5.2 1.7 - 7.7 K/uL   Lymphocytes Relative 12 %   Lymphs Abs 0.9 0.7 - 4.0 K/uL   Monocytes Relative 10 %   Monocytes Absolute 0.7 0.1 - 1.0 K/uL   Eosinophils Relative 4 %   Eosinophils Absolute 0.3 0.0 - 0.5 K/uL  Basophils Relative 1 %   Basophils Absolute 0.1 0.0 - 0.1 K/uL   Immature Granulocytes 0 %   Abs Immature Granulocytes 0.03 0.00 - 0.07 K/uL   Schistocytes PRESENT    Polychromasia PRESENT    Target Cells PRESENT     Comment: Performed at [facility], [address], [place], [place] [address]  Comprehensive metabolic panel     Status: Abnormal   Collection Time: 10/25/22 11:35 AM  Result Value Ref Range   Sodium 137 135 - 145 mmol/L   Potassium 2.9 (L) 3.5 - 5.1 mmol/L   Chloride 100 98 - 111 mmol/L   CO2 26 22 - 32 mmol/L   Glucose, Bld 122 (H) 70 - 99 mg/dL    Comment: Glucose reference range applies only to samples taken after fasting for at least 8 hours.   BUN 6 6 - 20 mg/dL   Creatinine,  Ser 6.04 (L) 0.61 - 1.24 mg/dL   Calcium 7.5 (L) 8.9 - 10.3 mg/dL   Total Protein 7.9 6.5 - 8.1 g/dL   Albumin 2.1 (L) 3.5 - 5.0 g/dL   AST 540 (H) 15 - 41 U/L   ALT 27 0 - 44 U/L   Alkaline Phosphatase 89 38 - 126 U/L   Total Bilirubin 8.8 (H) 0.3 - 1.2 mg/dL   GFR, Estimated >98 >11 mL/min    Comment: (NOTE) Calculated using the CKD-EPI Creatinine Equation (2021)    Anion gap 11 5 - 15    Comment: Performed at [facility], [address], [place], [place] [address]  Lipase, blood     Status: Abnormal   Collection Time: 10/25/22 11:35 AM  Result Value Ref Range   Lipase 56 (H) 11 - 51 U/L    Comment: Performed at [facility], [address], [place], [place] [address]  Ethanol     Status: Abnormal   Collection Time: 10/25/22 11:35 AM  Result Value Ref Range   Alcohol, Ethyl (B) 194 (H) <10 mg/dL    Comment: (NOTE) Lowest detectable limit for serum alcohol is 10 mg/dL.  For medical purposes only. Performed at [facility], [address], [place], [place] [address]   Protime-INR     Status: Abnormal   Collection Time: 10/25/22 11:35 AM  Result Value Ref Range   Prothrombin Time 25.7 (H) 11.4 - 15.2 seconds   INR 2.4 (H) 0.8 - 1.2    Comment: (NOTE) INR goal varies based on device and disease states. Performed at [facility], [address] [place], [place] [address]   Type and screen [facility]     Status: None (Preliminary result)   Collection Time: 10/25/22 11:35 AM  Result Value Ref Range   ABO/RH(D) O POS    Antibody Screen NEG    Sample Expiration 10/28/2022,2359    Unit Number [id number]    Blood Component Type RED CELLS,LR    Unit division 00    Status of Unit ISSUED    Transfusion Status OK TO TRANSFUSE    Crossmatch Result      Compatible Performed at [facility], [address], [place], [place] [address]    Unit Number  [id number]    Blood Component Type RBC LR PHER2    Unit division 00    Status of Unit ALLOCATED    Transfusion Status OK TO TRANSFUSE    Crossmatch Result Compatible   Brain natriuretic peptide  Status: None   Collection Time: 10/25/22 11:35 AM  Result Value Ref Range   B Natriuretic Peptide 84.9 0.0 - 100.0 pg/mL    Comment: Performed at [facility], [place] [address], [place], [place] [address]  Reticulocytes     Status: Abnormal   Collection Time: 10/25/22 12:48 PM  Result Value Ref Range   Retic Ct Pct 3.7 (H) 0.4 - 3.1 %   RBC. 1.87 (L) 4.22 - 5.81 MIL/uL   Retic Count, Absolute 69.8 19.0 - 186.0 K/uL   Immature Retic Fract 28.9 (H) 2.3 - 15.9 %    Comment: Performed at [facility], [place] [address], [place], [place] [address]  Prepare RBC (crossmatch)     Status: None   Collection Time: 10/25/22 12:49 PM  Result Value Ref Range   Order Confirmation      ORDER PROCESSED BY BLOOD BANK Performed at [facility], [place] [address] [place], [place] [address]   Occult blood card to lab, stool     Status: None   Collection Time: 10/25/22 12:49 PM  Result Value Ref Range   Fecal Occult Bld NEGATIVE NEGATIVE    Comment: Performed at [facility], [place] [address] [place], [place] [address]  Vitamin B12     Status: Abnormal   Collection Time: 10/25/22 12:58 PM  Result Value Ref Range   Vitamin B-12 1,004 (H) 180 - 914 pg/mL    Comment: (NOTE) This assay is not validated for testing neonatal or myeloproliferative syndrome specimens for Vitamin B12 levels. Performed at [facility], [place] [address] [place], [place] [address]   Folate     Status: Abnormal   Collection Time: 10/25/22 12:58 PM  Result Value Ref Range   Folate 3.2 (L) >5.9 ng/mL    Comment: Performed at [facility], [place] [address], [place], [place] [address]  Iron and TIBC     Status: Abnormal    Collection Time: 10/25/22 12:58 PM  Result Value Ref Range   Iron 48 45 - 182 ug/dL   TIBC 245 (L) 250 - 450 ug/dL   Saturation Ratios 20 17.9 - 39.5 %   UIBC 197 ug/dL    Comment: Performed at [facility], [place] [address] [place], [place] [address]  Ferritin     Status: None   Collection Time: 10/25/22 12:58 PM  Result Value Ref Range   Ferritin 33 24 - 336 ng/mL    Comment: ICTERUS AT THIS LEVEL MAY AFFECT RESULT Performed at [place] [address] [place], [place] [address]   Urinalysis, Routine w reflex microscopic -Urine, Clean Catch     Status: Abnormal   Collection Time: 10/25/22  3:00 PM  Result Value Ref Range   Color, Urine AMBER (A) YELLOW    Comment: BIOCHEMICALS MAY BE AFFECTED BY COLOR   APPearance CLEAR CLEAR   Specific Gravity, Urine 1.016 1.005 - 1.030   pH 7.0 5.0 - 8.0   Glucose, UA NEGATIVE NEGATIVE mg/dL   Hgb urine dipstick MODERATE (A) NEGATIVE   Bilirubin Urine MODERATE (A) NEGATIVE   Ketones, ur NEGATIVE NEGATIVE mg/dL   Protein, ur 30 (A) NEGATIVE mg/dL   Nitrite NEGATIVE NEGATIVE   Leukocytes,Ua NEGATIVE NEGATIVE   RBC / HPF 21-50 0 - 5 RBC/hpf   WBC, UA 0-5 0 - 5 WBC/hpf   Bacteria, UA NONE SEEN NONE SEEN   Squamous Epithelial / HPF 0-5 0 -  5 /HPF   Mucus PRESENT     Comment: Performed at [facility], [address], [place], [place] [address]   No results found.  Pending Labs Unresulted Labs (From admission, onward)     Start     Ordered   10/26/22 0500  CBC  Tomorrow morning,   R        10/25/22 1538   10/26/22 0500  Comprehensive metabolic panel  Tomorrow morning,   R        10/25/22 1538   10/25/22 1406  [place]brand panel  Once,   URGENT        10/25/22 1405            Vitals/Pain Today's Vitals   10/25/22 1415 10/25/22 1430 10/25/22 1445 10/25/22 1509  BP: 118/72 126/72 128/68   Pulse: 89 87 84   Resp: 17  16   Temp:      TempSrc:      SpO2: 100% 100%  100%   Weight:    95 kg  Height:    5\' 9"  (1.753 m)  PainSc:        Isolation Precautions No active isolations  Medications Medications  acetaminophen (TYLENOL) tablet 650 mg (has no administration in time range)    Or  acetaminophen (TYLENOL) suppository 650 mg (has no administration in time range)  ondansetron (ZOFRAN) tablet 4 mg (has no administration in time range)    Or  ondansetron (ZOFRAN) injection 4 mg (has no administration in time range)  pantoprazole (PROTONIX) injection 40 mg (40 mg Intravenous Given 10/25/22 1154)  0.9 %  sodium chloride infusion (Manually program via Guardrails IV Fluids) (0 mLs Intravenous Stopped 10/25/22 1707)  potassium chloride SA (KLOR-CON M) CR tablet 40 mEq (40 mEq Oral Given 10/25/22 1328)    Mobility walks     Focused Assessments    R Recommendations: See Admitting Provider Note  Report given to:   Additional Notes:

## 2022-10-25 NOTE — ED Notes (Signed)
Blood bank has blood ready for this patient. Notified Mike,RN. 

## 2022-10-25 NOTE — H&P (Signed)
History and Physical    Patient: [name] PYK:[id number] DOB: 1985/11/13 DOA: 10/25/2022 DOS: the patient was seen and examined on 10/25/2022 PCP: Lilian Coma., MD  Patient coming from: Home  Chief Complaint:  Chief Complaint  Patient presents with   Labs Only   HPI: [name] is a 37 y.o. male with medical history significant of alcoholic liver cirrhosis, portal hypertension, congestive splenomegaly, hyperammonemia, elevated AFP, coagulopathy, active alcohol use hypertension, GERD, prediabetes, class I obesity who was sent to the emergency department by his PCP due to low hemoglobin level. He denied fatigue, hypersomnolence, or dyspnea.  No fever, chills, rhinorrhea, sore throat, wheezing or hemoptysis.  No chest pain, palpitations, diaphoresis, PND, orthopnea or pitting edema of the lower extremities.  No abdominal pain, nausea, emesis, diarrhea, constipation, melena or hematochezia.  No flank pain, dysuria, frequency or hematuria.  No polyuria, polydipsia, polyphagia or blurred vision.   The patient stated he had not had alcohol in the past 2 weeks, but given EtOH level he stated he last drank 2 days ago.  ED course: Initial vital signs were temperature 98.1 F, pulse 68, respirations 16, BP 128/69 mmHg O2 sat 100% on room air.  The patient received pantoprazole 40 mg IVP, KCl 40 meq p.o. and 2 unit blood transfusion was started.  Lab work: His urinalysis showed moderate hemoglobinuria, moderate bilirubinuria and proteinuria 30 mg/dL.  CBC showed a white count 7.2, hemoglobin 4.9 g/dL platelets 136.  PT 25.7 and INR 2.4.  Fecal occult blood was negative.  Folic acid was low at 3.2 ng/mL, B12 1004 pg/mL.  Iron studies were normal SF4 a TIBC of 245.  Alcohol level 194 mg/dL.  Lipase was 56.  CMP showed potassium of 2.9 mmol/L, the rest of the electrolytes were normal after calcium correction.  BUN 6, creatinine 0.59, glucose 122 and total bilirubin 8.8 mg/deciliter.  Total protein 7.9  and albumin 2.1 g/dL.  AST 115 ALT 27 and alkaline phosphatase 89.  BNP 84.9 pg/mL.   Review of Systems: As mentioned in the history of present illness. All other systems reviewed and are negative. Past Medical History:  Diagnosis Date   Liver cirrhosis, alcoholic ([place])    Portal hypertension (HCC)    Splenomegaly, congestive, chronic    Past Surgical History:  Procedure Laterality Date   COSMETIC SURGERY     ESOPHAGOGASTRODUODENOSCOPY N/A 04/26/2021   Procedure: ESOPHAGOGASTRODUODENOSCOPY (EGD);  Surgeon: Arta Silence, MD;  Location: [facility];  Service: Endoscopy;  Laterality: N/A;   MANDIBLE FRACTURE SURGERY     Social History:  reports that he has never smoked. He has never used smokeless tobacco. He reports that he does not currently use alcohol. He reports that he does not use drugs.  No Known Allergies  History reviewed. No pertinent family history.  Prior to Admission medications   Medication Sig Start Date End Date Taking? Authorizing Provider  pantoprazole (PROTONIX) 40 MG tablet Take 1 tablet (40 mg total) by mouth daily. 04/28/21 10/25/22 Yes Elvin So, MD  ferrous sulfate 325 (65 FE) MG tablet Take 1 tablet (325 mg total) by mouth daily with breakfast. 07/30/22 08/29/22  Truddie Hidden, MD  furosemide (LASIX) 40 MG tablet Take 1 tablet (40 mg total) by mouth daily. 07/29/22   Mercy Riding, MD  spironolactone (ALDACTONE) 50 MG tablet Take 1 tablet (50 mg total) by mouth daily. 07/29/22   Mercy Riding, MD    Physical Exam: Vitals:   10/25/22 1415 10/25/22 1430  10/25/22 1445 10/25/22 1509  BP: 118/72 126/72 128/68   Pulse: 89 87 84   Resp: 17  16   Temp:      TempSrc:      SpO2: 100% 100% 100%   Weight:    95 kg  Height:    5\' 9"  (1.753 m)   Physical Exam Vitals and nursing note reviewed.  Constitutional:      General: He is awake. He is not in acute distress.    Appearance: He is obese.  HENT:     Head: Normocephalic.     Nose: No  rhinorrhea.     Mouth/Throat:     Mouth: Mucous membranes are moist.  Eyes:     General: Scleral icterus present.     Pupils: Pupils are equal, round, and reactive to light.  Neck:     Vascular: No JVD.  Cardiovascular:     Rate and Rhythm: Normal rate and regular rhythm.     Heart sounds: S1 normal and S2 normal.  Pulmonary:     Effort: Pulmonary effort is normal.     Breath sounds: Normal breath sounds. No wheezing, rhonchi or rales.  Abdominal:     General: Bowel sounds are normal.     Palpations: Abdomen is soft.     Tenderness: There is no abdominal tenderness. There is no guarding.  Musculoskeletal:     Cervical back: Neck supple.     Right lower leg: No edema.     Left lower leg: No edema.  Skin:    General: Skin is warm and dry.  Neurological:     General: No focal deficit present.     Mental Status: He is alert and oriented to person, place, and time.  Psychiatric:        Mood and Affect: Mood normal.        Behavior: Behavior normal. Behavior is cooperative.   Data Reviewed:  Results are pending, will review when available.  Assessment and Plan: Principal Problem:   Symptomatic/iron deficiency anemia Observation/MedSurg. Continue PRBC transfusion. Follow-up hematocrit and hemoglobin.   Transfuse further if below 7 g/dL. Resume iron sulfate. Begin folic acid supplementation.  Active Problems:   Alcoholic cirrhosis of liver without ascites (HCC) With hyperbilirubinemia, hypoalbuminemia and coagulopathy. Presenting with:   Alcohol intoxication in active alcoholic without complication ([place]) CIWA protocol with lorazepam. Magnesium sulfate supplementation. Folate, MVI and thiamine. Consult transition of care team. Alcohol cessation advised.    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 Monitor platelet count.    Essential hypertension Continue furosemide and spironolactone.    Class 1 obesity BMI 30.93 kg/m. Lifestyle modifications. Follow-up  with PCP.    Hypokalemia Supplementing. Follow potassium level.    Prediabetes Check hemoglobin A1c.    GERD (gastroesophageal reflux disease) Resume PPI.    Advance Care Planning:   Code Status: Full Code   Consults:   Family Communication:   Severity of Illness: The appropriate patient status for this patient is OBSERVATION. Observation status is judged to be reasonable and necessary in order to provide the required intensity of service to ensure the patient's safety. The patient's presenting symptoms, physical exam findings, and initial radiographic and laboratory data in the context of their medical condition is felt to place them at decreased risk for further clinical deterioration. Furthermore, it is anticipated that the patient will be medically stable for discharge from the hospital within 2 midnights of admission.   Author: Reubin Milan, MD 10/25/2022 3:17 PM  For on call review [email or web address].   This document was prepared using Dragon voice recognition software and may contain some unintended transcription errors.

## 2022-10-25 NOTE — ED Provider Notes (Signed)
[place] EMERGENCY DEPARTMENT AT [facility] Provider Note  CSN: [id number] Arrival date & time: 10/25/22 1033  Chief Complaint(s) No chief complaint on file.  HPI [name] is a 37 y.o. male with past medical history as below, significant for etoh abuse, liver cirrhosis, chronic splenomegaly, gi bleed, hyperbilirubinemia who presents to the ED with complaint of low hemoglobin.  Reports has been absent from alcohol for more than 2 or 3 weeks.  He was seen at primary care office had labs drawn and was told to come to the ER due to low hemoglobin which was 5.1.  He otherwise is fairly asymptomatic, he has some mild fatigue but no dyspnea, no abdominal pain no fevers or chills.  No recent medication or diet changes.  He has a reduced appetite but is time p.o. intake without much difficulty.  No change in bowel or bladder function, denies melena or BRBPR.  Does report he has noticed some bleeding in his gums after brushing his teeth over the past 6 weeks, no rashes.  No bleeding from other mucous membranes.  History multiple transfusions in the past.  Past Medical History Past Medical History:  Diagnosis Date   Liver cirrhosis, alcoholic (HCC)    Portal hypertension (HCC)    Splenomegaly, congestive, chronic    Patient Active Problem List   Diagnosis Date Noted   Obesity (BMI 30-39.9) 07/28/2022   Hypokalemia 07/28/2022   Hypomagnesemia 07/28/2022   Symptomatic anemia 07/27/2022   Thrombocytopenia (HCC) 07/27/2022   GERD (gastroesophageal reflux disease) 07/26/2022   Coagulopathy (HCC) 07/26/2022   Hematoma 07/26/2022   Hyperbilirubinemia 07/26/2022   Iron deficiency anemia 01/12/2022   Alcoholic cirrhosis of liver without ascites (HCC) 05/21/2021   GI bleeding 04/25/2021   Essential hypertension 04/25/2021   Acute blood loss anemia 04/25/2021   Home Medication(s) Prior to Admission medications   Medication Sig Start Date End Date Taking? Authorizing Provider   ferrous sulfate 325 (65 FE) MG tablet Take 1 tablet (325 mg total) by mouth daily with breakfast. 07/30/22 08/29/22  Pollyann Savoy, MD  furosemide (LASIX) 40 MG tablet Take 1 tablet (40 mg total) by mouth daily. 07/29/22   Almon Hercules, MD  pantoprazole (PROTONIX) 40 MG tablet Take 1 tablet (40 mg total) by mouth daily. Patient not taking: Reported on 07/26/2022 04/28/21 07/26/22  Arsenio Loader, MD  spironolactone (ALDACTONE) 50 MG tablet Take 1 tablet (50 mg total) by mouth daily. 07/29/22   Almon Hercules, MD                                                                                                                                    Past Surgical History Past Surgical History:  Procedure Laterality Date   COSMETIC SURGERY     ESOPHAGOGASTRODUODENOSCOPY N/A 04/26/2021   Procedure: ESOPHAGOGASTRODUODENOSCOPY (EGD);  Surgeon: Willis Modena, MD;  Location: [facility];  Service: Endoscopy;  Laterality: N/A;  MANDIBLE FRACTURE SURGERY     Family History History reviewed. No pertinent family history.  Social History Social History   Tobacco Use   Smoking status: Never   Smokeless tobacco: Never  Vaping Use   Vaping Use: Every day  Substance Use Topics   Alcohol use: Not Currently    Comment: 3-4 times a week, none now.   Drug use: No   Allergies Patient has no known allergies.  Review of Systems Review of Systems  Constitutional:  Positive for fatigue. Negative for chills and fever.  HENT:  Negative for facial swelling and trouble swallowing.   Eyes:  Negative for photophobia and visual disturbance.  Respiratory:  Negative for cough and shortness of breath.   Cardiovascular:  Negative for chest pain and palpitations.  Gastrointestinal:  Negative for abdominal pain, nausea and vomiting.  Endocrine: Negative for polydipsia and polyuria.  Genitourinary:  Negative for difficulty urinating and hematuria.  Musculoskeletal:  Negative for gait problem and  joint swelling.  Skin:  Negative for pallor and rash.  Neurological:  Negative for syncope and headaches.  Psychiatric/Behavioral:  Negative for agitation and confusion.     Physical Exam Vital Signs  I have reviewed the triage vital signs BP 118/72   Pulse 89   Temp 98.1 F (36.7 C) (Oral)   Resp 17   Wt 94.8 kg   SpO2 100%   BMI 30.86 kg/m  Physical Exam Vitals and nursing note reviewed.  Constitutional:      General: He is not in acute distress.    Appearance: He is well-developed. He is obese. He is not diaphoretic.  HENT:     Head: Normocephalic and atraumatic.     Right Ear: External ear normal.     Left Ear: External ear normal.     Mouth/Throat:     Mouth: Mucous membranes are dry.  Eyes:     General: Scleral icterus present.  Cardiovascular:     Rate and Rhythm: Normal rate and regular rhythm.     Pulses: Normal pulses.     Heart sounds: Normal heart sounds.  Pulmonary:     Effort: Pulmonary effort is normal. No respiratory distress.     Breath sounds: Normal breath sounds.  Abdominal:     General: Abdomen is flat.     Palpations: Abdomen is soft.     Tenderness: There is no abdominal tenderness. There is no guarding or rebound.  Musculoskeletal:        General: No deformity.     Cervical back: No rigidity.     Right lower leg: No edema.     Left lower leg: No edema.  Skin:    General: Skin is warm and dry.     Capillary Refill: Capillary refill takes less than 2 seconds.  Neurological:     Mental Status: He is alert and oriented to person, place, and time.     GCS: GCS eye subscore is 4. GCS verbal subscore is 5. GCS motor subscore is 6.  Psychiatric:        Mood and Affect: Mood normal.        Behavior: Behavior normal.     ED Results and Treatments Labs (all labs ordered are listed, but only abnormal results are displayed) Labs Reviewed  CBC WITH DIFFERENTIAL/PLATELET - Abnormal; Notable for the following components:      Result Value   RBC  2.18 (*)    Hemoglobin 4.9 (*)    HCT 16.8 (*)  MCV 77.1 (*)    MCH 22.5 (*)    MCHC 29.2 (*)    RDW 24.8 (*)    Platelets 136 (*)    nRBC 1.0 (*)    All other components within normal limits  COMPREHENSIVE METABOLIC PANEL - Abnormal; Notable for the following components:   Potassium 2.9 (*)    Glucose, Bld 122 (*)    Creatinine, Ser 0.59 (*)    Calcium 7.5 (*)    Albumin 2.1 (*)    AST 115 (*)    Total Bilirubin 8.8 (*)    All other components within normal limits  LIPASE, BLOOD - Abnormal; Notable for the following components:   Lipase 56 (*)    All other components within normal limits  ETHANOL - Abnormal; Notable for the following components:   Alcohol, Ethyl (B) 194 (*)    All other components within normal limits  PROTIME-INR - Abnormal; Notable for the following components:   Prothrombin Time 25.7 (*)    INR 2.4 (*)    All other components within normal limits  VITAMIN B12 - Abnormal; Notable for the following components:   Vitamin B-12 1,004 (*)    All other components within normal limits  FOLATE - Abnormal; Notable for the following components:   Folate 3.2 (*)    All other components within normal limits  IRON AND TIBC - Abnormal; Notable for the following components:   TIBC 245 (*)    All other components within normal limits  RETICULOCYTES - Abnormal; Notable for the following components:   Retic Ct Pct 3.7 (*)    RBC. 1.87 (*)    Immature Retic Fract 28.9 (*)    All other components within normal limits  BRAIN NATRIURETIC PEPTIDE  FERRITIN  OCCULT BLOOD X 1 CARD TO LAB, STOOL  URINALYSIS, ROUTINE W REFLEX MICROSCOPIC  VON WILLEBRAND PANEL  TYPE AND SCREEN  PREPARE RBC (CROSSMATCH)                                                                                                                          Radiology No results found.  Pertinent labs & imaging results that were available during my care of the patient were reviewed by me and considered in  my medical decision making (see MDM for details).  Medications Ordered in ED Medications  pantoprazole (PROTONIX) injection 40 mg (40 mg Intravenous Given 10/25/22 1154)  0.9 %  sodium chloride infusion (Manually program via Guardrails IV Fluids) ( Intravenous New Bag/Given 10/25/22 1325)  potassium chloride SA (KLOR-CON M) CR tablet 40 mEq (40 mEq Oral Given 10/25/22 1328)  Procedures .Critical Care  Performed by: Jeanell Sparrow, DO Authorized by: Jeanell Sparrow, DO   Critical care provider statement:    Critical care time (minutes):  30   Critical care time was exclusive of:  Separately billable procedures and treating other patients   Critical care was necessary to treat or prevent imminent or life-threatening deterioration of the following conditions:  Circulatory failure   Critical care was time spent personally by me on the following activities:  Development of treatment plan with patient or surrogate, discussions with consultants, evaluation of patient's response to treatment, examination of patient, ordering and review of laboratory studies, ordering and review of radiographic studies, ordering and performing treatments and interventions, pulse oximetry, re-evaluation of patient's condition, review of old charts and obtaining history from patient or surrogate   Care discussed with: admitting provider     (including critical care time)  Medical Decision Making / ED Course   MDM:  Carvell Hoeffner is a 37 y.o. male with past medical history as below, significant for etoh abuse, liver cirrhosis, chronic splenomegaly, gi bleed, hyperbilirubinemia who presents to the ED with complaint of low hemoglobin.. The complaint involves an extensive differential diagnosis and also carries with it a high risk of complications and morbidity.  Serious etiology was  considered. Ddx includes but is not limited to: Acute blood loss anemia, metastatic disease, liver failure, ITP, TTP, less likely DIC, von Willebrand disease, etc.  On initial assessment the patient is: He is resting comfortably, no acute distress, not hypoxic, HDS Vital signs and nursing notes were reviewed  Clinical Course as of 10/25/22 1436  Fri Oct 25, 2022  1248 Hemoglobin(!!): 4.9 Was 7.0 2 mos ago, 5.1 recently on o/p labs. Plt 136. Consented for [facility] [SG]  1250 Potassium(!): 2.9 Replaced orally [SG]  1250 Total Bilirubin(!): 8.8 Similar to prior  [SG]  1309 Alcohol, Ethyl (B)(!): 194 Pt reports no etoh for 2-3 wks [SG]  1405 Pt with bleeding gums, send von willebrand panel [SG]    Clinical Course User Index [SG] Jeanell Sparrow, DO    Labs reviewed as above. Hemoccult was negative fortunately.  He has history of chronic alcohol abuse, liver cirrhosis due to the above.  Chronic anemia. PRBCs infusing. Patient reports mild improvement to his symptoms after intervention today. He has bleeding to his gums, von Willebrand panel ordered; his INR is also elev higher than prior. ETOH was elev unfortunately Recommend admission for the above.  Patient is agreeable.  Discussed with Dr. Olevia Bowens who accepts for admit.     Additional history obtained: -Additional history obtained from na -External records from outside source obtained and reviewed including: Chart review including previous notes, labs, imaging, consultation notes including prior ED visits, prior labs and imaging, home medications   Lab Tests: -I ordered, reviewed, and interpreted labs.   The pertinent results include:   Labs Reviewed  CBC WITH DIFFERENTIAL/PLATELET - Abnormal; Notable for the following components:      Result Value   RBC 2.18 (*)    Hemoglobin 4.9 (*)    HCT 16.8 (*)    MCV 77.1 (*)    MCH 22.5 (*)    MCHC 29.2 (*)    RDW 24.8 (*)    Platelets 136 (*)    nRBC 1.0 (*)    All other  components within normal limits  COMPREHENSIVE METABOLIC PANEL - Abnormal; Notable for the following components:   Potassium 2.9 (*)    Glucose, Bld 122 (*)  Creatinine, Ser 0.59 (*)    Calcium 7.5 (*)    Albumin 2.1 (*)    AST 115 (*)    Total Bilirubin 8.8 (*)    All other components within normal limits  LIPASE, BLOOD - Abnormal; Notable for the following components:   Lipase 56 (*)    All other components within normal limits  ETHANOL - Abnormal; Notable for the following components:   Alcohol, Ethyl (B) 194 (*)    All other components within normal limits  PROTIME-INR - Abnormal; Notable for the following components:   Prothrombin Time 25.7 (*)    INR 2.4 (*)    All other components within normal limits  VITAMIN B12 - Abnormal; Notable for the following components:   Vitamin B-12 1,004 (*)    All other components within normal limits  FOLATE - Abnormal; Notable for the following components:   Folate 3.2 (*)    All other components within normal limits  IRON AND TIBC - Abnormal; Notable for the following components:   TIBC 245 (*)    All other components within normal limits  RETICULOCYTES - Abnormal; Notable for the following components:   Retic Ct Pct 3.7 (*)    RBC. 1.87 (*)    Immature Retic Fract 28.9 (*)    All other components within normal limits  BRAIN NATRIURETIC PEPTIDE  FERRITIN  OCCULT BLOOD X 1 CARD TO LAB, STOOL  URINALYSIS, ROUTINE W REFLEX MICROSCOPIC  VON WILLEBRAND PANEL  TYPE AND SCREEN  PREPARE RBC (CROSSMATCH)    Notable for as above, sig anemia  EKG   EKG Interpretation  Date/Time:    Ventricular Rate:    PR Interval:    QRS Duration:   QT Interval:    QTC Calculation:   R Axis:     Text Interpretation:           Imaging Studies ordered: I ordered imaging studies including na    Medicines ordered and prescription drug management: Meds ordered this encounter  Medications   pantoprazole (PROTONIX) injection 40 mg   0.9 %   sodium chloride infusion (Manually program via Guardrails IV Fluids)   potassium chloride SA (KLOR-CON M) CR tablet 40 mEq    -I have reviewed the patients home medicines and have made adjustments as needed   Consultations Obtained: na   Cardiac Monitoring: The patient was maintained on a cardiac monitor.  I personally viewed and interpreted the cardiac monitored which showed an underlying rhythm of: nsr  Social Determinants of Health:  Diagnosis or treatment significantly limited by social determinants of health: alcohol use   Reevaluation: After the interventions noted above, I reevaluated the patient and found that they have improved  Co morbidities that complicate the patient evaluation  Past Medical History:  Diagnosis Date   Liver cirrhosis, alcoholic (HCC)    Portal hypertension (HCC)    Splenomegaly, congestive, chronic       Dispostion: Disposition decision including need for hospitalization was considered, and patient admitted to the hospital.    Final Clinical Impression(s) / ED Diagnoses Final diagnoses:  Anemia, unspecified type  Alcohol abuse  Hypokalemia  Hypocalcemia     This chart was dictated using voice recognition software.  Despite best efforts to proofread,  errors can occur which can change the documentation meaning.    Sloan Leiter, DO 10/25/22 1436

## 2022-10-25 NOTE — ED Triage Notes (Signed)
Patient reports HMG 5.1 drawn yesterday, reports he feels okay, he is no longer drinking. No blood in stool, no emesis. No weakness or dizziness.

## 2022-10-25 NOTE — TOC Progression Note (Signed)
Transition of Care [facility]) - Progression Note    Patient Details  Name: [name] MRN: [id number] Date of Birth: [birth year]  Transition of Care [facility]) CM/SW [place], RN Phone Number:[phone]  10/25/2022, 9:12 PM  Clinical Narrative:    TOC acknowledges admission and will follow . T may be to early to identify TOC needs. TOC following.   Transition of Care [facility]) Screening Note   Patient Details  Name: [name] Date of Birth: 11/05/1985   Transition of Care [facility]) CM/SW Contact:    Angelita Ingles, RN Phone Number: 10/25/2022, 9:12 PM    Transition of Care Department [facility]) has reviewed patient and no TOC needs have been identified at this time. We will continue to monitor patient advancement through interdisciplinary progression rounds.         Expected Discharge Plan and Services                                               Social Determinants of Health (SDOH) Interventions SDOH Screenings   Food Insecurity: No Food Insecurity (10/25/2022)  Housing: Low Risk  (10/25/2022)  Transportation Needs: No Transportation Needs (10/25/2022)  Utilities: Not At Risk (10/25/2022)  Depression (PHQ2-9): Low Risk  (05/21/2021)  Tobacco Use: Low Risk  (10/25/2022)    Readmission Risk Interventions     No data to display

## 2022-10-26 ENCOUNTER — Observation Stay (HOSPITAL_COMMUNITY): Payer: Medicaid Other

## 2022-10-26 ENCOUNTER — Other Ambulatory Visit (HOSPITAL_COMMUNITY): Payer: Self-pay

## 2022-10-26 DIAGNOSIS — D649 Anemia, unspecified: Secondary | ICD-10-CM | POA: Diagnosis not present

## 2022-10-26 LAB — CBC
HCT: 19.3 % — ABNORMAL LOW (ref 39.0–52.0)
Hemoglobin: 6.1 g/dL — CL (ref 13.0–17.0)
MCH: 24.5 pg — ABNORMAL LOW (ref 26.0–34.0)
MCHC: 31.6 g/dL (ref 30.0–36.0)
MCV: 77.5 fL — ABNORMAL LOW (ref 80.0–100.0)
Platelets: 120 10*3/uL — ABNORMAL LOW (ref 150–400)
RBC: 2.49 MIL/uL — ABNORMAL LOW (ref 4.22–5.81)
RDW: 22 % — ABNORMAL HIGH (ref 11.5–15.5)
WBC: 4.9 10*3/uL (ref 4.0–10.5)
nRBC: 1.6 % — ABNORMAL HIGH (ref 0.0–0.2)

## 2022-10-26 LAB — COMPREHENSIVE METABOLIC PANEL
ALT: 24 U/L (ref 0–44)
AST: 90 U/L — ABNORMAL HIGH (ref 15–41)
Albumin: 2 g/dL — ABNORMAL LOW (ref 3.5–5.0)
Alkaline Phosphatase: 84 U/L (ref 38–126)
Anion gap: 8 (ref 5–15)
BUN: 6 mg/dL (ref 6–20)
CO2: 25 mmol/L (ref 22–32)
Calcium: 7.6 mg/dL — ABNORMAL LOW (ref 8.9–10.3)
Chloride: 99 mmol/L (ref 98–111)
Creatinine, Ser: 0.58 mg/dL — ABNORMAL LOW (ref 0.61–1.24)
GFR, Estimated: >60 mL/min (ref >60)
Glucose, Bld: 84 mg/dL (ref 70–99)
Potassium: 3.2 mmol/L — ABNORMAL LOW (ref 3.5–5.1)
Sodium: 132 mmol/L — ABNORMAL LOW (ref 135–145)
Total Bilirubin: 10.6 mg/dL — ABNORMAL HIGH (ref 0.3–1.2)
Total Protein: 7.7 g/dL (ref 6.5–8.1)

## 2022-10-26 LAB — PREPARE RBC (CROSSMATCH)

## 2022-10-26 MED ORDER — SODIUM CHLORIDE 0.9 % IV SOLN: 1.0000 mg | Freq: Once | INTRAVENOUS | Status: DC

## 2022-10-26 MED ORDER — IOHEXOL 350 MG/ML SOLN
125.0000 mL | Freq: Once | INTRAVENOUS | Status: AC | PRN
  Administered 2022-10-26: 125 mL via INTRAVENOUS

## 2022-10-26 MED ORDER — FOLIC ACID 1 MG PO TABS
1.0000 mg | ORAL_TABLET | Freq: Every day | ORAL | Status: DC
  Administered 2022-10-27 – 2022-11-02 (×5): 1 mg via ORAL
  Filled 2022-10-26 (×5): qty 1

## 2022-10-26 MED ORDER — PANTOPRAZOLE SODIUM 40 MG IV SOLR
40.0000 mg | Freq: Two times a day (BID) | INTRAVENOUS | Status: DC
  Administered 2022-10-26 (×2): 40 mg via INTRAVENOUS
  Filled 2022-10-26 (×2): qty 10

## 2022-10-26 MED ORDER — SODIUM CHLORIDE 0.9% IV SOLUTION: Freq: Once | INTRAVENOUS | Status: AC

## 2022-10-26 MED ORDER — FOLIC ACID 5 MG/ML IJ SOLN
1.0000 mg | Freq: Once | INTRAMUSCULAR | Status: AC
  Administered 2022-10-26: 1 mg via INTRAVENOUS
  Filled 2022-10-26: qty 0.2

## 2022-10-26 MED ORDER — POTASSIUM CHLORIDE CRYS ER 20 MEQ PO TBCR
40.0000 meq | EXTENDED_RELEASE_TABLET | Freq: Once | ORAL | Status: AC
  Administered 2022-10-26: 40 meq via ORAL
  Filled 2022-10-26: qty 2

## 2022-10-26 NOTE — Consult Note (Signed)
[facility] Gastroenterology Consult  Referring Provider: No ref. provider found Primary Care Physician:  Lilian Coma., MD Primary Gastroenterologist: Althia Forts, was seen by [facility] gastroenterology in hospital in October 2023  Reason for Consultation: Anemia, history esophageal and gastric varices  SUBJECTIVE:   HPI: [name] is a 37 y.o. male with past medical history significant for cirrhosis secondary to alcohol use, anemia.  Cirrhosis is decompensated by esophageal varices, hepatic encephalopathy.  He is currently prescribed Lasix and Aldactone in the outpatient setting.  He also uses lactulose.  He denied prior history of paracentesis.  He noted his last alcohol being on 10/24/2022, 1 pint of vodka.  He presented to [facility] emergency department on 10/25/2022 from PCP for abnormal labs, low hemoglobin.  He denied melena, hematochezia, hematemesis.  Fecal occult blood testing on 10/25/2022 was negative.  Last EGD completed on 04/26/2021 for hematemesis and anemia by Dr. Paulita Fujita with findings of grade 1 varices esophagus, gastric varices in the fundus, mild portal hypertensive gastropathy in the gastric antrum and body, erosions in the gastric body, diffuse moderately congested mucosa in the duodenal bulb and first part of the duodenum.  On presentation, hemoglobin 4.9 (now 6.1 status post 2 units PRBC), WBC 4.9, platelet 120, INR 2.4, sodium 132, potassium 3.2, BUN/creatinine 6/0.58, AST/ALT 90/24, alkaline phosphatase 84, total bilirubin 10.6.  No abdominal imaging completed.  Past Medical History:  Diagnosis Date   Liver cirrhosis, alcoholic ([place])    Portal hypertension (HCC)    Splenomegaly, congestive, chronic    Past Surgical History:  Procedure Laterality Date   COSMETIC SURGERY     ESOPHAGOGASTRODUODENOSCOPY N/A 04/26/2021   Procedure: ESOPHAGOGASTRODUODENOSCOPY (EGD);  Surgeon: Arta Silence, MD;  Location: [facility];  Service: Endoscopy;  Laterality: N/A;   MANDIBLE  FRACTURE SURGERY     Prior to Admission medications   Medication Sig Start Date End Date Taking? Authorizing Provider  furosemide (LASIX) 40 MG tablet Take 1 tablet (40 mg total) by mouth daily. 07/29/22  Yes Mercy Riding, MD  lactulose (CHRONULAC) 10 GM/15ML solution Take 10 g by mouth 2 (two) times daily.   Yes [provider]  spironolactone (ALDACTONE) 50 MG tablet Take 1 tablet (50 mg total) by mouth daily. 07/29/22  Yes Mercy Riding, MD  ferrous sulfate 325 (65 FE) MG tablet Take 1 tablet (325 mg total) by mouth daily with breakfast. Patient not taking: Reported on 10/25/2022 07/30/22 10/25/22  Truddie Hidden, MD  pantoprazole (PROTONIX) 40 MG tablet Take 1 tablet (40 mg total) by mouth daily. Patient not taking: Reported on 10/25/2022 04/28/21 10/25/22  Elvin So, MD   Current Facility-Administered Medications  Medication Dose Route Frequency Provider Last Rate Last Admin   0.9 %  sodium chloride infusion (Manually program via Guardrails IV Fluids)   Intravenous Once Shelly Coss, MD       acetaminophen (TYLENOL) tablet 650 mg  650 mg Oral Q6H PRN Reubin Milan, MD       Or   acetaminophen (TYLENOL) suppository 650 mg  650 mg Rectal Q6H PRN Reubin Milan, MD       ferrous sulfate tablet 325 mg  325 mg Oral Q breakfast Reubin Milan, MD   325 mg at 10/26/22 0823   [START ON 2/62/0355] folic acid (FOLVITE) tablet 1 mg  1 mg Oral Daily Adhikari, Amrit, MD       folic acid injection 1 mg  1 mg Intravenous Once Shelly Coss, MD  furosemide (LASIX) tablet 40 mg  40 mg Oral Daily Bobette Mo, MD   40 mg at 10/26/22 0954   lactulose (CHRONULAC) 10 GM/15ML solution 10 g  10 g Oral BID Bobette Mo, MD   10 g at 10/26/22 0623   LORazepam (ATIVAN) tablet 0-4 mg  0-4 mg Oral Q6H Bobette Mo, MD   2 mg at 10/25/22 2352   Followed by   Melene Muller ON 10/27/2022] LORazepam (ATIVAN) tablet 0-4 mg  0-4 mg Oral Q12H Bobette Mo, MD       LORazepam (ATIVAN) tablet 1-4 mg  1-4 mg Oral Q1H PRN Bobette Mo, MD       multivitamin with minerals tablet 1 tablet  1 tablet Oral Daily Bobette Mo, MD   1 tablet at 10/26/22 0954   ondansetron [facility]) tablet 4 mg  4 mg Oral Q6H PRN Bobette Mo, MD       Or   ondansetron [facility]) injection 4 mg  4 mg Intravenous Q6H PRN Bobette Mo, MD   4 mg at 10/25/22 1811   pantoprazole (PROTONIX) injection 40 mg  40 mg Intravenous Q12H Burnadette Pop, MD   40 mg at 10/26/22 0954   potassium chloride SA (KLOR-CON M) CR tablet 40 mEq  40 mEq Oral Once Burnadette Pop, MD       spironolactone (ALDACTONE) tablet 50 mg  50 mg Oral Daily Bobette Mo, MD   50 mg at 10/26/22 7628   thiamine (VITAMIN B1) tablet 100 mg  100 mg Oral Daily Bobette Mo, MD   100 mg at 10/26/22 3151   Or   thiamine (VITAMIN B1) injection 100 mg  100 mg Intravenous Daily Bobette Mo, MD       Allergies as of 10/25/2022   (No Known Allergies)   History reviewed. No pertinent family history. 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  Tobacco Use   Smoking status: Never   Smokeless tobacco: Never  Vaping Use   Vaping Use: Never used  Substance and Sexual Activity   Alcohol use: Yes    Comment: pt reports one pint of vodka when he drinks   Drug use: No   Sexual activity: Not on file  Other Topics Concern   Not on file  Social History Narrative   Not on file   Social Determinants of Health   Financial Resource Strain: Not on file  Food Insecurity: No Food Insecurity (10/25/2022)   Hunger Vital Sign    Worried About Running Out of Food in the Last Year: Never true    Ran Out of Food in the Last Year: Never true  Transportation Needs: No  Transportation Needs (10/25/2022)   PRAPARE - Administrator, Civil Service (Medical): No    Lack of Transportation (Non-Medical): No  Physical Activity: Not on file  Stress: Not on file  Social Connections: Not on file  Intimate Partner Violence: Not At Risk (10/25/2022)   Humiliation, Afraid, Rape, and Kick questionnaire    Fear of Current or Ex-Partner: No    Emotionally Abused: No    Physically Abused: No    Sexually Abused: No   Review of Systems:  Review of Systems  Respiratory:  Negative for shortness of breath.   Cardiovascular:  Negative for chest pain.  Gastrointestinal:  Negative for abdominal pain, blood in stool, constipation, diarrhea, melena, nausea and vomiting.    OBJECTIVE:   Temp:  [98 F (36.7 C)-99.1 F (37.3 C)] 99.1 F (37.3 C) (01/27 0511) Pulse Rate:  [81-107] 91 (01/27 0511) Resp:  [16-20] 20 (01/27 0511) BP: (115-146)/(62-88) 136/81 (01/27 0511) SpO2:  [98 %-100 %] 100 % (01/27 0511) Weight:  [95 kg-96.1 kg] 96.1 kg (01/27 0511)   Physical Exam Constitutional:      General: He is not in acute distress.    Appearance: He is not ill-appearing, toxic-appearing or diaphoretic.  Eyes:     General: Scleral icterus present.  Cardiovascular:     Rate and Rhythm: Normal rate and regular rhythm.  Pulmonary:     Effort: No respiratory distress.     Breath sounds: Normal breath sounds.  Abdominal:     General: Bowel sounds are normal. There is no distension.     Palpations: Abdomen is soft.     Tenderness: There is no abdominal tenderness. There is no guarding.  Musculoskeletal:        General: Signs of injury (Left lower extremity wound on lower lateral calf) present.     Right lower leg: No edema.     Left lower leg: No edema.  Neurological:     Mental Status: He is alert.     Labs: Recent Labs    10/25/22 1135 10/25/22 2110 10/26/22 0733  WBC 7.2  --  4.9  HGB 4.9* 5.7* 6.1*  HCT 16.8* 19.4* 19.3*  PLT 136*  --  120*    BMET Recent Labs    10/25/22 1135 10/26/22 0733  NA 137 132*  K 2.9* 3.2*  CL 100 99  CO2 26 25  GLUCOSE 122* 84  BUN 6 6  CREATININE 0.59* 0.58*  CALCIUM 7.5* 7.6*   LFT Recent Labs    10/26/22 0733  PROT 7.7  ALBUMIN 2.0*  AST 90*  ALT 24  ALKPHOS 84  BILITOT 10.6*   PT/INR Recent Labs    10/25/22 1135  LABPROT 25.7*  INR 2.4*    Diagnostic imaging: No results found.  IMPRESSION: Microcytic anemia  -Iron studies largely unremarkable Cirrhosis secondary to alcohol decompensated by varices, hepatic encephalopathy  -Continued alcohol use, last alcohol use being 10/24/2022 -History esophageal varices and gastric varices on EGD 2022  -Abdominal ultrasound completed on 07/26/2022 with nodular contour of liver, gallbladder wall thickening without evidence for cholelithiasis Transaminase elevation and mixed pattern Thrombocytopenia  PLAN: -Trend H/H, transfuse for hemoglobin less than 7, no signs of active GI blood loss at this time -Last EGD with findings of esophageal and gastric varices -To further evaluate esophageal and gastric varices, check CT angiogram with IV contrast -Gastric varices would need to be treated/evaluated prior to any esophageal varix banding (he is due for repeat esophageal varices surveillance with EGD, though given lack of active signs of bleeding, this can be completed in the outpatient setting) -Recommend consultation of interventional radiology to comment on gastric varices and potential treatment -Continue diuretic therapy -Continue lactulose therapy -Recommend low-sodium diet -Strict alcohol cessation -Discontinue IV PPI, okay for oral PPI therapy -[facility] will follow   LOS: 0 days   Danton Clap, DO [facility] Gastroenterology

## 2022-10-26 NOTE — Progress Notes (Signed)
Dr Beverly Sessions DM, examined patient at bedside, no acute findings, only that gums are swollen, gingivitis and in need of cleaning, no teeth that need to be removed. Asked that patient follow up as outpatient with dentist for cleaning.

## 2022-10-26 NOTE — Progress Notes (Signed)
PROGRESS NOTE  Rocio Wolak  GUY:[id number] DOB: [birth year] DOA: 10/25/2022 PCP: Lilian Coma., MD   Brief Narrative: Patient is a 37 year old male with history of alcoholic liver cirrhosis, portal hypertension, variceal bleeding, congestive  splenomegaly, elevated AFP, current alcohol use who presents to the emergency department after his PCP sent him for further evaluation of low hemoglobin.  No l history of abdominal pain, hematochezia or melena.  Last alcohol intake was 2 days ago.  On presentation ,he was hemodynamically stable.  Labs with a hemoglobin of 4.9.  FOBT negative.  Labs showed folic acid level of 3.2.  Patient was transfused with 2  units  of PRBC. Patient was admitted for the same in October last year during which oncology and GI were consulted.  He  has history of GI bleed, esophageal varices.  At that time no EGD or colonoscopy was offered by GI and recommended outpatient follow-up. GI consulted today to rule out variceal bleed.  Assessment & Plan:  Principal Problem:   Symptomatic anemia Active Problems:   Essential hypertension   Alcoholic cirrhosis of liver without ascites (HCC)   Iron deficiency anemia   GERD (gastroesophageal reflux disease)   Hyperbilirubinemia   Thrombocytopenia (HCC)   Class 1 obesity   Hypokalemia   Prediabetes   Alcohol intoxication in active alcoholic without complication (HCC)  Severe anemia: Baseline hemoglobin in the range of 7.  Presented with hemoglobin in the range of 4.  No report of melena or hematochezia, FOBT-negative. Given history of alcoholic liver disease, esophageal varices there is a good chance that he is having upper GI bleed.  We consulted GI. He was also admitted on July 2022 and at that time variceal bleeding was suspected but GI recommended medical management, IR was consulted for TIPS but not done because hemoglobin remained stable Continue to monitor H&H.  Continue IV Protonix. Hemoglobin is still less than 6  after 2 units of blood transfusion, ordered additional 2 units of blood. He complains of bleeding from the right lower gums/teeth but unlikely to cause of this severe anemia from that.   Folic acid deficiency: Secondary to chronic alcohol abuse.  Continue supplementation  Alcoholic liver disease/concurrent alcohol use: Counseled for cessation.  Continue CIWA protocol.  Continue multivitamins.  TOC consulted Patient has chronic thrombocytopenia.  Has mild elevated LFT  Hypertension/portal hypertension: On Lasix, spironolactone.  No significant ascites on presentation  Bleeding from the right lower gum/teeth: Active bleeding from this area.  Needs to see dentist.  He was planning to see next week.I called and discussed with dentist Dr Haig Prophet, who will try to see the patient in the hospital  Hypokalemia: Supplemented and being monitored.  GERD: PPI  Obesity: BMI of 31.2         DVT prophylaxis:SCDs Start: 10/25/22 1519     Code Status: Full Code  Family Communication: None at bedside  Patient status:Inpatient  Patient is from :Home  Anticipated discharge DG:LOVF  Estimated DC date:1-2 days   Consultants: GI,dentistry  Procedures:None  Antimicrobials:  Anti-infectives (From admission, onward)    None       Subjective: Patient seen and examined at bedside today.  Medically stable.  Comfortable.  He was still bleeding from his right lower gum/teeth.  Denies any abdominal pain, no hematochezia or melena.  Objective: Vitals:   10/25/22 2256 10/26/22 0031 10/26/22 0155 10/26/22 0511  BP: 139/77 (!) 142/77 138/71 136/81  Pulse: 100 97 96 91  Resp: 19 20  20  Temp: 98.6 F (37 C) 98.5 F (36.9 C) 98 F (36.7 C) 99.1 F (37.3 C)  TempSrc: Oral Oral Oral Oral  SpO2: 100% 100% 99% 100%  Weight:    96.1 kg  Height:        Intake/Output Summary (Last 24 hours) at 10/26/2022 0757 Last data filed at 10/26/2022 0152 Gross per 24 hour  Intake 1065 ml  Output --   Net 1065 ml   Filed Weights   10/25/22 1113 10/25/22 1509 10/26/22 0511  Weight: 94.8 kg 95 kg 96.1 kg    Examination:  General exam: Overall comfortable, not in distress,obese HEENT: PERRL.  Bleeding from the right lower gums/teeth Respiratory system:  no wheezes or crackles  Cardiovascular system: S1 & S2 heard, RRR.  Gastrointestinal system: Abdomen is mildly distended, soft and nontender. Central nervous system: Alert and oriented Extremities: No edema, no clubbing ,no cyanosis Skin: No rashes, no ulcers,no icterus     Data Reviewed: I have personally reviewed following labs and imaging studies  CBC: Recent Labs  Lab 10/25/22 1135 10/25/22 2110  WBC 7.2  --   NEUTROABS 5.2  --   HGB 4.9* 5.7*  HCT 16.8* 19.4*  MCV 77.1*  --   PLT 136*  --    Basic Metabolic Panel: Recent Labs  Lab 10/25/22 1135 10/25/22 2110  NA 137  --   K 2.9*  --   CL 100  --   CO2 26  --   GLUCOSE 122*  --   BUN 6  --   CREATININE 0.59*  --   CALCIUM 7.5*  --   MG  --  1.9  PHOS  --  3.2     No results found for this or any previous visit (from the past 240 hour(s)).   Radiology Studies: No results found.  Scheduled Meds:  ferrous sulfate  325 mg Oral Q breakfast   folic acid  1 mg Oral Daily   furosemide  40 mg Oral Daily   lactulose  10 g Oral BID   LORazepam  0-4 mg Oral Q6H   Followed by   Derrill Memo ON 10/27/2022] LORazepam  0-4 mg Oral Q12H   multivitamin with minerals  1 tablet Oral Daily   pantoprazole  40 mg Oral Daily   spironolactone  50 mg Oral Daily   thiamine  100 mg Oral Daily   Or   thiamine  100 mg Intravenous Daily   Continuous Infusions:   LOS: 0 days   Shelly Coss, MD [facility] P1/27/2024, 7:57 AM

## 2022-10-27 DIAGNOSIS — I864 Gastric varices: Secondary | ICD-10-CM | POA: Diagnosis present

## 2022-10-27 DIAGNOSIS — I868 Varicose veins of other specified sites: Secondary | ICD-10-CM | POA: Diagnosis present

## 2022-10-27 DIAGNOSIS — R5381 Other malaise: Secondary | ICD-10-CM | POA: Diagnosis present

## 2022-10-27 DIAGNOSIS — I8510 Secondary esophageal varices without bleeding: Secondary | ICD-10-CM | POA: Diagnosis present

## 2022-10-27 DIAGNOSIS — S91002A Unspecified open wound, left ankle, initial encounter: Secondary | ICD-10-CM | POA: Diagnosis present

## 2022-10-27 DIAGNOSIS — K7682 Hepatic encephalopathy: Secondary | ICD-10-CM | POA: Diagnosis not present

## 2022-10-27 DIAGNOSIS — E538 Deficiency of other specified B group vitamins: Secondary | ICD-10-CM | POA: Diagnosis present

## 2022-10-27 DIAGNOSIS — D649 Anemia, unspecified: Secondary | ICD-10-CM | POA: Diagnosis not present

## 2022-10-27 DIAGNOSIS — M7989 Other specified soft tissue disorders: Secondary | ICD-10-CM | POA: Diagnosis not present

## 2022-10-27 DIAGNOSIS — I10 Essential (primary) hypertension: Secondary | ICD-10-CM | POA: Diagnosis present

## 2022-10-27 DIAGNOSIS — R600 Localized edema: Secondary | ICD-10-CM | POA: Diagnosis present

## 2022-10-27 DIAGNOSIS — K920 Hematemesis: Secondary | ICD-10-CM | POA: Diagnosis not present

## 2022-10-27 DIAGNOSIS — D696 Thrombocytopenia, unspecified: Secondary | ICD-10-CM | POA: Diagnosis present

## 2022-10-27 DIAGNOSIS — D509 Iron deficiency anemia, unspecified: Secondary | ICD-10-CM | POA: Diagnosis not present

## 2022-10-27 DIAGNOSIS — F10939 Alcohol use, unspecified with withdrawal, unspecified: Secondary | ICD-10-CM | POA: Diagnosis present

## 2022-10-27 DIAGNOSIS — D500 Iron deficiency anemia secondary to blood loss (chronic): Secondary | ICD-10-CM | POA: Diagnosis present

## 2022-10-27 DIAGNOSIS — K7030 Alcoholic cirrhosis of liver without ascites: Secondary | ICD-10-CM | POA: Diagnosis present

## 2022-10-27 DIAGNOSIS — F10239 Alcohol dependence with withdrawal, unspecified: Secondary | ICD-10-CM | POA: Diagnosis not present

## 2022-10-27 DIAGNOSIS — K3189 Other diseases of stomach and duodenum: Secondary | ICD-10-CM | POA: Diagnosis present

## 2022-10-27 DIAGNOSIS — F10220 Alcohol dependence with intoxication, uncomplicated: Secondary | ICD-10-CM | POA: Diagnosis present

## 2022-10-27 DIAGNOSIS — E871 Hypo-osmolality and hyponatremia: Secondary | ICD-10-CM | POA: Diagnosis not present

## 2022-10-27 DIAGNOSIS — K766 Portal hypertension: Secondary | ICD-10-CM | POA: Diagnosis present

## 2022-10-27 DIAGNOSIS — F10930 Alcohol use, unspecified with withdrawal, uncomplicated: Secondary | ICD-10-CM | POA: Diagnosis not present

## 2022-10-27 DIAGNOSIS — R040 Epistaxis: Secondary | ICD-10-CM | POA: Diagnosis not present

## 2022-10-27 DIAGNOSIS — Y906 Blood alcohol level of 120-199 mg/100 ml: Secondary | ICD-10-CM | POA: Diagnosis present

## 2022-10-27 DIAGNOSIS — K0510 Chronic gingivitis, plaque induced: Secondary | ICD-10-CM | POA: Diagnosis present

## 2022-10-27 DIAGNOSIS — E669 Obesity, unspecified: Secondary | ICD-10-CM | POA: Diagnosis present

## 2022-10-27 DIAGNOSIS — R443 Hallucinations, unspecified: Secondary | ICD-10-CM | POA: Diagnosis not present

## 2022-10-27 DIAGNOSIS — K7460 Unspecified cirrhosis of liver: Secondary | ICD-10-CM | POA: Diagnosis not present

## 2022-10-27 LAB — BASIC METABOLIC PANEL
Anion gap: 7 (ref 5–15)
BUN: 7 mg/dL (ref 6–20)
CO2: 24 mmol/L (ref 22–32)
Calcium: 7.8 mg/dL — ABNORMAL LOW (ref 8.9–10.3)
Chloride: 101 mmol/L (ref 98–111)
Creatinine, Ser: 0.67 mg/dL (ref 0.61–1.24)
GFR, Estimated: >60 mL/min (ref >60)
Glucose, Bld: 93 mg/dL (ref 70–99)
Potassium: 3.2 mmol/L — ABNORMAL LOW (ref 3.5–5.1)
Sodium: 132 mmol/L — ABNORMAL LOW (ref 135–145)

## 2022-10-27 LAB — CBC
HCT: 23.3 % — ABNORMAL LOW (ref 39.0–52.0)
Hemoglobin: 7.2 g/dL — ABNORMAL LOW (ref 13.0–17.0)
MCH: 24.5 pg — ABNORMAL LOW (ref 26.0–34.0)
MCHC: 30.9 g/dL (ref 30.0–36.0)
MCV: 79.3 fL — ABNORMAL LOW (ref 80.0–100.0)
Platelets: 122 10*3/uL — ABNORMAL LOW (ref 150–400)
RBC: 2.94 MIL/uL — ABNORMAL LOW (ref 4.22–5.81)
RDW: 21.5 % — ABNORMAL HIGH (ref 11.5–15.5)
WBC: 5.9 10*3/uL (ref 4.0–10.5)
nRBC: 2.2 % — ABNORMAL HIGH (ref 0.0–0.2)

## 2022-10-27 LAB — AMMONIA: Ammonia: 70 umol/L — ABNORMAL HIGH (ref 9–35)

## 2022-10-27 LAB — HEMOGLOBIN AND HEMATOCRIT, BLOOD
HCT: 23.8 % — ABNORMAL LOW (ref 39.0–52.0)
Hemoglobin: 7.3 g/dL — ABNORMAL LOW (ref 13.0–17.0)

## 2022-10-27 MED ORDER — HYDROXYZINE HCL 25 MG PO TABS
25.0000 mg | ORAL_TABLET | Freq: Four times a day (QID) | ORAL | Status: AC | PRN
  Administered 2022-10-27 – 2022-10-29 (×3): 25 mg via ORAL
  Filled 2022-10-27 (×3): qty 1

## 2022-10-27 MED ORDER — THIAMINE HCL 100 MG/ML IJ SOLN: 100.0000 mg | Freq: Once | INTRAMUSCULAR | Status: DC

## 2022-10-27 MED ORDER — POTASSIUM CHLORIDE CRYS ER 20 MEQ PO TBCR
30.0000 meq | EXTENDED_RELEASE_TABLET | Freq: Once | ORAL | Status: AC
  Administered 2022-10-27: 30 meq via ORAL
  Filled 2022-10-27: qty 1

## 2022-10-27 MED ORDER — ONDANSETRON 4 MG PO TBDP: 4.0000 mg | ORAL_TABLET | Freq: Four times a day (QID) | ORAL | Status: AC | PRN

## 2022-10-27 MED ORDER — OLANZAPINE 10 MG IM SOLR
2.5000 mg | Freq: Once | INTRAMUSCULAR | Status: DC
  Filled 2022-10-27: qty 10

## 2022-10-27 MED ORDER — STERILE WATER FOR INJECTION IJ SOLN
INTRAMUSCULAR | Status: AC
  Filled 2022-10-27: qty 10

## 2022-10-27 MED ORDER — POTASSIUM CHLORIDE CRYS ER 20 MEQ PO TBCR
40.0000 meq | EXTENDED_RELEASE_TABLET | Freq: Once | ORAL | Status: AC
  Administered 2022-10-27: 40 meq via ORAL
  Filled 2022-10-27: qty 2

## 2022-10-27 MED ORDER — LORAZEPAM 2 MG/ML IJ SOLN
1.0000 mg | Freq: Once | INTRAMUSCULAR | Status: AC | PRN
  Administered 2022-10-27: 4 mg via INTRAVENOUS
  Filled 2022-10-27: qty 2

## 2022-10-27 MED ORDER — LACTULOSE 10 GM/15ML PO SOLN
30.0000 g | Freq: Three times a day (TID) | ORAL | Status: DC
  Administered 2022-10-27 – 2022-11-01 (×13): 30 g via ORAL
  Filled 2022-10-27 (×13): qty 60
  Filled 2022-10-27: qty 45

## 2022-10-27 MED ORDER — LORAZEPAM 2 MG/ML IJ SOLN
1.0000 mg | Freq: Once | INTRAMUSCULAR | Status: AC | PRN
  Administered 2022-10-27: 3 mg via INTRAVENOUS
  Filled 2022-10-27: qty 2

## 2022-10-27 MED ORDER — LOPERAMIDE HCL 2 MG PO CAPS: 2.0000 mg | ORAL_CAPSULE | ORAL | Status: DC | PRN

## 2022-10-27 MED ORDER — ADULT MULTIVITAMIN W/MINERALS CH: 1.0000 | ORAL_TABLET | Freq: Every day | ORAL | Status: DC

## 2022-10-27 MED ORDER — CHLORDIAZEPOXIDE HCL 25 MG PO CAPS: 25.0000 mg | ORAL_CAPSULE | Freq: Four times a day (QID) | ORAL | Status: DC | PRN

## 2022-10-27 MED ORDER — OLANZAPINE 10 MG IM SOLR
2.5000 mg | Freq: Once | INTRAMUSCULAR | Status: AC | PRN
  Administered 2022-10-28: 2.5 mg via INTRAMUSCULAR
  Filled 2022-10-27: qty 10

## 2022-10-27 MED ORDER — PANTOPRAZOLE SODIUM 40 MG PO TBEC
40.0000 mg | DELAYED_RELEASE_TABLET | Freq: Two times a day (BID) | ORAL | Status: DC
  Administered 2022-10-27 – 2022-11-02 (×10): 40 mg via ORAL
  Filled 2022-10-27 (×10): qty 1

## 2022-10-27 NOTE — Progress Notes (Signed)
Interventional Radiology Brief Note:  Patient with history of cirrhosis secondary to alcohol abuse, anemia presented to [facility] ED from his PCP office due to HgB of 4.9.  Patient has previously been scoped by GI and found to have esophageal and gastric varices. He has no report of acute bleeding. His fecal occult testing on admission was negative. He is being transfused.  IR consulted for evaluation and management of his varices.    CTA Abdomen Pelvis obtained yesterday, highlighted findings show: -Evidence of portal hypertension with splenomegaly and large splenorenal portosystemic shunt. -Hepatic cirrhosis. 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 -in addition to the large spleno renal shunt described in the initial report, there is a second large portosystemic shunt in the left lower quadrant consisting of a communication between the SMV and the retroperitoneal venous structures. There are likely some ectopic varices within the fourth portion of the duodenum in the region of the ligament of Treitz.  -Two separate clusters of ectopic duodenal varices, one at the junction of the D2 and D3 segments and one in the distal D4 segment in the region of the ligament of Treitz could represent sources of GI bleeding.  Further consideration and discussion with medical team and patient needed prior to proceeding with any intervention given these findings as well as his ongoing alcohol use. No emergent need to proceed given his lack of acute GI bleeding. Anticipate further review by IR team as well as potential additional imaging/testing including CT BRTO and/or ECHO pending clinical status and disposition.   IR following.   Brynda Greathouse, MS RD PA-C

## 2022-10-27 NOTE — Progress Notes (Signed)
PROGRESS NOTE  [name]  WUJ:[id number] DOB: [birth year] DOA: 10/25/2022 PCP: Lilian Coma., MD   Brief Narrative: Patient is a 37 year old male with history of alcoholic liver cirrhosis, portal hypertension, variceal bleeding, congestive  splenomegaly, elevated AFP, current alcohol use who presents to the emergency department after his PCP sent him for further evaluation of low hemoglobin.  No  history of abdominal pain, hematochezia or melena.  Last alcohol intake was 2 days ago before admission date.  On presentation ,he was hemodynamically stable.  Labs with a hemoglobin of 4.9.  FOBT negative.  Labs showed folic acid level of 3.2.  Patient was transfused with 4  units  of PRBC. Patient was admitted for the same in October last year during which oncology and GI were consulted.  He  has history of GI bleed, esophageal varices.  At that time no EGD or colonoscopy was offered by GI and recommended outpatient follow-up. GI consulted  to rule out variceal bleed.  Hospital course  remarkable for persistent alcohol withdrawal symptoms.  On CIWA monitoring  Assessment & Plan:  Principal Problem:   Symptomatic anemia Active Problems:   Essential hypertension   Alcoholic cirrhosis of liver without ascites (HCC)   Iron deficiency anemia   GERD (gastroesophageal reflux disease)   Hyperbilirubinemia   Thrombocytopenia (HCC)   Class 1 obesity   Hypokalemia   Prediabetes   Alcohol intoxication in active alcoholic without complication (HCC)   Alcohol withdrawal (HCC)  Severe anemia: Baseline hemoglobin in the range of 7.  Presented with hemoglobin in the range of 4.  No report of melena or hematochezia, FOBT-negative.  Status post transfusion with 4 units of PRBC.  Currently hemoglobin stable in the range of 7 Continue monitor H&H  Alcoholic liver disease/concurrent alcohol use/alcohol withdrawal: Counseled for cessation.  Continue CIWA protocol.  Continue multivitamins.  TOC consulted.Patient  has chronic thrombocytopenia.  Has mildly elevated LFT. Remains confused, hallucinating today.  Will transfer him to progressive.Checking ammonia  Alcoholic liver  disease/gastric/esophageal varices: GI consulted Given history of alcoholic liver disease, esophageal varices there is a good chance that he is having upper GI bleed.   He was also admitted on July 2022 and at that time variceal bleeding was suspected but GI recommended medical management, IR was consulted for TIPS but not done because hemoglobin remained stable. As per GI recommendation, we have consulted interventional radiology for consideration of TIPS/checking gastric varices. CT angiogram showed duodenal varices, portosystemic shunt, splenorenal shunt  Gingivitis: He complained of bleeding from the right lower gums/teeth but unlikely to cause of this severe anemia from that.  Dr. Haig Prophet, dentistry, saw the patient in house.  Recommend outpatient follow-up  Folic acid deficiency: Secondary to chronic alcohol abuse.  Continue supplementation  Hypertension/portal hypertension: On Lasix, spironolactone.  No significant ascites on presentation  Hypokalemia: Supplemented and being monitored.  GERD: PPI  Obesity: BMI of 31.2         DVT prophylaxis:SCDs Start: 10/25/22 1519     Code Status: Full Code  Family Communication: None at bedside  Patient status:Inpatient  Patient is from :Home  Anticipated discharge NF:AOZH  Estimated DC date:not sure   Consultants: GI,dentistry  Procedures:None  Antimicrobials:  Anti-infectives (From admission, onward)    None       Subjective: Patient seen and examined at bedside today.  He looks confused.  He was wandering  inside the room and saying that his girlfriend was sitting there but there was nobody in the room.  He is not agitated.  Calm.  Follows commands but looks like he is hallucinating  Objective: Vitals:   10/26/22 2329 10/27/22 0243 10/27/22 0449  10/27/22 0718  BP: (!) 154/85 138/69 (!) 140/82 (!) 151/83  Pulse: 88 97 98 (!) 109  Resp: 14  16   Temp: 98.8 F (37.1 C)  98.4 F (36.9 C)   TempSrc: Oral  Oral   SpO2: 98%  98%   Weight:      Height:        Intake/Output Summary (Last 24 hours) at 10/27/2022 1113 Last data filed at 10/26/2022 2330 Gross per 24 hour  Intake 1517 ml  Output --  Net 1517 ml   Filed Weights   10/25/22 1113 10/25/22 1509 10/26/22 0511  Weight: 94.8 kg 95 kg 96.1 kg    Examination:   General exam: Overall comfortable, not in distress, obese, confused HEENT: PERRL, dental caries Respiratory system:  no wheezes or crackles  Cardiovascular system: S1 & S2 heard, RRR.  Gastrointestinal system: Abdomen is nondistended, soft and nontender. Central nervous system: Alert and awake but not oriented, follows commands Extremities: No edema, no clubbing ,no cyanosis Skin: No rashes, no ulcers,no icterus     Data Reviewed: I have personally reviewed following labs and imaging studies  CBC: Recent Labs  Lab 10/25/22 1135 10/25/22 2110 10/26/22 0733 10/27/22 0134 10/27/22 0135  WBC 7.2  --  4.9 5.9  --   NEUTROABS 5.2  --   --   --   --   HGB 4.9* 5.7* 6.1* 7.2* 7.3*  HCT 16.8* 19.4* 19.3* 23.3* 23.8*  MCV 77.1*  --  77.5* 79.3*  --   PLT 136*  --  120* 122*  --    Basic Metabolic Panel: Recent Labs  Lab 10/25/22 1135 10/25/22 2110 10/26/22 0733 10/27/22 0153  NA 137  --  132* 132*  K 2.9*  --  3.2* 3.2*  CL 100  --  99 101  CO2 26  --  25 24  GLUCOSE 122*  --  84 93  BUN 6  --  6 7  CREATININE 0.59*  --  0.58* 0.67  CALCIUM 7.5*  --  7.6* 7.8*  MG  --  1.9  --   --   PHOS  --  3.2  --   --      No results found for this or any previous visit (from the past 240 hour(s)).   Radiology Studies: CT Angio Chest/Abd/Pel for Dissection W and/or W/WO  Addendum Date: 10/27/2022   ADDENDUM REPORT: 10/27/2022 09:30 ADDENDUM: Additional review of imaging identifies a small cluster of  ectopic varices in the duodenum at the transition from the D2 to D3 segments. Also, in addition to the large spleno renal shunt described in the initial report, there is a second large portosystemic shunt in the left lower quadrant consisting of a communication between the SMV and the retroperitoneal venous structures. There are likely some ectopic varices within the fourth portion of the duodenum in the region of the ligament of Treitz. Two separate clusters of ectopic duodenal varices, one at the junction of the D2 and D3 segments and one in the distal D4 segment in the region of the ligament of Treitz could represent sources of GI bleeding. These results will be called to the ordering clinician or representative by the Radiologist Assistant, and communication documented in the PACS or [employer]. Electronically Signed   By: Dellis Filbert.D.  On: 10/27/2022 09:30   Result Date: 10/27/2022 CLINICAL DATA:  37 year old male with a history of cirrhosis, portal hypertension and varices. EXAM: CT ANGIOGRAPHY CHEST, ABDOMEN AND PELVIS TECHNIQUE: Non-contrast CT of the chest was initially obtained. Multidetector CT imaging through the chest, abdomen and pelvis was performed using the standard protocol during bolus administration of intravenous contrast. Multiplanar reconstructed images and MIPs were obtained and reviewed to evaluate the vascular anatomy. RADIATION DOSE REDUCTION: This exam was performed according to the departmental dose-optimization program which includes automated exposure control, adjustment of the mA and/or kV according to patient size and/or use of iterative reconstruction technique. CONTRAST:  OMNIPAQUE IOHEXOL 350 MG/ML SOLN COMPARISON:  Prior CT scan of the chest, abdomen and pelvis 01/13/2022 FINDINGS: CTA CHEST FINDINGS Cardiovascular: Significant cardiomegaly, advanced for age. Conventional 3 vessel arch anatomy. No evidence of aneurysm or dissection. Mediastinum/Nodes:  Unremarkable CT appearance of the thyroid gland. No suspicious mediastinal or hilar adenopathy. No soft tissue mediastinal mass. The thoracic esophagus is unremarkable given limitations of arterial phase study. Lungs/Pleura: Lungs are clear. No pleural effusion or pneumothorax. Musculoskeletal: No chest wall abnormality. No acute or significant osseous findings. Review of the MIP images confirms the above findings. CTA ABDOMEN AND PELVIS FINDINGS VASCULAR Aorta: Normal caliber aorta without aneurysm, dissection, vasculitis or significant stenosis. Celiac: Patent without evidence of aneurysm, dissection, vasculitis or significant stenosis. SMA: Patent without evidence of aneurysm, dissection, vasculitis or significant stenosis. Renals: Both renal arteries are patent without evidence of aneurysm, dissection, vasculitis, fibromuscular dysplasia or significant stenosis. IMA: Patent without evidence of aneurysm, dissection, vasculitis or significant stenosis. Inflow: Patent without evidence of aneurysm, dissection, vasculitis or significant stenosis. Veins: Somewhat limited evaluation given arterial phase timing of the study. The portal veins appear grossly patent. No evidence of gastro renal shunt or gastric varices. No significant esophageal varices are noted. 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 Small recanalized periumbilical vein. Review of the MIP images confirms the above findings. NON-VASCULAR Hepatobiliary: Low-attenuation heterogeneous liver. The morphology is suggestive of cirrhosis. No definite enhancing lesion. Stones layer within the gallbladder lumen. No intra or extrahepatic biliary ductal dilatation. Pancreas: Unremarkable. No pancreatic ductal dilatation or surrounding inflammatory changes. Spleen: Mild splenomegaly. Adrenals/Urinary Tract: Adrenal glands are unremarkable. Kidneys are normal, without renal calculi, focal lesion, or hydronephrosis. Bladder is unremarkable. Stomach/Bowel: Limited evaluation  given arterial phase timing. Small esophageal varices may be present. No definite gastric varices. No focal bowel wall thickening or evidence of obstruction. Lymphatic: No suspicious lymphadenopathy. Reproductive: Prostate is unremarkable. Other: No abdominal wall hernia or abnormality. No abdominopelvic ascites. Musculoskeletal: No acute or significant osseous findings. Review of the MIP images confirms the above findings. IMPRESSION: 1. Somewhat limited evaluation of esophageal and gastric varices given arterial phase timing of this dissection protocol CT arteriogram of the chest abdomen and pelvis. For future reference, evaluation of portal venous anatomy and venous varicosities is best studied with a BRTO (balloon occluded transvenous retrograde obliteration) protocol CT scan of the abdomen and pelvis with contrast. If there is ever a question of which study to order, interventional radiology is available 24/7/365 to answer questions. 2. There may be small esophageal varices although not definitive in the absence of dedicated venous phase imaging. No evidence of gastro renal shunt or gastric varices. 3. Evidence of portal hypertension with splenomegaly and large splenorenal portosystemic shunt. 4. Hepatic cirrhosis. 5. Age advanced cardiomegaly. 6. Cholelithiasis. 7. No focal arterial abnormality. Signed, Sterling Big, MD, RPVI [facility] Radiology Electronically  Signed: By: Jacqulynn Cadet M.D. On: 10/26/2022 15:47    Scheduled Meds:  ferrous sulfate  325 mg Oral Q breakfast   folic acid  1 mg Oral Daily   furosemide  40 mg Oral Daily   lactulose  10 g Oral BID   LORazepam  0-4 mg Oral Q6H   Followed by   LORazepam  0-4 mg Oral Q12H   multivitamin with minerals  1 tablet Oral Daily   pantoprazole  40 mg Oral BID   spironolactone  50 mg Oral Daily   thiamine  100 mg Oral Daily   Or   thiamine  100 mg Intravenous Daily   Continuous  Infusions:   LOS: 0 days   Shelly Coss, MD [facility] P1/28/2024, 11:13 AM

## 2022-10-27 NOTE — Progress Notes (Signed)
[facility] Progress Note  SUBJECTIVE:   Interval history: [name] was seen and evaluated today at bedside.  Family numbers at bedside, mother, father, fianc.  Neysa BonitoFianc notes that he received lactulose therapy after his discharge from hospital in October 2023, used for roughly 2 weeks, then discontinued use.  He appears more confused today, oriented to person and place, not oriented to time.  Falling asleep sitting up. Denied nausea or vomiting.  Denied chest pain or shortness of breath.  Family with questions regarding left lower extremity wound.  Patient noted that he has withdrawn from alcohol and had "shakes", he denied prior alcohol withdrawal seizure.  Interventional radiology consultation noted, appreciate recommendations.  Past Medical History:  Diagnosis Date   Liver cirrhosis, alcoholic (HCC)    Portal hypertension (HCC)    Splenomegaly, congestive, chronic    Past Surgical History:  Procedure Laterality Date   COSMETIC SURGERY     ESOPHAGOGASTRODUODENOSCOPY N/A 04/26/2021   Procedure: ESOPHAGOGASTRODUODENOSCOPY (EGD);  Surgeon: Willis Modenautlaw, William, MD;  Location: [facility];  Service: Endoscopy;  Laterality: N/A;   MANDIBLE FRACTURE SURGERY     Current Facility-Administered Medications  Medication Dose Route Frequency Provider Last Rate Last Admin   acetaminophen (TYLENOL) tablet 650 mg  650 mg Oral Q6H PRN Bobette Mortiz, David Manuel, MD       Or   acetaminophen (TYLENOL) suppository 650 mg  650 mg Rectal Q6H PRN Bobette Mortiz, David Manuel, MD       ferrous sulfate tablet 325 mg  325 mg Oral Q breakfast Bobette Mortiz, David Manuel, MD   325 mg at 10/27/22 [id number]   folic acid (FOLVITE) tablet 1 mg  1 mg Oral Daily Burnadette PopAdhikari, Amrit, MD   1 mg at 10/27/22 1041   furosemide (LASIX) tablet 40 mg  40 mg Oral Daily Bobette Mortiz, David Manuel, MD   40 mg at 10/27/22 1041   hydrOXYzine (ATARAX) tablet 25 mg  25 mg Oral Q6H PRN Burnadette PopAdhikari, Amrit, MD   25 mg at 10/27/22 1041   lactulose (CHRONULAC) 10  GM/15ML solution 30 g  30 g Oral TID Burnadette PopAdhikari, Amrit, MD   30 g at 10/27/22 1505   loperamide (IMODIUM) capsule 2-4 mg  2-4 mg Oral PRN Burnadette PopAdhikari, Amrit, MD       LORazepam (ATIVAN) tablet 0-4 mg  0-4 mg Oral Q6H Bobette Mortiz, David Manuel, MD   1 mg at 10/27/22 1505   Followed by   LORazepam (ATIVAN) tablet 0-4 mg  0-4 mg Oral Q12H Bobette Mortiz, David Manuel, MD       LORazepam (ATIVAN) tablet 1-4 mg  1-4 mg Oral Q1H PRN Bobette Mortiz, David Manuel, MD   1 mg at 10/27/22 0725   multivitamin with minerals tablet 1 tablet  1 tablet Oral Daily Bobette Mortiz, David Manuel, MD   1 tablet at 10/27/22 1041   ondansetron [facility](ZOFRAN) tablet 4 mg  4 mg Oral Q6H PRN Bobette Mortiz, David Manuel, MD       Or   ondansetron [facility](ZOFRAN) injection 4 mg  4 mg Intravenous Q6H PRN Bobette Mortiz, David Manuel, MD   4 mg at 10/25/22 1811   ondansetron (ZOFRAN-ODT) disintegrating tablet 4 mg  4 mg Oral Q6H PRN Burnadette PopAdhikari, Amrit, MD       pantoprazole (PROTONIX) EC tablet 40 mg  40 mg Oral BID Hessie KnowsLegge, Justin M, RPH   40 mg at 10/27/22 1041   spironolactone (ALDACTONE) tablet 50 mg  50 mg Oral Daily Bobette Mortiz, David Manuel, MD   50 mg at 10/27/22 1041  thiamine (VITAMIN B1) tablet 100 mg  100 mg Oral Daily Reubin Milan, MD   100 mg at 10/27/22 1041   Or   thiamine (VITAMIN B1) injection 100 mg  100 mg Intravenous Daily Reubin Milan, MD       Allergies as of 10/25/2022   (No Known Allergies)   Review of Systems:  Review of Systems  Respiratory:  Negative for shortness of breath.   Cardiovascular:  Negative for chest pain.  Gastrointestinal:  Negative for nausea and vomiting.    OBJECTIVE:   Temp:  [98.3 F (36.8 C)-99.5 F (37.5 C)] 98.5 F (36.9 C) (01/28 1407) Pulse Rate:  [79-115] 115 (01/28 1407) Resp:  [14-18] 18 (01/28 1407) BP: (136-154)/(69-88) 136/88 (01/28 1407) SpO2:  [98 %-100 %] 100 % (01/28 1407) Last BM Date : 10/26/22 Physical Exam Constitutional:      General: He is not in acute distress.    Appearance: He is ill-appearing.   Eyes:     General: Scleral icterus present.  Cardiovascular:     Rate and Rhythm: Normal rate and regular rhythm.  Pulmonary:     Effort: No respiratory distress.     Breath sounds: Normal breath sounds.  Abdominal:     General: Bowel sounds are normal. There is no distension.     Palpations: Abdomen is soft.     Tenderness: There is no abdominal tenderness. There is no guarding.  Musculoskeletal:     Right lower leg: No edema.     Left lower leg: No edema.  Skin:    General: Skin is warm and dry.  Neurological:     Mental Status: He is easily aroused. He is lethargic.     Labs: Recent Labs    10/25/22 1135 10/25/22 2110 10/26/22 0733 10/27/22 0134 10/27/22 0135  WBC 7.2  --  4.9 5.9  --   HGB 4.9*   < > 6.1* 7.2* 7.3*  HCT 16.8*   < > 19.3* 23.3* 23.8*  PLT 136*  --  120* 122*  --    < > = values in this interval not displayed.   BMET Recent Labs    10/25/22 1135 10/26/22 0733 10/27/22 0153  NA 137 132* 132*  K 2.9* 3.2* 3.2*  CL 100 99 101  CO2 26 25 24   GLUCOSE 122* 84 93  BUN 6 6 7   CREATININE 0.59* 0.58* 0.67  CALCIUM 7.5* 7.6* 7.8*   LFT Recent Labs    10/26/22 0733  PROT 7.7  ALBUMIN 2.0*  AST 90*  ALT 24  ALKPHOS 84  BILITOT 10.6*   PT/INR Recent Labs    10/25/22 1135  LABPROT 25.7*  INR 2.4*   Diagnostic imaging: CT Angio Chest/Abd/Pel for Dissection W and/or W/WO  Addendum Date: 10/27/2022   ADDENDUM REPORT: 10/27/2022 09:30 ADDENDUM: Additional review of imaging identifies a small cluster of ectopic varices in the duodenum at the transition from the D2 to D3 segments. Also, in addition to the large spleno renal shunt described in the initial report, there is a second large portosystemic shunt in the left lower quadrant consisting of a communication between the SMV and the retroperitoneal venous structures. There are likely some ectopic varices within the fourth portion of the duodenum in the region of the ligament of Treitz. Two  separate clusters of ectopic duodenal varices, one at the junction of the D2 and D3 segments and one in the distal D4 segment in the region of the ligament of  Treitz could represent sources of GI bleeding. These results will be called to the ordering clinician or representative by the Radiologist Assistant, and communication documented in the PACS or [employer]. Electronically Signed   By: Jacqulynn Cadet M.D.   On: 10/27/2022 09:30   Result Date: 10/27/2022 CLINICAL DATA:  37 year old male with a history of cirrhosis, portal hypertension and varices. EXAM: CT ANGIOGRAPHY CHEST, ABDOMEN AND PELVIS TECHNIQUE: Non-contrast CT of the chest was initially obtained. Multidetector CT imaging through the chest, abdomen and pelvis was performed using the standard protocol during bolus administration of intravenous contrast. Multiplanar reconstructed images and MIPs were obtained and reviewed to evaluate the vascular anatomy. RADIATION DOSE REDUCTION: This exam was performed according to the departmental dose-optimization program which includes automated exposure control, adjustment of the mA and/or kV according to patient size and/or use of iterative reconstruction technique. CONTRAST:  153mL OMNIPAQUE IOHEXOL 350 MG/ML SOLN COMPARISON:  Prior CT scan of the chest, abdomen and pelvis 01/13/2022 FINDINGS: CTA CHEST FINDINGS Cardiovascular: Significant cardiomegaly, advanced for age. Conventional 3 vessel arch anatomy. No evidence of aneurysm or dissection. Mediastinum/Nodes: Unremarkable CT appearance of the thyroid gland. No suspicious mediastinal or hilar adenopathy. No soft tissue mediastinal mass. The thoracic esophagus is unremarkable given limitations of arterial phase study. Lungs/Pleura: Lungs are clear. No pleural effusion or pneumothorax. Musculoskeletal: No chest wall abnormality. No acute or significant osseous findings. Review of the MIP images confirms the above findings. CTA ABDOMEN AND PELVIS  FINDINGS VASCULAR Aorta: Normal caliber aorta without aneurysm, dissection, vasculitis or significant stenosis. Celiac: Patent without evidence of aneurysm, dissection, vasculitis or significant stenosis. SMA: Patent without evidence of aneurysm, dissection, vasculitis or significant stenosis. Renals: Both renal arteries are patent without evidence of aneurysm, dissection, vasculitis, fibromuscular dysplasia or significant stenosis. IMA: Patent without evidence of aneurysm, dissection, vasculitis or significant stenosis. Inflow: Patent without evidence of aneurysm, dissection, vasculitis or significant stenosis. Veins: Somewhat limited evaluation given arterial phase timing of the study. The portal veins appear grossly patent. No evidence of gastro renal shunt or gastric varices. No significant esophageal varices are noted. 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 Small recanalized periumbilical vein. Review of the MIP images confirms the above findings. NON-VASCULAR Hepatobiliary: Low-attenuation heterogeneous liver. The morphology is suggestive of cirrhosis. No definite enhancing lesion. Stones layer within the gallbladder lumen. No intra or extrahepatic biliary ductal dilatation. Pancreas: Unremarkable. No pancreatic ductal dilatation or surrounding inflammatory changes. Spleen: Mild splenomegaly. Adrenals/Urinary Tract: Adrenal glands are unremarkable. Kidneys are normal, without renal calculi, focal lesion, or hydronephrosis. Bladder is unremarkable. Stomach/Bowel: Limited evaluation given arterial phase timing. Small esophageal varices may be present. No definite gastric varices. No focal bowel wall thickening or evidence of obstruction. Lymphatic: No suspicious lymphadenopathy. Reproductive: Prostate is unremarkable. Other: No abdominal wall hernia or abnormality. No abdominopelvic ascites. Musculoskeletal: No acute or significant osseous findings. Review of the MIP images confirms the above findings. IMPRESSION:  1. Somewhat limited evaluation of esophageal and gastric varices given arterial phase timing of this dissection protocol CT arteriogram of the chest abdomen and pelvis. For future reference, evaluation of portal venous anatomy and venous varicosities is best studied with a BRTO (balloon occluded transvenous retrograde obliteration) protocol CT scan of the abdomen and pelvis with contrast. If there is ever a question of which study to order, interventional radiology is available 24/7/365 to answer questions. 2. There may be small esophageal varices although not definitive in the absence of dedicated venous phase imaging. No evidence of gastro renal shunt or  gastric varices. 3. Evidence of portal hypertension with splenomegaly and large splenorenal portosystemic shunt. 4. Hepatic cirrhosis. 5. Age advanced cardiomegaly. 6. Cholelithiasis. 7. No focal arterial abnormality. Signed, Sterling Big, MD, RPVI [facility] Radiology Electronically Signed: By: Malachy Moan M.D. On: 10/26/2022 15:47    IMPRESSION: Microcytic anemia, stable             -Iron studies largely unremarkable Cirrhosis secondary to alcohol decompensated by varices, hepatic encephalopathy             -Continued alcohol use, last alcohol use being 10/24/2022 -History esophageal varices and gastric varices on EGD 2022             -Abdominal ultrasound completed on 07/26/2022 with nodular contour of liver, gallbladder wall thickening without evidence for cholelithiasis  -CT angiogram chest and abdomen 10/26/2022 showed small esophageal varices, not definitive, no evidence of gastrorenal shunt or gastric varices Transaminase elevation and mixed pattern Thrombocytopenia  PLAN: -Lactulose 30 g p.o. 3 times daily ordered, agree, monitor stool output -Continue Lasix 40 mg p.o. daily, spironolactone 50 mg p.o. daily -CIWA protocol, multivitamin, thiamine and folic acid -Recommend patient  have outpatient upper endoscopy completed to evaluate esophageal varices and gastric varices -Recommend complete abstinence from alcohol -[facility] will follow   LOS: 0 days   Liliane Shi, [facility]

## 2022-10-28 ENCOUNTER — Inpatient Hospital Stay (HOSPITAL_COMMUNITY): Payer: Medicaid Other

## 2022-10-28 DIAGNOSIS — M7989 Other specified soft tissue disorders: Secondary | ICD-10-CM | POA: Diagnosis not present

## 2022-10-28 DIAGNOSIS — D649 Anemia, unspecified: Secondary | ICD-10-CM | POA: Diagnosis not present

## 2022-10-28 LAB — COMPREHENSIVE METABOLIC PANEL
ALT: 22 U/L (ref 0–44)
AST: 75 U/L — ABNORMAL HIGH (ref 15–41)
Albumin: 1.9 g/dL — ABNORMAL LOW (ref 3.5–5.0)
Alkaline Phosphatase: 75 U/L (ref 38–126)
Anion gap: 9 (ref 5–15)
BUN: 10 mg/dL (ref 6–20)
CO2: 22 mmol/L (ref 22–32)
Calcium: 8.2 mg/dL — ABNORMAL LOW (ref 8.9–10.3)
Chloride: 102 mmol/L (ref 98–111)
Creatinine, Ser: 0.69 mg/dL (ref 0.61–1.24)
GFR, Estimated: >60 mL/min (ref >60)
Glucose, Bld: 82 mg/dL (ref 70–99)
Potassium: 3.5 mmol/L (ref 3.5–5.1)
Sodium: 133 mmol/L — ABNORMAL LOW (ref 135–145)
Total Bilirubin: 11.8 mg/dL — ABNORMAL HIGH (ref 0.3–1.2)
Total Protein: 7.4 g/dL (ref 6.5–8.1)

## 2022-10-28 LAB — BPAM RBC
Blood Product Expiration Date: 202402272359
Blood Product Expiration Date: 202403022359
Blood Product Expiration Date: 202403052359
Blood Product Expiration Date: 202403052359
ISSUE DATE / TIME: 202401261317
ISSUE DATE / TIME: 202401262235
ISSUE DATE / TIME: 202401271534
ISSUE DATE / TIME: 202401272109
Unit Type and Rh: 5100
Unit Type and Rh: 5100
Unit Type and Rh: 5100
Unit Type and Rh: 5100

## 2022-10-28 LAB — TYPE AND SCREEN
ABO/RH(D): O POS
Antibody Screen: NEGATIVE
Unit division: 0
Unit division: 0
Unit division: 0
Unit division: 0

## 2022-10-28 LAB — CBC
HCT: 24.2 % — ABNORMAL LOW (ref 39.0–52.0)
Hemoglobin: 7.3 g/dL — ABNORMAL LOW (ref 13.0–17.0)
MCH: 24.5 pg — ABNORMAL LOW (ref 26.0–34.0)
MCHC: 30.2 g/dL (ref 30.0–36.0)
MCV: 81.2 fL (ref 80.0–100.0)
Platelets: 138 10*3/uL — ABNORMAL LOW (ref 150–400)
RBC: 2.98 MIL/uL — ABNORMAL LOW (ref 4.22–5.81)
RDW: 22.3 % — ABNORMAL HIGH (ref 11.5–15.5)
WBC: 7.1 10*3/uL (ref 4.0–10.5)
nRBC: 0.7 % — ABNORMAL HIGH (ref 0.0–0.2)

## 2022-10-28 LAB — AMMONIA: Ammonia: 61 umol/L — ABNORMAL HIGH (ref 9–35)

## 2022-10-28 MED ORDER — LACTULOSE ENEMA
300.0000 mL | Freq: Once | ORAL | Status: AC
  Administered 2022-10-28: 300 mL via RECTAL
  Filled 2022-10-28: qty 300

## 2022-10-28 MED ORDER — SODIUM CHLORIDE 0.9 % IV SOLN: INTRAVENOUS | Status: DC

## 2022-10-28 MED ORDER — LORAZEPAM 2 MG/ML IJ SOLN
1.0000 mg | Freq: Once | INTRAMUSCULAR | Status: AC | PRN
  Administered 2022-10-28: 2 mg via INTRAVENOUS
  Filled 2022-10-28: qty 1

## 2022-10-28 NOTE — Progress Notes (Signed)
Pt continues to rest comfortably. Will alert to touch, but drifts quickly back to sleep. No signs of agitation or distress. Lactulose given at 1029- Pt has had 2 small, liquid BMs since. Fiance at bedside. Unable to tolerate POs at this time d/t lethargy. VSS and CIWA stable. Will continue to monitor closely.

## 2022-10-28 NOTE — Progress Notes (Signed)
PROGRESS NOTE  [name]  QQV:[id number] DOB: [birth year] DOA: 10/25/2022 PCP: Lilian Coma., MD   Brief Narrative: Patient is a 37 year old male with history of alcoholic liver cirrhosis, portal hypertension, variceal bleeding, congestive  splenomegaly, elevated AFP, current alcohol use who presents to the emergency department after his PCP sent him for further evaluation of low hemoglobin.  No  history of abdominal pain, hematochezia or melena.  Last alcohol intake was 2 days ago before admission date.  On presentation ,he was hemodynamically stable.  Labs with a hemoglobin of 4.9.  FOBT negative.  Labs showed folic acid level of 3.2.  Patient was transfused with 4  units  of PRBC. Patient was admitted for the same in October last year during which oncology and GI were consulted.  He  has history of GI bleed, esophageal varices.  At that time no EGD or colonoscopy was offered by GI and recommended outpatient follow-up. GI consulted on this admission  to rule out variceal bleed.  Hospital course  remarkable for persistent alcohol withdrawal symptoms,elevated ammonia .  On CIWA monitoring/lactulose  Assessment & Plan:  Principal Problem:   Symptomatic anemia Active Problems:   Essential hypertension   Alcoholic cirrhosis of liver without ascites (HCC)   Iron deficiency anemia   GERD (gastroesophageal reflux disease)   Hyperbilirubinemia   Thrombocytopenia (HCC)   Class 1 obesity   Hypokalemia   Prediabetes   Alcohol intoxication in active alcoholic without complication (HCC)   Alcohol withdrawal (HCC)  Severe anemia: Baseline hemoglobin in the range of 7.  Presented with hemoglobin in the range of 4.  No report of melena or hematochezia, FOBT-negative.  Status post transfusion with 4 units of PRBC.  Currently hemoglobin stable in the range of 7 Continue monitor H&H.  No report of new hematochezia, melena or hematemesis  Alcohol withdrawal: Counseled for cessation.  Continue CIWA  protocol.  Continue multivitamins.  TOC consulted.Patient has chronic thrombocytopenia.  Has mildly elevated LFT. Remains confused, sleepy today.    Hepatic encephalopathy: Found to have elevated ammonia level.  Started on oral lactulose but RN was hesitant to give because of his orientation, sleepiness.  Ordered a dose of lactulose enema.  Might need rifaximin if no improvement in the ammonia level  Alcoholic liver  disease/gastric/esophageal varices: GI consulted and folowing. Given history of alcoholic liver disease, esophageal varices there is a good chance that he is having upper GI bleed.   He was also admitted on July 2022 and at that time variceal bleeding was suspected but GI recommended medical management, IR was consulted for TIPS but not done because hemoglobin remained stable. As per GI recommendation, we  consulted interventional radiology for consideration of TIPS/checking gastric varices but IR deferred saying no need of intervention right now because of hemodynamically stability, stable hemoglobin. CT angiogram showed duodenal varices, portosystemic shunt, splenorenal shunt.GI planning for EGD at some point  Gingivitis: He complained of bleeding from the right lower gums/teeth but unlikely to cause of this severe anemia from that.  Dr. Haig Prophet, dentistry, saw the patient in house.  Recommend outpatient follow-up  Folic acid deficiency: Secondary to chronic alcohol abuse.  Continue supplementation  Hypertension/portal hypertension: On Lasix, spironolactone.  Currently on hold because of poor oral intake, started on IV fluids. no significant ascites on presentation  Hypokalemia: Supplemented and being monitored.  GERD: PPI  Obesity: BMI of 31.2         DVT prophylaxis:SCDs Start: 10/25/22 1519     Code  Status: Full Code  Family Communication:Fiance at bedside   Patient status:Inpatient  Patient is from :Home  Anticipated discharge DQ:QIWL  Estimated DC  date:not sure   Consultants: GI,dentistry  Procedures:None  Antimicrobials:  Anti-infectives (From admission, onward)    None       Subjective: Patient seen and examined at bedside today.  Hemodynamically stable but remains confused, lying in bed, slightly sleepy.  Long discussion held with the Fiance at bedside about his treatment plan  Objective: Vitals:   10/27/22 2223 10/28/22 0107 10/28/22 0621 10/28/22 0829  BP: 128/65 (!) 151/78 137/68 (!) 143/76  Pulse: (!) 115 (!) 103 (!) 109 96  Resp:  (!) 24  20  Temp:  98.7 F (37.1 C)  98.1 F (36.7 C)  TempSrc:  Oral    SpO2:  100% 100% 100%  Weight:      Height:        Intake/Output Summary (Last 24 hours) at 10/28/2022 1105 Last data filed at 10/28/2022 0953 Gross per 24 hour  Intake 360 ml  Output 300 ml  Net 60 ml   Filed Weights   10/25/22 1113 10/25/22 1509 10/26/22 0511  Weight: 94.8 kg 95 kg 96.1 kg    Examination:  General exam: confused/sleepy, not in distress,obese HEENT: PERRL, dental caries, dried blood on the mouth Respiratory system:  no wheezes or crackles  Cardiovascular system: S1 & S2 heard, RRR.  Gastrointestinal system: Abdomen is mildly distended, soft and nontender. Central nervous system: awake but not oriented Extremities: LLE edema, no clubbing ,no cyanosis Skin: No rashes, no ulcers,no icterus     Data Reviewed: I have personally reviewed following labs and imaging studies  CBC: Recent Labs  Lab 10/25/22 1135 10/25/22 2110 10/26/22 0733 10/27/22 0134 10/27/22 0135 10/28/22 0651  WBC 7.2  --  4.9 5.9  --  7.1  NEUTROABS 5.2  --   --   --   --   --   HGB 4.9* 5.7* 6.1* 7.2* 7.3* 7.3*  HCT 16.8* 19.4* 19.3* 23.3* 23.8* 24.2*  MCV 77.1*  --  77.5* 79.3*  --  81.2  PLT 136*  --  120* 122*  --  138*   Basic Metabolic Panel: Recent Labs  Lab 10/25/22 1135 10/25/22 2110 10/26/22 0733 10/27/22 0153 10/28/22 0651  NA 137  --  132* 132* 133*  K 2.9*  --  3.2* 3.2* 3.5  CL  100  --  99 101 102  CO2 26  --  25 24 22   GLUCOSE 122*  --  84 93 82  BUN 6  --  6 7 10   CREATININE 0.59*  --  0.58* 0.67 0.69  CALCIUM 7.5*  --  7.6* 7.8* 8.2*  MG  --  1.9  --   --   --   PHOS  --  3.2  --   --   --      No results found for this or any previous visit (from the past 240 hour(s)).   Radiology Studies: VAS LOWER EXTREMITY VENOUS (DVT)  Result Date: 10/28/2022  Lower Venous DVT Study Patient Name:  GERALDO HARIS  Date of Exam:   10/28/2022 Medical Rec #: [name]       Accession #:    10/30/2022 Date of Birth: 06/24/1986        Patient Gender: M Patient Age:   58 years Exam Location:  [facility] Procedure:      VAS 20 LOWER EXTREMITY VENOUS (  DVT) Referring Phys: Christropher Gintz --------------------------------------------------------------------------------  Indications: Swelling.  Risk Factors: None identified. Comparison Study: No prior studies. Performing Technologist: Chanda Busing RVT  Examination Guidelines: A complete evaluation includes B-mode imaging, spectral Doppler, color Doppler, and power Doppler as needed of all accessible portions of each vessel. Bilateral testing is considered an integral part of a complete examination. Limited examinations for reoccurring indications may be performed as noted. The reflux portion of the exam is performed with the patient in reverse Trendelenburg.  +-----+---------------+---------+-----------+----------+--------------+ RIGHTCompressibilityPhasicitySpontaneityPropertiesThrombus Aging +-----+---------------+---------+-----------+----------+--------------+ CFV  Full           Yes      Yes                                 +-----+---------------+---------+-----------+----------+--------------+   +---------+---------------+---------+-----------+----------+--------------+ LEFT     CompressibilityPhasicitySpontaneityPropertiesThrombus Aging  +---------+---------------+---------+-----------+----------+--------------+ CFV      Full           Yes      Yes                                 +---------+---------------+---------+-----------+----------+--------------+ SFJ      Full                                                        +---------+---------------+---------+-----------+----------+--------------+ FV Prox  Full                                                        +---------+---------------+---------+-----------+----------+--------------+ FV Mid   Full                                                        +---------+---------------+---------+-----------+----------+--------------+ FV DistalFull                                                        +---------+---------------+---------+-----------+----------+--------------+ PFV      Full                                                        +---------+---------------+---------+-----------+----------+--------------+ POP      Full           Yes      Yes                                 +---------+---------------+---------+-----------+----------+--------------+ PTV      Full                                                        +---------+---------------+---------+-----------+----------+--------------+  PERO     Full                                                        +---------+---------------+---------+-----------+----------+--------------+     Summary: RIGHT: - No evidence of common femoral vein obstruction.  LEFT: - There is no evidence of deep vein thrombosis in the lower extremity.  - No cystic structure found in the popliteal fossa.  *See table(s) above for measurements and observations.    Preliminary    CT Angio Chest/Abd/Pel for Dissection W and/or W/WO  Addendum Date: 10/27/2022   ADDENDUM REPORT: 10/27/2022 09:30 ADDENDUM: Additional review of imaging identifies a small cluster of ectopic varices in the duodenum at the  transition from the D2 to D3 segments. Also, in addition to the large spleno renal shunt described in the initial report, there is a second large portosystemic shunt in the left lower quadrant consisting of a communication between the SMV and the retroperitoneal venous structures. There are likely some ectopic varices within the fourth portion of the duodenum in the region of the ligament of Treitz. Two separate clusters of ectopic duodenal varices, one at the junction of the D2 and D3 segments and one in the distal D4 segment in the region of the ligament of Treitz could represent sources of GI bleeding. These results will be called to the ordering clinician or representative by the Radiologist Assistant, and communication documented in the PACS or [employer]. Electronically Signed   By: Jacqulynn Cadet M.D.   On: 10/27/2022 09:30   Result Date: 10/27/2022 CLINICAL DATA:  37 year old male with a history of cirrhosis, portal hypertension and varices. EXAM: CT ANGIOGRAPHY CHEST, ABDOMEN AND PELVIS TECHNIQUE: Non-contrast CT of the chest was initially obtained. Multidetector CT imaging through the chest, abdomen and pelvis was performed using the standard protocol during bolus administration of intravenous contrast. Multiplanar reconstructed images and MIPs were obtained and reviewed to evaluate the vascular anatomy. RADIATION DOSE REDUCTION: This exam was performed according to the departmental dose-optimization program which includes automated exposure control, adjustment of the mA and/or kV according to patient size and/or use of iterative reconstruction technique. CONTRAST:  113mL OMNIPAQUE IOHEXOL 350 MG/ML SOLN COMPARISON:  Prior CT scan of the chest, abdomen and pelvis 01/13/2022 FINDINGS: CTA CHEST FINDINGS Cardiovascular: Significant cardiomegaly, advanced for age. Conventional 3 vessel arch anatomy. No evidence of aneurysm or dissection. Mediastinum/Nodes: Unremarkable CT appearance of the thyroid  gland. No suspicious mediastinal or hilar adenopathy. No soft tissue mediastinal mass. The thoracic esophagus is unremarkable given limitations of arterial phase study. Lungs/Pleura: Lungs are clear. No pleural effusion or pneumothorax. Musculoskeletal: No chest wall abnormality. No acute or significant osseous findings. Review of the MIP images confirms the above findings. CTA ABDOMEN AND PELVIS FINDINGS VASCULAR Aorta: Normal caliber aorta without aneurysm, dissection, vasculitis or significant stenosis. Celiac: Patent without evidence of aneurysm, dissection, vasculitis or significant stenosis. SMA: Patent without evidence of aneurysm, dissection, vasculitis or significant stenosis. Renals: Both renal arteries are patent without evidence of aneurysm, dissection, vasculitis, fibromuscular dysplasia or significant stenosis. IMA: Patent without evidence of aneurysm, dissection, vasculitis or significant stenosis. Inflow: Patent without evidence of aneurysm, dissection, vasculitis or significant stenosis. Veins: Somewhat limited evaluation given arterial phase timing of the study. The portal veins appear grossly patent. No evidence of gastro renal shunt  or gastric varices. No significant esophageal varices are noted. 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 Small recanalized periumbilical vein. Review of the MIP images confirms the above findings. NON-VASCULAR Hepatobiliary: Low-attenuation heterogeneous liver. The morphology is suggestive of cirrhosis. No definite enhancing lesion. Stones layer within the gallbladder lumen. No intra or extrahepatic biliary ductal dilatation. Pancreas: Unremarkable. No pancreatic ductal dilatation or surrounding inflammatory changes. Spleen: Mild splenomegaly. Adrenals/Urinary Tract: Adrenal glands are unremarkable. Kidneys are normal, without renal calculi, focal lesion, or hydronephrosis. Bladder is unremarkable. Stomach/Bowel: Limited evaluation given arterial phase timing. Small  esophageal varices may be present. No definite gastric varices. No focal bowel wall thickening or evidence of obstruction. Lymphatic: No suspicious lymphadenopathy. Reproductive: Prostate is unremarkable. Other: No abdominal wall hernia or abnormality. No abdominopelvic ascites. Musculoskeletal: No acute or significant osseous findings. Review of the MIP images confirms the above findings. IMPRESSION: 1. Somewhat limited evaluation of esophageal and gastric varices given arterial phase timing of this dissection protocol CT arteriogram of the chest abdomen and pelvis. For future reference, evaluation of portal venous anatomy and venous varicosities is best studied with a BRTO (balloon occluded transvenous retrograde obliteration) protocol CT scan of the abdomen and pelvis with contrast. If there is ever a question of which study to order, interventional radiology is available 24/7/365 to answer questions. 2. There may be small esophageal varices although not definitive in the absence of dedicated venous phase imaging. No evidence of gastro renal shunt or gastric varices. 3. Evidence of portal hypertension with splenomegaly and large splenorenal portosystemic shunt. 4. Hepatic cirrhosis. 5. Age advanced cardiomegaly. 6. Cholelithiasis. 7. No focal arterial abnormality. Signed, Sterling Big, MD, RPVI [facility] Rad[facility] Electronically Signed: By: Malachy Moan M.D. On: 10/26/2022 15:47    Scheduled Meds:  ferrous sulfate  325 mg Oral Q breakfast   folic acid  1 mg Oral Daily   lactulose  30 g Oral TID   LORazepam  0-4 mg Oral Q12H   multivitamin with minerals  1 tablet Oral Daily   pantoprazole  40 mg Oral BID   thiamine  100 mg Oral Daily   Or   thiamine  100 mg Intravenous Daily   Continuous Infusions:  sodium chloride 75 mL/hr at 10/28/22 1027     LOS: 1 day   Burnadette Pop, MD [facility] P1/29/2024, 11:05 AM

## 2022-10-28 NOTE — Progress Notes (Signed)
Left lower extremity venous duplex has been completed. Preliminary results can be found in CV Proc through chart review.   10/28/22 9:05 AM [name] RVT

## 2022-10-28 NOTE — Progress Notes (Signed)
[name] 9:38 AM  Subjective: Patient seen and examined in his hospital computer chart reviewed and his case discussed with my partner Dr. Randel Pigg as well as his fiance answered all of her questions discussed alpha-fetoprotein and his CT scan and discussed how if he does not quit drinking we may not have much that we can do for him long-term  Objective: Patient is sleepy but will wake up and falls back asleep vital signs stable no acute distress abdomen is soft nontender BUN and creatinine okay CBC stable INR a touch higher than baseline  Assessment: Alcoholic liver disease with significant anemia and gastric varices  Plan: Might need a ammonia level and consideration of Xifaxan and needs alcohol cessation possibly inpatient rehab as above and consider either inpatient or outpatient endoscopy with possible BRTO as per interventional radiology  [facility] E  office [phone] After 5PM or if no answer call [phone]

## 2022-10-29 ENCOUNTER — Inpatient Hospital Stay (HOSPITAL_COMMUNITY): Payer: Medicaid Other

## 2022-10-29 DIAGNOSIS — D649 Anemia, unspecified: Secondary | ICD-10-CM | POA: Diagnosis not present

## 2022-10-29 LAB — COMPREHENSIVE METABOLIC PANEL
ALT: 23 U/L (ref 0–44)
ALT: 20 U/L (ref 0–44)
AST: 84 U/L — ABNORMAL HIGH (ref 15–41)
AST: 78 U/L — ABNORMAL HIGH (ref 15–41)
Albumin: 2.1 g/dL — ABNORMAL LOW (ref 3.5–5.0)
Albumin: 2.1 g/dL — ABNORMAL LOW (ref 3.5–5.0)
Alkaline Phosphatase: 83 U/L (ref 38–126)
Alkaline Phosphatase: 69 U/L (ref 38–126)
Anion gap: 9 (ref 5–15)
Anion gap: 8 (ref 5–15)
BUN: 9 mg/dL (ref 6–20)
BUN: 8 mg/dL (ref 6–20)
CO2: 20 mmol/L — ABNORMAL LOW (ref 22–32)
CO2: 22 mmol/L (ref 22–32)
Calcium: 8.2 mg/dL — ABNORMAL LOW (ref 8.9–10.3)
Calcium: 7.8 mg/dL — ABNORMAL LOW (ref 8.9–10.3)
Chloride: 104 mmol/L (ref 98–111)
Chloride: 103 mmol/L (ref 98–111)
Creatinine, Ser: 0.82 mg/dL (ref 0.61–1.24)
Creatinine, Ser: 0.7 mg/dL (ref 0.61–1.24)
GFR, Estimated: >60 mL/min (ref >60)
GFR, Estimated: >60 mL/min (ref >60)
Glucose, Bld: 70 mg/dL (ref 70–99)
Glucose, Bld: 120 mg/dL — ABNORMAL HIGH (ref 70–99)
Potassium: 3.3 mmol/L — ABNORMAL LOW (ref 3.5–5.1)
Potassium: 3.6 mmol/L (ref 3.5–5.1)
Sodium: 133 mmol/L — ABNORMAL LOW (ref 135–145)
Sodium: 133 mmol/L — ABNORMAL LOW (ref 135–145)
Total Bilirubin: 12.1 mg/dL — ABNORMAL HIGH (ref 0.3–1.2)
Total Bilirubin: 11.4 mg/dL — ABNORMAL HIGH (ref 0.3–1.2)
Total Protein: 8.1 g/dL (ref 6.5–8.1)
Total Protein: 7.8 g/dL (ref 6.5–8.1)

## 2022-10-29 LAB — VON WILLEBRAND PANEL
Coagulation Factor VIII: 236 % — ABNORMAL HIGH (ref 56–140)
Ristocetin Co-factor, Plasma: 284 % — ABNORMAL HIGH (ref 50–200)
Von Willebrand Antigen, Plasma: >597 % — ABNORMAL HIGH (ref 50–200)

## 2022-10-29 LAB — CBC
HCT: 25.1 % — ABNORMAL LOW (ref 39.0–52.0)
Hemoglobin: 7.5 g/dL — ABNORMAL LOW (ref 13.0–17.0)
MCH: 24.8 pg — ABNORMAL LOW (ref 26.0–34.0)
MCHC: 29.9 g/dL — ABNORMAL LOW (ref 30.0–36.0)
MCV: 83.1 fL (ref 80.0–100.0)
Platelets: 150 10*3/uL (ref 150–400)
RBC: 3.02 MIL/uL — ABNORMAL LOW (ref 4.22–5.81)
RDW: 22.8 % — ABNORMAL HIGH (ref 11.5–15.5)
WBC: 7 10*3/uL (ref 4.0–10.5)
nRBC: 0.4 % — ABNORMAL HIGH (ref 0.0–0.2)

## 2022-10-29 LAB — AMMONIA: Ammonia: 50 umol/L — ABNORMAL HIGH (ref 9–35)

## 2022-10-29 LAB — COAG STUDIES INTERP REPORT

## 2022-10-29 MED ORDER — POTASSIUM CHLORIDE 10 MEQ/100ML IV SOLN
10.0000 meq | INTRAVENOUS | Status: AC
  Administered 2022-10-29 (×4): 10 meq via INTRAVENOUS
  Filled 2022-10-29 (×4): qty 100

## 2022-10-29 NOTE — TOC Initial Note (Signed)
Transition of Care [facility]) - Initial/Assessment Note    Patient Details  Name: [name] MRN: [id number] Date of Birth: 1986/03/24  Transition of Care [facility]) CM/SW Contact:    Leeroy Cha, RN Phone Number: 10/29/2022, 9:10 AM  Clinical Narrative:                  Transition of Care [facility]) Screening Note   Patient Details  Name: [name] Date of Birth: 1985/11/04   Transition of Care [facility]) CM/SW Contact:    Leeroy Cha, RN Phone Number: 10/29/2022, 9:10 AM    Transition of Care Department [facility]) has reviewed patient and no TOC needs have been identified at this time. We will continue to monitor patient advancement through interdisciplinary progression rounds. If new patient transition needs arise, please place a TOC consult.    Expected Discharge Plan: Home/Self Care Barriers to Discharge: Continued Medical Work up   Patient Goals and CMS Choice Patient states their goals for this hospitalization and ongoing recovery are:: to go home CMS Medicare.gov Compare Post Acute Care list provided to:: Patient        Expected Discharge Plan and Services   Discharge Planning Services: CM Consult   Living arrangements for the past 2 months: Single Family Home                                      Prior Living Arrangements/Services Living arrangements for the past 2 months: Single Family Home Lives with:: Significant Other Patient language and need for interpreter reviewed:: Yes Do you feel safe going back to the place where you live?: Yes            Criminal Activity/Legal Involvement Pertinent to Current Situation/Hospitalization: No - Comment as needed  Activities of Daily Living Home Assistive Devices/Equipment: None ADL Screening (condition at time of admission) Patient's cognitive ability adequate to safely complete daily activities?: Yes Is the patient deaf or have difficulty hearing?: No Does the patient have difficulty seeing, even  when wearing glasses/contacts?: No Does the patient have difficulty concentrating, remembering, or making decisions?: No Patient able to express need for assistance with ADLs?: Yes Does the patient have difficulty dressing or bathing?: No Independently performs ADLs?: Yes (appropriate for developmental age) Does the patient have difficulty walking or climbing stairs?: No Weakness of Legs: None Weakness of Arms/Hands: None  Permission Sought/Granted                  Emotional Assessment Appearance:: Appears stated age Attitude/Demeanor/Rapport: Engaged Affect (typically observed): Calm Orientation: : Oriented to Situation, Oriented to  Time, Oriented to Place, Oriented to Self Alcohol / Substance Use: Not Applicable Psych Involvement: No (comment)  Admission diagnosis:  Hypocalcemia [E83.51] Hypokalemia [E87.6] Alcohol abuse [F10.10] Symptomatic anemia [D64.9] Anemia, unspecified type [D64.9] Alcohol withdrawal ([place]) [F10.939] Patient Active Problem List   Diagnosis Date Noted   Alcohol withdrawal ([place]) 10/27/2022   Alcohol intoxication in active alcoholic without complication ([place]) 62/95/2841   Elevated AFP 08/20/2022   Hyperammonemia ([place]) 07/31/2022   Class 1 obesity 07/28/2022   Hypokalemia 07/28/2022   Hypomagnesemia 07/28/2022   Symptomatic anemia 07/27/2022   Thrombocytopenia ([place]) 07/27/2022   GERD (gastroesophageal reflux disease) 07/26/2022   Coagulopathy ([place]) 07/26/2022   Hematoma 07/26/2022   Hyperbilirubinemia 07/26/2022   Hypoalbuminemia 01/29/2022   Iron deficiency anemia 01/12/2022   Apnea 01/11/2022   Murmur 32/44/0102   Alcoholic  cirrhosis of liver without ascites ([place]) 05/21/2021   GI bleeding 04/25/2021   Essential hypertension 04/25/2021   Acute blood loss anemia 04/25/2021   Hyperglobulinemia 12/23/2019   Prediabetes 04/29/2019   Proteinuria 04/29/2019   PCP:  Lilian Coma., MD Pharmacy:   [id number] [address], [place] [address] Phone: [phone] Fax: [phone]     Social Determinants of Health (SDOH) Social History: SDOH Screenings   Food Insecurity: No Food Insecurity (10/25/2022)  Housing: Low Risk  (10/25/2022)  Transportation Needs: No Transportation Needs (10/25/2022)  Utilities: Not At Risk (10/25/2022)  Depression (PHQ2-9): Low Risk  (05/21/2021)  Tobacco Use: Low Risk  (10/25/2022)   SDOH Interventions:     Readmission Risk Interventions   No data to display

## 2022-10-29 NOTE — Progress Notes (Signed)
[name] 12:15 PM  Subjective: Patient seen and examined and case discussed with his fiance and he has no specific complaints and no signs of bleeding and is waking up just a little and is eating more and has no new complaints  Objective: Vital signs stable afebrile no acute distress abdomen is soft nontender still pretty lethargic labs stable  Assessment: Alcoholic cirrhosis  Plan: No further recommendations at this time probably would benefit from inpatient alcohol rehab will need low salt and low ammonia diet at home and will need outpatient endoscopy soon and please call me if I could be of any further assistance with this hospital stay  [facility] E  office [phone] After 5PM or if no answer call [phone]

## 2022-10-29 NOTE — Evaluation (Signed)
Physical Therapy Evaluation Patient Details Name: [name] MRN: [id number] DOB: 1986/05/19 Today's Date: 10/29/2022  History of Present Illness  Pt is a 37 y/o M admitted on 10/25/22 after presenting to the ED after his PCP referred him for evaluation of low Hgb. Hgb was found to be 4.9 & pt received 4 units of PRBC. PMH: alcoholic liver cirrhosis, portal HTN, variceal bleeding, congestive splenomegaly  Clinical Impression  Pt seen for PT evaluation with pt agreeable, pt's parents in room. Prior to admission pt was independent, working, & driving. Pt lives with his fiance & daughter in a 1 level home with 3 steps with R rail to enter. On this date, pt requires min assist for STS & mod assist for step pivot without AD. Provided pt with RW & pt able to ambulate increased distances with CGA. Pt demonstrates generalized weakness & impaired coordination. Recommend HHPT services upon d/c but will continue to follow pt acutely to address balance, strengthening, and gait with LRAD.   Recommendations for follow up therapy are one component of a multi-disciplinary discharge planning process, led by the attending physician.  Recommendations may be updated based on patient status, additional functional criteria and insurance authorization.  Follow Up Recommendations Home health PT      Assistance Recommended at Discharge Intermittent Supervision/Assistance  Patient can return home with the following  A little help with walking and/or transfers;A little help with bathing/dressing/bathroom;Assistance with cooking/housework;Assist for transportation;Help with stairs or ramp for entrance;Direct supervision/assist for medications management;Direct supervision/assist for financial management    Equipment Recommendations Rolling walker (2 wheels)  Recommendations for Other Services  OT consult    Functional Status Assessment Patient has had a recent decline in their functional status and demonstrates the  ability to make significant improvements in function in a reasonable and predictable amount of time.     Precautions / Restrictions Precautions Precautions: Fall Restrictions Weight Bearing Restrictions: No      Mobility  Bed Mobility Overal bed mobility: Needs Assistance Bed Mobility: Supine to Sit     Supine to sit: Supervision, HOB elevated          Transfers Overall transfer level: Needs assistance Equipment used: None Transfers: Sit to/from Stand, Bed to chair/wheelchair/BSC Sit to Stand: Min assist   Step pivot transfers: Mod assist       General transfer comment: Pt requires extra time to come to STS from EOB, step pivot bed>recliner without AD with mod assist 2/2 decreased balance during weight shifting, decreased stepping BLE, posterior lean.    Ambulation/Gait Ambulation/Gait assistance: Min assist Gait Distance (Feet): 150 Feet Assistive device: Rolling walker (2 wheels) Gait Pattern/deviations: Decreased step length - right, Decreased step length - left, Decreased stride length Gait velocity: decreased     General Gait Details: Requires cuing to maneuver RW around obstacles, education re: proper/safe use of RW.  Stairs            Wheelchair Mobility    Modified Rankin (Stroke Patients Only)       Balance Overall balance assessment: Needs assistance Sitting-balance support: Feet supported Sitting balance-Leahy Scale: Fair Sitting balance - Comments: supervision sitting   Standing balance support: No upper extremity supported, During functional activity Standing balance-Leahy Scale: Poor                               Pertinent Vitals/Pain Pain Assessment Pain Assessment: No/denies pain    Home Living Family/patient expects  to be discharged to:: Private residence Living Arrangements: Spouse/significant other;Children (fiance & child) Available Help at Discharge: Family;Available PRN/intermittently (fiance works out of  the home 3 days/week, reports his brother can check in on him) Type of Home: House Home Access: Stairs to enter Entrance Stairs-Rails: Right Entrance Stairs-Number of Steps: 4   Home Layout: One level Home Equipment: None      Prior Function Prior Level of Function : Working/employed;Driving;Independent/Modified Independent             Mobility Comments: Pt reports he's independent without AD, driving, works at a [employer] at [employer]        Extremity/Trunk Assessment   Upper Extremity Assessment Upper Extremity Assessment: Generalized weakness (pt with decreased BUE shoulder flexion when attempting to feed himself in bed)    Lower Extremity Assessment Lower Extremity Assessment: Generalized weakness    Cervical / Trunk Assessment Cervical / Trunk Assessment: Normal  Communication   Communication: No difficulties  Cognition Arousal/Alertness: Awake/alert Behavior During Therapy: Flat affect Overall Cognitive Status: Impaired/Different from baseline Area of Impairment: Safety/judgement, Awareness, Following commands, Problem solving                       Following Commands: Follows one step commands consistently, Follows one step commands with increased time Safety/Judgement: Decreased awareness of deficits, Decreased awareness of safety Awareness: Anticipatory Problem Solving: Requires verbal cues, Slow processing General Comments: Follows simple commands with extra time during session.        General Comments General comments (skin integrity, edema, etc.): HR 109 bpm during gait    Exercises     Assessment/Plan    PT Assessment All further PT needs can be met in the next venue of care  PT Problem List Decreased strength;Decreased range of motion;Decreased coordination;Decreased activity tolerance;Decreased balance;Decreased mobility;Decreased safety awareness;Decreased knowledge of use of DME;Decreased cognition        PT Treatment Interventions DME instruction;Therapeutic exercise;Balance training;Stair training;Neuromuscular re-education;Gait training;Functional mobility training;Therapeutic activities;Patient/family education;Cognitive remediation    PT Goals (Current goals can be found in the Care Plan section)  Acute Rehab PT Goals Patient Stated Goal: get better PT Goal Formulation: With patient Time For Goal Achievement: 11/12/22 Potential to Achieve Goals: Good    Frequency Min 3X/week     Co-evaluation               AM-PAC PT "6 Clicks" Mobility  Outcome Measure Help needed turning from your back to your side while in a flat bed without using bedrails?: None Help needed moving from lying on your back to sitting on the side of a flat bed without using bedrails?: A Little Help needed moving to and from a bed to a chair (including a wheelchair)?: A Lot Help needed standing up from a chair using your arms (e.g., wheelchair or bedside chair)?: A Little Help needed to walk in hospital room?: A Little Help needed climbing 3-5 steps with a railing? : A Lot 6 Click Score: 17    End of Session   Activity Tolerance: Patient tolerated treatment well Patient left: in chair;with chair alarm set;with call bell/phone within reach Nurse Communication: Mobility status PT Visit Diagnosis: Other abnormalities of gait and mobility (R26.89);Unsteadiness on feet (R26.81);Difficulty in walking, not elsewhere classified (R26.2);Muscle weakness (generalized) (M62.81)    Time: 6433-2951 PT Time Calculation (min) (ACUTE ONLY): 19 min   Charges:   PT Evaluation $PT Eval Moderate Complexity: 1 Mod  Lavone Nian, PT, DPT 10/29/22, 3:41 PM   Waunita Schooner 10/29/2022, 3:38 PM

## 2022-10-29 NOTE — Progress Notes (Signed)
PROGRESS NOTE  [name]  NKN:[id number] DOB: 1986/04/29 DOA: 10/25/2022 PCP: Lilian Coma., MD   Brief Narrative: Patient is a 37 year old male with history of alcoholic liver cirrhosis, portal hypertension, variceal bleeding, congestive  splenomegaly,  current alcohol use who presents to the emergency department after his PCP sent him for further evaluation of low hemoglobin.  No  history of abdominal pain, hematochezia or melena.  Last alcohol intake was 2 days ago before admission date.  On presentation ,he was hemodynamically stable.  Labs with a hemoglobin of 4.9.  FOBT negative.  Labs showed folic acid level of 3.2.  Patient was transfused with 4  units  of PRBC during this hospitalization. GI consulted on this admission  to rule out variceal bleed.  Hospital course  remarkable for persistent alcohol withdrawal symptoms,elevated ammonia .  On CIWA monitoring/lactulose  Assessment & Plan:  Principal Problem:   Symptomatic anemia Active Problems:   Essential hypertension   Alcoholic cirrhosis of liver without ascites (HCC)   Iron deficiency anemia   GERD (gastroesophageal reflux disease)   Hyperbilirubinemia   Thrombocytopenia (HCC)   Class 1 obesity   Hypokalemia   Prediabetes   Alcohol intoxication in active alcoholic without complication (HCC)   Alcohol withdrawal (HCC)  Severe anemia: Baseline hemoglobin in the range of 7.  Presented with hemoglobin in the range of 4.  No report of melena or hematochezia, FOBT-negative.  Status post transfusion with 4 units of PRBC.  Currently hemoglobin stable in the range of 7 Continue monitor H&H.  No report of new hematochezia, melena or hematemesis  Alcohol withdrawal: Counseled for cessation.  Continue CIWA protocol.  Continue multivitamins.  TOC consulted.Patient has chronic thrombocytopenia.  Has mildly elevated LFT. Spouse interested on inpatient alcohol rehabilitation,TOC consulted  Hepatic encephalopathy: Found to have  elevated ammonia level.  Started on oral lactulose.If he doesnot take orally needs lactulose enema, monitor ammonia  level daily.  Might need rifaximin if no improvement in the ammonia level  Alcoholic liver  disease/gastric/esophageal varices: GI consulted and folowing. Given history of alcoholic liver disease, esophageal varices there is a good chance that he is having upper GI bleed.   He was also admitted on July 2022 and at that time variceal bleeding was suspected but GI recommended medical management, IR was consulted for TIPS but not done because hemoglobin remained stable. As per GI recommendation, we  consulted interventional radiology for consideration of TIPS/checking gastric varices but IR deferred saying no need of intervention right now because of hemodynamically stability, stable hemoglobin. CT angiogram showed duodenal varices, portosystemic shunt, splenorenal shunt.GI planning for EGD at some point  Gingivitis: He complained of bleeding from the right lower gums/teeth but unlikely to cause of this severe anemia from that.  Dr. Haig Prophet, dentistry, saw the patient in house.  Recommend outpatient follow-up  Folic acid deficiency: Secondary to chronic alcohol abuse.  Continue supplementation  Hypertension/portal hypertension: On Lasix, spironolactone.  Currently on hold because of poor oral intake, started on gentle IV fluids. no significant ascites on presentation  Hypokalemia: Supplemented and being monitored.  Left lower extremity edema: Chronic.  Venous  Doppler negative for DVT  GERD: PPI  Obesity: BMI of 31.2  Weakness/debility/left hip pain: Report of fall a few days ago.  Checking left hip x-ray.  PT will be consulted         DVT prophylaxis:SCDs Start: 10/25/22 1519     Code Status: Full Code  Family Communication:Fiance at bedside today  Patient  status:Inpatient  Patient is from :Home  Anticipated discharge GT:XMIW  Estimated DC date:not  sure   Consultants: GI,dentistry  Procedures:None  Antimicrobials:  Anti-infectives (From admission, onward)    None       Subjective: Patient seen and examined at bedside today.  He appears better than yesterday.  More alert and awake.  He knows that today's date is Tuesday.  He knows that he is in the hospital.  Still looks weak, drowsy/sleepy.  No report of new hematochezia or melena or hematemesis.  Long discussion held with the fiance at bedside  Objective: Vitals:   10/28/22 1138 10/28/22 1946 10/29/22 0506 10/29/22 0805  BP: 131/79 132/67 (!) 146/75 (!) 141/77  Pulse: 95 100 92 99  Resp: (!) 25 (!) 24 (!) 26 (!) 24  Temp: 98 F (36.7 C) 98.4 F (36.9 C) 98.3 F (36.8 C) 98.1 F (36.7 C)  TempSrc: Axillary Axillary Oral Oral  SpO2: 99% 100% 99% 100%  Weight:      Height:        Intake/Output Summary (Last 24 hours) at 10/29/2022 1034 Last data filed at 10/29/2022 8032 Gross per 24 hour  Intake 1730.91 ml  Output 600 ml  Net 1130.91 ml   Filed Weights   10/25/22 1113 10/25/22 1509 10/26/22 0511  Weight: 94.8 kg 95 kg 96.1 kg    Examination:  General exam:Weak appearing, drowsy HEENT: PERRL, dental caries, dried blood in the mouth Respiratory system:  no wheezes or crackles  Cardiovascular system: S1 & S2 heard, RRR.  Gastrointestinal system: Abdomen is mildly distended, soft and nontender. Central nervous system: Sleepy/drowsy but oriented to time and place Extremities: Left lower extremity edema, no clubbing ,no cyanosis Skin: No rashes, no ulcers,no icterus     Data Reviewed: I have personally reviewed following labs and imaging studies  CBC: Recent Labs  Lab 10/25/22 1135 10/25/22 2110 10/26/22 0733 10/27/22 0134 10/27/22 0135 10/28/22 0651 10/29/22 0409  WBC 7.2  --  4.9 5.9  --  7.1 7.0  NEUTROABS 5.2  --   --   --   --   --   --   HGB 4.9*   < > 6.1* 7.2* 7.3* 7.3* 7.5*  HCT 16.8*   < > 19.3* 23.3* 23.8* 24.2* 25.1*  MCV 77.1*  --   77.5* 79.3*  --  81.2 83.1  PLT 136*  --  120* 122*  --  138* 150   < > = values in this interval not displayed.   Basic Metabolic Panel: Recent Labs  Lab 10/25/22 1135 10/25/22 2110 10/26/22 0733 10/27/22 0153 10/28/22 0651 10/29/22 0409  NA 137  --  132* 132* 133* 133*  K 2.9*  --  3.2* 3.2* 3.5 3.3*  CL 100  --  99 101 102 104  CO2 26  --  25 24 22  20*  GLUCOSE 122*  --  84 93 82 70  BUN 6  --  6 7 10 9   CREATININE 0.59*  --  0.58* 0.67 0.69 0.82  CALCIUM 7.5*  --  7.6* 7.8* 8.2* 8.2*  MG  --  1.9  --   --   --   --   PHOS  --  3.2  --   --   --   --      No results found for this or any previous visit (from the past 240 hour(s)).   Radiology Studies: VAS [place] LOWER EXTREMITY VENOUS (DVT)  Result Date: 10/28/2022  Lower Venous DVT Study Patient Name:  ELERY CADENHEAD  Date of Exam:   10/28/2022 Medical Rec #: [id number]       Accession #:    [id number] Date of Birth: 1986-02-07        Patient Gender: M Patient Age:   55 years Exam Location:  [facility] Procedure:      VAS [place] LOWER EXTREMITY VENOUS (DVT) Referring Phys: Aamiyah Derrick --------------------------------------------------------------------------------  Indications: Swelling.  Risk Factors: None identified. Comparison Study: No prior studies. Performing Technologist: Chanda Busing RVT  Examination Guidelines: A complete evaluation includes B-mode imaging, spectral Doppler, color Doppler, and power Doppler as needed of all accessible portions of each vessel. Bilateral testing is considered an integral part of a complete examination. Limited examinations for reoccurring indications may be performed as noted. The reflux portion of the exam is performed with the patient in reverse Trendelenburg.  +-----+---------------+---------+-----------+----------+--------------+ RIGHTCompressibilityPhasicitySpontaneityPropertiesThrombus Aging +-----+---------------+---------+-----------+----------+--------------+ CFV  Full            Yes      Yes                                 +-----+---------------+---------+-----------+----------+--------------+   +---------+---------------+---------+-----------+----------+--------------+ LEFT     CompressibilityPhasicitySpontaneityPropertiesThrombus Aging +---------+---------------+---------+-----------+----------+--------------+ CFV      Full           Yes      Yes                                 +---------+---------------+---------+-----------+----------+--------------+ SFJ      Full                                                        +---------+---------------+---------+-----------+----------+--------------+ FV Prox  Full                                                        +---------+---------------+---------+-----------+----------+--------------+ FV Mid   Full                                                        +---------+---------------+---------+-----------+----------+--------------+ FV DistalFull                                                        +---------+---------------+---------+-----------+----------+--------------+ PFV      Full                                                        +---------+---------------+---------+-----------+----------+--------------+ POP      Full  Yes      Yes                                 +---------+---------------+---------+-----------+----------+--------------+ PTV      Full                                                        +---------+---------------+---------+-----------+----------+--------------+ PERO     Full                                                        +---------+---------------+---------+-----------+----------+--------------+     Summary: RIGHT: - No evidence of common femoral vein obstruction.  LEFT: - There is no evidence of deep vein thrombosis in the lower extremity.  - No cystic structure found in the popliteal fossa.  *See table(s)  above for measurements and observations. Electronically signed by Monica Martinez MD on 10/28/2022 at 11:24:56 AM.    Final     Scheduled Meds:  ferrous sulfate  325 mg Oral Q breakfast   folic acid  1 mg Oral Daily   lactulose  30 g Oral TID   LORazepam  0-4 mg Oral Q12H   multivitamin with minerals  1 tablet Oral Daily   pantoprazole  40 mg Oral BID   thiamine  100 mg Oral Daily   Or   thiamine  100 mg Intravenous Daily   Continuous Infusions:  sodium chloride 75 mL/hr at 10/29/22 0500   potassium chloride 10 mEq (10/29/22 1027)     LOS: 2 days   Shelly Coss, MD [facility] P1/30/2024, 10:34 AM

## 2022-10-30 DIAGNOSIS — K7030 Alcoholic cirrhosis of liver without ascites: Secondary | ICD-10-CM

## 2022-10-30 DIAGNOSIS — F10930 Alcohol use, unspecified with withdrawal, uncomplicated: Secondary | ICD-10-CM | POA: Diagnosis not present

## 2022-10-30 DIAGNOSIS — F10220 Alcohol dependence with intoxication, uncomplicated: Secondary | ICD-10-CM

## 2022-10-30 DIAGNOSIS — D509 Iron deficiency anemia, unspecified: Secondary | ICD-10-CM

## 2022-10-30 DIAGNOSIS — I10 Essential (primary) hypertension: Secondary | ICD-10-CM

## 2022-10-30 DIAGNOSIS — E669 Obesity, unspecified: Secondary | ICD-10-CM

## 2022-10-30 DIAGNOSIS — D649 Anemia, unspecified: Secondary | ICD-10-CM | POA: Diagnosis not present

## 2022-10-30 DIAGNOSIS — D696 Thrombocytopenia, unspecified: Secondary | ICD-10-CM

## 2022-10-30 LAB — CBC
HCT: 22.1 % — ABNORMAL LOW (ref 39.0–52.0)
Hemoglobin: 6.7 g/dL — CL (ref 13.0–17.0)
MCH: 25 pg — ABNORMAL LOW (ref 26.0–34.0)
MCHC: 30.3 g/dL (ref 30.0–36.0)
MCV: 82.5 fL (ref 80.0–100.0)
Platelets: 150 10*3/uL (ref 150–400)
RBC: 2.68 MIL/uL — ABNORMAL LOW (ref 4.22–5.81)
RDW: 23.3 % — ABNORMAL HIGH (ref 11.5–15.5)
WBC: 5.4 10*3/uL (ref 4.0–10.5)
nRBC: 0.4 % — ABNORMAL HIGH (ref 0.0–0.2)

## 2022-10-30 LAB — PREPARE RBC (CROSSMATCH)

## 2022-10-30 LAB — AMMONIA: Ammonia: 50 umol/L — ABNORMAL HIGH (ref 9–35)

## 2022-10-30 LAB — BILIRUBIN, FRACTIONATED(TOT/DIR/INDIR)
Bilirubin, Direct: 5.7 mg/dL — ABNORMAL HIGH (ref 0.0–0.2)
Indirect Bilirubin: 6.4 mg/dL — ABNORMAL HIGH (ref 0.3–0.9)
Total Bilirubin: 12.1 mg/dL — ABNORMAL HIGH (ref 0.3–1.2)

## 2022-10-30 LAB — HEMOGLOBIN AND HEMATOCRIT, BLOOD
HCT: 22.1 % — ABNORMAL LOW (ref 39.0–52.0)
HCT: 26.9 % — ABNORMAL LOW (ref 39.0–52.0)
Hemoglobin: 6.6 g/dL — CL (ref 13.0–17.0)
Hemoglobin: 8.1 g/dL — ABNORMAL LOW (ref 13.0–17.0)

## 2022-10-30 LAB — LACTATE DEHYDROGENASE: LDH: 261 U/L — ABNORMAL HIGH (ref 98–192)

## 2022-10-30 MED ORDER — SODIUM CHLORIDE 0.9% IV SOLUTION: Freq: Once | INTRAVENOUS | Status: AC

## 2022-10-30 NOTE — Hospital Course (Addendum)
[name] is a 37 y.o. male with a history of alcoholic liver cirrhosis, portal hypertension, congestive splenomegaly, hyperammonemia, elevated AFP, coagulopathy, alcohol use, hypertension, GERD, prediabetes, obesity.  Patient presented secondary to abnormal hemoglobin of 5.1 by PCPs office and found to have acute anemia with hemoglobin of 4.9 on admission.  GI consulted for concern of possible upper GI bleeding secondary to risk factors of cirrhosis and portal hypertension, however patient did not have any history of hemoptysis, melena or hematochezia.  Patient managed symptomatically with blood transfusion.  GI with initial recommendation for no inpatient endoscopic procedure but performed an upper endoscopy revealing non-bleeding gastric varices in addition to portal hypertensive gastropathy. Patient discharged with modification to outpatient medication regimen.

## 2022-10-30 NOTE — Evaluation (Signed)
Occupational Therapy Evaluation Patient Details Name: [name] MRN: [id number] DOB: 22-May-1986 Today's Date: 10/30/2022   History of Present Illness Pt is a 37 yr old male admitted on 10/25/22 after presenting to the ED after his PCP referred him for evaluation of low hemoglobin of 4.9; pt received 4 units of PRBC. PMH: alcoholic liver cirrhosis, portal HTN, variceal bleeding, congestive splenomegaly)   Clinical Impression   The pt is currently presenting below his baseline level of functioning for self-care management, given deconditioning, slight generalized weakness, and unsteadiness in standing. He reported his current strength being ~30% decreased from his normal. He was able to perform lower body dressing, sit to stand, and ambulating a short distance to the bedside chair using a RW, requiring approximately min assist. He will benefit from further OT services to maximize his safety and independence with self-care tasks & to facilitate his safe discharge from the hospital.       Recommendations for follow up therapy are one component of a multi-disciplinary discharge planning process, led by the attending physician.  Recommendations may be updated based on patient status, additional functional criteria and insurance authorization.   Follow Up Recommendations  Home health OT vs. No follow-up therapy needs, pending functional progress    Assistance Recommended at Discharge Intermittent Supervision/Assistance  Patient can return home with the following Assist for transportation;Assistance with cooking/housework;Help with stairs or ramp for entrance;A little help with walking and/or transfers;A little help with bathing/dressing/bathroom    Functional Status Assessment  Patient has had a recent decline in their functional status and demonstrates the ability to make significant improvements in function in a reasonable and predictable amount of time.  Equipment Recommendations  Tub/shower  seat       Precautions / Restrictions Precautions Precautions: Fall Restrictions Weight Bearing Restrictions: No      Mobility Bed Mobility Overal bed mobility: Needs Assistance Bed Mobility: Supine to Sit     Supine to sit: Supervision, HOB elevated          Transfers Overall transfer level: Needs assistance Equipment used: Rolling walker (2 wheels) Transfers: Sit to/from Stand Sit to Stand: Min assist                  Balance     Sitting balance-Leahy Scale: Good         Standing balance comment: Min guard to min assist with RW            ADL either performed or assessed with clinical judgement   ADL Overall ADL's : Needs assistance/impaired Eating/Feeding: Independent;Sitting Eating/Feeding Details (indicate cue type and reason): based on clinical judgement Grooming: Min guard;Set up Grooming Details (indicate cue type and reason): Set-up seated or min guard standing         Upper Body Dressing : Set up;Sitting   Lower Body Dressing: Minimal assistance                 Vision   Additional Comments: He correctly read the time depicted on the wall clock            Pertinent Vitals/Pain Pain Assessment Pain Assessment: No/denies pain     Hand Dominance Right   Extremity/Trunk Assessment Upper Extremity Assessment Upper Extremity Assessment: Overall WFL for tasks assessed   Lower Extremity Assessment Lower Extremity Assessment: Overall WFL for tasks assessed       Communication Communication Communication: No difficulties   Cognition   Behavior During Therapy: Flat affect  Following Commands: Follows one step commands consistently     Problem Solving:  (slightly delayed cognitive processing) General Comments: Oriented to person, place, month, and year                Home Living Family/patient expects to be discharged to:: Private residence Living Arrangements: Spouse/significant other   Type of  Home: House Home Access: Stairs to enter [employer] of Steps: 3   Home Layout: One level     Bathroom Shower/Tub: Tub/shower unit;Walk-in shower         Home Equipment: None          Prior Functioning/Environment Prior Level of Function : Working/employed;Driving;Independent/Modified Independent             Mobility Comments: He was independent with ambulation. ADLs Comments: He was independent with ADLs, cooking, working, and driving.        OT Problem List: Decreased strength;Decreased activity tolerance;Impaired balance (sitting and/or standing);Decreased knowledge of use of DME or AE;Increased edema      OT Treatment/Interventions: Self-care/ADL training;Therapeutic exercise;Therapeutic activities;Energy conservation;Patient/family education;DME and/or AE instruction;Balance training    OT Goals(Current goals can be found in the care plan section) Acute Rehab OT Goals OT Goal Formulation: With patient Time For Goal Achievement: 11/13/22 Potential to Achieve Goals: Good  OT Frequency: Min 2X/week       AM-PAC OT "6 Clicks" Daily Activity     Outcome Measure Help from another person eating meals?: None Help from another person taking care of personal grooming?: A Little Help from another person toileting, which includes using toliet, bedpan, or urinal?: A Little Help from another person bathing (including washing, rinsing, drying)?: A Little Help from another person to put on and taking off regular upper body clothing?: None Help from another person to put on and taking off regular lower body clothing?: A Little 6 Click Score: 20   End of Session Equipment Utilized During Treatment: Rolling walker (2 wheels);Gait belt Nurse Communication: Mobility status  Activity Tolerance: Patient tolerated treatment well Patient left: in chair;with call bell/phone within reach;with chair alarm set;with family/visitor present  OT Visit Diagnosis: Unsteadiness  on feet (R26.81);Muscle weakness (generalized) (M62.81)                Time: 5852-7782 OT Time Calculation (min): 16 min Charges:  OT General Charges $OT Visit: 1 Visit OT Evaluation $OT Eval Moderate Complexity: 1 Mod    Artemis Koller L Soua Lenk, OTR/L 10/30/2022, 4:12 PM

## 2022-10-30 NOTE — TOC Progression Note (Signed)
Transition of Care [facility]) - Progression Note    Patient Details  Name: [name] MRN: [id number] Date of Birth: 07-11-1986  [facility] of [facility]) CM/SW Contact  Leeroy Cha, RN Phone Number: 10/30/2022, 7:49 AM  Clinical Narrative:    Rolling walker ordered for patient through [employer]   Expected Discharge Plan: Home/Self Care Barriers to Discharge: Continued Medical Work up  Expected Discharge Plan and Services   Discharge Planning Services: CM Consult   Living arrangements for the past 2 months: Single Family Home                                       Social Determinants of Health (SDOH) Interventions SDOH Screenings   Food Insecurity: No Food Insecurity (10/25/2022)  Housing: Low Risk  (10/25/2022)  Transportation Needs: No Transportation Needs (10/25/2022)  Utilities: Not At Risk (10/25/2022)  Depression (PHQ2-9): Low Risk  (05/21/2021)  Tobacco Use: Low Risk  (10/25/2022)    Readmission Risk Interventions   No data to display

## 2022-10-30 NOTE — Progress Notes (Signed)
PROGRESS NOTE    [name]  RSW:[id number] DOB: 01-May-1986 DOA: 10/25/2022 PCP: Lilian Coma., MD   Brief Narrative: [name] is a 37 y.o. male with a history of alcoholic liver cirrhosis, portal hypertension, congestive splenomegaly, hyperammonemia, elevated AFP, coagulopathy, alcohol use, hypertension, GERD, prediabetes, obesity.  Patient presented secondary to abnormal hemoglobin of 5.1 by PCPs office and found to have acute anemia with hemoglobin of 4.9 on admission.  GI consulted for concern of possible upper GI bleeding secondary to risk factors of cirrhosis and portal hypertension, however patient did not have any history of hemoptysis, melena or hematochezia.  Patient managed symptomatically with blood transfusion.  GI with recommendation for no inpatient endoscopic procedure.   Assessment and Plan:  Severe anemia Unclear etiology. Presumed secondary to GI bleeding, however no obvious evidence of acute hemorrhage noted. FOBT negative, as well. Iron panel not suggestive of iron deficiency and picture is complicated by known cirrhosis with portal hypertension and gastric/esophageal varices. GI consulted and have now signed off. Hemoglobin trended back down below 7 today. Reticulocyte count elevated. -Transfuse 1 unit of PRBC today -Check total/indirect/direct bilirubin, haptoglobin and LDH to ensure not driven by possible hemolysis  Alcoholic withdrawal Patient managed with CIWA protocol. Withdrawal symptoms mostly resolved.  Hepatic encephalopathy Mental status stable. Patient reports having about 3 bowel movements per day. -Continue lactulose  Alcoholic liver disease Gastric/esophageal varices Splenomegaly Patient states that he drinks intermittently. We discussed reasons for drinking, but he was unable to identify. Patient was recently referred to outpatient GI with [place] but has yet to follow-up. Patient has significant liver impairment. Child class C. Patient  states he does not want to enroll in inpatient program for alcohol. We discussed importance for close GI follow-up and stressed complete cessation of alcohol use.  Gingivitis Per documentation, dentistry, Dr. Haig Prophet, evaluated and recommended outpatient follow-up.  Folic acid deficiency Secondary to alcohol use. -Continue folic acid  Portal hypertension IR consulted for consideration of TIPS. Per IR, not currently a candidate for TIPS procedure.   Hypokalemia Mild with potassium as low as 2.9. Improved with potassium supplementation.  Lower extremity edema Likely related to poor albumin from liver disease. LE venous duplex obtained and negative for acute LE DVT bilateral  GERD -Continue Protonix  Obesity Estimated body mass index is 31.29 kg/m as calculated from the following:   Height as of this encounter: 5\' 9"  (1.753 m).   Weight as of this encounter: 96.1 kg.  Weakness Left hip pain X-ray without evidence of fracture. PT consulted and recommended home health PT services.  DVT prophylaxis: SCDs Code Status:   Code Status: Full Code Family Communication: None at bedside. Patient declined for me to update family at this time. Disposition Plan: Discharge home possibly in 1-2 days if hemoglobin remains stable         Consultants:  [facility]  Procedures:  None  Antimicrobials: None   Subjective: Patient reports no issues this morning or overnight. Patient reports multiple bowel movements but no evidence of melena or hematochezia. No other concerns.  Objective: BP 135/74   Pulse 95   Temp 99.8 F (37.7 C) (Axillary)   Resp (!) 24   Ht 5\' 9"  (1.753 m)   Wt 96.1 kg   SpO2 100%   BMI 31.29 kg/m   Examination:  General exam: Appears calm and comfortable Respiratory system: Clear to auscultation. Respiratory effort normal. Cardiovascular system: S1 & S2 heard, RRR. No murmurs, rubs, gallops or clicks. Gastrointestinal  system: Abdomen is  nondistended, soft and nontender. Normal bowel sounds heard. Central nervous system: Alert and oriented. No focal neurological deficits. Musculoskeletal: No edema. No calf tenderness Skin: No cyanosis. No rashes Psychiatry: Judgement and insight appear normal. Mood & affect appropriate.    Data Reviewed: I have personally reviewed following labs and imaging studies  CBC Lab Results  Component Value Date   WBC 5.4 10/30/2022   RBC 2.68 (L) 10/30/2022   HGB 6.6 (LL) 10/30/2022   HCT 22.1 (L) 10/30/2022   MCV 82.5 10/30/2022   MCH 25.0 (L) 10/30/2022   PLT 150 10/30/2022   MCHC 30.3 10/30/2022   RDW 23.3 (H) 10/30/2022   LYMPHSABS 0.9 10/25/2022   MONOABS 0.7 10/25/2022   EOSABS 0.3 10/25/2022   BASOSABS 0.1 40/98/1191     Last metabolic panel Lab Results  Component Value Date   NA 133 (L) 10/29/2022   K 3.6 10/29/2022   CL 103 10/29/2022   CO2 22 10/29/2022   BUN 8 10/29/2022   CREATININE 0.70 10/29/2022   GLUCOSE 120 (H) 10/29/2022   GFRNONAA >60 10/29/2022   CALCIUM 7.8 (L) 10/29/2022   PHOS 3.2 10/25/2022   PROT 7.8 10/29/2022   ALBUMIN 2.1 (L) 10/29/2022   BILITOT 11.4 (H) 10/29/2022   ALKPHOS 69 10/29/2022   AST 78 (H) 10/29/2022   ALT 20 10/29/2022   ANIONGAP 8 10/29/2022    GFR: Estimated Creatinine Clearance: 143.3 mL/min (by C-G formula based on SCr of 0.7 mg/dL).  No results found for this or any previous visit (from the past 240 hour(s)).    Radiology Studies: DG HIP UNILAT WITH PELVIS 2-3 VIEWS LEFT  Result Date: 10/29/2022 CLINICAL DATA:  Left hip pain. EXAM: DG HIP (WITH OR WITHOUT PELVIS) 2-3V LEFT COMPARISON:  CT abdomen pelvis 04/26/2021 FINDINGS: Normal bone mineralization. The bilateral sacroiliac, bilateral femoroacetabular, and pubic symphysis joint spaces are maintained. No acute fracture or dislocation. IMPRESSION: Normal left hip radiographs. Electronically Signed   By: Yvonne Kendall M.D.   On: 10/29/2022 13:24      LOS: 3 days     Cordelia Poche, MD [facility] 10/30/2022, 2:06 PM   If 7PM-7AM, please contact night-coverage [email or web address]

## 2022-10-31 DIAGNOSIS — D649 Anemia, unspecified: Secondary | ICD-10-CM | POA: Diagnosis not present

## 2022-10-31 DIAGNOSIS — D509 Iron deficiency anemia, unspecified: Secondary | ICD-10-CM

## 2022-10-31 DIAGNOSIS — D696 Thrombocytopenia, unspecified: Secondary | ICD-10-CM

## 2022-10-31 DIAGNOSIS — E669 Obesity, unspecified: Secondary | ICD-10-CM | POA: Diagnosis not present

## 2022-10-31 DIAGNOSIS — I10 Essential (primary) hypertension: Secondary | ICD-10-CM

## 2022-10-31 DIAGNOSIS — K7030 Alcoholic cirrhosis of liver without ascites: Secondary | ICD-10-CM

## 2022-10-31 LAB — BPAM RBC
Blood Product Expiration Date: 202403062359
ISSUE DATE / TIME: 202401311005
Unit Type and Rh: 5100

## 2022-10-31 LAB — CBC
HCT: 24.7 % — ABNORMAL LOW (ref 39.0–52.0)
Hemoglobin: 7.4 g/dL — ABNORMAL LOW (ref 13.0–17.0)
MCH: 25 pg — ABNORMAL LOW (ref 26.0–34.0)
MCHC: 30 g/dL (ref 30.0–36.0)
MCV: 83.4 fL (ref 80.0–100.0)
Platelets: 153 10*3/uL (ref 150–400)
RBC: 2.96 MIL/uL — ABNORMAL LOW (ref 4.22–5.81)
RDW: 22.1 % — ABNORMAL HIGH (ref 11.5–15.5)
WBC: 5.3 10*3/uL (ref 4.0–10.5)
nRBC: 0.4 % — ABNORMAL HIGH (ref 0.0–0.2)

## 2022-10-31 LAB — TYPE AND SCREEN
ABO/RH(D): O POS
Antibody Screen: NEGATIVE
Unit division: 0

## 2022-10-31 LAB — HEMOGLOBIN AND HEMATOCRIT, BLOOD
HCT: 24 % — ABNORMAL LOW (ref 39.0–52.0)
Hemoglobin: 7.3 g/dL — ABNORMAL LOW (ref 13.0–17.0)

## 2022-10-31 LAB — OCCULT BLOOD X 1 CARD TO LAB, STOOL: Fecal Occult Bld: NEGATIVE

## 2022-10-31 MED ORDER — PHYTONADIONE 5 MG PO TABS
10.0000 mg | ORAL_TABLET | Freq: Once | ORAL | Status: AC
  Administered 2022-10-31: 10 mg via ORAL
  Filled 2022-10-31: qty 2

## 2022-10-31 NOTE — Progress Notes (Signed)
[name] 12:01 PM  Subjective: Patient seen and case discussed with his fiance and the hospital team and he is doing much better from a mental status encephalopathy withdrawal standpoint and he is currently walking the halls with physical therapy and is continue to drop his hemoglobin despite no obvious signs of bleeding except for a nosebleed and he had a bowel movement this morning which was brown and he has no other complaints  Objective: Vital signs stable afebrile no acute distress patient not examined today will be tomorrow prior to his endoscopy  Assessment: Cirrhosis with recurrent anemia  Plan: Since we had planned an outpatient endoscopy we will go ahead and proceed tomorrow with further workup and plans pending those findings  [facility]  office [phone] After 5PM or if no answer call [phone]

## 2022-10-31 NOTE — Progress Notes (Signed)
PROGRESS NOTE    Timoty Bourke  TMA:[id number] DOB: 1986-02-13 DOA: 10/25/2022 PCP: Lilian Coma., MD   Brief Narrative: [name] is a 37 y.o. male with a history of alcoholic liver cirrhosis, portal hypertension, congestive splenomegaly, hyperammonemia, elevated AFP, coagulopathy, alcohol use, hypertension, GERD, prediabetes, obesity.  Patient presented secondary to abnormal hemoglobin of 5.1 by PCPs office and found to have acute anemia with hemoglobin of 4.9 on admission.  GI consulted for concern of possible upper GI bleeding secondary to risk factors of cirrhosis and portal hypertension, however patient did not have any history of hemoptysis, melena or hematochezia.  Patient managed symptomatically with blood transfusion.  GI with recommendation for no inpatient endoscopic procedure.   Assessment and Plan:  Severe anemia Unclear etiology. Presumed secondary to GI bleeding, however no obvious evidence of acute hemorrhage noted. Hemoglobin of 4.9 on admission requiring a total of 4 units of PRBC (1/26, 1/27) to bring hemoglobin up to 7.2  FOBT negative. Iron panel not suggestive of iron deficiency and picture is complicated by known cirrhosis with portal hypertension and gastric/esophageal varices. GI consulted, recommending upper endoscopy and signed off on 1/30. Patient with hemoglobin trended back down below 7 on 1/31 requiring 1 unit of PRBC to achieve a post-transfusion hemoglobin of 8.1, now down to 7.4 today. Reticulocyte count elevated. Bilirubin studies suggest unlikely hemolysis pattern. -H&H this evening, transfuse for hemoglobin <7 -Follow-up haptoglobin -Re-consult GI  Alcoholic withdrawal Patient managed with CIWA protocol. Withdrawal symptoms mostly resolved.  Hepatic encephalopathy Mental status stable. Patient reports having about 3 bowel movements per day. -Continue lactulose  Alcoholic liver disease Gastric/esophageal varices Splenomegaly Patient states  that he drinks intermittently. We discussed reasons for drinking, but he was unable to identify. Patient was recently referred to outpatient GI with [place] but has yet to follow-up. Patient has significant liver impairment. Child class C. Patient states he does not want to enroll in inpatient program for alcohol. We discussed importance for close GI follow-up and stressed complete cessation of alcohol use.  Gingivitis Per documentation, dentistry, Dr. Haig Prophet, evaluated and recommended outpatient follow-up.  Folic acid deficiency Secondary to alcohol use. -Continue folic acid  Portal hypertension IR consulted for consideration of TIPS. Per IR, not currently a candidate for TIPS procedure.   Hypokalemia Mild with potassium as low as 2.9. Improved with potassium supplementation.  Lower extremity edema Likely related to poor albumin from liver disease. LE venous duplex obtained and negative for acute LE DVT bilateral  Epistaxis Episode this morning. Resolved. -Vitamin K 10 mg x1  GERD -Continue Protonix  Obesity Estimated body mass index is 31.29 kg/m as calculated from the following:   Height as of this encounter: 5\' 9"  (1.753 m).   Weight as of this encounter: 96.1 kg.  Weakness Left hip pain X-ray without evidence of fracture. PT consulted and recommended home health PT services.  DVT prophylaxis: SCDs Code Status:   Code Status: Full Code Family Communication: Fiance at bedside Disposition Plan: Discharge home possibly in 1-2 days if hemoglobin remains stable and pending GI management   Consultants:  [facility]  Procedures:  None  Antimicrobials: None   Subjective: Patient/nursing reports epistaxis episode this morning which is now resolved. No other concerns.  Objective: BP 132/79 (BP Location: Right Arm)   Pulse 84   Temp 98.5 F (36.9 C) (Oral)   Resp 18   Ht 5\' 9"  (1.753 m)   Wt 96.1 kg   SpO2 100%   BMI 31.29  kg/m    Examination:  General exam: Appears calm and comfortable  Respiratory system: Clear to auscultation. Respiratory effort normal. Cardiovascular system: S1 & S2 heard, RRR.Systolic murmur Gastrointestinal system: Abdomen is nondistended, soft and nontender. Normal bowel sounds heard. Central nervous system: Alert and oriented. No focal neurological deficits. Musculoskeletal: LE edema. No calf tenderness Skin: No cyanosis. No rashes Psychiatry: Judgement and insight appear normal. Mood & affect appropriate.    Data Reviewed: I have personally reviewed following labs and imaging studies  CBC Lab Results  Component Value Date   WBC 5.3 10/31/2022   RBC 2.96 (L) 10/31/2022   HGB 7.4 (L) 10/31/2022   HCT 24.7 (L) 10/31/2022   MCV 83.4 10/31/2022   MCH 25.0 (L) 10/31/2022   PLT 153 10/31/2022   MCHC 30.0 10/31/2022   RDW 22.1 (H) 10/31/2022   LYMPHSABS 0.9 10/25/2022   MONOABS 0.7 10/25/2022   EOSABS 0.3 10/25/2022   BASOSABS 0.1 61/44/3154     Last metabolic panel Lab Results  Component Value Date   NA 133 (L) 10/29/2022   K 3.6 10/29/2022   CL 103 10/29/2022   CO2 22 10/29/2022   BUN 8 10/29/2022   CREATININE 0.70 10/29/2022   GLUCOSE 120 (H) 10/29/2022   GFRNONAA >60 10/29/2022   CALCIUM 7.8 (L) 10/29/2022   PHOS 3.2 10/25/2022   PROT 7.8 10/29/2022   ALBUMIN 2.1 (L) 10/29/2022   BILITOT 12.1 (H) 10/30/2022   ALKPHOS 69 10/29/2022   AST 78 (H) 10/29/2022   ALT 20 10/29/2022   ANIONGAP 8 10/29/2022    GFR: Estimated Creatinine Clearance: 143.3 mL/min (by C-G formula based on SCr of 0.7 mg/dL).  No results found for this or any previous visit (from the past 240 hour(s)).    Radiology Studies: DG HIP UNILAT WITH PELVIS 2-3 VIEWS LEFT  Result Date: 10/29/2022 CLINICAL DATA:  Left hip pain. EXAM: DG HIP (WITH OR WITHOUT PELVIS) 2-3V LEFT COMPARISON:  CT abdomen pelvis 04/26/2021 FINDINGS: Normal bone mineralization. The bilateral sacroiliac, bilateral  femoroacetabular, and pubic symphysis joint spaces are maintained. No acute fracture or dislocation. IMPRESSION: Normal left hip radiographs. Electronically Signed   By: Yvonne Kendall M.D.   On: 10/29/2022 13:24      LOS: 4 days    Cordelia Poche, MD [facility] 10/31/2022, 12:44 PM   If 7PM-7AM, please contact night-coverage [email or web address]

## 2022-10-31 NOTE — Progress Notes (Signed)
Physical Therapy Treatment Patient Details Name: [name] MRN: [id number] DOB: [birth year] Today's Date: 10/31/2022   History of Present Illness Pt is a 37 y/o M admitted on 10/25/22 after presenting to the ED after his PCP referred him for evaluation of low Hgb. Hgb was found to be 4.9 & pt received 4 units of PRBC. PMH: alcoholic liver cirrhosis, portal HTN, variceal bleeding, congestive splenomegaly    PT Comments    Pt is progressing toward acute PT goals this session. Pt ambulated ~457ft with MIN guard progressing to supervision, intermittent episodes of instability without use of AD. Pt performed stair negotiation with MIN guard and use of single railing (3+10 steps). Pt, family, and RN encouraged for continued ambulation with supervision outside of therapy sessions. Pt reports feeling better/improve energy levels and has made progress with mobility- Pt requests additional PT session for continued stair training.       Recommendations for follow up therapy are one component of a multi-disciplinary discharge planning process, led by the attending physician.  Recommendations may be updated based on patient status, additional functional criteria and insurance authorization.  Follow Up Recommendations        Assistance Recommended at Discharge Intermittent Supervision/Assistance  Patient can return home with the following A little help with walking and/or transfers;A little help with bathing/dressing/bathroom;Assistance with cooking/housework;Assist for transportation;Help with stairs or ramp for entrance;Direct supervision/assist for medications management;Direct supervision/assist for financial management   Equipment Recommendations  Rolling walker (2 wheels)    Recommendations for Other Services       Precautions / Restrictions Precautions Precautions: Fall Restrictions Weight Bearing Restrictions: No     Mobility  Bed Mobility Overal bed mobility: Needs Assistance Bed  Mobility: Supine to Sit     Supine to sit: Supervision, HOB elevated     General bed mobility comments: HOB elevated 37deg    Transfers Overall transfer level: Needs assistance Equipment used: Rolling walker (2 wheels) Transfers: Sit to/from Stand Sit to Stand: Supervision           General transfer comment: cues for hand placement with use of RW. Pt initially holding RW in air and walking with it, cues to maintain RW/wheels in contact with the floor at all times.    Ambulation/Gait Ambulation/Gait assistance: Min guard, Supervision Gait Distance (Feet): 400 Feet Assistive device: Rolling walker (2 wheels), None Gait Pattern/deviations: Decreased step length - right, Decreased step length - left, Decreased stride length, Drifts right/left Gait velocity: WNL     General Gait Details: ambulated ~248ft with use of RW and 237ft without use of RW. intermittent bumping into therapist with drifitng L/R, noted increased instability with vertical/horizontal head turns during ambulation without AD.   Stairs Stairs: Yes Stairs assistance: Min guard Stair Management: One rail Right, Alternating pattern, Forwards Number of Stairs: 13 General stair comments: slow speed and intermittent decreased eccentric control observed when descending stairs. Increased LE muscle fatigue.   Wheelchair Mobility    Modified Rankin (Stroke Patients Only)       Balance Overall balance assessment: Needs assistance Sitting-balance support: Feet supported Sitting balance-Leahy Scale: Good     Standing balance support: No upper extremity supported, During functional activity Standing balance-Leahy Scale: Good                              Cognition Arousal/Alertness: Awake/alert Behavior During Therapy: Flat affect Overall Cognitive Status: Impaired/Different from baseline  Following Commands: Follows one step commands consistently     Problem  Solving: Requires verbal cues, Slow processing General Comments: increased time for processing        Exercises      General Comments        Pertinent Vitals/Pain Pain Assessment Pain Assessment: No/denies pain    Home Living                          Prior Function            PT Goals (current goals can now be found in the care plan section) Acute Rehab PT Goals Patient Stated Goal: get better PT Goal Formulation: With patient Time For Goal Achievement: 11/12/22 Potential to Achieve Goals: Good Progress towards PT goals: Progressing toward goals    Frequency    Min 3X/week      PT Plan Current plan remains appropriate    Co-evaluation              AM-PAC PT "6 Clicks" Mobility   Outcome Measure  Help needed turning from your back to your side while in a flat bed without using bedrails?: None Help needed moving from lying on your back to sitting on the side of a flat bed without using bedrails?: A Little Help needed moving to and from a bed to a chair (including a wheelchair)?: A Little   Help needed to walk in hospital room?: A Little Help needed climbing 3-5 steps with a railing? : A Little 6 Click Score: 16    End of Session Equipment Utilized During Treatment: Gait belt Activity Tolerance: Patient tolerated treatment well Patient left: with call bell/phone within reach;with family/visitor present (in bathroom- RN aware and fiance present in room) Nurse Communication: Mobility status PT Visit Diagnosis: Other abnormalities of gait and mobility (R26.89);Unsteadiness on feet (R26.81);Difficulty in walking, not elsewhere classified (R26.2);Muscle weakness (generalized) (M62.81)     Time: 0037-0488 PT Time Calculation (min) (ACUTE ONLY): 27 min  Charges:  $Therapeutic Activity: 23-37 mins                     Festus Barren PT, DPT  Acute Rehabilitation Services  Office [phone]   10/31/2022, 12:26 PM

## 2022-11-01 ENCOUNTER — Encounter (HOSPITAL_COMMUNITY): Payer: Self-pay | Admitting: Internal Medicine

## 2022-11-01 ENCOUNTER — Inpatient Hospital Stay (HOSPITAL_COMMUNITY): Payer: Medicaid Other | Admitting: Certified Registered Nurse Anesthetist

## 2022-11-01 ENCOUNTER — Encounter (HOSPITAL_COMMUNITY)
Admission: EM | Disposition: A | Discharge status: Home / Self Care | Payer: Self-pay | Source: Home / Self Care | Attending: Internal Medicine

## 2022-11-01 DIAGNOSIS — D509 Iron deficiency anemia, unspecified: Secondary | ICD-10-CM | POA: Diagnosis not present

## 2022-11-01 DIAGNOSIS — D649 Anemia, unspecified: Secondary | ICD-10-CM

## 2022-11-01 DIAGNOSIS — E669 Obesity, unspecified: Secondary | ICD-10-CM | POA: Diagnosis not present

## 2022-11-01 DIAGNOSIS — K7030 Alcoholic cirrhosis of liver without ascites: Secondary | ICD-10-CM | POA: Diagnosis not present

## 2022-11-01 DIAGNOSIS — K7460 Unspecified cirrhosis of liver: Secondary | ICD-10-CM

## 2022-11-01 DIAGNOSIS — I10 Essential (primary) hypertension: Secondary | ICD-10-CM

## 2022-11-01 DIAGNOSIS — I8510 Secondary esophageal varices without bleeding: Secondary | ICD-10-CM

## 2022-11-01 HISTORY — PX: ESOPHAGOGASTRODUODENOSCOPY (EGD) WITH PROPOFOL: SHX5813

## 2022-11-01 LAB — COMPREHENSIVE METABOLIC PANEL
ALT: 23 U/L (ref 0–44)
AST: 72 U/L — ABNORMAL HIGH (ref 15–41)
Albumin: 1.8 g/dL — ABNORMAL LOW (ref 3.5–5.0)
Alkaline Phosphatase: 78 U/L (ref 38–126)
Anion gap: 6 (ref 5–15)
BUN: 6 mg/dL (ref 6–20)
CO2: 21 mmol/L — ABNORMAL LOW (ref 22–32)
Calcium: 8.1 mg/dL — ABNORMAL LOW (ref 8.9–10.3)
Chloride: 108 mmol/L (ref 98–111)
Creatinine, Ser: 0.5 mg/dL — ABNORMAL LOW (ref 0.61–1.24)
GFR, Estimated: >60 mL/min (ref >60)
Glucose, Bld: 91 mg/dL (ref 70–99)
Potassium: 3.6 mmol/L (ref 3.5–5.1)
Sodium: 135 mmol/L (ref 135–145)
Total Bilirubin: 10.7 mg/dL — ABNORMAL HIGH (ref 0.3–1.2)
Total Protein: 7.3 g/dL (ref 6.5–8.1)

## 2022-11-01 LAB — CBC
HCT: 23.7 % — ABNORMAL LOW (ref 39.0–52.0)
Hemoglobin: 7.2 g/dL — ABNORMAL LOW (ref 13.0–17.0)
MCH: 25.1 pg — ABNORMAL LOW (ref 26.0–34.0)
MCHC: 30.4 g/dL (ref 30.0–36.0)
MCV: 82.6 fL (ref 80.0–100.0)
Platelets: 151 10*3/uL (ref 150–400)
RBC: 2.87 MIL/uL — ABNORMAL LOW (ref 4.22–5.81)
RDW: 21.9 % — ABNORMAL HIGH (ref 11.5–15.5)
WBC: 5.7 10*3/uL (ref 4.0–10.5)
nRBC: 0 % (ref 0.0–0.2)

## 2022-11-01 LAB — PROTIME-INR
INR: 2.6 — ABNORMAL HIGH (ref 0.8–1.2)
Prothrombin Time: 27.9 seconds — ABNORMAL HIGH (ref 11.4–15.2)

## 2022-11-01 LAB — HAPTOGLOBIN: Haptoglobin: <10 mg/dL — ABNORMAL LOW (ref 17–317)

## 2022-11-01 SURGERY — ESOPHAGOGASTRODUODENOSCOPY (EGD) WITH PROPOFOL
Anesthesia: Monitor Anesthesia Care

## 2022-11-01 MED ORDER — SODIUM CHLORIDE 0.9 % IV SOLN: INTRAVENOUS | Status: DC

## 2022-11-01 MED ORDER — PROPOFOL 500 MG/50ML IV EMUL
INTRAVENOUS | Status: AC
  Filled 2022-11-01: qty 50

## 2022-11-01 MED ORDER — LIDOCAINE 2% (20 MG/ML) 5 ML SYRINGE
INTRAMUSCULAR | Status: DC | PRN
  Administered 2022-11-01: 50 mg via INTRAVENOUS

## 2022-11-01 MED ORDER — PROPOFOL 10 MG/ML IV BOLUS
INTRAVENOUS | Status: DC | PRN
  Administered 2022-11-01: 50 mg via INTRAVENOUS

## 2022-11-01 MED ORDER — PROPRANOLOL HCL 10 MG PO TABS
10.0000 mg | ORAL_TABLET | Freq: Two times a day (BID) | ORAL | Status: DC
  Administered 2022-11-01 – 2022-11-02 (×2): 10 mg via ORAL
  Filled 2022-11-01 (×2): qty 1

## 2022-11-01 MED ORDER — PROPOFOL 1000 MG/100ML IV EMUL
INTRAVENOUS | Status: AC
  Filled 2022-11-01: qty 100

## 2022-11-01 MED ORDER — PROPOFOL 500 MG/50ML IV EMUL
INTRAVENOUS | Status: DC | PRN
  Administered 2022-11-01: 150 ug/kg/min via INTRAVENOUS

## 2022-11-01 MED ORDER — LACTATED RINGERS IV SOLN: INTRAVENOUS | Status: DC

## 2022-11-01 SURGICAL SUPPLY — 15 items

## 2022-11-01 NOTE — Progress Notes (Signed)
Physical Therapy Treatment Patient Details Name: [name] MRN: [id number] DOB: 05/25/1986 Today's Date: 11/01/2022   History of Present Illness Pt is a 37 y/o M admitted on 10/25/22 after presenting to the ED after his PCP referred him for evaluation of low Hgb. Hgb was found to be 4.9 & pt received 4 units of PRBC. PMH: alcoholic liver cirrhosis, portal HTN, variceal bleeding, congestive splenomegaly    PT Comments    Pt demonstrating good improvements.  He has been transferring and ambulating independently in room.  Pt was able to ambulate 400' with therapy without AD, steady gait, accepted balance challenges.  He was also able to do flight of steps with steady balance and good control.   Pt has met goals and no longer requires acute PT services.  Also, updated to no PT needs at d/c due to good progress.    Recommendations for follow up therapy are one component of a multi-disciplinary discharge planning process, led by the attending physician.  Recommendations may be updated based on patient status, additional functional criteria and insurance authorization.  Follow Up Recommendations  No PT follow up     Assistance Recommended at Discharge PRN  Patient can return home with the following     Equipment Recommendations  None recommended by PT    Recommendations for Other Services       Precautions / Restrictions Precautions Precautions: Fall     Mobility  Bed Mobility Overal bed mobility: Independent                  Transfers Overall transfer level: Independent                 General transfer comment: Pt has been ambulating in room independently without AD.  Demonstrated transfers safely    Ambulation/Gait Ambulation/Gait assistance: Modified independent (Device/Increase time) Gait Distance (Feet): 400 Feet Assistive device: None Gait Pattern/deviations: WFL(Within Functional Limits) Gait velocity: WNL     General Gait Details: Supervision for  treatment but has been ambulating in room without AD.  Demonstrated safely and steady gait   Stairs Stairs: Yes Stairs assistance: Supervision Stair Management: One rail Right, Alternating pattern, Forwards Number of Stairs: 12 General stair comments: Steady pattern; good balance and control   Wheelchair Mobility    Modified Rankin (Stroke Patients Only)       Balance Overall balance assessment: Needs assistance Sitting-balance support: Feet supported, No upper extremity supported Sitting balance-Leahy Scale: Normal     Standing balance support: No upper extremity supported Standing balance-Leahy Scale: Good               High level balance activites: Side stepping, Backward walking, Direction changes, Turns, Sudden stops, Head turns High Level Balance Comments: able to do above without LOB            Cognition Arousal/Alertness: Awake/alert Behavior During Therapy: Flat affect Overall Cognitive Status: Within Functional Limits for tasks assessed                                 General Comments: min cues for safety (tried donning shorts in standing -advised sitting due to grip socks)        Exercises      General Comments        Pertinent Vitals/Pain Pain Assessment Pain Assessment: No/denies pain    Home Living  Prior Function            PT Goals (current goals can now be found in the care plan section) Acute Rehab PT Goals PT Goal Formulation: All assessment and education complete, DC therapy Progress towards PT goals: Goals met/education completed, patient discharged from PT    Frequency           PT Plan Discharge plan needs to be updated    Co-evaluation              AM-PAC PT "6 Clicks" Mobility   Outcome Measure  Help needed turning from your back to your side while in a flat bed without using bedrails?: None Help needed moving from lying on your back to sitting on the  side of a flat bed without using bedrails?: None Help needed moving to and from a bed to a chair (including a wheelchair)?: None Help needed standing up from a chair using your arms (e.g., wheelchair or bedside chair)?: None Help needed to walk in hospital room?: None Help needed climbing 3-5 steps with a railing? : A Little 6 Click Score: 23    End of Session   Activity Tolerance: Patient tolerated treatment well Patient left: in chair;with call bell/phone within reach (has been mobilizing independently) Nurse Communication: Mobility status PT Visit Diagnosis: Other abnormalities of gait and mobility (R26.89);Unsteadiness on feet (R26.81);Difficulty in walking, not elsewhere classified (R26.2);Muscle weakness (generalized) (M62.81)     Time: 9470-9628 PT Time Calculation (min) (ACUTE ONLY): 11 min  Charges:  $Gait Training: 8-22 mins                     Abran Richard, PT Acute Rehab [facility] [place] 11/01/2022, 2:59 PM

## 2022-11-01 NOTE — Progress Notes (Signed)
PROGRESS NOTE    [name]  ZTI:[id number] DOB: Jun 14, 1986 DOA: 10/25/2022 PCP: Lilian Coma., MD   Brief Narrative: [name] is a 37 y.o. male with a history of alcoholic liver cirrhosis, portal hypertension, congestive splenomegaly, hyperammonemia, elevated AFP, coagulopathy, alcohol use, hypertension, GERD, prediabetes, obesity.  Patient presented secondary to abnormal hemoglobin of 5.1 by PCPs office and found to have acute anemia with hemoglobin of 4.9 on admission.  GI consulted for concern of possible upper GI bleeding secondary to risk factors of cirrhosis and portal hypertension, however patient did not have any history of hemoptysis, melena or hematochezia.  Patient managed symptomatically with blood transfusion.  GI with recommendation for no inpatient endoscopic procedure.   Assessment and Plan:  Severe anemia Unclear etiology. Presumed secondary to GI bleeding, however no obvious evidence of acute hemorrhage noted. Hemoglobin of 4.9 on admission requiring a total of 4 units of PRBC (1/26, 1/27) to bring hemoglobin up to 7.2  FOBT negative. Iron panel not suggestive of iron deficiency and picture is complicated by known cirrhosis with portal hypertension and gastric/esophageal varices. GI consulted, recommending upper endoscopy and signed off on 1/30. Patient with hemoglobin trended back down below 7 on 1/31 requiring 1 unit of PRBC to achieve a post-transfusion hemoglobin of 8.1, now down to 7.2. Reticulocyte count elevated. Bilirubin studies suggest unlikely hemolysis pattern. -Daily CBC; transfuse for hemoglobin <7 -Follow-up haptoglobin -GI recommendations: upper endoscopy today  Alcoholic withdrawal Patient managed with CIWA protocol. Withdrawal symptoms mostly resolved.  Hepatic encephalopathy Mental status stable. Patient reports having about 3 bowel movements per day. -Continue lactulose  Alcoholic liver disease Gastric/esophageal  varices Splenomegaly Patient states that he drinks intermittently. We discussed reasons for drinking, but he was unable to identify. Patient was recently referred to outpatient GI with [place] but has yet to follow-up. Patient has significant liver impairment. Child class C. Patient states he does not want to enroll in inpatient program for alcohol. We discussed importance for close GI follow-up and stressed complete cessation of alcohol use.  Gingivitis Per documentation, dentistry, Dr. Haig Prophet, evaluated and recommended outpatient follow-up.  Folic acid deficiency Secondary to alcohol use. -Continue folic acid  Portal hypertension IR consulted for consideration of TIPS. Per IR, not currently a candidate for TIPS procedure.   Hypokalemia Mild with potassium as low as 2.9. Improved with potassium supplementation.  Lower extremity edema Likely related to poor albumin from liver disease. LE venous duplex obtained and negative for acute LE DVT bilateral  Epistaxis Episode 2/1. Resolved. Vitamin K given.  GERD -Continue Protonix  Obesity Estimated body mass index is 31.29 kg/m as calculated from the following:   Height as of this encounter: 5\' 9"  (1.753 m).   Weight as of this encounter: 96.1 kg.  Weakness Left hip pain X-ray without evidence of fracture. PT consulted and recommended home health PT services.   DVT prophylaxis: SCDs Code Status:   Code Status: Full Code Family Communication: None at bedside Disposition Plan: Discharge home possibly in 1-2 days if hemoglobin remains stable and pending GI management   Consultants:  [facility]  Procedures:  None  Antimicrobials: None   Subjective: No issues this morning per patient.  Objective: BP (!) 157/74   Pulse 83   Temp 98.5 F (36.9 C) (Temporal)   Resp (!) 24   Ht 5\' 9"  (1.753 m)   Wt 96.1 kg   SpO2 99%   BMI 31.29 kg/m   Examination:  General exam: Appears calm and  comfortable Respiratory system: Clear to auscultation. Respiratory effort normal. Cardiovascular system: S1 & S2 heard, RRR. 2+ left DP and PD pulses Gastrointestinal system: Abdomen is nondistended, soft and nontender. Normal bowel sounds heard. Central nervous system: Alert and oriented. No focal neurological deficits. Musculoskeletal: No edema. No calf tenderness Skin: No cyanosis. Clean ulcer noted at left lower leg anteriorly, without surrounding erythema Psychiatry: Judgement and insight appear normal. Mood & affect appropriate.    Data Reviewed: I have personally reviewed following labs and imaging studies  CBC Lab Results  Component Value Date   WBC 5.7 11/01/2022   RBC 2.87 (L) 11/01/2022   HGB 7.2 (L) 11/01/2022   HCT 23.7 (L) 11/01/2022   MCV 82.6 11/01/2022   MCH 25.1 (L) 11/01/2022   PLT 151 11/01/2022   MCHC 30.4 11/01/2022   RDW 21.9 (H) 11/01/2022   LYMPHSABS 0.9 10/25/2022   MONOABS 0.7 10/25/2022   EOSABS 0.3 10/25/2022   BASOSABS 0.1 16/07/9603     Last metabolic panel Lab Results  Component Value Date   NA 135 11/01/2022   K 3.6 11/01/2022   CL 108 11/01/2022   CO2 21 (L) 11/01/2022   BUN 6 11/01/2022   CREATININE 0.50 (L) 11/01/2022   GLUCOSE 91 11/01/2022   GFRNONAA >60 11/01/2022   CALCIUM 8.1 (L) 11/01/2022   PHOS 3.2 10/25/2022   PROT 7.3 11/01/2022   ALBUMIN 1.8 (L) 11/01/2022   BILITOT 10.7 (H) 11/01/2022   ALKPHOS 78 11/01/2022   AST 72 (H) 11/01/2022   ALT 23 11/01/2022   ANIONGAP 6 11/01/2022    GFR: Estimated Creatinine Clearance: 143.3 mL/min (A) (by C-G formula based on SCr of 0.5 mg/dL (L)).  No results found for this or any previous visit (from the past 240 hour(s)).    Radiology Studies: No results found.    LOS: 5 days    Cordelia Poche, MD [facility] 11/01/2022, 10:57 AM   If 7PM-7AM, please contact night-coverage [email or web address]

## 2022-11-01 NOTE — Anesthesia Preprocedure Evaluation (Signed)
Anesthesia Evaluation  Patient identified by MRN, date of birth, ID band Patient awake    Reviewed: Allergy & Precautions, NPO status , Patient's Chart, lab work & pertinent test results  Airway Mallampati: II  TM Distance: >3 FB     Dental   Pulmonary neg pulmonary ROS   breath sounds clear to auscultation       Cardiovascular hypertension,  Rhythm:Regular Rate:Normal     Neuro/Psych negative neurological ROS     GI/Hepatic ,GERD  ,,(+) Cirrhosis     substance abuse  alcohol use  Endo/Other  negative endocrine ROS    Renal/GU negative Renal ROS     Musculoskeletal   Abdominal   Peds  Hematology  (+) Blood dyscrasia, anemia   Anesthesia Other Findings   Reproductive/Obstetrics                             Anesthesia Physical Anesthesia Plan  ASA: 4  Anesthesia Plan: MAC   Post-op Pain Management:    Induction:   PONV Risk Score and Plan: Propofol infusion  Airway Management Planned: Natural Airway and Nasal Cannula  Additional Equipment:   Intra-op Plan:   Post-operative Plan:   Informed Consent: I have reviewed the patients History and Physical, chart, labs and discussed the procedure including the risks, benefits and alternatives for the proposed anesthesia with the patient or authorized representative who has indicated his/her understanding and acceptance.       Plan Discussed with:   Anesthesia Plan Comments:        Anesthesia Quick Evaluation

## 2022-11-01 NOTE — Transfer of Care (Signed)
Immediate Anesthesia Transfer of Care Note  Patient: [name]  Procedure(s) Performed: ESOPHAGOGASTRODUODENOSCOPY (EGD) WITH PROPOFOL  Patient Location: Endoscopy Unit  Anesthesia Type:MAC  Level of Consciousness: awake and patient cooperative  Airway & Oxygen Therapy: Patient Spontanous Breathing and Patient connected to face mask  Post-op Assessment: Report given to RN and Post -op Vital signs reviewed and stable  Post vital signs: Reviewed and stable  Last Vitals:  Vitals Value Taken Time  BP    Temp    Pulse 88 11/01/22 1246  Resp 15 11/01/22 1246  SpO2 100 % 11/01/22 1246  Vitals shown include unvalidated device data.  Last Pain:  Vitals:   11/01/22 1023  TempSrc: Temporal  PainSc: 0-No pain         Complications: No notable events documented.

## 2022-11-01 NOTE — Anesthesia Procedure Notes (Signed)
Procedure Name: MAC Date/Time: 11/01/2022 12:20 PM  Performed by: Claudia Desanctis, CRNAPre-anesthesia Checklist: Patient identified, Emergency Drugs available, Suction available and Patient being monitored Patient Re-evaluated:Patient Re-evaluated prior to induction Oxygen Delivery Method: Simple face mask

## 2022-11-01 NOTE — Op Note (Signed)
[facility] Patient Name: [name] Procedure Date: 11/01/2022 MRN: [id number] Attending MD: Clarene Essex , MD, [id number] Date of Birth: April 08, 1986 CSN: [id number] Age: 37 Admit Type: Outpatient Procedure:                Upper GI endoscopy Indications:              Iron deficiency anemia secondary to chronic blood                            loss, Follow-up of esophageal varices Providers:                Clarene Essex, MD, Dulcy Fanny, Cherylynn Ridges,                            Technician, Dellie Catholic Referring MD:              Medicines:                Monitored Anesthesia Care Complications:            No immediate complications. Estimated Blood Loss:     Estimated blood loss: none. Procedure:                Pre-Anesthesia Assessment:                           - Prior to the procedure, a History and Physical                            was performed, and patient medications and                            allergies were reviewed. The patient's tolerance of                            previous anesthesia was also reviewed. The risks                            and benefits of the procedure and the sedation                            options and risks were discussed with the patient.                            All questions were answered, and informed consent                            was obtained. Prior Anticoagulants: The patient has                            taken no anticoagulant or antiplatelet agents. ASA                            Grade Assessment: III - A patient with severe  systemic disease. After reviewing the risks and                            benefits, the patient was deemed in satisfactory                            condition to undergo the procedure.                           After obtaining informed consent, the endoscope was                            passed under direct vision. Throughout the                             procedure, the patient's blood pressure, pulse, and                            oxygen saturations were monitored continuously. The                            GIF-[id number] ([id number]) Olympus endoscope was introduced                            through the mouth, and advanced to the third part                            of duodenum. The upper GI endoscopy was                            accomplished without difficulty. The patient                            tolerated the procedure well. Scope In: Scope Out: Findings:      The larynx was normal.      A [Size] tiny hiatal hernia was present.      Small (< 5 mm) varices were found in the lower third of the esophagus.      Varices with no bleeding were found in the cardia. There were no       stigmata of recent bleeding.      Mild portal hypertensive gastropathy was found in the gastric body.      A ?varix was found in the second portion of the duodenum versus bulbous       ampulla.      The duodenal bulb, first portion of the duodenum and third portion of       the duodenum were normal.      The exam was otherwise without abnormality. No signs of bleeding       throughout the procedure Impression:               - Normal larynx.                           - [Size] tiny hiatal hernia.                           -  Small (< 5 mm) esophageal varices.                           - Gastric varices, without bleeding.                           - Portal hypertensive gastropathy.                           - ?Duodenal varices.                           - Normal duodenal bulb, first portion of the                            duodenum and third portion of the duodenum.                           - The examination was otherwise normal.                           - No specimens collected. Moderate Sedation:      Not Applicable - Patient had care per Anesthesia. Recommendation:           - Soft diet today. May resume previous diet which                             probably includes low sodium and low animal protein                            diet                           - Continue present medications. Consider a trial of                            propranolol if he can tolerate it 10 or 20 twice                            daily to start and can adjust as an outpatient                            based on pulse blood pressure and side effects                           - Return to GI clinic in 4 weeks. Can consider                            inpatient alcoholic rehab                           - Telephone GI clinic if symptomatic PRN. Please                            call me this weekend if I  can be of any further                            assistance Procedure Code(s):        --- Professional ---                           [phone], Esophagogastroduodenoscopy, flexible,                            transoral; diagnostic, including collection of                            specimen(s) by brushing or washing, when performed                            (separate procedure) Diagnosis Code(s):        --- Professional ---                           K44.9, Diaphragmatic hernia without obstruction or                            gangrene                           I85.00, Esophageal varices without bleeding                           I86.4, Gastric varices                           K76.6, Portal hypertension                           K31.89, Other diseases of stomach and duodenum                           I86.8, Varicose veins of other specified sites                           D50.0, Iron deficiency anemia secondary to blood                            loss (chronic) CPT copyright 2022 [employer]. All rights reserved. The codes documented in this report are preliminary and upon coder review may  be revised to meet current compliance requirements. Vida Rigger, MD 11/01/2022 1:06:34 PM This report has been signed electronically. Number of Addenda: 0

## 2022-11-01 NOTE — Consult Note (Signed)
[facility] Nurse Consult Note: Reason for Consult: LLE wound Discussed and received a through assessment/report on LLE wound. Reported to be related to a fall but present for a while.  Has had some intermittent drainage. Patient is edematous.  Wound type:? Trauma Pressure Injury POA: NA Measurement:see nursing flow sheets Wound PQD:IYMEBRAX to be clean and pink Drainage (amount, consistency, odor) intermitting yellow/green drainage Periwound: edema  Dressing procedure/placement/frequency: Continue silver hydrofiber and foam topper to manage exudate and insulate wound bed.  Elevate legs as much as possible.   Discussed POC with patient and bedside nurse.  Re consult if needed, will not follow at this time. Thanks  Jennet Scroggin [employer], RN,CWOCN, CNS, [place] [phone])

## 2022-11-01 NOTE — Progress Notes (Signed)
[name] 12:20 PM  Subjective: Patient doing well without signs of bleeding no new complaints we rediscussed the procedure  Objective: Vital signs stable afebrile no acute distress exam please see preassessment evaluation chemistry stable hemoglobin stable  Assessment: Anemia cirrhosis  Plan: oK to proceed with endoscopy with anesthesia assistance  [facility]  office [phone] After 5PM or if no answer call [phone]

## 2022-11-01 NOTE — Plan of Care (Signed)
  Problem: Clinical Measurements: Goal: Ability to maintain clinical measurements within normal limits will improve Outcome: Progressing Goal: Diagnostic test results will improve Outcome: Progressing   Problem: Coping: Goal: Level of anxiety will decrease Outcome: Progressing   Problem: Safety: Goal: Ability to remain free from injury will improve Outcome: Progressing   

## 2022-11-02 ENCOUNTER — Other Ambulatory Visit (HOSPITAL_COMMUNITY): Payer: Self-pay

## 2022-11-02 DIAGNOSIS — D649 Anemia, unspecified: Secondary | ICD-10-CM | POA: Diagnosis not present

## 2022-11-02 LAB — COMPREHENSIVE METABOLIC PANEL
ALT: 23 U/L (ref 0–44)
AST: 77 U/L — ABNORMAL HIGH (ref 15–41)
Albumin: 1.9 g/dL — ABNORMAL LOW (ref 3.5–5.0)
Alkaline Phosphatase: 61 U/L (ref 38–126)
Anion gap: 7 (ref 5–15)
BUN: 6 mg/dL (ref 6–20)
CO2: 21 mmol/L — ABNORMAL LOW (ref 22–32)
Calcium: 8.3 mg/dL — ABNORMAL LOW (ref 8.9–10.3)
Chloride: 104 mmol/L (ref 98–111)
Creatinine, Ser: 0.52 mg/dL — ABNORMAL LOW (ref 0.61–1.24)
GFR, Estimated: >60 mL/min (ref >60)
Glucose, Bld: 92 mg/dL (ref 70–99)
Potassium: 3.9 mmol/L (ref 3.5–5.1)
Sodium: 132 mmol/L — ABNORMAL LOW (ref 135–145)
Total Bilirubin: 11.5 mg/dL — ABNORMAL HIGH (ref 0.3–1.2)
Total Protein: 7.7 g/dL (ref 6.5–8.1)

## 2022-11-02 LAB — CBC
HCT: 25.7 % — ABNORMAL LOW (ref 39.0–52.0)
Hemoglobin: 7.7 g/dL — ABNORMAL LOW (ref 13.0–17.0)
MCH: 25.4 pg — ABNORMAL LOW (ref 26.0–34.0)
MCHC: 30 g/dL (ref 30.0–36.0)
MCV: 84.8 fL (ref 80.0–100.0)
Platelets: 182 10*3/uL (ref 150–400)
RBC: 3.03 MIL/uL — ABNORMAL LOW (ref 4.22–5.81)
RDW: 21.9 % — ABNORMAL HIGH (ref 11.5–15.5)
WBC: 5.1 10*3/uL (ref 4.0–10.5)
nRBC: 0 % (ref 0.0–0.2)

## 2022-11-02 LAB — PROTIME-INR
INR: 2.4 — ABNORMAL HIGH (ref 0.8–1.2)
Prothrombin Time: 25.7 seconds — ABNORMAL HIGH (ref 11.4–15.2)

## 2022-11-02 MED ORDER — FUROSEMIDE 40 MG PO TABS
40.0000 mg | ORAL_TABLET | Freq: Every day | ORAL | 2 refills | Status: DC
  Filled 2022-11-02: qty 30, 30d supply, fill #0

## 2022-11-02 MED ORDER — VITAMIN B-1 100 MG PO TABS
100.0000 mg | ORAL_TABLET | Freq: Every day | ORAL | 0 refills | Status: AC
  Filled 2022-11-02: qty 100, 100d supply, fill #0

## 2022-11-02 MED ORDER — SPIRONOLACTONE 50 MG PO TABS
50.0000 mg | ORAL_TABLET | Freq: Every day | ORAL | 2 refills | Status: AC
  Filled 2022-11-02: qty 30, 30d supply, fill #0

## 2022-11-02 MED ORDER — FOLIC ACID 1 MG PO TABS
1.0000 mg | ORAL_TABLET | Freq: Every day | ORAL | 0 refills | Status: AC
  Filled 2022-11-02: qty 30, 30d supply, fill #0

## 2022-11-02 MED ORDER — LACTULOSE 10 GM/15ML PO SOLN
30.0000 g | Freq: Three times a day (TID) | ORAL | 0 refills | Status: AC
  Filled 2022-11-02: qty 4050, 30d supply, fill #0

## 2022-11-02 MED ORDER — PROPRANOLOL HCL 10 MG PO TABS
10.0000 mg | ORAL_TABLET | Freq: Two times a day (BID) | ORAL | 2 refills | Status: AC
  Filled 2022-11-02: qty 60, 30d supply, fill #0

## 2022-11-02 MED ORDER — PANTOPRAZOLE SODIUM 40 MG PO TBEC
40.0000 mg | DELAYED_RELEASE_TABLET | Freq: Two times a day (BID) | ORAL | 2 refills | Status: AC
  Filled 2022-11-02: qty 60, 30d supply, fill #0

## 2022-11-02 NOTE — Discharge Summary (Signed)
Physician Discharge Summary   Patient: [name] MRN: [id number] DOB: 03-10-1986  Admit date:     10/25/2022  Discharge date: 11/02/22  Discharge Physician: Prudence Davidson   PCP: Lilian Coma., MD   Recommendations at discharge:  Follow-up with PCP Follow-up with GI Haptoglobin is pending  Discharge Diagnoses: Principal Problem:   Symptomatic anemia Active Problems:   Essential hypertension   Alcoholic cirrhosis of liver without ascites (HCC)   Iron deficiency anemia   GERD (gastroesophageal reflux disease)   Hyperbilirubinemia   Thrombocytopenia (HCC)   Class 1 obesity   Hypokalemia   Prediabetes   Alcohol intoxication in active alcoholic without complication (HCC)   Alcohol withdrawal ([place])  Resolved Problems:   * No resolved hospital problems. *  Hospital Course: [name] is a 37 y.o. male with a history of alcoholic liver cirrhosis, portal hypertension, congestive splenomegaly, hyperammonemia, elevated AFP, coagulopathy, alcohol use, hypertension, GERD, prediabetes, obesity.  Patient presented secondary to abnormal hemoglobin of 5.1 by PCPs office and found to have acute anemia with hemoglobin of 4.9 on admission.  GI consulted for concern of possible upper GI bleeding secondary to risk factors of cirrhosis and portal hypertension, however patient did not have any history of hemoptysis, melena or hematochezia.  Patient managed symptomatically with blood transfusion.  GI with recommendation for no inpatient endoscopic procedure.  Assessment and Plan:  Severe anemia Unclear etiology. Presumed secondary to GI bleeding, however no obvious evidence of acute hemorrhage noted. Hemoglobin of 4.9 on admission requiring a total of 4 units of PRBC (1/26, 1/27) to bring hemoglobin up to 7.2  FOBT negative. Iron panel not suggestive of iron deficiency and picture is complicated by known cirrhosis with portal hypertension and gastric/esophageal varices. GI consulted,  recommending upper endoscopy and signed off on 1/30. Patient with hemoglobin trended back down below 7 on 1/31 requiring 1 unit of PRBC to achieve a post-transfusion hemoglobin of 8.1, now down to 7.2. Reticulocyte count elevated. Bilirubin studies suggest unlikely hemolysis pattern. Upper endoscopy performed on 2/2 with evidence of gastric varices and portal hypertension gastropathy but without active hemorrhage. Hemoglobin remained stable prior to discharge.   Alcoholic withdrawal Patient managed with CIWA protocol. Withdrawal symptoms mostly resolved.   Hepatic encephalopathy Mental status stable. Patient reports having about 3 bowel movements per day. Continue lactulose.   Alcoholic liver disease Gastric/esophageal varices Splenomegaly Patient states that he drinks intermittently. We discussed reasons for drinking, but he was unable to identify. Patient was recently referred to outpatient GI with [facility] but has yet to follow-up. Patient has significant liver impairment. Child class C. Patient states he does not want to enroll in inpatient program for alcohol but is going to attend outpatient resources. We discussed importance for close GI follow-up and stressed complete cessation of alcohol use.   Gingivitis Per documentation, dentistry, Dr. Haig Prophet, evaluated and recommended outpatient follow-up.   Folic acid deficiency Secondary to alcohol use. Continue folic acid.   Portal hypertension IR consulted for consideration of TIPS. Per IR, not currently a candidate for TIPS procedure.    Hypokalemia Mild with potassium as low as 2.9. Improved with potassium supplementation.   Lower extremity edema Likely related to poor albumin from liver disease. LE venous duplex obtained and negative for acute LE DVT bilateral   Epistaxis Episode 2/1. Resolved. Vitamin K given.   GERD Continue Protonix.   Obesity Estimated body mass index is 31.29 kg/m as calculated from the following:    Height as of this encounter:  5\' 9"  (1.753 m).   Weight as of this encounter: 96.1 kg.   Weakness Left hip pain X-ray without evidence of fracture. PT consulted and recommended home health PT services.  Consultants: Gastroenterology Procedures performed:  2/2: Upper endoscopy   Disposition: Home Diet recommendation: Low sodium diet   DISCHARGE MEDICATION: Allergies as of 11/02/2022   No Known Allergies      Medication List     TAKE these medications    ferrous sulfate 325 (65 FE) MG tablet Take 1 tablet (325 mg total) by mouth daily with breakfast.   folic acid 1 MG tablet Commonly known as: FOLVITE Take 1 tablet (1 mg total) by mouth daily. Start taking on: November 03, 2022   furosemide 40 MG tablet Commonly known as: LASIX Take 1 tablet (40 mg total) by mouth daily.   lactulose 10 GM/15ML solution Commonly known as: CHRONULAC Take 45 mLs (30 g total) by mouth 3 (three) times daily. What changed:  how much to take when to take this   pantoprazole 40 MG tablet Commonly known as: PROTONIX Take 1 tablet (40 mg total) by mouth 2 (two) times daily. What changed: when to take this   propranolol 10 MG tablet Commonly known as: INDERAL Take 1 tablet (10 mg total) by mouth 2 (two) times daily.   spironolactone 50 MG tablet Commonly known as: ALDACTONE Take 1 tablet (50 mg total) by mouth daily.   thiamine 100 MG tablet Commonly known as: Vitamin B-1 Take 1 tablet (100 mg total) by mouth daily. Start taking on: November 03, 2022               Durable Medical Equipment  (From admission, onward)           Start     Ordered   10/29/22 1542  For home use only DME Walker rolling  Once       Question Answer Comment  Walker: With 5 Inch Wheels   Patient needs a walker to treat with the following condition General weakness      10/29/22 1541            Follow-up Information     Jobe, 10/31/22., MD. Schedule an appointment as soon as  possible for a visit in 1 week(s).   Specialty: Internal Medicine Why: For hospital follow-up Contact information: [address] [place] [phone]         [phone], MD. Schedule an appointment as soon as possible for a visit in 4 week(s).   Specialty: Gastroenterology Why: Cirrhosis Contact information: [address] [place] [phone]                Discharge Exam: BP 124/64 (BP Location: Right Arm)   Pulse 67   Temp 99 F (37.2 C) (Oral)   Resp 20   Ht 5\' 9"  (1.753 m)   Wt 96.1 kg   SpO2 100%   BMI 31.29 kg/m   General exam: Appears calm and comfortable Respiratory system: Clear to auscultation. Respiratory effort normal. Cardiovascular system: S1 & S2 heard, RRR. Gastrointestinal system: Abdomen is nondistended, soft and nontender. Normal bowel sounds heard. Central nervous system: Alert and oriented. No focal neurological deficits. Musculoskeletal: No calf tenderness Skin: No cyanosis. No rashes Psychiatry: Judgement and insight appear normal. Mood & affect appropriate.   Condition at discharge: stable  The results of significant diagnostics from this hospitalization (including imaging, microbiology, ancillary  and laboratory) are listed below for reference.   Imaging Studies: DG HIP UNILAT WITH PELVIS 2-3 VIEWS LEFT  Result Date: 10/29/2022 CLINICAL DATA:  Left hip pain. EXAM: DG HIP (WITH OR WITHOUT PELVIS) 2-3V LEFT COMPARISON:  CT abdomen pelvis 04/26/2021 FINDINGS: Normal bone mineralization. The bilateral sacroiliac, bilateral femoroacetabular, and pubic symphysis joint spaces are maintained. No acute fracture or dislocation. IMPRESSION: Normal left hip radiographs. Electronically Signed   By: Neita Garnet M.D.   On: 10/29/2022 13:24   VAS [place] LOWER EXTREMITY VENOUS (DVT)  Result Date: 10/28/2022  Lower Venous DVT Study Patient Name:  BAO BAZEN  Date of Exam:   10/28/2022  Medical Rec #: [id number]       Accession #:    [id number] Date of Birth: 03-13-1986        Patient Gender: M Patient Age:   39 years Exam Location[facility]  [facility] Procedure:      VAS [place] LOWER EXTREMITY VENOUS (DVT) Referring Phys: AMRIT ADHIKARI --------------------------------------------------------------------------------  Indications: Swelling.  Risk Factors: None identified. Comparison Study: No prior studies. Performing Technologist: Chanda Busing RVT  Examination Guidelines: A complete evaluation includes B-mode imaging, spectral Doppler, color Doppler, and power Doppler as needed of all accessible portions of each vessel. Bilateral testing is considered an integral part of a complete examination. Limited examinations for reoccurring indications may be performed as noted. The reflux portion of the exam is performed with the patient in reverse Trendelenburg.  +-----+---------------+---------+-----------+----------+--------------+ RIGHTCompressibilityPhasicitySpontaneityPropertiesThrombus Aging +-----+---------------+---------+-----------+----------+--------------+ CFV  Full           Yes      Yes                                 +-----+---------------+---------+-----------+----------+--------------+   +---------+---------------+---------+-----------+----------+--------------+ LEFT     CompressibilityPhasicitySpontaneityPropertiesThrombus Aging +---------+---------------+---------+-----------+----------+--------------+ CFV      Full           Yes      Yes                                 +---------+---------------+---------+-----------+----------+--------------+ SFJ      Full                                                        +---------+---------------+---------+-----------+----------+--------------+ FV Prox  Full                                                        +---------+---------------+---------+-----------+----------+--------------+ FV Mid   Full                                                         +---------+---------------+---------+-----------+----------+--------------+ FV DistalFull                                                        +---------+---------------+---------+-----------+----------+--------------+  PFV      Full                                                        +---------+---------------+---------+-----------+----------+--------------+ POP      Full           Yes      Yes                                 +---------+---------------+---------+-----------+----------+--------------+ PTV      Full                                                        +---------+---------------+---------+-----------+----------+--------------+ PERO     Full                                                        +---------+---------------+---------+-----------+----------+--------------+     Summary: RIGHT: - No evidence of common femoral vein obstruction.  LEFT: - There is no evidence of deep vein thrombosis in the lower extremity.  - No cystic structure found in the popliteal fossa.  *See table(s) above for measurements and observations. Electronically signed by Monica Martinez MD on 10/28/2022 at 11:24:56 AM.    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 Also, in addition to the large spleno renal shunt described in the initial report, there is a second large portosystemic shunt in the left lower quadrant consisting of a communication between the SMV and the retroperitoneal venous structures. There are likely some ectopic varices within the fourth portion of the duodenum in the region of the ligament of Treitz. Two separate clusters of ectopic duodenal varices, one at the junction of the D2  and D3 segments and one in the distal D4 segment in the region of the ligament of Treitz could represent sources of GI bleeding. These results will be called to the ordering clinician or representative by the Radiologist Assistant, and communication documented in the PACS or [employer]. Electronically Signed   By: Jacqulynn Cadet M.D.   On: 10/27/2022 09:30   Result Date: 10/27/2022 CLINICAL DATA:  37 year old male with a history of cirrhosis, portal hypertension and varices. EXAM: CT ANGIOGRAPHY CHEST, ABDOMEN AND PELVIS TECHNIQUE: Non-contrast CT of the chest was initially obtained. Multidetector CT imaging through the chest, abdomen and pelvis was performed using the standard protocol during bolus administration of intravenous contrast. Multiplanar reconstructed images and MIPs were obtained and reviewed to evaluate the vascular anatomy. RADIATION DOSE REDUCTION: This exam was performed according to the departmental dose-optimization program which includes automated exposure control, adjustment of the mA and/or kV according to patient size and/or use of iterative reconstruction technique. CONTRAST:  162mL OMNIPAQUE IOHEXOL 350 MG/ML SOLN  COMPARISON:  Prior CT scan of the chest, abdomen and pelvis 01/13/2022 FINDINGS: CTA CHEST FINDINGS Cardiovascular: Significant cardiomegaly, advanced for age. Conventional 3 vessel arch anatomy. No evidence of aneurysm or dissection. Mediastinum/Nodes: Unremarkable CT appearance of the thyroid gland. No suspicious mediastinal or hilar adenopathy. No soft tissue mediastinal mass. The thoracic esophagus is unremarkable given limitations of arterial phase study. Lungs/Pleura: Lungs are clear. No pleural effusion or pneumothorax. Musculoskeletal: No chest wall abnormality. No acute or significant osseous findings. Review of the MIP images confirms the above findings. CTA ABDOMEN AND PELVIS FINDINGS VASCULAR Aorta: Normal caliber aorta without aneurysm, dissection,  vasculitis or significant stenosis. Celiac: Patent without evidence of aneurysm, dissection, vasculitis or significant stenosis. SMA: Patent without evidence of aneurysm, dissection, vasculitis or significant stenosis. Renals: Both renal arteries are patent without evidence of aneurysm, dissection, vasculitis, fibromuscular dysplasia or significant stenosis. IMA: Patent without evidence of aneurysm, dissection, vasculitis or significant stenosis. Inflow: Patent without evidence of aneurysm, dissection, vasculitis or significant stenosis. Veins: Somewhat limited evaluation given arterial phase timing of the study. The portal veins appear grossly patent. No evidence of gastro renal shunt or gastric varices. No significant esophageal varices are noted. 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 Small recanalized periumbilical vein. Review of the MIP images confirms the above findings. NON-VASCULAR Hepatobiliary: Low-attenuation heterogeneous liver. The morphology is suggestive of cirrhosis. No definite enhancing lesion. Stones layer within the gallbladder lumen. No intra or extrahepatic biliary ductal dilatation. Pancreas: Unremarkable. No pancreatic ductal dilatation or surrounding inflammatory changes. Spleen: Mild splenomegaly. Adrenals/Urinary Tract: Adrenal glands are unremarkable. Kidneys are normal, without renal calculi, focal lesion, or hydronephrosis. Bladder is unremarkable. Stomach/Bowel: Limited evaluation given arterial phase timing. Small esophageal varices may be present. No definite gastric varices. No focal bowel wall thickening or evidence of obstruction. Lymphatic: No suspicious lymphadenopathy. Reproductive: Prostate is unremarkable. Other: No abdominal wall hernia or abnormality. No abdominopelvic ascites. Musculoskeletal: No acute or significant osseous findings. Review of the MIP images confirms the above findings. IMPRESSION: 1. Somewhat limited evaluation of esophageal and gastric varices given  arterial phase timing of this dissection protocol CT arteriogram of the chest abdomen and pelvis. For future reference, evaluation of portal venous anatomy and venous varicosities is best studied with a BRTO (balloon occluded transvenous retrograde obliteration) protocol CT scan of the abdomen and pelvis with contrast. If there is ever a question of which study to order, interventional radiology is available 24/7/365 to answer questions. 2. There may be small esophageal varices although not definitive in the absence of dedicated venous phase imaging. No evidence of gastro renal shunt or gastric varices. 3. Evidence of portal hypertension with splenomegaly and large splenorenal portosystemic shunt. 4. Hepatic cirrhosis. 5. Age advanced cardiomegaly. 6. Cholelithiasis. 7. No focal arterial abnormality. Signed, Sterling BigHeath K. McCullough, MD, RPVI [facility] Electronically Signed: By: Malachy MoanHeath  McCullough M.D. On: 10/26/2022 15:47    Microbiology: Results for orders placed or performed during the hospital encounter of 04/25/21  Resp Panel by RT-PCR (Flu A&B, Covid) Nasopharyngeal Swab     Status: None   Collection Time: 04/25/21  7:35 AM   Specimen: Nasopharyngeal Swab; Nasopharyngeal(NP) swabs in vial transport medium  Result Value Ref Range Status   SARS Coronavirus 2 by RT PCR NEGATIVE NEGATIVE Final    Comment: (NOTE) SARS-CoV-2 target nucleic acids are NOT DETECTED.  The SARS-CoV-2 RNA is generally detectable in upper respiratory specimens during the acute phase of infection. The lowest concentration of SARS-CoV-2 viral copies this assay  can detect is 138 copies/mL. A negative result does not preclude SARS-Cov-2 infection and should not be used as the sole basis for treatment or other patient management decisions. A negative result may occur with  improper specimen collection/handling, submission of specimen other than nasopharyngeal swab,  presence of viral mutation(s) within the areas targeted by this assay, and inadequate number of viral copies(<138 copies/mL). A negative result must be combined with clinical observations, patient history, and epidemiological information. The expected result is Negative.  Fact Sheet for Patients:  EntrepreneurPulse.com.au  Fact Sheet for Healthcare Providers:  IncredibleEmployment.be  This test is no t yet approved or cleared by the [place] FDA and  has been authorized for detection and/or diagnosis of SARS-CoV-2 by FDA under an Emergency Use Authorization (EUA). This EUA will remain  in effect (meaning this test can be used) for the duration of the COVID-19 declaration under Section 564(b)(1) of the Act, 21 U.S.C.section 360bbb-3(b)(1), unless the authorization is terminated  or revoked sooner.       Influenza A by PCR NEGATIVE NEGATIVE Final   Influenza B by PCR NEGATIVE NEGATIVE Final    Comment: (NOTE) The Xpert Xpress SARS-CoV-2/FLU/RSV plus assay is intended as an aid in the diagnosis of influenza from Nasopharyngeal swab specimens and should not be used as a sole basis for treatment. Nasal washings and aspirates are unacceptable for Xpert Xpress SARS-CoV-2/FLU/RSV testing.  Fact Sheet for Patients: EntrepreneurPulse.com.au  Fact Sheet for Healthcare Providers: IncredibleEmployment.be  This test is not yet approved or cleared by the [place] FDA and has been authorized for detection and/or diagnosis of SARS-CoV-2 by FDA under an Emergency Use Authorization (EUA). This EUA will remain in effect (meaning this test can be used) for the duration of the COVID-19 declaration under Section 564(b)(1) of the Act, 21 U.S.C. section 360bbb-3(b)(1), unless the authorization is terminated or revoked.  Performed at [facility], [address] [place], [place] [address]   MRSA  Next Gen by PCR, Nasal     Status: None   Collection Time: 04/25/21  1:32 PM   Specimen: Nasal Mucosa; Nasal Swab  Result Value Ref Range Status   MRSA by PCR Next Gen NOT DETECTED NOT DETECTED Final    Comment: (NOTE) The GeneXpert MRSA Assay (FDA approved for NASAL specimens only), is one component of a comprehensive MRSA colonization surveillance program. It is not intended to diagnose MRSA infection nor to guide or monitor treatment for MRSA infections. Test performance is not FDA approved in patients less than 65 years old. Performed at [facility], [place] [address] [place], [place] [address]     Labs: CBC: Recent Labs  Lab 10/29/22 0409 10/30/22 0423 10/30/22 0812 10/30/22 1554 10/31/22 0348 10/31/22 1606 11/01/22 0325 11/02/22 0716  WBC 7.0 5.4  --   --  5.3  --  5.7 5.1  HGB 7.5* 6.7*   < > 8.1* 7.4* 7.3* 7.2* 7.7*  HCT 25.1* 22.1*   < > 26.9* 24.7* 24.0* 23.7* 25.7*  MCV 83.1 82.5  --   --  83.4  --  82.6 84.8  PLT 150 150  --   --  153  --  151 182   < > = values in this interval not displayed.   Basic Metabolic Panel: Recent Labs  Lab 10/28/22 0651 10/29/22 0409 10/29/22 1055 11/01/22 0325 11/02/22 0716  NA 133* 133* 133* 135 132*  K 3.5 3.3* 3.6 3.6 3.9  CL 102 104 103 108  104  CO2 22 20* 22 21* 21*  GLUCOSE 82 70 120* 91 92  BUN 10 9 8 6 6   CREATININE 0.69 0.82 0.70 0.50* 0.52*  CALCIUM 8.2* 8.2* 7.8* 8.1* 8.3*   Liver Function Tests: Recent Labs  Lab 10/28/22 0651 10/29/22 0409 10/29/22 1055 10/30/22 1554 11/01/22 0325 11/02/22 0716  AST 75* 84* 78*  --  72* 77*  ALT 22 23 20   --  23 23  ALKPHOS 75 83 69  --  78 61  BILITOT 11.8* 12.1* 11.4* 12.1* 10.7* 11.5*  PROT 7.4 8.1 7.8  --  7.3 7.7  ALBUMIN 1.9* 2.1* 2.1*  --  1.8* 1.9*    Discharge time spent: 35 minutes.  Signed: 01/01/23, MD [facility] 11/02/2022

## 2022-11-02 NOTE — Plan of Care (Signed)

## 2022-11-02 NOTE — Discharge Instructions (Signed)
[name],  You were in the hospital with because of your liver and low blood count. The low blood count is presumed to be secondary to bleeding, but we could not find a definitive bleed. Thankfully, your hemoglobin is now stable. Please take your medications as prescribed. Please follow-up with your PCP and GI doctors.

## 2022-11-03 ENCOUNTER — Encounter (HOSPITAL_COMMUNITY): Payer: Self-pay | Admitting: Gastroenterology

## 2022-11-04 ENCOUNTER — Other Ambulatory Visit (HOSPITAL_COMMUNITY): Payer: Self-pay

## 2022-11-04 NOTE — Anesthesia Postprocedure Evaluation (Signed)
Anesthesia Post Note  Patient: [name]  Procedure(s) Performed: ESOPHAGOGASTRODUODENOSCOPY (EGD) WITH PROPOFOL     Patient location during evaluation: PACU Anesthesia Type: MAC Level of consciousness: awake and alert Pain management: pain level controlled Vital Signs Assessment: post-procedure vital signs reviewed and stable Respiratory status: spontaneous breathing, nonlabored ventilation, respiratory function stable and patient connected to nasal cannula oxygen Cardiovascular status: stable and blood pressure returned to baseline Postop Assessment: no apparent nausea or vomiting Anesthetic complications: no   No notable events documented.  Last Vitals:  Vitals:   11/02/22 0900 11/02/22 1000  BP: 124/64   Pulse: 67   Resp:  20  Temp: 37.2 C   SpO2: 100%     Last Pain:  Vitals:   11/02/22 0900  TempSrc: Oral  PainSc: 0-No pain                 Tiajuana Amass

## 2022-11-05 ENCOUNTER — Other Ambulatory Visit (HOSPITAL_COMMUNITY): Payer: Self-pay

## 2022-11-19 ENCOUNTER — Other Ambulatory Visit (HOSPITAL_COMMUNITY): Payer: Self-pay

## 2023-01-23 ENCOUNTER — Emergency Department (HOSPITAL_COMMUNITY): Payer: Medicaid Other

## 2023-01-23 ENCOUNTER — Encounter (HOSPITAL_COMMUNITY): Payer: Self-pay

## 2023-01-23 ENCOUNTER — Inpatient Hospital Stay (HOSPITAL_COMMUNITY): Payer: Medicaid Other

## 2023-01-23 ENCOUNTER — Inpatient Hospital Stay (HOSPITAL_COMMUNITY)
Admission: EM | Admit: 2023-01-23 | Discharge: 2023-03-01 | DRG: 870 | Disposition: E | Payer: Medicaid Other | Attending: Pulmonary Disease | Admitting: Pulmonary Disease

## 2023-01-23 DIAGNOSIS — R531 Weakness: Secondary | ICD-10-CM | POA: Diagnosis not present

## 2023-01-23 DIAGNOSIS — K7030 Alcoholic cirrhosis of liver without ascites: Secondary | ICD-10-CM | POA: Diagnosis present

## 2023-01-23 DIAGNOSIS — K7290 Hepatic failure, unspecified without coma: Secondary | ICD-10-CM | POA: Diagnosis not present

## 2023-01-23 DIAGNOSIS — Z515 Encounter for palliative care: Secondary | ICD-10-CM

## 2023-01-23 DIAGNOSIS — R34 Anuria and oliguria: Secondary | ICD-10-CM | POA: Diagnosis not present

## 2023-01-23 DIAGNOSIS — B9561 Methicillin susceptible Staphylococcus aureus infection as the cause of diseases classified elsewhere: Secondary | ICD-10-CM | POA: Diagnosis not present

## 2023-01-23 DIAGNOSIS — K2970 Gastritis, unspecified, without bleeding: Secondary | ICD-10-CM | POA: Diagnosis present

## 2023-01-23 DIAGNOSIS — Z91148 Patient's other noncompliance with medication regimen for other reason: Secondary | ICD-10-CM

## 2023-01-23 DIAGNOSIS — I119 Hypertensive heart disease without heart failure: Secondary | ICD-10-CM | POA: Diagnosis present

## 2023-01-23 DIAGNOSIS — E872 Acidosis, unspecified: Secondary | ICD-10-CM | POA: Diagnosis present

## 2023-01-23 DIAGNOSIS — K767 Hepatorenal syndrome: Secondary | ICD-10-CM | POA: Diagnosis not present

## 2023-01-23 DIAGNOSIS — I509 Heart failure, unspecified: Secondary | ICD-10-CM | POA: Diagnosis not present

## 2023-01-23 DIAGNOSIS — K7010 Alcoholic hepatitis without ascites: Secondary | ICD-10-CM | POA: Diagnosis present

## 2023-01-23 DIAGNOSIS — L98499 Non-pressure chronic ulcer of skin of other sites with unspecified severity: Secondary | ICD-10-CM | POA: Diagnosis not present

## 2023-01-23 DIAGNOSIS — E669 Obesity, unspecified: Secondary | ICD-10-CM | POA: Diagnosis present

## 2023-01-23 DIAGNOSIS — R310 Gross hematuria: Secondary | ICD-10-CM

## 2023-01-23 DIAGNOSIS — I4892 Unspecified atrial flutter: Secondary | ICD-10-CM | POA: Diagnosis not present

## 2023-01-23 DIAGNOSIS — I471 Supraventricular tachycardia, unspecified: Secondary | ICD-10-CM | POA: Diagnosis not present

## 2023-01-23 DIAGNOSIS — D689 Coagulation defect, unspecified: Secondary | ICD-10-CM | POA: Diagnosis present

## 2023-01-23 DIAGNOSIS — I864 Gastric varices: Secondary | ICD-10-CM | POA: Diagnosis present

## 2023-01-23 DIAGNOSIS — I8511 Secondary esophageal varices with bleeding: Secondary | ICD-10-CM | POA: Diagnosis present

## 2023-01-23 DIAGNOSIS — Z7189 Other specified counseling: Secondary | ICD-10-CM

## 2023-01-23 DIAGNOSIS — Z66 Do not resuscitate: Secondary | ICD-10-CM | POA: Diagnosis not present

## 2023-01-23 DIAGNOSIS — K7460 Unspecified cirrhosis of liver: Secondary | ICD-10-CM

## 2023-01-23 DIAGNOSIS — G9341 Metabolic encephalopathy: Secondary | ICD-10-CM | POA: Diagnosis not present

## 2023-01-23 DIAGNOSIS — M7989 Other specified soft tissue disorders: Secondary | ICD-10-CM | POA: Diagnosis present

## 2023-01-23 DIAGNOSIS — K766 Portal hypertension: Secondary | ICD-10-CM | POA: Diagnosis present

## 2023-01-23 DIAGNOSIS — Z711 Person with feared health complaint in whom no diagnosis is made: Secondary | ICD-10-CM | POA: Diagnosis not present

## 2023-01-23 DIAGNOSIS — N179 Acute kidney failure, unspecified: Secondary | ICD-10-CM | POA: Diagnosis not present

## 2023-01-23 DIAGNOSIS — A4101 Sepsis due to Methicillin susceptible Staphylococcus aureus: Secondary | ICD-10-CM | POA: Diagnosis present

## 2023-01-23 DIAGNOSIS — K068 Other specified disorders of gingiva and edentulous alveolar ridge: Secondary | ICD-10-CM | POA: Diagnosis present

## 2023-01-23 DIAGNOSIS — F419 Anxiety disorder, unspecified: Secondary | ICD-10-CM | POA: Diagnosis present

## 2023-01-23 DIAGNOSIS — N170 Acute kidney failure with tubular necrosis: Secondary | ICD-10-CM | POA: Diagnosis present

## 2023-01-23 DIAGNOSIS — R739 Hyperglycemia, unspecified: Secondary | ICD-10-CM | POA: Diagnosis not present

## 2023-01-23 DIAGNOSIS — K7682 Hepatic encephalopathy: Secondary | ICD-10-CM | POA: Insufficient documentation

## 2023-01-23 DIAGNOSIS — R001 Bradycardia, unspecified: Secondary | ICD-10-CM | POA: Diagnosis not present

## 2023-01-23 DIAGNOSIS — Z6841 Body Mass Index (BMI) 40.0 and over, adult: Secondary | ICD-10-CM

## 2023-01-23 DIAGNOSIS — R7989 Other specified abnormal findings of blood chemistry: Secondary | ICD-10-CM | POA: Diagnosis present

## 2023-01-23 DIAGNOSIS — R578 Other shock: Secondary | ICD-10-CM | POA: Diagnosis present

## 2023-01-23 DIAGNOSIS — Y92239 Unspecified place in hospital as the place of occurrence of the external cause: Secondary | ICD-10-CM | POA: Diagnosis not present

## 2023-01-23 DIAGNOSIS — J40 Bronchitis, not specified as acute or chronic: Secondary | ICD-10-CM | POA: Diagnosis not present

## 2023-01-23 DIAGNOSIS — R161 Splenomegaly, not elsewhere classified: Secondary | ICD-10-CM | POA: Diagnosis present

## 2023-01-23 DIAGNOSIS — J810 Acute pulmonary edema: Secondary | ICD-10-CM | POA: Diagnosis present

## 2023-01-23 DIAGNOSIS — T380X5A Adverse effect of glucocorticoids and synthetic analogues, initial encounter: Secondary | ICD-10-CM | POA: Diagnosis not present

## 2023-01-23 DIAGNOSIS — R6521 Severe sepsis with septic shock: Secondary | ICD-10-CM | POA: Diagnosis not present

## 2023-01-23 DIAGNOSIS — E871 Hypo-osmolality and hyponatremia: Secondary | ICD-10-CM | POA: Diagnosis present

## 2023-01-23 DIAGNOSIS — E875 Hyperkalemia: Secondary | ICD-10-CM | POA: Diagnosis present

## 2023-01-23 DIAGNOSIS — S81802A Unspecified open wound, left lower leg, initial encounter: Secondary | ICD-10-CM | POA: Diagnosis present

## 2023-01-23 DIAGNOSIS — E861 Hypovolemia: Secondary | ICD-10-CM | POA: Diagnosis present

## 2023-01-23 DIAGNOSIS — E8770 Fluid overload, unspecified: Secondary | ICD-10-CM | POA: Diagnosis present

## 2023-01-23 DIAGNOSIS — K3189 Other diseases of stomach and duodenum: Secondary | ICD-10-CM | POA: Diagnosis present

## 2023-01-23 DIAGNOSIS — J9601 Acute respiratory failure with hypoxia: Secondary | ICD-10-CM | POA: Diagnosis not present

## 2023-01-23 DIAGNOSIS — R4589 Other symptoms and signs involving emotional state: Secondary | ICD-10-CM | POA: Diagnosis not present

## 2023-01-23 DIAGNOSIS — J690 Pneumonitis due to inhalation of food and vomit: Secondary | ICD-10-CM | POA: Insufficient documentation

## 2023-01-23 DIAGNOSIS — Z1152 Encounter for screening for COVID-19: Secondary | ICD-10-CM | POA: Diagnosis not present

## 2023-01-23 DIAGNOSIS — D649 Anemia, unspecified: Secondary | ICD-10-CM | POA: Diagnosis not present

## 2023-01-23 DIAGNOSIS — K7040 Alcoholic hepatic failure without coma: Secondary | ICD-10-CM | POA: Diagnosis present

## 2023-01-23 DIAGNOSIS — J9600 Acute respiratory failure, unspecified whether with hypoxia or hypercapnia: Secondary | ICD-10-CM | POA: Diagnosis not present

## 2023-01-23 DIAGNOSIS — F1010 Alcohol abuse, uncomplicated: Secondary | ICD-10-CM | POA: Diagnosis not present

## 2023-01-23 DIAGNOSIS — Z79899 Other long term (current) drug therapy: Secondary | ICD-10-CM

## 2023-01-23 DIAGNOSIS — D62 Acute posthemorrhagic anemia: Secondary | ICD-10-CM | POA: Diagnosis present

## 2023-01-23 DIAGNOSIS — K560 Paralytic ileus: Secondary | ICD-10-CM | POA: Diagnosis not present

## 2023-01-23 DIAGNOSIS — K8020 Calculus of gallbladder without cholecystitis without obstruction: Secondary | ICD-10-CM | POA: Diagnosis present

## 2023-01-23 DIAGNOSIS — E162 Hypoglycemia, unspecified: Secondary | ICD-10-CM | POA: Diagnosis not present

## 2023-01-23 DIAGNOSIS — D696 Thrombocytopenia, unspecified: Secondary | ICD-10-CM | POA: Diagnosis not present

## 2023-01-23 DIAGNOSIS — L8990 Pressure ulcer of unspecified site, unspecified stage: Secondary | ICD-10-CM | POA: Insufficient documentation

## 2023-01-23 DIAGNOSIS — I878 Other specified disorders of veins: Secondary | ICD-10-CM | POA: Diagnosis present

## 2023-01-23 LAB — CBC
HCT: 17.2 % — ABNORMAL LOW (ref 39.0–52.0)
Hemoglobin: 5.3 g/dL — CL (ref 13.0–17.0)
MCH: 25.2 pg — ABNORMAL LOW (ref 26.0–34.0)
MCHC: 30.8 g/dL (ref 30.0–36.0)
MCV: 81.9 fL (ref 80.0–100.0)
Platelets: 168 10*3/uL (ref 150–400)
RBC: 2.1 MIL/uL — ABNORMAL LOW (ref 4.22–5.81)
RDW: 21.2 % — ABNORMAL HIGH (ref 11.5–15.5)
WBC: 7.3 10*3/uL (ref 4.0–10.5)
nRBC: 3.4 % — ABNORMAL HIGH (ref 0.0–0.2)

## 2023-01-23 LAB — COMPREHENSIVE METABOLIC PANEL
ALT: 18 U/L (ref 0–44)
AST: 62 U/L — ABNORMAL HIGH (ref 15–41)
Albumin: 2.3 g/dL — ABNORMAL LOW (ref 3.5–5.0)
Alkaline Phosphatase: 79 U/L (ref 38–126)
Anion gap: 14 (ref 5–15)
BUN: 17 mg/dL (ref 6–20)
CO2: 21 mmol/L — ABNORMAL LOW (ref 22–32)
Calcium: 7.7 mg/dL — ABNORMAL LOW (ref 8.9–10.3)
Chloride: 95 mmol/L — ABNORMAL LOW (ref 98–111)
Creatinine, Ser: 1.6 mg/dL — ABNORMAL HIGH (ref 0.61–1.24)
GFR, Estimated: 56 mL/min — ABNORMAL LOW (ref >60)
Glucose, Bld: 115 mg/dL — ABNORMAL HIGH (ref 70–99)
Potassium: 3.2 mmol/L — ABNORMAL LOW (ref 3.5–5.1)
Sodium: 130 mmol/L — ABNORMAL LOW (ref 135–145)
Total Bilirubin: 12.1 mg/dL — ABNORMAL HIGH (ref 0.3–1.2)
Total Protein: 9.1 g/dL — ABNORMAL HIGH (ref 6.5–8.1)

## 2023-01-23 LAB — CBC WITH DIFFERENTIAL/PLATELET
Abs Immature Granulocytes: 0.06 10*3/uL (ref 0.00–0.07)
Basophils Absolute: 0 10*3/uL (ref 0.0–0.1)
Basophils Relative: 0 %
Eosinophils Absolute: 0.1 10*3/uL (ref 0.0–0.5)
Eosinophils Relative: 2 %
HCT: 11.3 % — ABNORMAL LOW (ref 39.0–52.0)
Hemoglobin: 3.2 g/dL — CL (ref 13.0–17.0)
Immature Granulocytes: 1 %
Lymphocytes Relative: 12 %
Lymphs Abs: 0.7 10*3/uL (ref 0.7–4.0)
MCH: 22.2 pg — ABNORMAL LOW (ref 26.0–34.0)
MCHC: 28.3 g/dL — ABNORMAL LOW (ref 30.0–36.0)
MCV: 78.5 fL — ABNORMAL LOW (ref 80.0–100.0)
Monocytes Absolute: 1.2 10*3/uL — ABNORMAL HIGH (ref 0.1–1.0)
Monocytes Relative: 20 %
Neutro Abs: 4 10*3/uL (ref 1.7–7.7)
Neutrophils Relative %: 65 %
Platelets: 222 10*3/uL (ref 150–400)
RBC: 1.44 MIL/uL — ABNORMAL LOW (ref 4.22–5.81)
RDW: 27.2 % — ABNORMAL HIGH (ref 11.5–15.5)
WBC: 6.1 10*3/uL (ref 4.0–10.5)
nRBC: 4.6 % — ABNORMAL HIGH (ref 0.0–0.2)

## 2023-01-23 LAB — I-STAT CHEM 8, ED
BUN: 15 mg/dL (ref 6–20)
Calcium, Ion: 0.99 mmol/L — ABNORMAL LOW (ref 1.15–1.40)
Chloride: 95 mmol/L — ABNORMAL LOW (ref 98–111)
Creatinine, Ser: 1.8 mg/dL — ABNORMAL HIGH (ref 0.61–1.24)
Glucose, Bld: 114 mg/dL — ABNORMAL HIGH (ref 70–99)
HCT: 15 % — ABNORMAL LOW (ref 39.0–52.0)
Hemoglobin: 5.1 g/dL — CL (ref 13.0–17.0)
Potassium: 3.2 mmol/L — ABNORMAL LOW (ref 3.5–5.1)
Sodium: 134 mmol/L — ABNORMAL LOW (ref 135–145)
TCO2: 22 mmol/L (ref 22–32)

## 2023-01-23 LAB — BPAM RBC
Blood Product Expiration Date: 202405222359
Blood Product Expiration Date: 202405272359
Blood Product Expiration Date: 202405312359
Unit Type and Rh: 5100

## 2023-01-23 LAB — AMMONIA: Ammonia: 37 umol/L — ABNORMAL HIGH (ref 9–35)

## 2023-01-23 LAB — TYPE AND SCREEN: Unit division: 0

## 2023-01-23 LAB — SAMPLE TO BLOOD BANK

## 2023-01-23 LAB — RESP PANEL BY RT-PCR (RSV, FLU A&B, COVID)  RVPGX2
Influenza A by PCR: NEGATIVE
Influenza B by PCR: NEGATIVE
Resp Syncytial Virus by PCR: NEGATIVE
SARS Coronavirus 2 by RT PCR: NEGATIVE

## 2023-01-23 LAB — GLUCOSE, CAPILLARY
Glucose-Capillary: 98 mg/dL (ref 70–99)
Glucose-Capillary: 106 mg/dL — ABNORMAL HIGH (ref 70–99)
Glucose-Capillary: 113 mg/dL — ABNORMAL HIGH (ref 70–99)

## 2023-01-23 LAB — PREPARE RBC (CROSSMATCH)

## 2023-01-23 LAB — MAGNESIUM: Magnesium: 1.8 mg/dL (ref 1.7–2.4)

## 2023-01-23 LAB — MRSA NEXT GEN BY PCR, NASAL: MRSA by PCR Next Gen: NOT DETECTED

## 2023-01-23 LAB — BRAIN NATRIURETIC PEPTIDE: B Natriuretic Peptide: 1344.4 pg/mL — ABNORMAL HIGH (ref 0.0–100.0)

## 2023-01-23 LAB — PROTIME-INR
INR: 2.5 — ABNORMAL HIGH (ref 0.8–1.2)
Prothrombin Time: 26.4 seconds — ABNORMAL HIGH (ref 11.4–15.2)

## 2023-01-23 LAB — APTT: aPTT: 48 seconds — ABNORMAL HIGH (ref 24–36)

## 2023-01-23 LAB — ETHANOL: Alcohol, Ethyl (B): 125 mg/dL — ABNORMAL HIGH (ref <10)

## 2023-01-23 MED ORDER — LORAZEPAM 1 MG PO TABS
1.0000 mg | ORAL_TABLET | ORAL | Status: DC | PRN
  Administered 2023-01-23 (×2): 1 mg via ORAL
  Administered 2023-01-24: 2 mg via ORAL
  Filled 2023-01-23: qty 1
  Filled 2023-01-23: qty 2
  Filled 2023-01-23: qty 1

## 2023-01-23 MED ORDER — THIAMINE HCL 100 MG/ML IJ SOLN
100.0000 mg | Freq: Every day | INTRAMUSCULAR | Status: DC
  Administered 2023-01-25 – 2023-01-26 (×2): 100 mg via INTRAVENOUS
  Filled 2023-01-23 (×3): qty 2

## 2023-01-23 MED ORDER — POTASSIUM CHLORIDE 10 MEQ/100ML IV SOLN
10.0000 meq | INTRAVENOUS | Status: AC
  Administered 2023-01-23 (×3): 10 meq via INTRAVENOUS
  Filled 2023-01-23 (×3): qty 100

## 2023-01-23 MED ORDER — PANTOPRAZOLE SODIUM 40 MG IV SOLR: 40.0000 mg | Freq: Two times a day (BID) | INTRAVENOUS | Status: DC

## 2023-01-23 MED ORDER — CHLORHEXIDINE GLUCONATE CLOTH 2 % EX PADS
6.0000 | MEDICATED_PAD | Freq: Every day | CUTANEOUS | Status: DC
  Administered 2023-01-23 – 2023-01-28 (×6): 6 via TOPICAL

## 2023-01-23 MED ORDER — POLYETHYLENE GLYCOL 3350 17 G PO PACK
17.0000 g | PACK | Freq: Every day | ORAL | Status: DC | PRN
  Administered 2023-01-26: 17 g via ORAL

## 2023-01-23 MED ORDER — FOLIC ACID 1 MG PO TABS
1.0000 mg | ORAL_TABLET | Freq: Every day | ORAL | Status: DC
  Administered 2023-01-23 – 2023-01-26 (×3): 1 mg via ORAL
  Filled 2023-01-23 (×3): qty 1

## 2023-01-23 MED ORDER — SODIUM CHLORIDE 0.9% IV SOLUTION: Freq: Once | INTRAVENOUS | Status: AC

## 2023-01-23 MED ORDER — DOCUSATE SODIUM 100 MG PO CAPS: 100.0000 mg | ORAL_CAPSULE | Freq: Two times a day (BID) | ORAL | Status: DC | PRN

## 2023-01-23 MED ORDER — INSULIN ASPART 100 UNIT/ML IJ SOLN
0.0000 [IU] | INTRAMUSCULAR | Status: DC
  Administered 2023-01-26 – 2023-01-27 (×3): 2 [IU] via SUBCUTANEOUS
  Administered 2023-01-27: 3 [IU] via SUBCUTANEOUS
  Administered 2023-01-27 – 2023-01-29 (×7): 2 [IU] via SUBCUTANEOUS
  Administered 2023-01-29: 3 [IU] via SUBCUTANEOUS
  Administered 2023-01-29 (×3): 2 [IU] via SUBCUTANEOUS
  Administered 2023-01-30: 3 [IU] via SUBCUTANEOUS
  Administered 2023-01-30 (×4): 2 [IU] via SUBCUTANEOUS
  Administered 2023-01-30: 3 [IU] via SUBCUTANEOUS
  Administered 2023-01-31 – 2023-02-01 (×4): 2 [IU] via SUBCUTANEOUS
  Filled 2023-01-23: qty 0.15

## 2023-01-23 MED ORDER — SODIUM CHLORIDE 0.9 % IV SOLN
2.0000 g | Freq: Once | INTRAVENOUS | Status: DC
  Filled 2023-01-23: qty 20

## 2023-01-23 MED ORDER — MEDIHONEY WOUND/BURN DRESSING EX PSTE
1.0000 | PASTE | Freq: Every day | CUTANEOUS | Status: DC
  Administered 2023-01-23 – 2023-02-04 (×12): 1 via TOPICAL
  Filled 2023-01-23: qty 44

## 2023-01-23 MED ORDER — SODIUM CHLORIDE (PF) 0.9 % IJ SOLN
INTRAMUSCULAR | Status: AC
  Filled 2023-01-23: qty 50

## 2023-01-23 MED ORDER — PANTOPRAZOLE SODIUM 40 MG IV SOLR
40.0000 mg | Freq: Two times a day (BID) | INTRAVENOUS | Status: DC
  Administered 2023-01-23 – 2023-02-04 (×24): 40 mg via INTRAVENOUS
  Filled 2023-01-23 (×24): qty 10

## 2023-01-23 MED ORDER — MAGNESIUM SULFATE 2 GM/50ML IV SOLN
2.0000 g | Freq: Once | INTRAVENOUS | Status: AC
  Administered 2023-01-23: 2 g via INTRAVENOUS
  Filled 2023-01-23: qty 50

## 2023-01-23 MED ORDER — ESMOLOL HCL-SODIUM CHLORIDE 2000 MG/100ML IV SOLN
25.0000 ug/kg/min | INTRAVENOUS | Status: DC
  Filled 2023-01-23: qty 100

## 2023-01-23 MED ORDER — OCTREOTIDE LOAD VIA INFUSION
50.0000 ug | Freq: Once | INTRAVENOUS | Status: AC
  Administered 2023-01-23: 50 ug via INTRAVENOUS
  Filled 2023-01-23: qty 25

## 2023-01-23 MED ORDER — ORAL CARE MOUTH RINSE: 15.0000 mL | OROMUCOSAL | Status: DC | PRN

## 2023-01-23 MED ORDER — SODIUM CHLORIDE 0.9 % IV SOLN
50.0000 ug/h | INTRAVENOUS | Status: DC
  Administered 2023-01-23 – 2023-01-30 (×14): 50 ug/h via INTRAVENOUS
  Filled 2023-01-23 (×20): qty 1

## 2023-01-23 MED ORDER — SODIUM CHLORIDE 0.9 % IV SOLN
1.0000 g | INTRAVENOUS | Status: AC
  Administered 2023-01-24 – 2023-01-27 (×4): 1 g via INTRAVENOUS
  Filled 2023-01-23 (×4): qty 10

## 2023-01-23 MED ORDER — SODIUM CHLORIDE 0.9 % IV SOLN
2.0000 g | Freq: Once | INTRAVENOUS | Status: AC
  Administered 2023-01-23: 2 g via INTRAVENOUS
  Filled 2023-01-23: qty 20

## 2023-01-23 MED ORDER — FUROSEMIDE 10 MG/ML IJ SOLN: 60.0000 mg | Freq: Once | INTRAMUSCULAR | Status: DC

## 2023-01-23 MED ORDER — ADULT MULTIVITAMIN W/MINERALS CH
1.0000 | ORAL_TABLET | Freq: Every day | ORAL | Status: DC
  Administered 2023-01-23: 1 via ORAL
  Filled 2023-01-23: qty 1

## 2023-01-23 MED ORDER — ONDANSETRON HCL 4 MG/2ML IJ SOLN: 4.0000 mg | Freq: Four times a day (QID) | INTRAMUSCULAR | Status: DC | PRN

## 2023-01-23 MED ORDER — ALBUMIN HUMAN 25 % IV SOLN
25.0000 g | Freq: Once | INTRAVENOUS | Status: AC
  Administered 2023-01-23: 25 g via INTRAVENOUS
  Filled 2023-01-23: qty 100

## 2023-01-23 MED ORDER — VITAMIN K1 10 MG/ML IJ SOLN
10.0000 mg | Freq: Once | INTRAVENOUS | Status: AC
  Administered 2023-01-23: 10 mg via INTRAVENOUS
  Filled 2023-01-23: qty 1

## 2023-01-23 MED ORDER — PANTOPRAZOLE 80MG IVPB - SIMPLE MED
80.0000 mg | Freq: Once | INTRAVENOUS | Status: AC
  Administered 2023-01-23: 80 mg via INTRAVENOUS
  Filled 2023-01-23: qty 80

## 2023-01-23 MED ORDER — IOHEXOL 350 MG/ML SOLN
100.0000 mL | Freq: Once | INTRAVENOUS | Status: AC | PRN
  Administered 2023-01-23: 100 mL via INTRAVENOUS

## 2023-01-23 MED ORDER — FUROSEMIDE 40 MG PO TABS
60.0000 mg | ORAL_TABLET | ORAL | Status: AC
  Administered 2023-01-23: 60 mg via ORAL
  Filled 2023-01-23: qty 2

## 2023-01-23 MED ORDER — ETOMIDATE 2 MG/ML IV SOLN
10.0000 mg | Freq: Once | INTRAVENOUS | Status: AC
  Administered 2023-01-23: 10 mg via INTRAVENOUS
  Filled 2023-01-23: qty 10

## 2023-01-23 MED ORDER — THIAMINE MONONITRATE 100 MG PO TABS
100.0000 mg | ORAL_TABLET | Freq: Every day | ORAL | Status: DC
  Administered 2023-01-23 – 2023-01-24 (×2): 100 mg via ORAL
  Filled 2023-01-23 (×2): qty 1

## 2023-01-23 NOTE — H&P (Signed)
NAME:  [name], MRN:  [id number], DOB:  May 11, 1986, LOS: 0 ADMISSION DATE:  01/23/2023, CONSULTATION DATE:  01/23/23  REFERRING MD: ED Dr., CHIEF COMPLAINT: Weakness  History of Present Illness:  37 year old man presents to the ED with weakness found to have hemoglobin of 3.  He denies any hematemesis.  No hemoptysis.  No hematochezia.  Denies melena.  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  Last drink 2 days prior to admission.  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  He was hospitalized 10/2022 with GI bleed.  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  Also some portal hypertensive gastropathy.  At home he takes Lasix and spironolactone for lower extremity edema.  He also takes lactulose.  Upon arrival, he was tachycardic to the 170s and SVT.  He was cardioverted.  He remained normotensive.  He remains tachycardic but less so.  His T. bili seems at baseline.  LFTs seem at recent baseline as well.  Last month notable for acute renal failure, severe elevation in creatinine.  Pertinent  Medical History  Alcoholic cirrhosis, known history of varices, known history of portal hypertensive gastropathy  Significant Hospital Events: Including procedures, antibiotic start and stop dates in addition to other pertinent events   01/23/2023 presents to ED with generalized weakness over the last several days, hemoglobin of 3, denies signs of GI bleeding  Interim History / Subjective:    Objective   Blood pressure 123/68, pulse (!) 113, resp. rate (!) 33, height  (1.753 m), weight 99.8 kg, SpO2 100 %.       No intake or output data in the 24 hours ending 01/23/23 1029 Filed Weights   01/23/23 0850  Weight: 99.8 kg    Examination: General: Sitting up in bed, no acute distress HENT: Atraumatic dry mucous membranes, significant scleral icterus Lungs: Work of breathing, on room air Cardiovascular: Tachycardic,  warm Abdomen: Soft, nondistended Extremities: Ruddy erythematous changes, chronic venous stasis changes with significant edema Neuro: Alert oriented no focal deficits  Resolved Hospital Problem list     Assessment & Plan:  Acute blood loss anemia: Suspected GI source given history of cirrhosis and prior findings on EGD, portal hypertensive gastropathy, varices.  No hematemesis.  Denies melena. -- N.p.o. -- Octreotide drip, PPI IV twice daily -- 4 units transfusion ordered for hemoglobin of 3, goal hemoglobin 7 -- Avoid over resuscitation given possibility of variceal bleed, holding other blood products at this time, additional fluid bolus at this time given normotension -- GI consultation, appreciate assistance  Acute kidney failure: Likely in setting of hypovolemia. -- Blood transfusion -- Hold home Lasix -- Consider additional fluids -- Urine sodium  Hyponatremia: Suspect hypovolemic given blood loss.  He does have lower extremity swelling that appears chronic in nature and he says it is similar to his chronic swelling.  Favor hypovolemia. -- Continue to monitor  LFT elevation, hyperbilirubinemia: In the setting of cirrhosis, bilirubin appears near more recent baseline. -- Continue to monitor  Cirrhosis: Presumed alcoholic cirrhosis.  He states he was sober for 8 months but relapsed 2 days ago, last drink 2 days prior to admission.  Hepatic encephalopathy: His mental status is clear and coherent. -- Hold lactulose now that to worsen any hypovolemic state with increased bowel movements, resume as able and once resuscitated  Best Practice (right click and "Reselect all SmartList Selections" daily)   Diet/type: NPO w/ oral meds DVT  prophylaxis: SCD, active GI bleed GI prophylaxis: N/A Lines: N/A Foley:  N/A Code Status:  full code Last date of multidisciplinary goals of care discussion [discussed CODE STATUS with patient admission, he confirms full code]  Labs    CBC: Recent Labs  Lab 01/23/23 0905 01/23/23 0914  WBC 6.1  --   NEUTROABS 4.0  --   HGB 3.2* 5.1*  HCT 11.3* 15.0*  MCV 78.5*  --   PLT 222  --     Basic Metabolic Panel: Recent Labs  Lab 01/23/23 0905 01/23/23 0914  NA 130* 134*  K 3.2* 3.2*  CL 95* 95*  CO2 21*  --   GLUCOSE 115* 114*  BUN 17 15  CREATININE 1.60* 1.80*  CALCIUM 7.7*  --   MG 1.8  --    GFR: Estimated Creatinine Clearance: 64.1 mL/min (A) (by C-G formula based on SCr of 1.8 mg/dL (H)). Recent Labs  Lab 01/23/23 0905  WBC 6.1    Liver Function Tests: Recent Labs  Lab 01/23/23 0905  AST 62*  ALT 18  ALKPHOS 79  BILITOT 12.1*  PROT 9.1*  ALBUMIN 2.3*   No results for input(s): "LIPASE", "AMYLASE" in the last 168 hours. No results for input(s): "AMMONIA" in the last 168 hours.  ABG    Component Value Date/Time   TCO2 22 01/23/2023 0914     Coagulation Profile: Recent Labs  Lab 01/23/23 0905  INR 2.5*    Cardiac Enzymes: No results for input(s): "CKTOTAL", "CKMB", "CKMBINDEX", "TROPONINI" in the last 168 hours.  HbA1C: Hgb A1c MFr Bld  Date/Time Value Ref Range Status  01/12/2022 03:36 PM 4.9 4.8 - 5.6 % Final    Comment:    (NOTE) Pre diabetes:          5.7%-6.4%  Diabetes:              >6.4%  Glycemic control for   <7.0% adults with diabetes     CBG: No results for input(s): "GLUCAP" in the last 168 hours.  Review of Systems:   No chest pain with exertion.  No orthopnea or PND.  Comprehensive review of systems otherwise negative.  Past Medical History:  He,  has a past medical history of Liver cirrhosis, alcoholic, Portal hypertension, and Splenomegaly, congestive, chronic.   Surgical History:   Past Surgical History:  Procedure Laterality Date   COSMETIC SURGERY     ESOPHAGOGASTRODUODENOSCOPY N/A 04/26/2021   Procedure: ESOPHAGOGASTRODUODENOSCOPY (EGD);  Surgeon: Willis Modena, MD;  Location: [facility];  Service: Endoscopy;  Laterality: N/A;    ESOPHAGOGASTRODUODENOSCOPY (EGD) WITH PROPOFOL N/A 11/01/2022   Procedure: ESOPHAGOGASTRODUODENOSCOPY (EGD) WITH PROPOFOL;  Surgeon: Vida Rigger, MD;  Location: [facility];  Service: Gastroenterology;  Laterality: N/A;   MANDIBLE FRACTURE SURGERY       Social History:   reports that he has never smoked. He has never used smokeless tobacco. He reports current alcohol use. He reports that he does not use drugs.   Family History:  His family history is not on file.   Allergies No Known Allergies   Home Medications  Prior to Admission medications   Medication Sig Start Date End Date Taking? Authorizing Provider  ferrous sulfate 325 (65 FE) MG tablet Take 1 tablet (325 mg total) by mouth daily with breakfast. Patient not taking: Reported on 10/25/2022 07/30/22 10/25/22  Pollyann Savoy, MD  furosemide (LASIX) 40 MG tablet Take 1 tablet (40 mg total) by mouth daily. 11/02/22   Narda Bonds,  MD  lactulose (CHRONULAC) 10 GM/15ML solution Take 45 mLs (30 g total) by mouth 3 (three) times daily. 11/02/22 01/31/23  Narda Bonds, MD  pantoprazole (PROTONIX) 40 MG tablet Take 1 tablet (40 mg total) by mouth 2 (two) times daily. 11/02/22 01/31/23  Narda Bonds, MD  propranolol (INDERAL) 10 MG tablet Take 1 tablet (10 mg total) by mouth 2 (two) times daily. 11/02/22 01/31/23  Narda Bonds, MD  spironolactone (ALDACTONE) 50 MG tablet Take 1 tablet (50 mg total) by mouth daily. 11/02/22 01/31/23  Narda Bonds, MD     Critical care time:    CRITICAL CARE Performed by: Karren Burly   Total critical care time: 45 minutes  Critical care time was exclusive of separately billable procedures and treating other patients.  Critical care was necessary to treat or prevent imminent or life-threatening deterioration.  Critical care was time spent personally by me on the following activities: development of treatment plan with patient and/or surrogate as well as nursing, discussions with consultants,  evaluation of patient's response to treatment, examination of patient, obtaining history from patient or surrogate, ordering and performing treatments and interventions, ordering and review of laboratory studies, ordering and review of radiographic studies, pulse oximetry and re-evaluation of patient's condition.   Karren Burly, MD See Loretha Stapler for contact info

## 2023-01-23 NOTE — Progress Notes (Signed)
Assisted with monitoring patient during Cardioversion . CO2 monitoring ranged from 28-32 mmHg, RR 22-24. PT is now awake and responsive. RN remains at bedside. RN confirm RT not needed at this time.

## 2023-01-23 NOTE — Progress Notes (Signed)
[facility] Physician-Brief Progress Note Patient Name: [name] DOB: 1986-02-06 MRN: [id number]   Date of Service  01/23/2023  HPI/Events of Note  Alcoholic cirrhosis.  Presented with hgb 3.2.  No obvious acute bleeding.  Has received 4 units prbc and hgb now 5.3.  Also has developed hematuria which is new since admission.  Has had 425 of bloody urine since presentation.  eICU Interventions  Transfuse another 2 unit prbc and 2 unit FFP Discuss case with IR and urology     Intervention Category Major Interventions: OtherHenry Russel, Demetrius Charity 01/23/2023, 10:39 PM

## 2023-01-23 NOTE — Progress Notes (Signed)
RN at [facility] notified, hbg 5.3, respirations 40-mid 50's, and blood with small clots with a total of 400 mL's since the start of shift. Dr. Katrinka Blazing from [facility] came into room via camera and consulted with this nurse and patient. Patient was alert and able to answer all questions.

## 2023-01-23 NOTE — Progress Notes (Signed)
e[facility] Physician-Brief Progress Note Patient Name: Carden Teel DOB: Mar 06, 1986 MRN: [id number]   Date of Service  01/23/2023  HPI/Events of Note  Case reviewed with both IR and urology who will evaluate the patient tomorrow.  eICU Interventions          Henry Russel, Demetrius Charity 01/23/2023, 11:34 PM

## 2023-01-23 NOTE — Consult Note (Signed)
WOC Nurse Consult Note: Reason for Consult: L leg wound  Wound type: traumatic Pressure Injury POA: NA  Measurement: 3 cms x 2 cms x 0.4 cm  Wound bed: 50% yellow devitalized tissue 50% yellow fibrin  Drainage (amount, consistency, odor) minimal tan exudate  Periwound: legs edematous, tissue around wound hardened  Dressing procedure/placement/frequency: Clean L lateral leg wound with NS, apply Medihoney to wound bed daily, fill rest of wound with saline moistened gauze, cover with dry gauze and silicone foam. May lift foam to reapply Medihoney and saline moist gauze daily.  Change silicone foam q3 days and prn soiling.   Patient originally stated he hit his leg at work and this wound had been present for 2-3 weeks, family in room states it has been there for months.  This wound has the appearance of having been present for months.  I did encourage patient that he should follow up with wound center or other provider for wound assessment and care after discharge from hospital.   POC discussed with patient and bedside nurse.  WOC team will not follow at this time.  Re-consult if further needs arise.   Thank you,    Priscella Mann MSN, RN-BC, [employer] [phone]

## 2023-01-23 NOTE — ED Provider Notes (Signed)
[place] EMERGENCY DEPARTMENT AT [facility] Provider Note   CSN: [id number] Arrival date & time: 01/23/23  8295     History  Chief Complaint  Patient presents with   Fatigue    [name] is a 37 y.o. male.  37 year old male with a history of alcoholic cirrhosis with esophageal varices, thrombocytopenia, alcohol abuse to the emergency department with palpitations and fatigue.  Reports that over the past week has had some shortness of breath, rapid heart rate, and fatigue.  Unsure of any bloody stools.  Last drink was 2 days ago.  Says that the fatigue and palpitations had worsened so he decided to come in to be evaluated today.  Denies any fevers.  No significant abdominal pain.      Home Medications Prior to Admission medications   Medication Sig Start Date End Date Taking? Authorizing Provider  furosemide (LASIX) 40 MG tablet Take 1 tablet (40 mg total) by mouth daily. 11/02/22  Yes Narda Bonds, MD  spironolactone (ALDACTONE) 50 MG tablet Take 1 tablet (50 mg total) by mouth daily. 11/02/22 01/31/23 Yes Narda Bonds, MD  ferrous sulfate 325 (65 FE) MG tablet Take 1 tablet (325 mg total) by mouth daily with breakfast. Patient not taking: Reported on 10/25/2022 07/30/22 10/25/22  Pollyann Savoy, MD  lactulose (CHRONULAC) 10 GM/15ML solution Take 45 mLs (30 g total) by mouth 3 (three) times daily. Patient not taking: Reported on 01/23/2023 11/02/22 01/31/23  Narda Bonds, MD  pantoprazole (PROTONIX) 40 MG tablet Take 1 tablet (40 mg total) by mouth 2 (two) times daily. Patient not taking: Reported on 01/23/2023 11/02/22 01/31/23  Narda Bonds, MD  propranolol (INDERAL) 10 MG tablet Take 1 tablet (10 mg total) by mouth 2 (two) times daily. Patient not taking: Reported on 01/23/2023 11/02/22 01/31/23  Narda Bonds, MD      Allergies    Patient has no known allergies.    Review of Systems   Review of Systems  Physical Exam Updated Vital Signs BP (!) 107/52    Pulse 92   Temp 99.2 F (37.3 C) (Axillary)   Resp (!) 23   Ht 5\' 9"  (1.753 m)   Wt 99.8 kg   SpO2 100%   BMI 32.49 kg/m  Physical Exam Vitals and nursing note reviewed.  Constitutional:      General: He is in acute distress.     Appearance: He is well-developed. He is ill-appearing.  HENT:     Head: Normocephalic and atraumatic.     Right Ear: External ear normal.     Left Ear: External ear normal.     Nose: Nose normal.  Eyes:     General: Scleral icterus present.     Extraocular Movements: Extraocular movements intact.     Conjunctiva/sclera: Conjunctivae normal.     Pupils: Pupils are equal, round, and reactive to light.  Cardiovascular:     Rate and Rhythm: Regular rhythm. Tachycardia present.     Heart sounds: Normal heart sounds.  Pulmonary:     Effort: Pulmonary effort is normal. No respiratory distress.     Breath sounds: Normal breath sounds.  Abdominal:     General: There is distension.     Palpations: Abdomen is soft. There is no mass.     Tenderness: There is no abdominal tenderness. There is no guarding.  Musculoskeletal:     Cervical back: Normal range of motion and neck supple.     Right lower  leg: Edema present.     Left lower leg: Edema present.  Skin:    General: Skin is warm and dry.  Neurological:     Mental Status: He is alert. Mental status is at baseline.  Psychiatric:        Mood and Affect: Mood normal.        Behavior: Behavior normal.     ED Results / Procedures / Treatments   Labs (all labs ordered are listed, but only abnormal results are displayed) Labs Reviewed  COMPREHENSIVE METABOLIC PANEL - Abnormal; Notable for the following components:      Result Value   Sodium 130 (*)    Potassium 3.2 (*)    Chloride 95 (*)    CO2 21 (*)    Glucose, Bld 115 (*)    Creatinine, Ser 1.60 (*)    Calcium 7.7 (*)    Total Protein 9.1 (*)    Albumin 2.3 (*)    AST 62 (*)    Total Bilirubin 12.1 (*)    GFR, Estimated 56 (*)    All other  components within normal limits  BRAIN NATRIURETIC PEPTIDE - Abnormal; Notable for the following components:   B Natriuretic Peptide 1,344.4 (*)    All other components within normal limits  CBC WITH DIFFERENTIAL/PLATELET - Abnormal; Notable for the following components:   RBC 1.44 (*)    Hemoglobin 3.2 (*)    HCT 11.3 (*)    MCV 78.5 (*)    MCH 22.2 (*)    MCHC 28.3 (*)    RDW 27.2 (*)    nRBC 4.6 (*)    Monocytes Absolute 1.2 (*)    All other components within normal limits  PROTIME-INR - Abnormal; Notable for the following components:   Prothrombin Time 26.4 (*)    INR 2.5 (*)    All other components within normal limits  APTT - Abnormal; Notable for the following components:   aPTT 48 (*)    All other components within normal limits  ETHANOL - Abnormal; Notable for the following components:   Alcohol, Ethyl (B) 125 (*)    All other components within normal limits  AMMONIA - Abnormal; Notable for the following components:   Ammonia 37 (*)    All other components within normal limits  CBC - Abnormal; Notable for the following components:   RBC 2.10 (*)    Hemoglobin 5.3 (*)    HCT 17.2 (*)    MCH 25.2 (*)    RDW 21.2 (*)    nRBC 3.4 (*)    All other components within normal limits  GLUCOSE, CAPILLARY - Abnormal; Notable for the following components:   Glucose-Capillary 106 (*)    All other components within normal limits  GLUCOSE, CAPILLARY - Abnormal; Notable for the following components:   Glucose-Capillary 113 (*)    All other components within normal limits  GLUCOSE, CAPILLARY - Abnormal; Notable for the following components:   Glucose-Capillary 105 (*)    All other components within normal limits  BLOOD GAS, ARTERIAL - Abnormal; Notable for the following components:   pO2, Arterial 79 (*)    All other components within normal limits  GLUCOSE, CAPILLARY - Abnormal; Notable for the following components:   Glucose-Capillary 107 (*)    All other components within  normal limits  BASIC METABOLIC PANEL - Abnormal; Notable for the following components:   Sodium 129 (*)    Chloride 94 (*)    Glucose, Bld 106 (*)  Creatinine, Ser 1.50 (*)    Calcium 7.9 (*)    All other components within normal limits  BLOOD GAS, ARTERIAL - Abnormal; Notable for the following components:   pH, Arterial 7.47 (*)    pO2, Arterial 244 (*)    Acid-Base Excess 2.7 (*)    All other components within normal limits  CBC - Abnormal; Notable for the following components:   RBC 2.56 (*)    Hemoglobin 6.7 (*)    HCT 21.7 (*)    RDW 19.5 (*)    nRBC 7.1 (*)    All other components within normal limits  URINALYSIS, W/ REFLEX TO CULTURE (INFECTION SUSPECTED) - Abnormal; Notable for the following components:   Color, Urine AMBER (*)    Hgb urine dipstick MODERATE (*)    Bilirubin Urine SMALL (*)    Protein, ur 30 (*)    Bacteria, UA RARE (*)    All other components within normal limits  COMPREHENSIVE METABOLIC PANEL - Abnormal; Notable for the following components:   Sodium 130 (*)    Potassium 3.4 (*)    Chloride 97 (*)    Creatinine, Ser 1.68 (*)    Calcium 7.7 (*)    Albumin 2.3 (*)    AST 43 (*)    Total Bilirubin 14.4 (*)    GFR, Estimated 53 (*)    All other components within normal limits  GLUCOSE, CAPILLARY - Abnormal; Notable for the following components:   Glucose-Capillary 103 (*)    All other components within normal limits  CBC - Abnormal; Notable for the following components:   RBC 2.65 (*)    Hemoglobin 7.0 (*)    HCT 22.2 (*)    RDW 19.1 (*)    nRBC 6.3 (*)    All other components within normal limits  I-STAT CHEM 8, ED - Abnormal; Notable for the following components:   Sodium 134 (*)    Potassium 3.2 (*)    Chloride 95 (*)    Creatinine, Ser 1.80 (*)    Glucose, Bld 114 (*)    Calcium, Ion 0.99 (*)    Hemoglobin 5.1 (*)    HCT 15.0 (*)    All other components within normal limits  RESP PANEL BY RT-PCR (RSV, FLU A&B, COVID)  RVPGX2   MRSA NEXT GEN BY PCR, NASAL  CULTURE, RESPIRATORY W GRAM STAIN  MAGNESIUM  HEMOGLOBIN A1C  GLUCOSE, CAPILLARY  MAGNESIUM  PHOSPHORUS  PROCALCITONIN  GLUCOSE, CAPILLARY  GLUCOSE, CAPILLARY  RAPID URINE DRUG SCREEN, HOSP PERFORMED  URINALYSIS, ROUTINE W REFLEX MICROSCOPIC  TRIGLYCERIDES  PROCALCITONIN  COMPREHENSIVE METABOLIC PANEL  PROTIME-INR  PROTIME-INR  POC OCCULT BLOOD, ED  SAMPLE TO BLOOD BANK  TYPE AND SCREEN  PREPARE RBC (CROSSMATCH)  PREPARE RBC (CROSSMATCH)  PREPARE RBC (CROSSMATCH)  PREPARE FRESH FROZEN PLASMA  PREPARE RBC (CROSSMATCH)    EKG EKG Interpretation  Date/Time:  Thursday January 23 2023 09:26:43 EDT Ventricular Rate:  113 PR Interval:  130 QRS Duration: 98 QT Interval:  351 QTC Calculation: 482 R Axis:   74 Text Interpretation: Atrial fibrillation Abnormal R-wave progression, early transition Borderline repol abnrm, anterolateral leads Borderline prolonged QT interval Artifact in lead(s) I when compared to prior, similar but slighly slower rate. No STEMI Confirmed by Theda Belfast ([id number]) on 01/24/2023 7:15:09 AM  Radiology ECHOCARDIOGRAM COMPLETE  Result Date: 01/24/2023    ECHOCARDIOGRAM REPORT   Patient Name:   PRAVIN PEREZPEREZ Date of Exam: 01/24/2023 Medical Rec #:  [id number]  Height:       69.0 in Accession #:    [id number]     Weight:       220.0 lb Date of Birth:  1985-11-17       BSA:          2.151 m Patient Age:    37 years       BP:           145/62 mmHg Patient Gender: M              HR:           99 bpm. Exam Location:  Inpatient Procedure: 2D Echo, Color Doppler and Cardiac Doppler Indications:    CHF  History:        Patient has prior history of Echocardiogram examinations, most                 recent 01/12/2022. Risk Factors:Hypertension.  Sonographer:    Milbert Coulter Referring Phys: [phone] [facility]  Sonographer Comments: Echo performed with patient supine and on artificial respirator. IMPRESSIONS  1. Left ventricular ejection  fraction, by estimation, is 50 to 55%. The left ventricle has low normal function. The left ventricle has no regional wall motion abnormalities. There is mild left ventricular hypertrophy. Left ventricular diastolic parameters are indeterminate.  2. Right ventricular systolic function is normal. The right ventricular size is moderately enlarged. There is mildly elevated pulmonary artery systolic pressure.  3. Right atrial size was mildly dilated.  4. No evidence of mitral valve regurgitation.  5. Tricuspid valve regurgitation is mild to moderate.  6. The aortic valve was not well visualized. Aortic valve regurgitation is not visualized.  7. Aortic not well visualized.  8. The inferior vena cava is dilated in size with <50% respiratory variability, suggesting right atrial pressure of 15 mmHg. FINDINGS  Left Ventricle: Left ventricular ejection fraction, by estimation, is 50 to 55%. The left ventricle has low normal function. The left ventricle has no regional wall motion abnormalities. The left ventricular internal cavity size was normal in size. There is mild left ventricular hypertrophy. Left ventricular diastolic parameters are indeterminate. Right Ventricle: The right ventricular size is moderately enlarged. Right ventricular systolic function is normal. There is mildly elevated pulmonary artery systolic pressure. The tricuspid regurgitant velocity is 2.69 m/s, and with an assumed right atrial pressure of 15 mmHg, the estimated right ventricular systolic pressure is 43.9 mmHg. Left Atrium: Left atrial size was normal in size. Right Atrium: Right atrial size was mildly dilated. Pericardium: There is no evidence of pericardial effusion. Mitral Valve: No evidence of mitral valve regurgitation. Tricuspid Valve: Tricuspid valve regurgitation is mild to moderate. Aortic Valve: The aortic valve was not well visualized. Aortic valve regurgitation is not visualized. Pulmonic Valve: Pulmonic valve regurgitation is not  visualized. Aorta: Not well visualized. Venous: The inferior vena cava is dilated in size with less than 50% respiratory variability, suggesting right atrial pressure of 15 mmHg. IAS/Shunts: The interatrial septum was not well visualized.  LEFT VENTRICLE PLAX 2D LVIDd:         5.80 cm      Diastology LVIDs:         4.50 cm      LV e' medial:    9.79 cm/s LV PW:         1.10 cm      LV E/e' medial:  12.6 LV IVS:        1.10 cm  LV e' lateral:   11.70 cm/s LVOT diam:     2.10 cm      LV E/e' lateral: 10.5 LVOT Area:     3.46 cm  LV Volumes (MOD) LV vol d, MOD A2C: 123.0 ml LV vol d, MOD A4C: 134.0 ml LV vol s, MOD A2C: 61.3 ml LV vol s, MOD A4C: 75.1 ml LV SV MOD A2C:     61.7 ml LV SV MOD A4C:     134.0 ml LV SV MOD BP:      60.2 ml RIGHT VENTRICLE RV Basal diam:  6.60 cm RV Mid diam:    5.60 cm RV S prime:     12.20 cm/s TAPSE (M-mode): 2.5 cm LEFT ATRIUM             Index        RIGHT ATRIUM           Index LA diam:        4.10 cm 1.91 cm/m   RA Area:     25.30 cm LA Vol (A2C):   53.6 ml 24.91 ml/m  RA Volume:   91.40 ml  42.48 ml/m LA Vol (A4C):   54.3 ml 25.24 ml/m LA Biplane Vol: 56.5 ml 26.26 ml/m  MITRAL VALVE                TRICUSPID VALVE MV Area (PHT): 3.42 cm     TR Peak grad:   28.9 mmHg MV Decel Time: 222 msec     TR Vmax:        269.00 cm/s MV E velocity: 123.00 cm/s MV A velocity: 135.00 cm/s  SHUNTS MV E/A ratio:  0.91         Systemic Diam: 2.10 cm Carolan Clines Electronically signed by Carolan Clines Signature Date/Time: 01/24/2023/10:37:05 AM    Final    US RENAL  Result Date: 01/24/2023 CLINICAL DATA:  Hematuria EXAM: RENAL / URINARY TRACT ULTRASOUND COMPLETE COMPARISON:  CT 10/26/2022 FINDINGS: Right Kidney: Renal measurements: 12.1 x 6.5 x 6.8 cm = volume: 281 mL. Normal cortical thickness and echogenicity. Suboptimally visualized due to shadowing related to body habitus. No gross evidence of mass or hydronephrosis. Left Kidney: Renal measurements: 11.9 x 6.9 x 6.5 cm = volume: 281 mL.  Normal cortical thickness and echogenicity. Suboptimally visualized due to shadowing from body habitus. No mass or hydronephrosis. Bladder: Decompressed by Foley catheter, inadequately assessed Other: No gross shadowing calculi are seen at either kidney though assessment is limited. IMPRESSION: No gross evidence of renal mass or hydronephrosis. Electronically Signed   By: Ulyses Southward M.D.   On: 01/24/2023 10:03   DG CHEST PORT 1 VIEW  Result Date: 01/24/2023 CLINICAL DATA:  Intubation EXAM: PORTABLE CHEST 1 VIEW COMPARISON:  Portable exam 0759 hours compared to 01/24/2023 at 0731 hours FINDINGS: Tip of endotracheal tube now projects 5.8 cm above carina. Nasogastric tube extends into stomach. Enlargement of cardiac silhouette with pulmonary vascular congestion. Perihilar infiltrates favoring pulmonary edema. Small bibasilar effusions. No pneumothorax. IMPRESSION: CHF with small bibasilar effusions. Tip of endotracheal tube now projects 5.8 cm above carina. Electronically Signed   By: Ulyses Southward M.D.   On: 01/24/2023 08:24   DG CHEST PORT 1 VIEW  Result Date: 01/24/2023 CLINICAL DATA:  Status post intubation. EXAM: PORTABLE CHEST 1 VIEW COMPARISON:  Earlier today FINDINGS: The ET tube tip is approximately 1.5 cm above the carina directed towards the right mainstem bronchus. Cardiac enlargement. Bilateral pleural effusions and interstitial  edema appears similar to the previous exam. Visualized osseous structures are unremarkable. IMPRESSION: The ET tube tip is approximately 1.5 cm above the carina directed towards the right mainstem bronchus. Recommend retracting approximately 1-2 cm. No change in CHF pattern. These results will be called to the ordering clinician or representative by the Radiologist Assistant, and communication documented in the PACS or [employer]. Electronically Signed   By: Signa Kell M.D.   On: 01/24/2023 07:43   DG CHEST PORT 1 VIEW  Result Date: 01/24/2023 CLINICAL DATA:   Respiratory distress EXAM: PORTABLE CHEST 1 VIEW COMPARISON:  01/23/2023 FINDINGS: Cardiomegaly with vascular congestion. Bilateral airspace opacities most compatible with moderate edema. Probable layering bilateral effusions. No acute bony abnormality. IMPRESSION: Moderate CHF Electronically Signed   By: Charlett Nose M.D.   On: 01/24/2023 01:40   CT ANGIO ABD/PELVIS BRTO  Result Date: 01/23/2023 CLINICAL DATA:  Gastric varices severe anemia, gastric and duodenal varices, may need embolization EXAM: CTA ABDOMEN AND PELVIS WITHOUT AND WITH CONTRAST TECHNIQUE: Multidetector CT imaging of the abdomen and pelvis was performed using the standard protocol during bolus administration of intravenous contrast. Multiplanar reconstructed images and MIPs were obtained and reviewed to evaluate the vascular anatomy. RADIATION DOSE REDUCTION: This exam was performed according to the departmental dose-optimization program which includes automated exposure control, adjustment of the mA and/or kV according to patient size and/or use of iterative reconstruction technique. CONTRAST:  OMNIPAQUE IOHEXOL 350 MG/ML SOLN COMPARISON:  10/26/2022 FINDINGS: VASCULAR Aorta: Normal caliber aorta without aneurysm, dissection, vasculitis or significant stenosis. Celiac: Patent without evidence of aneurysm, dissection, vasculitis or significant stenosis. SMA: Patent without evidence of aneurysm, dissection, vasculitis or significant stenosis. Renals: Both renal arteries are patent without evidence of aneurysm, dissection, vasculitis, fibromuscular dysplasia or significant stenosis. IMA: Patent without evidence of aneurysm, dissection, vasculitis or significant stenosis. Inflow: Patent without evidence of aneurysm, dissection, vasculitis or significant stenosis. Proximal Outflow: Bilateral common femoral and visualized portions of the superficial and profunda femoral arteries are patent without evidence of aneurysm, dissection, vasculitis  or significant stenosis. Veins: Patent hepatic veins, portal vein, SM V. There is a large venous collateral from the SMV to the left retroperitoneum ultimately draining into the left renal vein. There is a large tortuous left splenorenal shunt, with small gastric varices. Mild esophageal varices near the GE junction. Iliac venous system patent. 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 Review of the MIP images confirms the above findings. NON-VASCULAR Lower chest: Small pleural effusions right greater than left. Dependent atelectasis/consolidation in the lower lobes worse right than left. Hepatobiliary: Short-term liver with nodular contour. No mass or biliary ductal dilatation. Gallbladder physiologically distended with multiple small partially calcified layering stones in its dependent aspect. Pancreas: Unremarkable. No pancreatic ductal dilatation or surrounding inflammatory changes. Spleen: Borderline splenomegaly 12.4 cm maximum length. No focal lesion. Patent splenic vein with large left retroperitoneal splenorenal shunt. Adrenals/Urinary Tract: No adrenal mass. Symmetric renal parenchymal enhancement. No urolithiasis or hydronephrosis. Urinary bladder incompletely distended. Stomach/Bowel: Stomach is partially distended, unremarkable. Small bowel decompressed. The colon is incompletely distended by gas and fecal material, unremarkable. Lymphatic: No adenopathy. Reproductive: Prostate is unremarkable. Other: Left pelvic phleboliths.  No ascites.  No free air. Musculoskeletal: Body wall anasarca.  Regional bones unremarkable. IMPRESSION: 1. Cirrhotic liver with borderline splenomegaly, large portosystemic venous collaterals, small gastric and esophageal varices. 2. No ascites. 3. Cholelithiasis. 4. Small bilateral pleural effusions right greater than left. Electronically Signed   By: Ronald Pippins.D.  On: 01/23/2023 12:50   DG Chest Port 1 View  Result Date: 01/23/2023 CLINICAL DATA:   Shortness of breath and tachycardia. EXAM: PORTABLE CHEST 1 VIEW COMPARISON:  01/12/2022 FINDINGS: The heart is enlarged but stable. External pacer paddles are noted. There is moderate vascular congestion and probable mild interstitial edema. Small bilateral pleural effusions are suspected. No infiltrates or pneumothorax. IMPRESSION: Cardiac enlargement, vascular congestion and probable mild interstitial edema. Suspect small bilateral pleural effusions. Electronically Signed   By: Rudie Meyer M.D.   On: 01/23/2023 09:50    Procedures .Sedation  Date/Time: 01/24/2023 5:21 PM  Performed by: Rondel Baton, MD Authorized by: Rondel Baton, MD   Consent:    Consent obtained:  Emergent situation Universal protocol:    Immediately prior to procedure, a time out was called: yes   Pre-sedation assessment:    Time since last food or drink:  Hours   NPO status caution: urgency dictates proceeding with non-ideal NPO status     ASA classification: class 4 - patient with severe systemic disease that is a constant threat to life     Mallampati score:  II - soft palate, uvula, fauces visible   Pre-sedation assessments completed and reviewed: pre-procedure airway patency not reviewed and pre-procedure mental status not reviewed   Immediate pre-procedure details:    Reviewed: vital signs     Verified: bag valve mask available, emergency equipment available, intubation equipment available, IV patency confirmed, oxygen available and suction available   Procedure details (see MAR for exact dosages):    Preoxygenation:  Nasal cannula   Sedation:  Etomidate   Intended level of sedation: deep   Intra-procedure monitoring:  Continuous capnometry, frequent LOC assessments, blood pressure monitoring, cardiac monitor, continuous pulse oximetry and frequent vital sign checks   Total Provider sedation time (minutes):  11 Post-procedure details:    Post-sedation assessments completed and reviewed:  post-procedure airway patency not reviewed and post-procedure mental status not reviewed     Procedure completion:  Tolerated well, no immediate complications .Cardioversion  Date/Time: 01/24/2023 5:22 PM  Performed by: Rondel Baton, MD Authorized by: Rondel Baton, MD   Consent:    Consent obtained:  Emergent situation Pre-procedure details:    Cardioversion basis:  Emergent   Rhythm:  Supraventricular tachycardia   Electrode placement:  Anterior-posterior Patient sedated: Yes. Refer to sedation procedure documentation for details of sedation.  Attempt one:    Cardioversion mode:  Synchronous   Shock (Joules):  200   Shock outcome:  Conversion to normal sinus rhythm Post-procedure details:    Patient status:  Alert   Patient tolerance of procedure:  Tolerated well, no immediate complications    Medications Ordered in ED Medications  thiamine (VITAMIN B1) tablet 100 mg (100 mg Oral Given 01/24/23 0952)    Or  thiamine (VITAMIN B1) injection 100 mg ( Intravenous See Alternative 01/24/23 0952)  folic acid (FOLVITE) tablet 1 mg (1 mg Oral Given 01/24/23 0952)  octreotide (SANDOSTATIN) 2 mcg/mL load via infusion 50 mcg (50 mcg Intravenous Bolus from Bag 01/23/23 1031)    And  octreotide (SANDOSTATIN) 500 mcg in sodium chloride 0.9 % 250 mL (2 mcg/mL) infusion (50 mcg/hr Intravenous New Bag/Given 01/24/23 1725)  pantoprazole (PROTONIX) injection 40 mg (40 mg Intravenous Given 01/24/23 0952)  docusate sodium (COLACE) capsule 100 mg (has no administration in time range)  polyethylene glycol (MIRALAX / GLYCOLAX) packet 17 g (has no administration in time range)  ondansetron (ZOFRAN) injection 4 mg (  has no administration in time range)  insulin aspart (novoLOG) injection 0-15 Units ( Subcutaneous Not Given 01/24/23 1526)  Oral care mouth rinse (has no administration in time range)  Chlorhexidine Gluconate Cloth 2 % PADS 6 each (6 each Topical Given 01/23/23 1242)  cefTRIAXone  (ROCEPHIN) 1 g in sodium chloride 0.9 % 100 mL IVPB (0 g Intravenous Stopped 01/24/23 0900)  leptospermum manuka honey (MEDIHONEY) paste 1 Application (1 Application Topical Given 01/24/23 1000)  fentaNYL in NS (89mcg/ml) infusion-PREMIX (175 mcg/hr Intravenous Infusion Verify 01/24/23 1200)  propofol (DIPRIVAN) 1000 MG/100ML infusion (70 mcg/kg/min  99.8 kg Intravenous New Bag/Given 01/24/23 1450)  Oral care mouth rinse (15 mLs Mouth Rinse Given 01/24/23 1640)  Oral care mouth rinse (has no administration in time range)  lactulose (CHRONULAC) 10 GM/15ML solution 20 g (20 g Oral Given 01/24/23 1519)  furosemide (LASIX) 120 mg in dextrose 5 % 50 mL IVPB (has no administration in time range)  etomidate (AMIDATE) injection 10 mg (10 mg Intravenous Given 01/23/23 0923)  0.9 %  sodium chloride infusion (Manually program via Guardrails IV Fluids) ( Intravenous New Bag/Given 01/23/23 1107)  magnesium sulfate IVPB 2 g 50 mL (0 g Intravenous Stopped 01/23/23 1200)  potassium chloride 10 mEq in 100 mL IVPB (0 mEq Intravenous Stopped 01/23/23 1359)  albumin human 25 % solution 25 g (0 g Intravenous Stopped 01/23/23 1300)  pantoprazole (PROTONIX) 80 mg /NS 100 mL IVPB (0 mg Intravenous Stopped 01/23/23 1408)  phytonadione (VITAMIN K) 10 mg in dextrose 5 % 50 mL IVPB (0 mg Intravenous Stopped 01/23/23 1240)  cefTRIAXone (ROCEPHIN) 2 g in sodium chloride 0.9 % 100 mL IVPB (0 g Intravenous Stopped 01/23/23 1048)  furosemide (LASIX) tablet 60 mg (60 mg Oral Given 01/23/23 1125)  iohexol (OMNIPAQUE) 350 MG/ML injection 100 mL (100 mLs Intravenous Contrast Given 01/23/23 1144)  0.9 %  sodium chloride infusion (Manually program via Guardrails IV Fluids) ( Intravenous New Bag/Given 01/23/23 1430)  0.9 %  sodium chloride infusion (Manually program via Guardrails IV Fluids) ( Intravenous New Bag/Given 01/23/23 2330)  furosemide (LASIX) injection 40 mg (40 mg Intravenous Given 01/24/23 0136)  etomidate (AMIDATE) injection  20 mg (20 mg Intravenous Given 01/24/23 0713)  succinylcholine (ANECTINE) syringe 100 mg (100 mg Intravenous Given 01/24/23 0707)  midazolam (VERSED) injection 4 mg (2 mg Intravenous Given 01/24/23 0710)  fentaNYL (SUBLIMAZE) injection 100 mcg (100 mcg Intravenous Given 01/24/23 0719)  fentaNYL (SUBLIMAZE) 50 MCG/ML injection (  Duplicate 01/24/23 0720)  phytonadione (VITAMIN K) 10 mg in dextrose 5 % 50 mL IVPB (0 mg Intravenous Stopped 01/24/23 1016)  furosemide (LASIX) injection 80 mg (80 mg Intravenous Given 01/24/23 0818)  potassium chloride 10 mEq in 100 mL IVPB (0 mEq Intravenous Stopped 01/24/23 1154)  magnesium sulfate IVPB 2 g 50 mL (0 g Intravenous Stopped 01/24/23 1626)    ED Course/ Medical Decision Making/ A&P Clinical Course as of 01/24/23 1728  Thu Jan 23, 2023  0948 Creatinine(!): 1.60 Baseline of 0.5 [RP]  1006 Matt Hundsucker from critical care consulted and will admit patient.  [RP]  1030 Dr Marca Ancona from GI aware. Recommends CTA at this time.  [RP]    Clinical Course User Index [RP] Rondel Baton, MD                            Medical Decision Making Amount and/or Complexity of Data Reviewed Labs: ordered. Decision-making details documented in ED  Course. Radiology: ordered.  Risk Prescription drug management. Decision regarding hospitalization.   Kearney Evitt is a 37 y.o. male with comorbidities that complicate the patient evaluation including alcoholic cirrhosis with esophageal varices, thrombocytopenia, alcohol abuse to the emergency department with palpitations and fatigue.   Initial Ddx:  SVT, pneumonia, URI, symptomatic anemia, volume overload, heart failure  MDM:  Feel the patient be having symptoms from SVT based on his tachycardia here in the emergency department.  Patient's blood pressure is borderline so we will perform cardioversion at this time rather than using medications as I feel this is safer for the patient.  Has been anemic in the past and has  a history of varices so we will send off type and screen at this time.  Will also test him for viruses as well.  Does appear somewhat volume overloaded so we will consider Lasix once his labs return.  Plan:  Labs BNP Pneumonia Coags Alcohol level COVID and flu Chest x-ray EKG Cardioversion  ED Summary/Re-evaluation:  Patient underwent successful cardioversion.  Remained stable afterwards with sinus tachycardia.  Labs returned and showed a hemoglobin of 3.  Patient was ordered for 4 units of blood.  Also found to have an AKI so was ordered for albumin.  Suspect the cause was variceal bleed so he was given ceftriaxone, Protonix, octreotide, and vitamin K.  Patient was admitted to the ICU.  Also discussed with GI who recommended a GIB CTA protocol on the way up to the ICU.  This patient presents to the ED for concern of complaints listed in HPI, this involves an extensive number of treatment options, and is a complaint that carries with it a high risk of complications and morbidity. Disposition including potential need for admission considered.   Dispo: ICU  Additional history obtained from spouse Records reviewed Outpatient Clinic Notes The following labs were independently interpreted: Chemistry and CBC and show acute anemia and AKI I independently reviewed the following imaging with scope of interpretation limited to determining acute life threatening conditions related to emergency care: Chest x-ray and agree with the radiologist interpretation with the following exceptions: None I personally reviewed and interpreted cardiac monitoring: sinus tachycardia I personally reviewed and interpreted the pt's EKG: see above for interpretation  I have reviewed the patients home medications and made adjustments as needed Consults: Gastroenterology and ICU Social Determinants of health:  Alcohol abuse  Final Clinical Impression(s) / ED Diagnoses Final diagnoses:  Symptomatic anemia  SVT  (supraventricular tachycardia)  Decompensated hepatic cirrhosis (HCC)  Alcohol abuse  Esophageal varices with bleeding in diseases classified elsewhere [facility])    Rx / DC Orders ED Discharge Orders     None      CRITICAL CARE Performed by: Rondel Baton   Total critical care time: 90 minutes  Critical care time was exclusive of separately billable procedures and treating other patients.  Critical care was necessary to treat or prevent imminent or life-threatening deterioration.  Critical care was time spent personally by me on the following activities: development of treatment plan with patient and/or surrogate as well as nursing, discussions with consultants, evaluation of patient's response to treatment, examination of patient, obtaining history from patient or surrogate, ordering and performing treatments and interventions, ordering and review of laboratory studies, ordering and review of radiographic studies, pulse oximetry and re-evaluation of patient's condition.    Rondel Baton, MD 01/24/23 1728

## 2023-01-23 NOTE — ED Triage Notes (Signed)
Pt c/o fatigue for "weeks". Pt also states he has intermittent rapid heart rate.  Denies cough, N/V/D. Endorses occasional SHOB.

## 2023-01-23 NOTE — Consult Note (Signed)
[facility] Consult  Referring Provider: ER Primary Care Physician:  Malka So., MD Primary Gastroenterologist: Gentry Fitz right  Reason for Consultation: Severe anemia, cirrhosis  HPI: [name] is a 37 y.o. male with history of alcohol related cirrhosis, came to the ER with complains of rapid heart rate for the past 1 week. On presentation to the ER his heart rate was 175/min, was given esmolol and cardioverted in the ER.  Patient was found to have severe anemia with a hemoglobin of 3.2, repeat labs showed hemoglobin 5.1. Patient denies noticing blood in stool, black stools, vomiting blood or coffee-ground emesis.  Patient states he has not had alcohol for the last several months however his fiance at bedside states he had alcohol 1 to 2 days ago. His fiance has noted some slowness in mental function, however he has remained oriented x 3.  Patient denies abdominal pain, nausea or vomiting. Denies acid reflux, heartburn, difficulty swallowing, pain on swallowing, unintentional weight loss or loss of appetite.  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  Patient was diagnosed with cirrhosis at least for the last few years as per his fiance.  His primary care physician is with [facility] and patient is on Lasix 40 mg once a day along with spironolactone 50 mg a day as diuretics for edema, propranolol 10 mg twice a day and lactulose 45  mL 3 times a day. It is unclear if patient has been taking these medications at home.   Past Medical History:  Diagnosis Date   Liver cirrhosis, alcoholic    Portal hypertension    Splenomegaly, congestive, chronic     Past Surgical History:  Procedure Laterality Date   COSMETIC SURGERY     ESOPHAGOGASTRODUODENOSCOPY N/A 04/26/2021   Procedure: ESOPHAGOGASTRODUODENOSCOPY (EGD);  Surgeon: Willis Modena, MD;  Location: [facility];  Service: Endoscopy;  Laterality: N/A;   ESOPHAGOGASTRODUODENOSCOPY (EGD) WITH PROPOFOL N/A 11/01/2022   Procedure: ESOPHAGOGASTRODUODENOSCOPY (EGD) WITH PROPOFOL;  Surgeon: Vida Rigger, MD;  Location: [facility];  Service: Gastroenterology;  Laterality: N/A;   MANDIBLE FRACTURE SURGERY      Prior to Admission medications   Medication Sig Start Date End Date Taking? Authorizing Provider  ferrous sulfate 325 (65 FE) MG tablet Take 1 tablet (325 mg total) by mouth daily with breakfast. Patient not taking: Reported on 10/25/2022 07/30/22 10/25/22  Pollyann Savoy, MD  furosemide (LASIX) 40 MG tablet Take 1 tablet (40 mg total) by mouth daily. 11/02/22   Narda Bonds, MD  lactulose (CHRONULAC) 10 GM/15ML solution Take 45 mLs (30 g total) by mouth 3 (three) times daily. 11/02/22 01/31/23  Narda Bonds, MD  pantoprazole (PROTONIX) 40 MG tablet Take 1 tablet (40 mg total) by mouth 2 (two) times daily. 11/02/22 01/31/23  Narda Bonds, MD  propranolol (INDERAL) 10 MG tablet Take 1 tablet (10 mg total) by mouth 2 (two) times daily. 11/02/22 01/31/23  Narda Bonds, MD  spironolactone (ALDACTONE) 50 MG tablet Take 1 tablet (50 mg total) by mouth daily. 11/02/22 01/31/23  Narda Bonds, MD    Current  Facility-Administered Medications  Medication Dose Route Frequency Provider Last Rate Last Admin   0.9 %  sodium chloride infusion (Manually program via Guardrails IV Fluids)   Intravenous Once Hunsucker, Lesia Sago, MD       albumin human 25 % solution 25 g  25 g  Intravenous Once Hunsucker, Lesia Sago, MD       cefTRIAXone (ROCEPHIN) 2 g in sodium chloride 0.9 % 100 mL IVPB  2 g Intravenous Once Hunsucker, Lesia Sago, MD 200 mL/hr at 01/23/23 1018 2 g at 01/23/23 1018   docusate sodium (COLACE) capsule 100 mg  100 mg Oral BID PRN Hunsucker, Lesia Sago, MD       folic acid (FOLVITE) tablet 1 mg  1 mg Oral Daily Hunsucker, Lesia Sago, MD   1 mg at 01/23/23 1015   insulin aspart (novoLOG) injection 0-15 Units  0-15 Units Subcutaneous Q4H Hunsucker, Lesia Sago, MD       LORazepam (ATIVAN) tablet 1-4 mg  1-4 mg Oral Q1H PRN Hunsucker, Lesia Sago, MD       magnesium sulfate IVPB 2 g 50 mL  2 g Intravenous Once Hunsucker, Lesia Sago, MD 50 mL/hr at 01/23/23 1025 2 g at 01/23/23 1025   multivitamin with minerals tablet 1 tablet  1 tablet Oral Daily Hunsucker, Lesia Sago, MD   1 tablet at 01/23/23 1015   octreotide (SANDOSTATIN) 500 mcg in sodium chloride 0.9 % 250 mL (2 mcg/mL) infusion  50 mcg/hr Intravenous Continuous Hunsucker, Lesia Sago, MD 25 mL/hr at 01/23/23 1030 50 mcg/hr at 01/23/23 1030   ondansetron (ZOFRAN) injection 4 mg  4 mg Intravenous Q6H PRN Hunsucker, Lesia Sago, MD       pantoprazole (PROTONIX) 80 mg /NS 100 mL IVPB  80 mg Intravenous Once Hunsucker, Lesia Sago, MD       pantoprazole (PROTONIX) injection 40 mg  40 mg Intravenous Q12H Hunsucker, Lesia Sago, MD       phytonadione (VITAMIN K) 10 mg in dextrose 5 % 50 mL IVPB  10 mg Intravenous Once Hunsucker, Lesia Sago, MD       polyethylene glycol (MIRALAX / GLYCOLAX) packet 17 g  17 g Oral Daily PRN Hunsucker, Lesia Sago, MD       potassium chloride 10 mEq in 100 mL IVPB  10 mEq Intravenous Q1 Hr x 3 Hunsucker, Lesia Sago, MD 100 mL/hr at 01/23/23 1024 10 mEq at 01/23/23 1024   thiamine (VITAMIN B1) tablet 100 mg  100 mg Oral Daily Hunsucker, Lesia Sago, MD   100 mg at 01/23/23 1014   Or   thiamine (VITAMIN B1) injection 100 mg  100 mg Intravenous Daily Hunsucker, Lesia Sago, MD       Current Outpatient  Medications  Medication Sig Dispense Refill   ferrous sulfate 325 (65 FE) MG tablet Take 1 tablet (325 mg total) by mouth daily with breakfast. (Patient not taking: Reported on 10/25/2022) 30 tablet 0   furosemide (LASIX) 40 MG tablet Take 1 tablet (40 mg total) by mouth daily. 30 tablet 2   lactulose (CHRONULAC) 10 GM/15ML solution Take 45 mLs (30 g total) by mouth 3 (three) times daily. 12150 mL 0   pantoprazole (PROTONIX) 40 MG tablet Take 1 tablet (40 mg total) by mouth 2 (two) times daily. 60 tablet 2   propranolol (INDERAL) 10 MG tablet Take 1 tablet (10 mg total) by mouth 2 (two) times daily. 60 tablet 2   spironolactone (ALDACTONE) 50 MG tablet Take 1 tablet (50  mg total) by mouth daily. 30 tablet 2    Allergies as of 01/23/2023   (No Known Allergies)    History reviewed. No pertinent family history.  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  Tobacco Use   Smoking status: Never   Smokeless tobacco: Never  Vaping Use   Vaping Use: Never used  Substance and Sexual Activity   Alcohol use: Yes    Comment: pt reports one pint of vodka when he drinks   Drug use: No   Sexual activity: Not on file  Other Topics Concern   Not on file  Social History Narrative   Not on file   Social Determinants of Health   Financial Resource Strain: Not on file  Food Insecurity: No Food Insecurity (10/25/2022)   Hunger Vital Sign    Worried About Running Out of Food in the Last Year: Never true    Ran Out of Food in the Last Year: Never true  Transportation Needs: No Transportation Needs (10/25/2022)   PRAPARE - Administrator, Civil Service (Medical): No    Lack of Transportation (Non-Medical): No  Physical Activity: Not on file  Stress: Not on file  Social  Connections: Not on file  Intimate Partner Violence: Not At Risk (10/25/2022)   Humiliation, Afraid, Rape, and Kick questionnaire    Fear of Current or Ex-Partner: No    Emotionally Abused: No    Physically Abused: No    Sexually Abused: No    Review of Systems: As per HPI Physical Exam: Vital signs in last 24 hours: Pulse Rate:  [111-175] 113 (04/25 0945) Resp:  [20-45] 33 (04/25 0945) BP: (117-148)/(52-76) 123/68 (04/25 0945) SpO2:  [97 %-100 %] 100 % (04/25 0945) Weight:  [99.8 kg] 99.8 kg (04/25 0850)    General:   Alert,  Well-developed, well-nourished, pleasant and cooperative in NAD Head:  Normocephalic and atraumatic. Eyes: Prominent pallor, deeply icteric. Ears:  Normal auditory acuity. Nose:  No deformity, discharge,  or lesions. Mouth:  No deformity or lesions.  Oropharynx pink & moist. Neck:  Supple; no masses or thyromegaly. Lungs:  Clear throughout to auscultation.   No wheezes, crackles, or rhonchi. No acute distress. Heart: Tachycardic Extremities: Chronic skin injuries with bipedal pitting edema Neurologic:  Alert and  oriented x4;  grossly normal neurologically.  No asterixis Skin:  Intact without significant lesions or rashes. Psych:  Alert and cooperative. Normal mood and affect. Abdomen:  Soft, nontender and nondistended. No masses, hepatosplenomegaly or hernias noted. Normal bowel sounds, without guarding, and without rebound.         Lab Results: Recent Labs    01/23/23 0905 01/23/23 0914  WBC 6.1  --   HGB 3.2* 5.1*  HCT 11.3* 15.0*  PLT 222  --    BMET Recent Labs    01/23/23 0905 01/23/23 0914  NA 130* 134*  K 3.2* 3.2*  CL 95* 95*  CO2 21*  --   GLUCOSE 115* 114*  BUN 17 15  CREATININE 1.60* 1.80*  CALCIUM 7.7*  --    LFT Recent Labs    01/23/23 0905  PROT 9.1*  ALBUMIN 2.3*  AST 62*  ALT 18  ALKPHOS 79  BILITOT 12.1*  PT/INR Recent Labs    01/23/23 0905  LABPROT 26.4*  INR 2.5*    Studies/Results: DG Chest  Port 1 View  Result Date: 01/23/2023 CLINICAL DATA:  Shortness of breath and tachycardia. EXAM: PORTABLE CHEST 1 VIEW COMPARISON:  01/12/2022 FINDINGS: The heart is enlarged but stable. External pacer paddles are noted. There is moderate vascular congestion and probable mild interstitial edema. Small bilateral pleural effusions are suspected. No infiltrates or pneumothorax. IMPRESSION: Cardiac enlargement, vascular congestion and probable mild interstitial edema. Suspect small bilateral pleural effusions. Electronically Signed   By: Rudie Meyer M.D.   On: 01/23/2023 09:50    Impression: Severe symptomatic anemia without obvious melena or hematochezia in the setting of cirrhosis with portal hypertension and duodenal and gastric varices  MEL D sodium score(sodium 134, creatinine 1.8, T. bili 12.1, INR 2.5) is 33 which carries a 65 to 66% estimated 90-day mortality  Plan: Recommend CT angio abdomen and pelvis with BRTO protocol and IR evaluation for embolization of gastric/duodenal varices. Without obvious melena and hematochezia, clinical picture compatible with chronic small amount of GI blood loss related to portal hypertension. Agree with PRBC transfusion, plan to keep hemoglobin more than 7. With elevated INR, vitamin K 10 mg IV x 1 has been ordered. Patient has been started on octreotide infusion at 50 mcg/h and Protonix 40 mg IV every 12 hours after Protonix 80 mg IV x 1 bolus. Agree with IV ceftriaxone, 2 g x 1 given, will need to continue IV antibiotics as he has cirrhosis and is admitted with anemia/GI bleed. Patient has been started on folic acid,multivitamin and thiamine.  Had a detailed discussion with the patient and his fiance at bedside. With coagulopathy, worsening LFTs and renal function, patient remains at high risk of rapid decompensation. He is being admitted to ICU. Guarded prognosis.   LOS: 0 days   Kerin Salen, MD  01/23/2023, 10:45 AM

## 2023-01-24 ENCOUNTER — Encounter (HOSPITAL_COMMUNITY): Payer: Self-pay | Admitting: Certified Registered Nurse Anesthetist

## 2023-01-24 ENCOUNTER — Inpatient Hospital Stay (HOSPITAL_COMMUNITY): Payer: Medicaid Other

## 2023-01-24 ENCOUNTER — Other Ambulatory Visit: Payer: Self-pay

## 2023-01-24 DIAGNOSIS — R310 Gross hematuria: Secondary | ICD-10-CM

## 2023-01-24 DIAGNOSIS — J9601 Acute respiratory failure with hypoxia: Secondary | ICD-10-CM | POA: Diagnosis not present

## 2023-01-24 DIAGNOSIS — I509 Heart failure, unspecified: Secondary | ICD-10-CM

## 2023-01-24 LAB — TYPE AND SCREEN: Unit division: 0

## 2023-01-24 LAB — GLUCOSE, CAPILLARY
Glucose-Capillary: 103 mg/dL — ABNORMAL HIGH (ref 70–99)
Glucose-Capillary: 105 mg/dL — ABNORMAL HIGH (ref 70–99)
Glucose-Capillary: 107 mg/dL — ABNORMAL HIGH (ref 70–99)
Glucose-Capillary: 86 mg/dL (ref 70–99)
Glucose-Capillary: 88 mg/dL (ref 70–99)
Glucose-Capillary: 92 mg/dL (ref 70–99)

## 2023-01-24 LAB — COMPREHENSIVE METABOLIC PANEL
ALT: 17 U/L (ref 0–44)
AST: 43 U/L — ABNORMAL HIGH (ref 15–41)
Albumin: 2.3 g/dL — ABNORMAL LOW (ref 3.5–5.0)
Alkaline Phosphatase: 66 U/L (ref 38–126)
Anion gap: 11 (ref 5–15)
BUN: 19 mg/dL (ref 6–20)
CO2: 22 mmol/L (ref 22–32)
Calcium: 7.7 mg/dL — ABNORMAL LOW (ref 8.9–10.3)
Chloride: 97 mmol/L — ABNORMAL LOW (ref 98–111)
Creatinine, Ser: 1.68 mg/dL — ABNORMAL HIGH (ref 0.61–1.24)
GFR, Estimated: 53 mL/min — ABNORMAL LOW (ref >60)
Glucose, Bld: 96 mg/dL (ref 70–99)
Potassium: 3.4 mmol/L — ABNORMAL LOW (ref 3.5–5.1)
Sodium: 130 mmol/L — ABNORMAL LOW (ref 135–145)
Total Bilirubin: 14.4 mg/dL — ABNORMAL HIGH (ref 0.3–1.2)
Total Protein: 8.1 g/dL (ref 6.5–8.1)

## 2023-01-24 LAB — BLOOD GAS, ARTERIAL
Acid-Base Excess: 2.7 mmol/L — ABNORMAL HIGH (ref 0.0–2.0)
Acid-base deficit: 0.5 mmol/L (ref 0.0–2.0)
Bicarbonate: 23.1 mmol/L (ref 20.0–28.0)
Bicarbonate: 26.2 mmol/L (ref 20.0–28.0)
Drawn by: 54496
Drawn by: 560031
FIO2: 100 %
MECHVT: 560 mL
O2 Saturation: 100 %
O2 Saturation: 100 %
PEEP: 8 cmH2O
Patient temperature: 37.5
Patient temperature: 36.7
RATE: 24 resp/min
pCO2 arterial: 35 mmHg (ref 32–48)
pCO2 arterial: 36 mmHg (ref 32–48)
pH, Arterial: 7.43 (ref 7.35–7.45)
pH, Arterial: 7.47 — ABNORMAL HIGH (ref 7.35–7.45)
pO2, Arterial: 79 mmHg — ABNORMAL LOW (ref 83–108)
pO2, Arterial: 244 mmHg — ABNORMAL HIGH (ref 83–108)

## 2023-01-24 LAB — HEMOGLOBIN A1C
Hgb A1c MFr Bld: 4.8 % (ref 4.8–5.6)
Mean Plasma Glucose: 91.06 mg/dL

## 2023-01-24 LAB — CBC
HCT: 21.7 % — ABNORMAL LOW (ref 39.0–52.0)
HCT: 22.2 % — ABNORMAL LOW (ref 39.0–52.0)
Hemoglobin: 6.7 g/dL — CL (ref 13.0–17.0)
Hemoglobin: 7 g/dL — ABNORMAL LOW (ref 13.0–17.0)
MCH: 26.2 pg (ref 26.0–34.0)
MCH: 26.4 pg (ref 26.0–34.0)
MCHC: 30.9 g/dL (ref 30.0–36.0)
MCHC: 31.5 g/dL (ref 30.0–36.0)
MCV: 84.8 fL (ref 80.0–100.0)
MCV: 83.8 fL (ref 80.0–100.0)
Platelets: 160 10*3/uL (ref 150–400)
Platelets: 151 10*3/uL (ref 150–400)
RBC: 2.56 MIL/uL — ABNORMAL LOW (ref 4.22–5.81)
RBC: 2.65 MIL/uL — ABNORMAL LOW (ref 4.22–5.81)
RDW: 19.5 % — ABNORMAL HIGH (ref 11.5–15.5)
RDW: 19.1 % — ABNORMAL HIGH (ref 11.5–15.5)
WBC: 6.8 10*3/uL (ref 4.0–10.5)
WBC: 6.6 10*3/uL (ref 4.0–10.5)
nRBC: 7.1 % — ABNORMAL HIGH (ref 0.0–0.2)
nRBC: 6.3 % — ABNORMAL HIGH (ref 0.0–0.2)

## 2023-01-24 LAB — BASIC METABOLIC PANEL
Anion gap: 12 (ref 5–15)
BUN: 18 mg/dL (ref 6–20)
CO2: 23 mmol/L (ref 22–32)
Calcium: 7.9 mg/dL — ABNORMAL LOW (ref 8.9–10.3)
Chloride: 94 mmol/L — ABNORMAL LOW (ref 98–111)
Creatinine, Ser: 1.5 mg/dL — ABNORMAL HIGH (ref 0.61–1.24)
GFR, Estimated: >60 mL/min (ref >60)
Glucose, Bld: 106 mg/dL — ABNORMAL HIGH (ref 70–99)
Potassium: 3.5 mmol/L (ref 3.5–5.1)
Sodium: 129 mmol/L — ABNORMAL LOW (ref 135–145)

## 2023-01-24 LAB — PROTIME-INR
INR: 2.3 — ABNORMAL HIGH (ref 0.8–1.2)
Prothrombin Time: 25.2 seconds — ABNORMAL HIGH (ref 11.4–15.2)

## 2023-01-24 LAB — BPAM RBC
Blood Product Expiration Date: 202406012359
ISSUE DATE / TIME: 202404251217
ISSUE DATE / TIME: 202404251706
ISSUE DATE / TIME: 202404260516

## 2023-01-24 LAB — PREPARE FRESH FROZEN PLASMA

## 2023-01-24 LAB — BPAM FFP
Blood Product Expiration Date: 202404282359
Blood Product Expiration Date: 202405012359
ISSUE DATE / TIME: 202404252350
Unit Type and Rh: 5100

## 2023-01-24 LAB — URINALYSIS, W/ REFLEX TO CULTURE (INFECTION SUSPECTED)
Glucose, UA: NEGATIVE mg/dL
Ketones, ur: NEGATIVE mg/dL
Leukocytes,Ua: NEGATIVE
Nitrite: NEGATIVE
Protein, ur: 30 mg/dL — AB
Specific Gravity, Urine: 1.028 (ref 1.005–1.030)
pH: 5 (ref 5.0–8.0)

## 2023-01-24 LAB — ECHOCARDIOGRAM COMPLETE
Area-P 1/2: 3.42 cm2
Calc EF: 46.5 %
Height: 69 in
S' Lateral: 4.5 cm
Single Plane A2C EF: 50.2 %
Single Plane A4C EF: 44 %
Weight: 3520 oz

## 2023-01-24 LAB — CULTURE, RESPIRATORY W GRAM STAIN

## 2023-01-24 LAB — PROCALCITONIN: Procalcitonin: 0.29 ng/mL

## 2023-01-24 LAB — PREPARE RBC (CROSSMATCH)

## 2023-01-24 LAB — MAGNESIUM: Magnesium: 1.8 mg/dL (ref 1.7–2.4)

## 2023-01-24 LAB — PHOSPHORUS: Phosphorus: 4.5 mg/dL (ref 2.5–4.6)

## 2023-01-24 MED ORDER — MAGNESIUM SULFATE 2 GM/50ML IV SOLN
2.0000 g | Freq: Once | INTRAVENOUS | Status: AC
  Administered 2023-01-24: 2 g via INTRAVENOUS
  Filled 2023-01-24: qty 50

## 2023-01-24 MED ORDER — FENTANYL BOLUS VIA INFUSION
50.0000 ug | INTRAVENOUS | Status: DC | PRN
  Administered 2023-01-25 – 2023-01-27 (×6): 100 ug via INTRAVENOUS
  Administered 2023-01-27: 75 ug via INTRAVENOUS
  Administered 2023-01-27 – 2023-02-01 (×26): 100 ug via INTRAVENOUS
  Administered 2023-02-01: 50 ug via INTRAVENOUS
  Administered 2023-02-01 – 2023-02-04 (×11): 100 ug via INTRAVENOUS

## 2023-01-24 MED ORDER — POTASSIUM CHLORIDE 10 MEQ/100ML IV SOLN
10.0000 meq | INTRAVENOUS | Status: AC
  Administered 2023-01-24 (×3): 10 meq via INTRAVENOUS
  Filled 2023-01-24 (×3): qty 100

## 2023-01-24 MED ORDER — MIDAZOLAM HCL 2 MG/2ML IJ SOLN
INTRAMUSCULAR | Status: AC
  Administered 2023-01-24: 2 mg via INTRAVENOUS
  Filled 2023-01-24: qty 2

## 2023-01-24 MED ORDER — FUROSEMIDE 10 MG/ML IJ SOLN
80.0000 mg | Freq: Once | INTRAMUSCULAR | Status: AC
  Administered 2023-01-24: 80 mg via INTRAVENOUS
  Filled 2023-01-24: qty 8

## 2023-01-24 MED ORDER — PHENYLEPHRINE 80 MCG/ML (10ML) SYRINGE FOR IV PUSH (FOR BLOOD PRESSURE SUPPORT)
PREFILLED_SYRINGE | INTRAVENOUS | Status: AC
  Filled 2023-01-24: qty 10

## 2023-01-24 MED ORDER — FUROSEMIDE 10 MG/ML IJ SOLN
120.0000 mg | Freq: Once | INTRAVENOUS | Status: AC
  Administered 2023-01-24: 120 mg via INTRAVENOUS
  Filled 2023-01-24: qty 10

## 2023-01-24 MED ORDER — LORAZEPAM 2 MG/ML IJ SOLN
1.0000 mg | INTRAMUSCULAR | Status: DC | PRN
  Administered 2023-01-24 (×2): 2 mg via INTRAVENOUS
  Filled 2023-01-24 (×2): qty 1

## 2023-01-24 MED ORDER — SUCCINYLCHOLINE CHLORIDE 200 MG/10ML IV SOSY: 100.0000 mg | PREFILLED_SYRINGE | Freq: Once | INTRAVENOUS | Status: AC

## 2023-01-24 MED ORDER — FENTANYL CITRATE PF 50 MCG/ML IJ SOSY: 50.0000 ug | PREFILLED_SYRINGE | INTRAMUSCULAR | Status: DC | PRN

## 2023-01-24 MED ORDER — LORAZEPAM 2 MG/ML IJ SOLN: 1.0000 mg | INTRAMUSCULAR | Status: DC | PRN

## 2023-01-24 MED ORDER — FENTANYL 2500MCG IN NS 250ML (10MCG/ML) PREMIX INFUSION
50.0000 ug/h | INTRAVENOUS | Status: DC
  Administered 2023-01-24: 150 ug/h via INTRAVENOUS
  Administered 2023-01-26: 25 ug/h via INTRAVENOUS
  Administered 2023-01-27: 100 ug/h via INTRAVENOUS
  Administered 2023-01-28 – 2023-02-01 (×10): 200 ug/h via INTRAVENOUS
  Administered 2023-02-02: 150 ug/h via INTRAVENOUS
  Administered 2023-02-03: 175 ug/h via INTRAVENOUS
  Administered 2023-02-04: 50 ug/h via INTRAVENOUS
  Administered 2023-02-04: 125 ug/h via INTRAVENOUS
  Filled 2023-01-24 (×17): qty 250

## 2023-01-24 MED ORDER — FENTANYL 2500MCG IN NS 250ML (10MCG/ML) PREMIX INFUSION
0.0000 ug/h | INTRAVENOUS | Status: DC
  Administered 2023-01-24: 25 ug/h via INTRAVENOUS
  Filled 2023-01-24: qty 250

## 2023-01-24 MED ORDER — ROCURONIUM BROMIDE 10 MG/ML (PF) SYRINGE
PREFILLED_SYRINGE | INTRAVENOUS | Status: AC
  Filled 2023-01-24: qty 10

## 2023-01-24 MED ORDER — PROPOFOL 1000 MG/100ML IV EMUL
5.0000 ug/kg/min | INTRAVENOUS | Status: DC
  Administered 2023-01-24: 70 ug/kg/min via INTRAVENOUS
  Administered 2023-01-24: 50 ug/kg/min via INTRAVENOUS
  Administered 2023-01-24: 20 ug/kg/min via INTRAVENOUS
  Administered 2023-01-24: 40 ug/kg/min via INTRAVENOUS
  Administered 2023-01-24: 60 ug/kg/min via INTRAVENOUS
  Administered 2023-01-24: 70 ug/kg/min via INTRAVENOUS
  Administered 2023-01-25: 30 ug/kg/min via INTRAVENOUS
  Administered 2023-01-25: 40 ug/kg/min via INTRAVENOUS
  Filled 2023-01-24 (×9): qty 100

## 2023-01-24 MED ORDER — FENTANYL CITRATE PF 50 MCG/ML IJ SOSY: 50.0000 ug | PREFILLED_SYRINGE | Freq: Once | INTRAMUSCULAR | Status: DC

## 2023-01-24 MED ORDER — SUCCINYLCHOLINE CHLORIDE 200 MG/10ML IV SOSY
PREFILLED_SYRINGE | INTRAVENOUS | Status: AC
  Administered 2023-01-24: 100 mg via INTRAVENOUS
  Filled 2023-01-24: qty 10

## 2023-01-24 MED ORDER — FENTANYL CITRATE PF 50 MCG/ML IJ SOSY
PREFILLED_SYRINGE | INTRAMUSCULAR | Status: AC
  Filled 2023-01-24: qty 2

## 2023-01-24 MED ORDER — ETOMIDATE 2 MG/ML IV SOLN: 20.0000 mg | Freq: Once | INTRAVENOUS | Status: AC

## 2023-01-24 MED ORDER — LORAZEPAM 1 MG PO TABS: 1.0000 mg | ORAL_TABLET | ORAL | Status: DC | PRN

## 2023-01-24 MED ORDER — MIDAZOLAM HCL 2 MG/2ML IJ SOLN: 4.0000 mg | Freq: Once | INTRAMUSCULAR | Status: AC

## 2023-01-24 MED ORDER — POLYETHYLENE GLYCOL 3350 17 G PO PACK
17.0000 g | PACK | Freq: Every day | ORAL | Status: DC
  Administered 2023-01-24 – 2023-02-03 (×10): 17 g
  Filled 2023-01-24 (×11): qty 1

## 2023-01-24 MED ORDER — ORAL CARE MOUTH RINSE: 15.0000 mL | OROMUCOSAL | Status: DC | PRN

## 2023-01-24 MED ORDER — VITAMIN K1 10 MG/ML IJ SOLN
10.0000 mg | Freq: Once | INTRAVENOUS | Status: AC
  Administered 2023-01-24: 10 mg via INTRAVENOUS
  Filled 2023-01-24: qty 1

## 2023-01-24 MED ORDER — FUROSEMIDE 10 MG/ML IJ SOLN
40.0000 mg | Freq: Once | INTRAMUSCULAR | Status: AC
  Administered 2023-01-24: 40 mg via INTRAVENOUS
  Filled 2023-01-24: qty 4

## 2023-01-24 MED ORDER — ORAL CARE MOUTH RINSE
15.0000 mL | OROMUCOSAL | Status: DC
  Administered 2023-01-24 – 2023-01-28 (×43): 15 mL via OROMUCOSAL

## 2023-01-24 MED ORDER — ETOMIDATE 2 MG/ML IV SOLN
INTRAVENOUS | Status: AC
  Administered 2023-01-24: 20 mg via INTRAVENOUS
  Filled 2023-01-24: qty 20

## 2023-01-24 MED ORDER — FUROSEMIDE 10 MG/ML IJ SOLN: 40.0000 mg | Freq: Once | INTRAMUSCULAR | Status: DC

## 2023-01-24 MED ORDER — DOCUSATE SODIUM 50 MG/5ML PO LIQD
100.0000 mg | Freq: Two times a day (BID) | ORAL | Status: DC
  Administered 2023-01-24 – 2023-02-03 (×20): 100 mg
  Filled 2023-01-24 (×20): qty 10

## 2023-01-24 MED ORDER — LACTULOSE 10 GM/15ML PO SOLN
20.0000 g | Freq: Two times a day (BID) | ORAL | Status: DC
  Administered 2023-01-24 – 2023-01-26 (×6): 20 g via ORAL
  Filled 2023-01-24 (×6): qty 30

## 2023-01-24 MED ORDER — FENTANYL CITRATE (PF) 100 MCG/2ML IJ SOLN
100.0000 ug | Freq: Once | INTRAMUSCULAR | Status: AC
  Administered 2023-01-24: 100 ug via INTRAVENOUS

## 2023-01-24 MED ORDER — POTASSIUM CHLORIDE 10 MEQ/100ML IV SOLN
10.0000 meq | INTRAVENOUS | Status: AC
  Administered 2023-01-24 (×4): 10 meq via INTRAVENOUS
  Filled 2023-01-24 (×4): qty 100

## 2023-01-24 NOTE — Progress Notes (Signed)
June RN at [facility] on camera at my request that patient is having increased shortness of breath, unable to verbally communicate needs. Only 100 mL's of amber colored urine with sediment collected in the urinal after 40 mg of IV lasix. Bed pad under patient is soiled with amber liquid which appears to be urine. This nurse wiped the patient's penis with a washcloth to remove blood seen. Coarse crackles audible throughout lungs. Another bedside RN to assist this nurse, Bladder scan showing zero. Elink MD on camera, will notify ground team and in route, order for BiPAP and AM labs obtained. Patient placed on BiPAP. Respirations still remain in the 50's

## 2023-01-24 NOTE — Progress Notes (Signed)
RT NOTE:  Pt ETT pulled back from 26 @ the lip to 24 @ the lip per CCMD order.

## 2023-01-24 NOTE — Progress Notes (Signed)
Elink MD adamant about restarting unit of blood, he said "he needed it and would help his problem". Blood transfusion restarted and going at a rate of 85 ml/hr to infuse over 4 hours.

## 2023-01-24 NOTE — Procedures (Signed)
Intubation Procedure Note  [name]  [id number]  1986/03/15  Date:01/24/23  Time:7:19 AM   Provider Performing:Gurtaj Ruz Marcos Eke    Procedure: Intubation (31500)  Indication(s) Respiratory Failure  Consent Risks of the procedure as well as the alternatives and risks of each were explained to the patient and/or caregiver.  Consent for the procedure was obtained and is signed in the bedside chart  Discussed with patient (sleepy but able to discuss, and consent from wife over phone).   Anesthesia Etomidate and Succinylcholine   Time Out Verified patient identification, verified procedure, site/side was marked, verified correct patient position, special equipment/implants available, medications/allergies/relevant history reviewed, required imaging and test results available.   Sterile Technique Usual hand hygeine, masks, and gloves were used   Procedure Description Patient positioned in bed supine.  Sedation given as noted above.  Patient was intubated with endotracheal tube using Glidescope.  View was Grade 1 full glottis .  Number of attempts was 1.  Colorimetric CO2 detector was consistent with tracheal placement.   Complications/Tolerance None; patient tolerated the procedure well. Chest X-ray is ordered to verify placement.   EBL none   Specimen(s) None

## 2023-01-24 NOTE — Consult Note (Addendum)
Urology Consult  Referring physician: Dr. Judeth Horn Reason for referral: Hematuria  Chief Complaint: Hematuria  History of Present Illness: [name] is a 37 year old male presenting to [facility] emergency department with complaints of rapid heart rate x 1 week.  In the ER his heart rate was noted at 175/BPM with a severe anemia, hemoglobin of 3.2-5.1 on repeat.  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  Patient has not had any difficulty urinating though nursing witnessed around 300 cc of output last night which was reported to be primarily blood and clot material.  [facility] was consulted to investigate a urinary source of his undifferentiated acute blood loss anemia.  Past Medical History:  Diagnosis Date   Liver cirrhosis, alcoholic (HCC)    Portal hypertension (HCC)    Splenomegaly, congestive, chronic    Past Surgical History:  Procedure Laterality Date   COSMETIC SURGERY     ESOPHAGOGASTRODUODENOSCOPY N/A 04/26/2021   Procedure: ESOPHAGOGASTRODUODENOSCOPY (EGD);  Surgeon: Willis Modena, MD;  Location: [facility];  Service: Endoscopy;  Laterality: N/A;   ESOPHAGOGASTRODUODENOSCOPY (EGD) WITH PROPOFOL N/A 11/01/2022   Procedure: ESOPHAGOGASTRODUODENOSCOPY (EGD) WITH PROPOFOL;  Surgeon: Vida Rigger, MD;  Location: [facility];  Service: Gastroenterology;  Laterality: N/A;   MANDIBLE FRACTURE SURGERY      Allergies: No Known Allergies  History reviewed. No pertinent family history. Social History:  reports that he has never smoked. He has never used smokeless tobacco. He reports current alcohol use. He reports that he does not use drugs.  ROS: Unable to assess.  Patient is intubated  Physical Exam:  Vital signs in last 24 hours: Temp:  [98 F (36.7 C)-99.6 F (37.6 C)] 98.1 F (36.7 C) (04/26 0500) Pulse Rate:  [55-175] 114 (04/26 0600) Resp:  [20-58] 57 (04/26 0600) BP: (103-163)/(51-98)  145/62 (04/26 0600) SpO2:  [90 %-100 %] 96 % (04/26 0711) FiO2 (%):  [40 %-100 %] 100 % (04/26 0711) Weight:  [99.8 kg] 99.8 kg (04/25 0850)  Respiratory: Intubated, ventilator dependent Genitourinary: Foley catheter in place draining clear orange urine  Laboratory Data:  Results for orders placed or performed during the hospital encounter of 01/23/23 (from the past 72 hour(s))  Resp panel by RT-PCR (RSV, Flu A&B, Covid) Anterior Nasal Swab     Status: None   Collection Time: 01/23/23  9:02 AM   Specimen: Anterior Nasal Swab  Result Value Ref Range   SARS Coronavirus 2 by RT PCR NEGATIVE NEGATIVE    Comment: (NOTE) SARS-CoV-2 target nucleic acids are NOT DETECTED.  The SARS-CoV-2 RNA is generally detectable in upper respiratory specimens during the acute phase of infection. The lowest concentration of SARS-CoV-2 viral copies this assay can detect is 138 copies/mL. A negative result does not preclude SARS-Cov-2 infection and should not be used as the sole basis for treatment or other patient management decisions. A negative result may occur with  improper specimen collection/handling, submission of specimen other than nasopharyngeal swab, presence of viral mutation(s) within the areas targeted by this assay, and inadequate number of viral copies(<138 copies/mL). A negative result must be combined with clinical observations, patient history, and epidemiological information. The expected result is Negative.  Fact Sheet for Patients:  BloggerCourse.com  Fact Sheet for Healthcare Providers:  SeriousBroker.it  This test is no t yet approved or cleared by the [place] FDA and  has been authorized for detection and/or diagnosis of SARS-CoV-2 by FDA under an Emergency Use Authorization (EUA). This  EUA will remain  in effect (meaning this test can be used) for the duration of the COVID-19 declaration under Section 564(b)(1) of the  Act, 21 U.S.C.section 360bbb-3(b)(1), unless the authorization is terminated  or revoked sooner.       Influenza A by PCR NEGATIVE NEGATIVE   Influenza B by PCR NEGATIVE NEGATIVE    Comment: (NOTE) The Xpert Xpress SARS-CoV-2/FLU/RSV plus assay is intended as an aid in the diagnosis of influenza from Nasopharyngeal swab specimens and should not be used as a sole basis for treatment. Nasal washings and aspirates are unacceptable for Xpert Xpress SARS-CoV-2/FLU/RSV testing.  Fact Sheet for Patients: BloggerCourse.com  Fact Sheet for Healthcare Providers: SeriousBroker.it  This test is not yet approved or cleared by the [place] FDA and has been authorized for detection and/or diagnosis of SARS-CoV-2 by FDA under an Emergency Use Authorization (EUA). This EUA will remain in effect (meaning this test can be used) for the duration of the COVID-19 declaration under Section 564(b)(1) of the Act, 21 U.S.C. section 360bbb-3(b)(1), unless the authorization is terminated or revoked.     Resp Syncytial Virus by PCR NEGATIVE NEGATIVE    Comment: (NOTE) Fact Sheet for Patients: BloggerCourse.com  Fact Sheet for Healthcare Providers: SeriousBroker.it  This test is not yet approved or cleared by the [place] FDA and has been authorized for detection and/or diagnosis of SARS-CoV-2 by FDA under an Emergency Use Authorization (EUA). This EUA will remain in effect (meaning this test can be used) for the duration of the COVID-19 declaration under Section 564(b)(1) of the Act, 21 U.S.C. section 360bbb-3(b)(1), unless the authorization is terminated or revoked.  Performed at [facility], [address] [place], [place] [address]   Comprehensive metabolic panel     Status: Abnormal   Collection Time: 01/23/23  9:05 AM  Result Value Ref Range   Sodium 130 (L) 135  - 145 mmol/L   Potassium 3.2 (L) 3.5 - 5.1 mmol/L   Chloride 95 (L) 98 - 111 mmol/L   CO2 21 (L) 22 - 32 mmol/L   Glucose, Bld 115 (H) 70 - 99 mg/dL    Comment: Glucose reference range applies only to samples taken after fasting for at least 8 hours.   BUN 17 6 - 20 mg/dL   Creatinine, Ser 3.08 (H) 0.61 - 1.24 mg/dL   Calcium 7.7 (L) 8.9 - 10.3 mg/dL   Total Protein 9.1 (H) 6.5 - 8.1 g/dL   Albumin 2.3 (L) 3.5 - 5.0 g/dL   AST 62 (H) 15 - 41 U/L   ALT 18 0 - 44 U/L   Alkaline Phosphatase 79 38 - 126 U/L   Total Bilirubin 12.1 (H) 0.3 - 1.2 mg/dL   GFR, Estimated 56 (L) >60 mL/min    Comment: (NOTE) Calculated using the CKD-EPI Creatinine Equation (2021)    Anion gap 14 5 - 15    Comment: Performed at [facility], [address], [place], [place] [address]  Brain natriuretic peptide     Status: Abnormal   Collection Time: 01/23/23  9:05 AM  Result Value Ref Range   B Natriuretic Peptide 1,344.4 (H) 0.0 - 100.0 pg/mL    Comment: Performed at [facility], [address] [place], [place] [address]  CBC with Differential/Platelet     Status: Abnormal   Collection Time: 01/23/23  9:05 AM  Result Value Ref Range   WBC 6.1 4.0 - 10.5 K/uL   RBC 1.44 (  L) 4.22 - 5.81 MIL/uL   Hemoglobin 3.2 (LL) 13.0 - 17.0 g/dL    Comment: REPEATED TO VERIFY THIS CRITICAL RESULT HAS VERIFIED AND BEEN CALLED TO SINCLAIR,L. RN BY NICOLE MCCOY ON 04 25 2024 AT 0956, AND HAS BEEN READ BACK. CHECKED RESULT. RECOMMEND RECOLLECT TO CONFIRM RESULT.    HCT 11.3 (L) 39.0 - 52.0 %   MCV 78.5 (L) 80.0 - 100.0 fL   MCH 22.2 (L) 26.0 - 34.0 pg   MCHC 28.3 (L) 30.0 - 36.0 g/dL   RDW 29.5 (H) 62.1 - 30.8 %   Platelets 222 150 - 400 K/uL   nRBC 4.6 (H) 0.0 - 0.2 %   Neutrophils Relative % 65 %   Neutro Abs 4.0 1.7 - 7.7 K/uL   Lymphocytes Relative 12 %   Lymphs Abs 0.7 0.7 - 4.0 K/uL   Monocytes Relative 20 %   Monocytes Absolute 1.2 (H) 0.1 - 1.0 K/uL   Eosinophils  Relative 2 %   Eosinophils Absolute 0.1 0.0 - 0.5 K/uL   Basophils Relative 0 %   Basophils Absolute 0.0 0.0 - 0.1 K/uL   Immature Granulocytes 1 %   Abs Immature Granulocytes 0.06 0.00 - 0.07 K/uL   Schistocytes PRESENT    Tear Drop Cells PRESENT    Polychromasia PRESENT    Target Cells PRESENT     Comment: Performed at [facility], [address], [place], [place] [address]  Magnesium     Status: None   Collection Time: 01/23/23  9:05 AM  Result Value Ref Range   Magnesium 1.8 1.7 - 2.4 mg/dL    Comment: Performed at [facility], [address], [place], [place] [address]  Protime-INR     Status: Abnormal   Collection Time: 01/23/23  9:05 AM  Result Value Ref Range   Prothrombin Time 26.4 (H) 11.4 - 15.2 seconds   INR 2.5 (H) 0.8 - 1.2    Comment: (NOTE) INR goal varies based on device and disease states. Performed at [facility], [address], [place], [place] [address]   APTT     Status: Abnormal   Collection Time: 01/23/23  9:05 AM  Result Value Ref Range   aPTT 48 (H) 24 - 36 seconds    Comment:        IF BASELINE aPTT IS ELEVATED, SUGGEST PATIENT RISK ASSESSMENT BE USED TO DETERMINE APPROPRIATE ANTICOAGULANT THERAPY. Performed at [facility], [address] [place], [place] [address]   Ethanol     Status: Abnormal   Collection Time: 01/23/23  9:05 AM  Result Value Ref Range   Alcohol, Ethyl (B) 125 (H) <10 mg/dL    Comment: (NOTE) Lowest detectable limit for serum alcohol is 10 mg/dL.  For medical purposes only. Performed at [facility], [address], [place], [place] [address]   Sample to Blood Bank     Status: None   Collection Time: 01/23/23  9:05 AM  Result Value Ref Range   Blood Bank Specimen SAMPLE AVAILABLE FOR TESTING    Sample Expiration      01/26/2023,2359 Performed at [facility], [address] [place], [place]  [address]   Type and screen [facility]     Status: None (Preliminary result)   Collection Time: 01/23/23  9:05 AM  Result Value Ref Range   ABO/RH(D) O POS    Antibody Screen NEG    Sample  Expiration 01/26/2023,2359    Unit Number [id number]    Blood Component Type RED CELLS,LR    Unit division 00    Status of Unit ISSUED    Transfusion Status OK TO TRANSFUSE    Crossmatch Result Compatible    Unit Number [id number]    Blood Component Type RED CELLS,LR    Unit division 00    Status of Unit ISSUED    Transfusion Status OK TO TRANSFUSE    Crossmatch Result Compatible    Unit Number [id number]    Blood Component Type RED CELLS,LR    Unit division 00    Status of Unit ISSUED    Transfusion Status OK TO TRANSFUSE    Crossmatch Result Compatible    Unit Number [id number]    Blood Component Type RED CELLS,LR    Unit division 00    Status of Unit ISSUED    Transfusion Status OK TO TRANSFUSE    Crossmatch Result Compatible    Unit Number [id number]    Blood Component Type RED CELLS,LR    Unit division 00    Status of Unit ISSUED    Transfusion Status OK TO TRANSFUSE    Crossmatch Result Compatible    Unit Number [id number]    Blood Component Type RCLI PHER 1    Unit division 00    Status of Unit ISSUED    Transfusion Status OK TO TRANSFUSE    Crossmatch Result      Compatible Performed at [facility], [address] [place], [place] [address]   I-Stat Chem 8, ED     Status: Abnormal   Collection Time: 01/23/23  9:14 AM  Result Value Ref Range   Sodium 134 (L) 135 - 145 mmol/L   Potassium 3.2 (L) 3.5 - 5.1 mmol/L   Chloride 95 (L) 98 - 111 mmol/L   BUN 15 6 - 20 mg/dL   Creatinine, Ser 8.41 (H) 0.61 - 1.24 mg/dL   Glucose, Bld 660 (H) 70 - 99 mg/dL    Comment: Glucose reference range applies only to samples taken after fasting for at least 8 hours.   Calcium, Ion 0.99 (L) 1.15 - 1.40 mmol/L   TCO2 22 22 - 32 mmol/L    Hemoglobin 5.1 (LL) 13.0 - 17.0 g/dL   HCT 63.0 (L) 16.0 - 10.9 %   Comment NOTIFIED PHYSICIAN   Ammonia     Status: Abnormal   Collection Time: 01/23/23 10:19 AM  Result Value Ref Range   Ammonia 37 (H) 9 - 35 umol/L    Comment: Performed at [facility], [address] [place], [place] [address]  Prepare RBC (crossmatch)     Status: None   Collection Time: 01/23/23 10:30 AM  Result Value Ref Range   Order Confirmation      ORDER PROCESSED BY BLOOD BANK Performed at [facility] [address], [place], [place] [address]   MRSA Next Gen by PCR, Nasal     Status: None   Collection Time: 01/23/23 12:10 PM   Specimen: Nasal Mucosa; Nasal Swab  Result Value Ref Range   MRSA by PCR Next Gen NOT DETECTED NOT DETECTED    Comment: (NOTE) The GeneXpert MRSA Assay (FDA approved for NASAL specimens only), is one component of a comprehensive MRSA colonization surveillance program. It is not intended to diagnose MRSA infection nor to guide or monitor treatment for MRSA infections. Test performance is not FDA approved in patients less than  37 years old. Performed at [facility], [address] [place], [place] [address]   Glucose, capillary     Status: None   Collection Time: 01/23/23  1:31 PM  Result Value Ref Range   Glucose-Capillary 98 70 - 99 mg/dL    Comment: Glucose reference range applies only to samples taken after fasting for at least 8 hours.   Comment 1 Notify RN    Comment 2 Document in Chart   Prepare RBC (crossmatch)     Status: None   Collection Time: 01/23/23  2:14 PM  Result Value Ref Range   Order Confirmation      ORDER PROCESSED BY BLOOD BANK Performed at [facility], [address] [place], [place] [address]   Glucose, capillary     Status: Abnormal   Collection Time: 01/23/23  4:11 PM  Result Value Ref Range   Glucose-Capillary 106 (H) 70 - 99 mg/dL    Comment: Glucose  reference range applies only to samples taken after fasting for at least 8 hours.  Glucose, capillary     Status: Abnormal   Collection Time: 01/23/23  8:07 PM  Result Value Ref Range   Glucose-Capillary 113 (H) 70 - 99 mg/dL    Comment: Glucose reference range applies only to samples taken after fasting for at least 8 hours.  CBC     Status: Abnormal   Collection Time: 01/23/23  8:49 PM  Result Value Ref Range   WBC 7.3 4.0 - 10.5 K/uL   RBC 2.10 (L) 4.22 - 5.81 MIL/uL   Hemoglobin 5.3 (LL) 13.0 - 17.0 g/dL    Comment: CRITICAL VALUE NOTED.  VALUE IS CONSISTENT WITH PREVIOUSLY REPORTED AND CALLED VALUE. REPEATED TO VERIFY    HCT 17.2 (L) 39.0 - 52.0 %   MCV 81.9 80.0 - 100.0 fL   MCH 25.2 (L) 26.0 - 34.0 pg   MCHC 30.8 30.0 - 36.0 g/dL   RDW 74.2 (H) 59.5 - 63.8 %   Platelets 168 150 - 400 K/uL   nRBC 3.4 (H) 0.0 - 0.2 %    Comment: Performed at [facility], [address] [place], [place] [address]  Hemoglobin A1c     Status: None   Collection Time: 01/23/23  8:49 PM  Result Value Ref Range   Hgb A1c MFr Bld 4.8 4.8 - 5.6 %    Comment: (NOTE) Pre diabetes:          5.7%-6.4%  Diabetes:              >6.4%  Glycemic control for   <7.0% adults with diabetes    Mean Plasma Glucose 91.06 mg/dL    Comment: Performed at [facility], [address] [place], [place] [address]  Prepare RBC (crossmatch)     Status: None   Collection Time: 01/23/23 10:44 PM  Result Value Ref Range   Order Confirmation      ORDER PROCESSED BY BLOOD BANK Performed at [facility], [address], [place], [place] [address]   Prepare fresh frozen plasma     Status: None (Preliminary result)   Collection Time: 01/23/23 11:30 PM  Result Value Ref Range   Unit Number [id number]    Blood Component Type THW PLS APHR    Unit division A0    Status of Unit ISSUED    Transfusion Status OK TO TRANSFUSE    Unit Number [id number]    Blood Component  Type THW PLS APHR    Unit division B0    Status of Unit ALLOCATED    Transfusion Status OK TO TRANSFUSE    Unit Number [id number]    Blood Component Type THW PLS APHR    Unit division A0    Status of Unit ALLOCATED    Transfusion Status OK TO TRANSFUSE   Glucose, capillary     Status: Abnormal   Collection Time: 01/24/23 12:08 AM  Result Value Ref Range   Glucose-Capillary 105 (H) 70 - 99 mg/dL    Comment: Glucose reference range applies only to samples taken after fasting for at least 8 hours.  Blood gas, arterial     Status: Abnormal   Collection Time: 01/24/23  1:22 AM  Result Value Ref Range   pH, Arterial 7.43 7.35 - 7.45   pCO2 arterial 35 32 - 48 mmHg   pO2, Arterial 79 (L) 83 - 108 mmHg   Bicarbonate 23.1 20.0 - 28.0 mmol/L   Acid-base deficit 0.5 0.0 - 2.0 mmol/L   O2 Saturation 100 %   Patient temperature 37.5    Collection site LEFT RADIAL    Drawn by [id number]    Allens test (pass/fail) PASS PASS    Comment: Performed at [facility], [address] [place], [place] [address]  Glucose, capillary     Status: Abnormal   Collection Time: 01/24/23  4:34 AM  Result Value Ref Range   Glucose-Capillary 107 (H) 70 - 99 mg/dL    Comment: Glucose reference range applies only to samples taken after fasting for at least 8 hours.  Magnesium     Status: None   Collection Time: 01/24/23  5:49 AM  Result Value Ref Range   Magnesium 1.8 1.7 - 2.4 mg/dL    Comment: Performed at [facility], [address], [place], [place] [address]  Phosphorus     Status: None   Collection Time: 01/24/23  5:49 AM  Result Value Ref Range   Phosphorus 4.5 2.5 - 4.6 mg/dL    Comment: ICTERUS AT THIS LEVEL MAY AFFECT RESULT Performed at [facility], [address], [place], [place] [address]   Basic metabolic panel     Status: Abnormal   Collection Time: 01/24/23  5:49 AM  Result Value Ref Range   Sodium 129 (L) 135 - 145 mmol/L    Potassium 3.5 3.5 - 5.1 mmol/L   Chloride 94 (L) 98 - 111 mmol/L   CO2 23 22 - 32 mmol/L   Glucose, Bld 106 (H) 70 - 99 mg/dL    Comment: Glucose reference range applies only to samples taken after fasting for at least 8 hours.   BUN 18 6 - 20 mg/dL   Creatinine, Ser 4.69 (H) 0.61 - 1.24 mg/dL   Calcium 7.9 (L) 8.9 - 10.3 mg/dL   GFR, Estimated >62 >95 mL/min    Comment: (NOTE) Calculated using the CKD-EPI Creatinine Equation (2021)    Anion gap 12 5 - 15    Comment: Performed at [facility], [address] [place], [place] [address]   Recent Results (from the past 240 hour(s))  Resp panel by RT-PCR (RSV, Flu A&B, Covid) Anterior Nasal Swab     Status: None   Collection Time: 01/23/23  9:02 AM   Specimen: Anterior Nasal Swab  Result Value Ref Range Status   SARS Coronavirus 2 by RT PCR NEGATIVE NEGATIVE Final    Comment: (NOTE) SARS-CoV-2  target nucleic acids are NOT DETECTED.  The SARS-CoV-2 RNA is generally detectable in upper respiratory specimens during the acute phase of infection. The lowest concentration of SARS-CoV-2 viral copies this assay can detect is 138 copies/mL. A negative result does not preclude SARS-Cov-2 infection and should not be used as the sole basis for treatment or other patient management decisions. A negative result may occur with  improper specimen collection/handling, submission of specimen other than nasopharyngeal swab, presence of viral mutation(s) within the areas targeted by this assay, and inadequate number of viral copies(<138 copies/mL). A negative result must be combined with clinical observations, patient history, and epidemiological information. The expected result is Negative.  Fact Sheet for Patients:  BloggerCourse.com  Fact Sheet for Healthcare Providers:  SeriousBroker.it  This test is no t yet approved or cleared by the [place] FDA and  has been  authorized for detection and/or diagnosis of SARS-CoV-2 by FDA under an Emergency Use Authorization (EUA). This EUA will remain  in effect (meaning this test can be used) for the duration of the COVID-19 declaration under Section 564(b)(1) of the Act, 21 U.S.C.section 360bbb-3(b)(1), unless the authorization is terminated  or revoked sooner.       Influenza A by PCR NEGATIVE NEGATIVE Final   Influenza B by PCR NEGATIVE NEGATIVE Final    Comment: (NOTE) The Xpert Xpress SARS-CoV-2/FLU/RSV plus assay is intended as an aid in the diagnosis of influenza from Nasopharyngeal swab specimens and should not be used as a sole basis for treatment. Nasal washings and aspirates are unacceptable for Xpert Xpress SARS-CoV-2/FLU/RSV testing.  Fact Sheet for Patients: BloggerCourse.com  Fact Sheet for Healthcare Providers: SeriousBroker.it  This test is not yet approved or cleared by the [place] FDA and has been authorized for detection and/or diagnosis of SARS-CoV-2 by FDA under an Emergency Use Authorization (EUA). This EUA will remain in effect (meaning this test can be used) for the duration of the COVID-19 declaration under Section 564(b)(1) of the Act, 21 U.S.C. section 360bbb-3(b)(1), unless the authorization is terminated or revoked.     Resp Syncytial Virus by PCR NEGATIVE NEGATIVE Final    Comment: (NOTE) Fact Sheet for Patients: BloggerCourse.com  Fact Sheet for Healthcare Providers: SeriousBroker.it  This test is not yet approved or cleared by the [place] FDA and has been authorized for detection and/or diagnosis of SARS-CoV-2 by FDA under an Emergency Use Authorization (EUA). This EUA will remain in effect (meaning this test can be used) for the duration of the COVID-19 declaration under Section 564(b)(1) of the Act, 21 U.S.C. section 360bbb-3(b)(1), unless the  authorization is terminated or revoked.  Performed at [facility], [address] [place], [place] [address]   MRSA Next Gen by PCR, Nasal     Status: None   Collection Time: 01/23/23 12:10 PM   Specimen: Nasal Mucosa; Nasal Swab  Result Value Ref Range Status   MRSA by PCR Next Gen NOT DETECTED NOT DETECTED Final    Comment: (NOTE) The GeneXpert MRSA Assay (FDA approved for NASAL specimens only), is one component of a comprehensive MRSA colonization surveillance program. It is not intended to diagnose MRSA infection nor to guide or monitor treatment for MRSA infections. Test performance is not FDA approved in patients less than 39 years old. Performed at [facility], [address] [place], [place] [address]    Creatinine: Recent Labs    01/23/23 0905 01/23/23 0914 01/24/23 0549  CREATININE 1.60* 1.80* 1.50*  CT: See report/chart  On independent review there is no evidence of lesion on the kidneys, or bladder.  There is no clot material present in the bladder.  No vascular abnormalities were noted.    Impression/Assessment:  On my arrival patient was not experiencing any hematuria, only elevated levels of bilirubin were present in clear urine, an expected finding in a severe cirrhotic.  He is critically ill in the ICU and required intubation with rapid transfusion overnight.  Plan:  No urologic intervention indicated at this time.  If he does start to spontaneously bleed again, this is almost certainly attributable to his coagulopathy rather than a urological source that can be repaired surgically.  If he ultimately requires hematuria catheter and continuous bladder irrigation, I will be happy to provide that.  I would not recommend placing a large bore catheter any sooner than needed, as it would almost certainly exacerbate his bleeding in the short-term.  I will not need to follow him at this time.  Please feel free to contact  with any questions, concerns, or acute urologic changes.  Nursing has been provided with my pager number as well.  Scherrie Bateman [place],NP 01/24/2023, 7:41 AM  [phone]  I have seen and examined the patient and agree with the above assessment and plan.  Urine is orange via foley, likely from cirrhosis. No hematuria or clots present. RUS unremarkable. Would recommend followup outpatient after recovery with CT urogram and cystoscopy to rule out another cause. Please call with questions.   Matt R. Letina Luckett MD [facility]  Pager: [phone]

## 2023-01-24 NOTE — Progress Notes (Signed)
Initial Nutrition Assessment  DOCUMENTATION CODES:   Not applicable  INTERVENTION:  - Recommend starting tube feeds once medically appropriate (once OG tube xray verified in stomach):  *Patient receiving 1106kcals from propofol, would recommend below regimen while pt remains on high dose propofol: Vital 1.5 at 30 ml/h (720 ml per day) *Start at 79mL/hr and advance by 10mL Q8H Prosource TF20 60 ml TID Provides 1320 kcal (+1100kcal from propofol = 2426kcal), 109 gm protein, 550 ml free water daily  - Once off propofol, recommend: Vital 1.5 at 60 ml/h (1440 ml per day) *Start at 44mL/hr and advance by 10mL Q8H Prosource TF20 60 ml BID Provides 2320 kcal, 137 gm protein, 1100 ml free water daily  - Monitor magnesium, potassium, and phosphorus BID for at least 3 days, MD to replete as needed, as pt is at risk for refeeding syndrome.  - Free water flushes per CCM/MD.   - Monitor weight trends.    NUTRITION DIAGNOSIS:   Inadequate oral intake related to inability to eat as evidenced by NPO status.  GOAL:   Patient will meet greater than or equal to 90% of their needs  MONITOR:   Vent status, Labs, Weight trends  REASON FOR ASSESSMENT:   Ventilator    ASSESSMENT:   37 y.o. 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   Fiance at bedside reports she is unsure of a UBW but feels his current weight is higher than normal due to fluid.  Reports patient typically eats only 1 meal a day, usually in the evening or late at night. Appetite poor at baseline.   No plans to start tube feeds today. Recommendations as above once able to.    Medications reviewed and include: Folic acid, Thiamine Fentanyl Propofol @ 41.15mL/hr (provides 1106 kcal over 24 hours)   Labs reviewed:  Na 129 Creatinine 1.50   NUTRITION - FOCUSED PHYSICAL EXAM:  Flowsheet  Row Most Recent Value  Orbital Region Mild depletion  Upper Arm Region No depletion  Thoracic and Lumbar Region No depletion  Buccal Region Unable to assess  Temple Region No depletion  Clavicle Bone Region No depletion  Clavicle and Acromion Bone Region Mild depletion  Scapular Bone Region Unable to assess  Dorsal Hand No depletion  Patellar Region No depletion  Anterior Thigh Region No depletion  Posterior Calf Region No depletion  Edema (RD Assessment) Moderate  Hair Reviewed  [bald]  Eyes Unable to assess  Mouth Unable to assess  Skin Reviewed  Nails Reviewed       Diet Order:   Diet Order             Diet NPO time specified Except for: Sips with Meds  Diet effective now                   EDUCATION NEEDS:  Education needs have been addressed  Skin:  Skin Assessment: Reviewed RN Assessment  Last BM:  PTA  Height:  Ht Readings from Last 1 Encounters:  01/23/23 5\' 9"  (1.753 m)   Weight:  Wt Readings from Last 1 Encounters:  01/23/23 99.8 kg   Ideal Body Weight:  72.73 kg  BMI:  Body mass index is 32.49 kg/m.  Estimated Nutritional Needs:  Kcal:  2250-2500 kcals Protein:  110-145 grams Fluid:  >/= 2.2L    Shelle Iron RD, LDN  For contact information, refer to [facility].

## 2023-01-24 NOTE — Plan of Care (Signed)
  Problem: Education: Goal: Knowledge of General Education information will improve Description: Including pain rating scale, medication(s)/side effects and non-pharmacologic comfort measures Outcome: Progressing   Problem: Health Behavior/Discharge Planning: Goal: Ability to manage health-related needs will improve Outcome: Progressing   Problem: Clinical Measurements: Goal: Ability to maintain clinical measurements within normal limits will improve Outcome: Progressing Goal: Will remain free from infection Outcome: Progressing Goal: Diagnostic test results will improve Outcome: Progressing Goal: Respiratory complications will improve Outcome: Progressing Goal: Cardiovascular complication will be avoided Outcome: Progressing   Problem: Activity: Goal: Risk for activity intolerance will decrease Outcome: Progressing   Problem: Nutrition: Goal: Adequate nutrition will be maintained Outcome: Progressing   Problem: Coping: Goal: Level of anxiety will decrease Outcome: Progressing   Problem: Elimination: Goal: Will not experience complications related to bowel motility Outcome: Progressing Goal: Will not experience complications related to urinary retention Outcome: Progressing   Problem: Pain Managment: Goal: General experience of comfort will improve Outcome: Progressing   Problem: Safety: Goal: Ability to remain free from injury will improve Outcome: Progressing   Problem: Skin Integrity: Goal: Risk for impaired skin integrity will decrease Outcome: Progressing   Problem: Safety: Goal: Non-violent Restraint(s) Outcome: Progressing   Problem: Physical Regulation: Goal: Complications related to the disease process, condition or treatment will be avoided or minimized Outcome: Progressing   Problem: Safety: Goal: Ability to remain free from injury will improve Outcome: Progressing   Problem: Education: Goal: Ability to identify signs and symptoms of  gastrointestinal bleeding will improve Outcome: Progressing   Problem: Bowel/Gastric: Goal: Will show no signs and symptoms of gastrointestinal bleeding Outcome: Progressing   Problem: Fluid Volume: Goal: Will show no signs and symptoms of excessive bleeding Outcome: Progressing   Problem: Clinical Measurements: Goal: Complications related to the disease process, condition or treatment will be avoided or minimized Outcome: Progressing

## 2023-01-24 NOTE — Progress Notes (Signed)
See H&P from today for today's progress note    Simonne Martinet ACNP-BC [facility] Pulmonary/Critical Care Pager # [phone] OR # [phone] if no answer

## 2023-01-24 NOTE — Progress Notes (Signed)
This nurse went into patient's room and patient had his right leg off the bed, and blood on his right arm and gown. When assessed, patient had small bowel movement and bloody liquid on the sheets requiring a bed linen change. A lot of encouragement is needed for patient to assist with any repositioning and assisting with peri care. Flat affect noted.

## 2023-01-24 NOTE — Consult Note (Signed)
Chief Complaint: Patient was seen in consultation today for cirrhosis and severe anemia  Referring Physician(s): Kerin Salen   Patient Status: [facility] - In-pt  History of Present Illness: [name] is a 37 y.o. male with alcohol related cirrhosis and found to be severely anemic.  Patient was tachycardiac with SVT and cardioverted. Patient denied hematemesis, melena, hemoptysis and hematochezia.  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 During admission, patient developed hematuria and respiratory failure that required intubation.  Patient is currently intubated and receiving blood products.  Patient had a CTA bleeding study that demonstrates extensive portosystemic shunting and asked to evaluate for TIPS and/or BRTO.   Past Medical History:  Diagnosis Date   Liver cirrhosis, alcoholic (HCC)    Portal hypertension (HCC)    Splenomegaly, congestive, chronic     Past Surgical History:  Procedure Laterality Date   COSMETIC SURGERY     ESOPHAGOGASTRODUODENOSCOPY N/A 04/26/2021   Procedure: ESOPHAGOGASTRODUODENOSCOPY (EGD);  Surgeon: Willis Modena, MD;  Location: [facility];  Service: Endoscopy;  Laterality: N/A;   ESOPHAGOGASTRODUODENOSCOPY (EGD) WITH PROPOFOL N/A 11/01/2022   Procedure: ESOPHAGOGASTRODUODENOSCOPY (EGD) WITH PROPOFOL;  Surgeon: Vida Rigger, MD;  Location: [facility];  Service: Gastroenterology;  Laterality: N/A;   MANDIBLE FRACTURE SURGERY      Allergies: Patient has no known allergies.  Medications: Prior to Admission medications   Medication Sig Start Date End Date Taking? Authorizing Provider  furosemide (LASIX) 40 MG tablet Take 1 tablet (40 mg total) by mouth daily. 11/02/22  Yes Narda Bonds, MD  spironolactone (ALDACTONE) 50 MG tablet Take 1 tablet (50 mg total) by mouth daily. 11/02/22 01/31/23 Yes Narda Bonds, MD   ferrous sulfate 325 (65 FE) MG tablet Take 1 tablet (325 mg total) by mouth daily with breakfast. Patient not taking: Reported on 10/25/2022 07/30/22 10/25/22  Pollyann Savoy, MD  lactulose (CHRONULAC) 10 GM/15ML solution Take 45 mLs (30 g total) by mouth 3 (three) times daily. Patient not taking: Reported on 01/23/2023 11/02/22 01/31/23  Narda Bonds, MD  pantoprazole (PROTONIX) 40 MG tablet Take 1 tablet (40 mg total) by mouth 2 (two) times daily. Patient not taking: Reported on 01/23/2023 11/02/22 01/31/23  Narda Bonds, MD  propranolol (INDERAL) 10 MG tablet Take 1 tablet (10 mg total) by mouth 2 (two) times daily. Patient not taking: Reported on 01/23/2023 11/02/22 01/31/23  Narda Bonds, MD     History reviewed. No pertinent family history.  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  Tobacco Use   Smoking status: Never   Smokeless tobacco: Never  Vaping Use   Vaping Use: Never used  Substance and Sexual Activity   Alcohol use: Yes    Comment: pt reports one pint of vodka when he drinks   Drug use: No   Sexual activity: Not on file  Other Topics Concern   Not on file  Social History Narrative   Not on file   Social Determinants of Health   Financial Resource Strain: Not on file  Food Insecurity: No Food Insecurity (10/25/2022)   Hunger Vital Sign    Worried About Running Out of Food in  the Last Year: Never true    Ran Out of Food in the Last Year: Never true  Transportation Needs: No Transportation Needs (10/25/2022)   PRAPARE - Administrator, Civil Service (Medical): No    Lack of Transportation (Non-Medical): No  Physical Activity: Not on file  Stress: Not on file  Social Connections: Not on file     Review of Systems  Vital Signs: BP (!)  103/50   Pulse 95   Temp 100 F (37.8 C)   Resp (!) 21   Ht 5\' 9"  (1.753 m)   Wt 99.8 kg   SpO2 100%   BMI 32.49 kg/m     Physical Exam  Intubated patient  Imaging: ECHOCARDIOGRAM COMPLETE  Result Date: 01/24/2023    ECHOCARDIOGRAM REPORT   Patient Name:   [name] Date of Exam: 01/24/2023 Medical Rec #:  [id number]      Height:       69.0 in Accession #:    [id number]     Weight:       220.0 lb Date of Birth:  August 02, 1986       BSA:          2.151 m Patient Age:    37 years       BP:           145/62 mmHg Patient Gender: M              HR:           99 bpm. Exam Location:  Inpatient Procedure: 2D Echo, Color Doppler and Cardiac Doppler Indications:    CHF  History:        Patient has prior history of Echocardiogram examinations, most                 recent 01/12/2022. Risk Factors:Hypertension.  Sonographer:    Milbert Coulter Referring Phys: [phone] [facility]  Sonographer Comments: Echo performed with patient supine and on artificial respirator. IMPRESSIONS  1. Left ventricular ejection fraction, by estimation, is 50 to 55%. The left ventricle has low normal function. The left ventricle has no regional wall motion abnormalities. There is mild left ventricular hypertrophy. Left ventricular diastolic parameters are indeterminate.  2. Right ventricular systolic function is normal. The right ventricular size is moderately enlarged. There is mildly elevated pulmonary artery systolic pressure.  3. Right atrial size was mildly dilated.  4. No evidence of mitral valve regurgitation.  5. Tricuspid valve regurgitation is mild to moderate.  6. The aortic valve was not well visualized. Aortic valve regurgitation is not visualized.  7. Aortic not well visualized.  8. The inferior vena cava is dilated in size with <50% respiratory variability, suggesting right atrial pressure of 15 mmHg. FINDINGS  Left Ventricle: Left ventricular ejection fraction, by estimation, is 50 to 55%. The left ventricle has low  normal function. The left ventricle has no regional wall motion abnormalities. The left ventricular internal cavity size was normal in size. There is mild left ventricular hypertrophy. Left ventricular diastolic parameters are indeterminate. Right Ventricle: The right ventricular size is moderately enlarged. Right ventricular systolic function is normal. There is mildly elevated pulmonary artery systolic pressure. The tricuspid regurgitant velocity is 2.69 m/s, and with an assumed right atrial pressure of 15 mmHg, the estimated right ventricular systolic pressure is 43.9 mmHg. Left Atrium: Left atrial size was normal in size. Right Atrium: Right atrial size was mildly dilated. Pericardium: There is no evidence of pericardial effusion.  Mitral Valve: No evidence of mitral valve regurgitation. Tricuspid Valve: Tricuspid valve regurgitation is mild to moderate. Aortic Valve: The aortic valve was not well visualized. Aortic valve regurgitation is not visualized. Pulmonic Valve: Pulmonic valve regurgitation is not visualized. Aorta: Not well visualized. Venous: The inferior vena cava is dilated in size with less than 50% respiratory variability, suggesting right atrial pressure of 15 mmHg. IAS/Shunts: The interatrial septum was not well visualized.  LEFT VENTRICLE PLAX 2D LVIDd:         5.80 cm      Diastology LVIDs:         4.50 cm      LV e' medial:    9.79 cm/s LV PW:         1.10 cm      LV E/e' medial:  12.6 LV IVS:        1.10 cm      LV e' lateral:   11.70 cm/s LVOT diam:     2.10 cm      LV E/e' lateral: 10.5 LVOT Area:     3.46 cm  LV Volumes (MOD) LV vol d, MOD A2C: 123.0 ml LV vol d, MOD A4C: 134.0 ml LV vol s, MOD A2C: 61.3 ml LV vol s, MOD A4C: 75.1 ml LV SV MOD A2C:     61.7 ml LV SV MOD A4C:     134.0 ml LV SV MOD BP:      60.2 ml RIGHT VENTRICLE RV Basal diam:  6.60 cm RV Mid diam:    5.60 cm RV S prime:     12.20 cm/s TAPSE (M-mode): 2.5 cm LEFT ATRIUM             Index        RIGHT ATRIUM            Index LA diam:        4.10 cm 1.91 cm/m   RA Area:     25.30 cm LA Vol (A2C):   53.6 ml 24.91 ml/m  RA Volume:   91.40 ml  42.48 ml/m LA Vol (A4C):   54.3 ml 25.24 ml/m LA Biplane Vol: 56.5 ml 26.26 ml/m  MITRAL VALVE                TRICUSPID VALVE MV Area (PHT): 3.42 cm     TR Peak grad:   28.9 mmHg MV Decel Time: 222 msec     TR Vmax:        269.00 cm/s MV E velocity: 123.00 cm/s MV A velocity: 135.00 cm/s  SHUNTS MV E/A ratio:  0.91         Systemic Diam: 2.10 cm Carolan Clines Electronically signed by Carolan Clines Signature Date/Time: 01/24/2023/10:37:05 AM    Final    US RENAL  Result Date: 01/24/2023 CLINICAL DATA:  Hematuria EXAM: RENAL / URINARY TRACT ULTRASOUND COMPLETE COMPARISON:  CT 10/26/2022 FINDINGS: Right Kidney: Renal measurements: 12.1 x 6.5 x 6.8 cm = volume: 281 mL. Normal cortical thickness and echogenicity. Suboptimally visualized due to shadowing related to body habitus. No gross evidence of mass or hydronephrosis. Left Kidney: Renal measurements: 11.9 x 6.9 x 6.5 cm = volume: 281 mL. Normal cortical thickness and echogenicity. Suboptimally visualized due to shadowing from body habitus. No mass or hydronephrosis. Bladder: Decompressed by Foley catheter, inadequately assessed Other: No gross shadowing calculi are seen at either kidney though assessment is limited. IMPRESSION: No gross evidence of renal mass or hydronephrosis. Electronically Signed  By: Ulyses Southward M.D.   On: 01/24/2023 10:03   DG CHEST PORT 1 VIEW  Result Date: 01/24/2023 CLINICAL DATA:  Intubation EXAM: PORTABLE CHEST 1 VIEW COMPARISON:  Portable exam 0759 hours compared to 01/24/2023 at 0731 hours FINDINGS: Tip of endotracheal tube now projects 5.8 cm above carina. Nasogastric tube extends into stomach. Enlargement of cardiac silhouette with pulmonary vascular congestion. Perihilar infiltrates favoring pulmonary edema. Small bibasilar effusions. No pneumothorax. IMPRESSION: CHF with small bibasilar effusions. Tip  of endotracheal tube now projects 5.8 cm above carina. Electronically Signed   By: Ulyses Southward M.D.   On: 01/24/2023 08:24   DG CHEST PORT 1 VIEW  Result Date: 01/24/2023 CLINICAL DATA:  Status post intubation. EXAM: PORTABLE CHEST 1 VIEW COMPARISON:  Earlier today FINDINGS: The ET tube tip is approximately 1.5 cm above the carina directed towards the right mainstem bronchus. Cardiac enlargement. Bilateral pleural effusions and interstitial edema appears similar to the previous exam. Visualized osseous structures are unremarkable. IMPRESSION: The ET tube tip is approximately 1.5 cm above the carina directed towards the right mainstem bronchus. Recommend retracting approximately 1-2 cm. No change in CHF pattern. These results will be called to the ordering clinician or representative by the Radiologist Assistant, and communication documented in the PACS or [employer]. Electronically Signed   By: Signa Kell M.D.   On: 01/24/2023 07:43   DG CHEST PORT 1 VIEW  Result Date: 01/24/2023 CLINICAL DATA:  Respiratory distress EXAM: PORTABLE CHEST 1 VIEW COMPARISON:  01/23/2023 FINDINGS: Cardiomegaly with vascular congestion. Bilateral airspace opacities most compatible with moderate edema. Probable layering bilateral effusions. No acute bony abnormality. IMPRESSION: Moderate CHF Electronically Signed   By: Charlett Nose M.D.   On: 01/24/2023 01:40   CT ANGIO ABD/PELVIS BRTO  Result Date: 01/23/2023 CLINICAL DATA:  Gastric varices severe anemia, gastric and duodenal varices, may need embolization EXAM: CTA ABDOMEN AND PELVIS WITHOUT AND WITH CONTRAST TECHNIQUE: Multidetector CT imaging of the abdomen and pelvis was performed using the standard protocol during bolus administration of intravenous contrast. Multiplanar reconstructed images and MIPs were obtained and reviewed to evaluate the vascular anatomy. RADIATION DOSE REDUCTION: This exam was performed according to the departmental dose-optimization  program which includes automated exposure control, adjustment of the mA and/or kV according to patient size and/or use of iterative reconstruction technique. CONTRAST:  OMNIPAQUE IOHEXOL 350 MG/ML SOLN COMPARISON:  10/26/2022 FINDINGS: VASCULAR Aorta: Normal caliber aorta without aneurysm, dissection, vasculitis or significant stenosis. Celiac: Patent without evidence of aneurysm, dissection, vasculitis or significant stenosis. SMA: Patent without evidence of aneurysm, dissection, vasculitis or significant stenosis. Renals: Both renal arteries are patent without evidence of aneurysm, dissection, vasculitis, fibromuscular dysplasia or significant stenosis. IMA: Patent without evidence of aneurysm, dissection, vasculitis or significant stenosis. Inflow: Patent without evidence of aneurysm, dissection, vasculitis or significant stenosis. Proximal Outflow: Bilateral common femoral and visualized portions of the superficial and profunda femoral arteries are patent without evidence of aneurysm, dissection, vasculitis or significant stenosis. Veins: Patent hepatic veins, portal vein, SM V. There is a large venous collateral from the SMV to the left retroperitoneum ultimately draining into the left renal vein. There is a large tortuous left splenorenal shunt, with small gastric varices. Mild esophageal varices near the GE junction. Iliac venous system patent. 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 Review of the MIP images confirms the above findings. NON-VASCULAR Lower chest: Small pleural effusions right greater than left. Dependent atelectasis/consolidation in the lower lobes worse right than left.  Hepatobiliary: Short-term liver with nodular contour. No mass or biliary ductal dilatation. Gallbladder physiologically distended with multiple small partially calcified layering stones in its dependent aspect. Pancreas: Unremarkable. No pancreatic ductal dilatation or surrounding inflammatory changes.  Spleen: Borderline splenomegaly 12.4 cm maximum length. No focal lesion. Patent splenic vein with large left retroperitoneal splenorenal shunt. Adrenals/Urinary Tract: No adrenal mass. Symmetric renal parenchymal enhancement. No urolithiasis or hydronephrosis. Urinary bladder incompletely distended. Stomach/Bowel: Stomach is partially distended, unremarkable. Small bowel decompressed. The colon is incompletely distended by gas and fecal material, unremarkable. Lymphatic: No adenopathy. Reproductive: Prostate is unremarkable. Other: Left pelvic phleboliths.  No ascites.  No free air. Musculoskeletal: Body wall anasarca.  Regional bones unremarkable. IMPRESSION: 1. Cirrhotic liver with borderline splenomegaly, large portosystemic venous collaterals, small gastric and esophageal varices. 2. No ascites. 3. Cholelithiasis. 4. Small bilateral pleural effusions right greater than left. Electronically Signed   By: Corlis Leak M.D.   On: 01/23/2023 12:50   DG Chest Port 1 View  Result Date: 01/23/2023 CLINICAL DATA:  Shortness of breath and tachycardia. EXAM: PORTABLE CHEST 1 VIEW COMPARISON:  01/12/2022 FINDINGS: The heart is enlarged but stable. External pacer paddles are noted. There is moderate vascular congestion and probable mild interstitial edema. Small bilateral pleural effusions are suspected. No infiltrates or pneumothorax. IMPRESSION: Cardiac enlargement, vascular congestion and probable mild interstitial edema. Suspect small bilateral pleural effusions. Electronically Signed   By: Rudie Meyer M.D.   On: 01/23/2023 09:50    Labs:  CBC: Recent Labs    11/02/22 0716 01/23/23 0905 01/23/23 0914 01/23/23 2049 01/24/23 1118  WBC 5.1 6.1  --  7.3 6.8  HGB 7.7* 3.2* 5.1* 5.3* 6.7*  HCT 25.7* 11.3* 15.0* 17.2* 21.7*  PLT 182 222  --  168 160    COAGS: Recent Labs    07/27/22 1241 07/28/22 0435 10/25/22 1135 11/01/22 0325 11/02/22 0716 01/23/23 0905  INR 2.1*   < > 2.4* 2.6* 2.4* 2.5*   APTT 57*  --   --   --   --  48*   < > = values in this interval not displayed.    BMP: Recent Labs    11/01/22 0325 11/02/22 0716 01/23/23 0905 01/23/23 0914 01/24/23 0549  NA 135 132* 130* 134* 129*  K 3.6 3.9 3.2* 3.2* 3.5  CL 108 104 95* 95* 94*  CO2 21* 21* 21*  --  23  GLUCOSE 91 92 115* 114* 106*  BUN 6 6 17 15 18   CALCIUM 8.1* 8.3* 7.7*  --  7.9*  CREATININE 0.50* 0.52* 1.60* 1.80* 1.50*  GFRNONAA >60 >60 56*  --  >60    LIVER FUNCTION TESTS: Recent Labs    10/29/22 1055 10/30/22 1554 11/01/22 0325 11/02/22 0716 01/23/23 0905  BILITOT 11.4* 12.1* 10.7* 11.5* 12.1*  AST 78*  --  72* 77* 62*  ALT 20  --  23 23 18   ALKPHOS 69  --  78 61 79  PROT 7.8  --  7.3 7.7 9.1*  ALBUMIN 2.1*  --  1.8* 1.9* 2.3*    TUMOR MARKERS: No results for input(s): "AFPTM", "CEA", "CA199", "CHROMGRNA" in the last 8760 hours.  Assessment and Plan:  37 yo male with respiratory failure, cirrhosis and severe anemia.  Patient has had hematuria but no other obvious bleeding source.  Patient is at risk for GI bleeding based on his cirrhosis and known varices.  CTA exam demonstrates extensive portosystemic shunting associated with spleen and mesenteric veins.  Left renal  vein and IVC are enlarged.  Small varices around the duodenum and probably proximal jejunum.  MELD is currently 7 and patient is a poor candidate for TIPS.  Anatomy is unusual and will be difficult to treat variceal bleeding (if patient has any).  There are many potential sites of GI bleeding and we would need to localize a source prior to attempting any type of embolization procedure.  The hematuria could potentially be associated with left renal venous congestion (similar to nutcracker syndrome) but would need to localize bleeding to left kidney prior to any treatment.  No plans for intervention at this time but IR will follow.   Electronically Signed: Arn Medal, MD 01/24/2023, 2:47 PM

## 2023-01-24 NOTE — Progress Notes (Signed)
Getting blood. Hgb still < 7 but no active bleeding noted since earlier this am.  Seen by IR who do not think BRTO or TIPPS approp d/t hihg MELD and complicated vasculature.  Plan Will cont supportive care Spoke to GI, they will consider EGD tomorrow.  His urine output has been low. If not much in bladder I will increase his lasix dose  [name] ACNP-BC [facility] Pulmonary/Critical Care Pager # [phone] OR # [phone] if no answer

## 2023-01-24 NOTE — Progress Notes (Signed)
eLink Physician-Brief Progress Note Patient Name: Koda Routon DOB: 07/28/1986 MRN: [id number]   Date of Service  01/24/2023  HPI/Events of Note  Worsening tachypnea.  Maintaining saturation on [place].  Significant cardiomegaly on cxr earlier and has received large volume fluids in IV in form of blood products.  eICU Interventions  Lasix IV x 1        SMITHFayrene Fearing, P 01/24/2023, 1:21 AM

## 2023-01-24 NOTE — Progress Notes (Signed)
Subjective: Patient was intubated today morning. He is currently sedated with propofol and fentanyl. No bowel movements noted as per his nurse(no melena, hematochezia, hematemesis),NG has been placed but not to suction.  Objective: Vital signs in last 24 hours: Temp:  [98 F (36.7 C)-99.6 F (37.6 C)] 99.3 F (37.4 C) (04/26 0907) Pulse Rate:  [55-120] 100 (04/26 0907) Resp:  [22-58] 24 (04/26 0907) BP: (103-163)/(51-98) 123/66 (04/26 0907) SpO2:  [90 %-100 %] 100 % (04/26 0907) FiO2 (%):  [40 %-100 %] 100 % (04/26 0907) Weight change:  Last BM Date :  (PTA)  PE:Intubated and sedated GENERAL:Deeply icteric and pale  ABDOMEN:Obese, distended abdomen, sluggish bowel sounds EXTREMITIES:Chronic skin changes in B/L lower extremities with bipedal edema  Lab Results: Results for orders placed or performed during the hospital encounter of 01/23/23 (from the past 48 hour(s))  Resp panel by RT-PCR (RSV, Flu A&B, Covid) Anterior Nasal Swab     Status: None   Collection Time: 01/23/23  9:02 AM   Specimen: Anterior Nasal Swab  Result Value Ref Range   SARS Coronavirus 2 by RT PCR NEGATIVE NEGATIVE    Comment: (NOTE) SARS-CoV-2 target nucleic acids are NOT DETECTED.  The SARS-CoV-2 RNA is generally detectable in upper respiratory specimens during the acute phase of infection. The lowest concentration of SARS-CoV-2 viral copies this assay can detect is 138 copies/mL. A negative result does not preclude SARS-Cov-2 infection and should not be used as the sole basis for treatment or other patient management decisions. A negative result may occur with  improper specimen collection/handling, submission of specimen other than nasopharyngeal swab, presence of viral mutation(s) within the areas targeted by this assay, and inadequate number of viral copies(<138 copies/mL). A negative result must be combined with clinical observations, patient history, and epidemiological information. The  expected result is Negative.  Fact Sheet for Patients:  BloggerCourse.com  Fact Sheet for Healthcare Providers:  SeriousBroker.it  This test is no t yet approved or cleared by the [place] FDA and  has been authorized for detection and/or diagnosis of SARS-CoV-2 by FDA under an Emergency Use Authorization (EUA). This EUA will remain  in effect (meaning this test can be used) for the duration of the COVID-19 declaration under Section 564(b)(1) of the Act, 21 U.S.C.section 360bbb-3(b)(1), unless the authorization is terminated  or revoked sooner.       Influenza A by PCR NEGATIVE NEGATIVE   Influenza B by PCR NEGATIVE NEGATIVE    Comment: (NOTE) The Xpert Xpress SARS-CoV-2/FLU/RSV plus assay is intended as an aid in the diagnosis of influenza from Nasopharyngeal swab specimens and should not be used as a sole basis for treatment. Nasal washings and aspirates are unacceptable for Xpert Xpress SARS-CoV-2/FLU/RSV testing.  Fact Sheet for Patients: BloggerCourse.com  Fact Sheet for Healthcare Providers: SeriousBroker.it  This test is not yet approved or cleared by the [place] FDA and has been authorized for detection and/or diagnosis of SARS-CoV-2 by FDA under an Emergency Use Authorization (EUA). This EUA will remain in effect (meaning this test can be used) for the duration of the COVID-19 declaration under Section 564(b)(1) of the Act, 21 U.S.C. section 360bbb-3(b)(1), unless the authorization is terminated or revoked.     Resp Syncytial Virus by PCR NEGATIVE NEGATIVE    Comment: (NOTE) Fact Sheet for Patients: BloggerCourse.com  Fact Sheet for Healthcare Providers: SeriousBroker.it  This test is not yet approved or cleared by the [place] FDA and has been authorized for detection and/or diagnosis  of  SARS-CoV-2 by FDA under an Emergency Use Authorization (EUA). This EUA will remain in effect (meaning this test can be used) for the duration of the COVID-19 declaration under Section 564(b)(1) of the Act, 21 U.S.C. section 360bbb-3(b)(1), unless the authorization is terminated or revoked.  Performed at [facility], [address], [place], [place] [address]   Comprehensive metabolic panel     Status: Abnormal   Collection Time: 01/23/23  9:05 AM  Result Value Ref Range   Sodium 130 (L) 135 - 145 mmol/L   Potassium 3.2 (L) 3.5 - 5.1 mmol/L   Chloride 95 (L) 98 - 111 mmol/L   CO2 21 (L) 22 - 32 mmol/L   Glucose, Bld 115 (H) 70 - 99 mg/dL    Comment: Glucose reference range applies only to samples taken after fasting for at least 8 hours.   BUN 17 6 - 20 mg/dL   Creatinine, Ser 6.04 (H) 0.61 - 1.24 mg/dL   Calcium 7.7 (L) 8.9 - 10.3 mg/dL   Total Protein 9.1 (H) 6.5 - 8.1 g/dL   Albumin 2.3 (L) 3.5 - 5.0 g/dL   AST 62 (H) 15 - 41 U/L   ALT 18 0 - 44 U/L   Alkaline Phosphatase 79 38 - 126 U/L   Total Bilirubin 12.1 (H) 0.3 - 1.2 mg/dL   GFR, Estimated 56 (L) >60 mL/min    Comment: (NOTE) Calculated using the CKD-EPI Creatinine Equation (2021)    Anion gap 14 5 - 15    Comment: Performed at [facility], [address] [place], [place] [address]  Brain natriuretic peptide     Status: Abnormal   Collection Time: 01/23/23  9:05 AM  Result Value Ref Range   B Natriuretic Peptide 1,344.4 (H) 0.0 - 100.0 pg/mL    Comment: Performed at [facility], [address], [place], [place] [address]  CBC with Differential/Platelet     Status: Abnormal   Collection Time: 01/23/23  9:05 AM  Result Value Ref Range   WBC 6.1 4.0 - 10.5 K/uL   RBC 1.44 (L) 4.22 - 5.81 MIL/uL   Hemoglobin 3.2 (LL) 13.0 - 17.0 g/dL    Comment: REPEATED TO VERIFY THIS CRITICAL RESULT HAS VERIFIED AND BEEN CALLED TO SINCLAIR,L. RN BY NICOLE MCCOY ON 04 25  2024 AT 0956, AND HAS BEEN READ BACK. CHECKED RESULT. RECOMMEND RECOLLECT TO CONFIRM RESULT.    HCT 11.3 (L) 39.0 - 52.0 %   MCV 78.5 (L) 80.0 - 100.0 fL   MCH 22.2 (L) 26.0 - 34.0 pg   MCHC 28.3 (L) 30.0 - 36.0 g/dL   RDW 78.2 (H) 95.6 - 21.3 %   Platelets 222 150 - 400 K/uL   nRBC 4.6 (H) 0.0 - 0.2 %   Neutrophils Relative % 65 %   Neutro Abs 4.0 1.7 - 7.7 K/uL   Lymphocytes Relative 12 %   Lymphs Abs 0.7 0.7 - 4.0 K/uL   Monocytes Relative 20 %   Monocytes Absolute 1.2 (H) 0.1 - 1.0 K/uL   Eosinophils Relative 2 %   Eosinophils Absolute 0.1 0.0 - 0.5 K/uL   Basophils Relative 0 %   Basophils Absolute 0.0 0.0 - 0.1 K/uL   Immature Granulocytes 1 %   Abs Immature Granulocytes 0.06 0.00 - 0.07 K/uL   Schistocytes PRESENT    Tear Drop Cells PRESENT    Polychromasia PRESENT    Target Cells PRESENT  Comment: Performed at [facility], [address], [place], [place] [address]  Magnesium     Status: None   Collection Time: 01/23/23  9:05 AM  Result Value Ref Range   Magnesium 1.8 1.7 - 2.4 mg/dL    Comment: Performed at [facility], [address] [place], [place] [address]  Protime-INR     Status: Abnormal   Collection Time: 01/23/23  9:05 AM  Result Value Ref Range   Prothrombin Time 26.4 (H) 11.4 - 15.2 seconds   INR 2.5 (H) 0.8 - 1.2    Comment: (NOTE) INR goal varies based on device and disease states. Performed at [facility], [address] [place], [place] [address]   APTT     Status: Abnormal   Collection Time: 01/23/23  9:05 AM  Result Value Ref Range   aPTT 48 (H) 24 - 36 seconds    Comment:        IF BASELINE aPTT IS ELEVATED, SUGGEST PATIENT RISK ASSESSMENT BE USED TO DETERMINE APPROPRIATE ANTICOAGULANT THERAPY. Performed at [facility], [address], [place], [place] [address]   Ethanol     Status: Abnormal   Collection Time: 01/23/23  9:05 AM  Result Value Ref Range    Alcohol, Ethyl (B) 125 (H) <10 mg/dL    Comment: (NOTE) Lowest detectable limit for serum alcohol is 10 mg/dL.  For medical purposes only. Performed at [facility], [address], [place], [place] [address]   Sample to Blood Bank     Status: None   Collection Time: 01/23/23  9:05 AM  Result Value Ref Range   Blood Bank Specimen SAMPLE AVAILABLE FOR TESTING    Sample Expiration      01/26/2023,2359 Performed at [facility], [address], [place], [place] [address]   Type and screen [facility]     Status: None (Preliminary result)   Collection Time: 01/23/23  9:05 AM  Result Value Ref Range   ABO/RH(D) O POS    Antibody Screen NEG    Sample Expiration 01/26/2023,2359    Unit Number [id number]    Blood Component Type RED CELLS,LR    Unit division 00    Status of Unit ISSUED    Transfusion Status OK TO TRANSFUSE    Crossmatch Result Compatible    Unit Number [id number]    Blood Component Type RED CELLS,LR    Unit division 00    Status of Unit ISSUED    Transfusion Status OK TO TRANSFUSE    Crossmatch Result Compatible    Unit Number [id number]    Blood Component Type RED CELLS,LR    Unit division 00    Status of Unit ISSUED    Transfusion Status OK TO TRANSFUSE    Crossmatch Result Compatible    Unit Number [id number]    Blood Component Type RED CELLS,LR    Unit division 00    Status of Unit ISSUED    Transfusion Status OK TO TRANSFUSE    Crossmatch Result Compatible    Unit Number [id number]    Blood Component Type RED CELLS,LR    Unit division 00    Status of Unit ISSUED    Transfusion Status OK TO TRANSFUSE    Crossmatch Result Compatible    Unit Number [id number]    Blood Component Type RCLI PHER 1    Unit division 00    Status of Unit ISSUED  Transfusion Status OK TO TRANSFUSE    Crossmatch Result      Compatible Performed at [facility], [address], [place], [place] [address]   I-Stat Chem 8, ED     Status: Abnormal   Collection Time: 01/23/23  9:14 AM  Result Value Ref Range   Sodium 134 (L) 135 - 145 mmol/L   Potassium 3.2 (L) 3.5 - 5.1 mmol/L   Chloride 95 (L) 98 - 111 mmol/L   BUN 15 6 - 20 mg/dL   Creatinine, Ser 6.04 (H) 0.61 - 1.24 mg/dL   Glucose, Bld 540 (H) 70 - 99 mg/dL    Comment: Glucose reference range applies only to samples taken after fasting for at least 8 hours.   Calcium, Ion 0.99 (L) 1.15 - 1.40 mmol/L   TCO2 22 22 - 32 mmol/L   Hemoglobin 5.1 (LL) 13.0 - 17.0 g/dL   HCT 98.1 (L) 19.1 - 47.8 %   Comment NOTIFIED PHYSICIAN   Ammonia     Status: Abnormal   Collection Time: 01/23/23 10:19 AM  Result Value Ref Range   Ammonia 37 (H) 9 - 35 umol/L    Comment: Performed at [facility], [address] [place], [place] [address]  Prepare RBC (crossmatch)     Status: None   Collection Time: 01/23/23 10:30 AM  Result Value Ref Range   Order Confirmation      ORDER PROCESSED BY BLOOD BANK Performed at [facility], [address], [place], [place] [address]   MRSA Next Gen by PCR, Nasal     Status: None   Collection Time: 01/23/23 12:10 PM   Specimen: Nasal Mucosa; Nasal Swab  Result Value Ref Range   MRSA by PCR Next Gen NOT DETECTED NOT DETECTED    Comment: (NOTE) The GeneXpert MRSA Assay (FDA approved for NASAL specimens only), is one component of a comprehensive MRSA colonization surveillance program. It is not intended to diagnose MRSA infection nor to guide or monitor treatment for MRSA infections. Test performance is not FDA approved in patients less than 52 years old. Performed at [facility], [address], [place], [place] [address]   Glucose, capillary     Status: None   Collection Time: 01/23/23  1:31 PM  Result Value Ref Range   Glucose-Capillary 98 70 - 99 mg/dL    Comment: Glucose reference range applies only to samples  taken after fasting for at least 8 hours.   Comment 1 Notify RN    Comment 2 Document in Chart   Prepare RBC (crossmatch)     Status: None   Collection Time: 01/23/23  2:14 PM  Result Value Ref Range   Order Confirmation      ORDER PROCESSED BY BLOOD BANK Performed at [facility], [address], [place], [place] [address]   Glucose, capillary     Status: Abnormal   Collection Time: 01/23/23  4:11 PM  Result Value Ref Range   Glucose-Capillary 106 (H) 70 - 99 mg/dL    Comment: Glucose reference range applies only to samples taken after fasting for at least 8 hours.  Glucose, capillary     Status: Abnormal   Collection Time: 01/23/23  8:07 PM  Result Value Ref Range   Glucose-Capillary 113 (H) 70 - 99 mg/dL    Comment: Glucose reference range applies only to samples taken after fasting for at least 8 hours.  CBC  Status: Abnormal   Collection Time: 01/23/23  8:49 PM  Result Value Ref Range   WBC 7.3 4.0 - 10.5 K/uL   RBC 2.10 (L) 4.22 - 5.81 MIL/uL   Hemoglobin 5.3 (LL) 13.0 - 17.0 g/dL    Comment: CRITICAL VALUE NOTED.  VALUE IS CONSISTENT WITH PREVIOUSLY REPORTED AND CALLED VALUE. REPEATED TO VERIFY    HCT 17.2 (L) 39.0 - 52.0 %   MCV 81.9 80.0 - 100.0 fL   MCH 25.2 (L) 26.0 - 34.0 pg   MCHC 30.8 30.0 - 36.0 g/dL   RDW 16.1 (H) 09.6 - 04.5 %   Platelets 168 150 - 400 K/uL   nRBC 3.4 (H) 0.0 - 0.2 %    Comment: Performed at [facility], [address], [place], [place] [address]  Hemoglobin A1c     Status: None   Collection Time: 01/23/23  8:49 PM  Result Value Ref Range   Hgb A1c MFr Bld 4.8 4.8 - 5.6 %    Comment: (NOTE) Pre diabetes:          5.7%-6.4%  Diabetes:              >6.4%  Glycemic control for   <7.0% adults with diabetes    Mean Plasma Glucose 91.06 mg/dL    Comment: Performed at [facility], [address] [place], [place] [address]  Prepare RBC (crossmatch)     Status: None   Collection Time:  01/23/23 10:44 PM  Result Value Ref Range   Order Confirmation      ORDER PROCESSED BY BLOOD BANK Performed at [facility], [address], [place], [place] [address]   Prepare fresh frozen plasma     Status: None (Preliminary result)   Collection Time: 01/23/23 11:30 PM  Result Value Ref Range   Unit Number [id number]    Blood Component Type THW PLS APHR    Unit division A0    Status of Unit ISSUED    Transfusion Status OK TO TRANSFUSE    Unit Number [id number]    Blood Component Type THW PLS APHR    Unit division B0    Status of Unit ALLOCATED    Transfusion Status OK TO TRANSFUSE    Unit Number [id number]    Blood Component Type THW PLS APHR    Unit division A0    Status of Unit ALLOCATED    Transfusion Status OK TO TRANSFUSE   Glucose, capillary     Status: Abnormal   Collection Time: 01/24/23 12:08 AM  Result Value Ref Range   Glucose-Capillary 105 (H) 70 - 99 mg/dL    Comment: Glucose reference range applies only to samples taken after fasting for at least 8 hours.  Blood gas, arterial     Status: Abnormal   Collection Time: 01/24/23  1:22 AM  Result Value Ref Range   pH, Arterial 7.43 7.35 - 7.45   pCO2 arterial 35 32 - 48 mmHg   pO2, Arterial 79 (L) 83 - 108 mmHg   Bicarbonate 23.1 20.0 - 28.0 mmol/L   Acid-base deficit 0.5 0.0 - 2.0 mmol/L   O2 Saturation 100 %   Patient temperature 37.5    Collection site LEFT RADIAL    Drawn by [id number]    Allens test (pass/fail) PASS PASS    Comment: Performed at [facility], [address], [place], [place] [address]  Glucose, capillary     Status: Abnormal  Collection Time: 01/24/23  4:34 AM  Result Value Ref Range   Glucose-Capillary 107 (H) 70 - 99 mg/dL    Comment: Glucose reference range applies only to samples taken after fasting for at least 8 hours.  Magnesium     Status: None   Collection Time: 01/24/23  5:49 AM  Result Value Ref Range   Magnesium 1.8 1.7 - 2.4  mg/dL    Comment: Performed at [facility], [address] [place], [place] [address]  Phosphorus     Status: None   Collection Time: 01/24/23  5:49 AM  Result Value Ref Range   Phosphorus 4.5 2.5 - 4.6 mg/dL    Comment: ICTERUS AT THIS LEVEL MAY AFFECT RESULT Performed at [facility], [address], [place], [place] [address]   Basic metabolic panel     Status: Abnormal   Collection Time: 01/24/23  5:49 AM  Result Value Ref Range   Sodium 129 (L) 135 - 145 mmol/L   Potassium 3.5 3.5 - 5.1 mmol/L   Chloride 94 (L) 98 - 111 mmol/L   CO2 23 22 - 32 mmol/L   Glucose, Bld 106 (H) 70 - 99 mg/dL    Comment: Glucose reference range applies only to samples taken after fasting for at least 8 hours.   BUN 18 6 - 20 mg/dL   Creatinine, Ser 5.78 (H) 0.61 - 1.24 mg/dL   Calcium 7.9 (L) 8.9 - 10.3 mg/dL   GFR, Estimated >46 >96 mL/min    Comment: (NOTE) Calculated using the CKD-EPI Creatinine Equation (2021)    Anion gap 12 5 - 15    Comment: Performed at [facility], [address] [place], [place] [address]  Urinalysis, w/ Reflex to Culture (Infection Suspected) -Urine, Catheterized     Status: Abnormal   Collection Time: 01/24/23  8:06 AM  Result Value Ref Range   Specimen Source URINE, CATHETERIZED    Color, Urine AMBER (A) YELLOW    Comment: BIOCHEMICALS MAY BE AFFECTED BY COLOR   APPearance CLEAR CLEAR   Specific Gravity, Urine 1.028 1.005 - 1.030   pH 5.0 5.0 - 8.0   Glucose, UA NEGATIVE NEGATIVE mg/dL   Hgb urine dipstick MODERATE (A) NEGATIVE   Bilirubin Urine SMALL (A) NEGATIVE   Ketones, ur NEGATIVE NEGATIVE mg/dL   Protein, ur 30 (A) NEGATIVE mg/dL   Nitrite NEGATIVE NEGATIVE   Leukocytes,Ua NEGATIVE NEGATIVE   RBC / HPF 21-50 0 - 5 RBC/hpf   WBC, UA 6-10 0 - 5 WBC/hpf    Comment:        Reflex urine culture not performed if WBC <=10, OR if Squamous epithelial cells >5. If Squamous epithelial cells  >5 suggest recollection.    Bacteria, UA RARE (A) NONE SEEN   Squamous Epithelial / HPF 0-5 0 - 5 /HPF   Mucus PRESENT    Hyaline Casts, UA PRESENT     Comment: Performed at [facility], [address], [place], [place] [address]  Blood gas, arterial     Status: Abnormal   Collection Time: 01/24/23  8:29 AM  Result Value Ref Range   FIO2 100 %   Delivery systems VENTILATOR    Mode PRESSURE REGULATED VOLUME CONTROL    MECHVT 560 mL   RATE 24 resp/min   PEEP 8 cm H20   pH, Arterial 7.47 (H) 7.35 - 7.45   pCO2 arterial 36 32 - 48 mmHg   pO2, Arterial  244 (H) 83 - 108 mmHg   Bicarbonate 26.2 20.0 - 28.0 mmol/L   Acid-Base Excess 2.7 (H) 0.0 - 2.0 mmol/L   O2 Saturation 100 %   Patient temperature 36.7    Collection site LEFT RADIAL    Drawn by [id number]    Allens test (pass/fail) PASS PASS    Comment: Performed at [facility], [address], [place], [place] [address]  Glucose, capillary     Status: None   Collection Time: 01/24/23  8:31 AM  Result Value Ref Range   Glucose-Capillary 88 70 - 99 mg/dL    Comment: Glucose reference range applies only to samples taken after fasting for at least 8 hours.   Comment 1 Notify RN    Comment 2 Document in Chart     Studies/Results: DG CHEST PORT 1 VIEW  Result Date: 01/24/2023 CLINICAL DATA:  Intubation EXAM: PORTABLE CHEST 1 VIEW COMPARISON:  Portable exam 0759 hours compared to 01/24/2023 at 0731 hours FINDINGS: Tip of endotracheal tube now projects 5.8 cm above carina. Nasogastric tube extends into stomach. Enlargement of cardiac silhouette with pulmonary vascular congestion. Perihilar infiltrates favoring pulmonary edema. Small bibasilar effusions. No pneumothorax. IMPRESSION: CHF with small bibasilar effusions. Tip of endotracheal tube now projects 5.8 cm above carina. Electronically Signed   By: Ulyses Southward M.D.   On: 01/24/2023 08:24   DG CHEST PORT 1 VIEW  Result Date: 01/24/2023 CLINICAL  DATA:  Status post intubation. EXAM: PORTABLE CHEST 1 VIEW COMPARISON:  Earlier today FINDINGS: The ET tube tip is approximately 1.5 cm above the carina directed towards the right mainstem bronchus. Cardiac enlargement. Bilateral pleural effusions and interstitial edema appears similar to the previous exam. Visualized osseous structures are unremarkable. IMPRESSION: The ET tube tip is approximately 1.5 cm above the carina directed towards the right mainstem bronchus. Recommend retracting approximately 1-2 cm. No change in CHF pattern. These results will be called to the ordering clinician or representative by the Radiologist Assistant, and communication documented in the PACS or [employer]. Electronically Signed   By: Signa Kell M.D.   On: 01/24/2023 07:43   DG CHEST PORT 1 VIEW  Result Date: 01/24/2023 CLINICAL DATA:  Respiratory distress EXAM: PORTABLE CHEST 1 VIEW COMPARISON:  01/23/2023 FINDINGS: Cardiomegaly with vascular congestion. Bilateral airspace opacities most compatible with moderate edema. Probable layering bilateral effusions. No acute bony abnormality. IMPRESSION: Moderate CHF Electronically Signed   By: Charlett Nose M.D.   On: 01/24/2023 01:40   CT ANGIO ABD/PELVIS BRTO  Result Date: 01/23/2023 CLINICAL DATA:  Gastric varices severe anemia, gastric and duodenal varices, may need embolization EXAM: CTA ABDOMEN AND PELVIS WITHOUT AND WITH CONTRAST TECHNIQUE: Multidetector CT imaging of the abdomen and pelvis was performed using the standard protocol during bolus administration of intravenous contrast. Multiplanar reconstructed images and MIPs were obtained and reviewed to evaluate the vascular anatomy. RADIATION DOSE REDUCTION: This exam was performed according to the departmental dose-optimization program which includes automated exposure control, adjustment of the mA and/or kV according to patient size and/or use of iterative reconstruction technique. CONTRAST:  OMNIPAQUE  IOHEXOL 350 MG/ML SOLN COMPARISON:  10/26/2022 FINDINGS: VASCULAR Aorta: Normal caliber aorta without aneurysm, dissection, vasculitis or significant stenosis. Celiac: Patent without evidence of aneurysm, dissection, vasculitis or significant stenosis. SMA: Patent without evidence of aneurysm, dissection, vasculitis or significant stenosis. Renals: Both renal arteries are patent without evidence of aneurysm, dissection, vasculitis, fibromuscular dysplasia or significant stenosis. IMA: Patent without evidence of aneurysm, dissection,  vasculitis or significant stenosis. Inflow: Patent without evidence of aneurysm, dissection, vasculitis or significant stenosis. Proximal Outflow: Bilateral common femoral and visualized portions of the superficial and profunda femoral arteries are patent without evidence of aneurysm, dissection, vasculitis or significant stenosis. Veins: Patent hepatic veins, portal vein, SM V. There is a large venous collateral from the SMV to the left retroperitoneum ultimately draining into the left renal vein. There is a large tortuous left splenorenal shunt, with small gastric varices. Mild esophageal varices near the GE junction. Iliac venous system patent. 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 Review of the MIP images confirms the above findings. NON-VASCULAR Lower chest: Small pleural effusions right greater than left. Dependent atelectasis/consolidation in the lower lobes worse right than left. Hepatobiliary: Short-term liver with nodular contour. No mass or biliary ductal dilatation. Gallbladder physiologically distended with multiple small partially calcified layering stones in its dependent aspect. Pancreas: Unremarkable. No pancreatic ductal dilatation or surrounding inflammatory changes. Spleen: Borderline splenomegaly 12.4 cm maximum length. No focal lesion. Patent splenic vein with large left retroperitoneal splenorenal shunt. Adrenals/Urinary Tract: No adrenal mass.  Symmetric renal parenchymal enhancement. No urolithiasis or hydronephrosis. Urinary bladder incompletely distended. Stomach/Bowel: Stomach is partially distended, unremarkable. Small bowel decompressed. The colon is incompletely distended by gas and fecal material, unremarkable. Lymphatic: No adenopathy. Reproductive: Prostate is unremarkable. Other: Left pelvic phleboliths.  No ascites.  No free air. Musculoskeletal: Body wall anasarca.  Regional bones unremarkable. IMPRESSION: 1. Cirrhotic liver with borderline splenomegaly, large portosystemic venous collaterals, small gastric and esophageal varices. 2. No ascites. 3. Cholelithiasis. 4. Small bilateral pleural effusions right greater than left. Electronically Signed   By: Corlis Leak M.D.   On: 01/23/2023 12:50   DG Chest Port 1 View  Result Date: 01/23/2023 CLINICAL DATA:  Shortness of breath and tachycardia. EXAM: PORTABLE CHEST 1 VIEW COMPARISON:  01/12/2022 FINDINGS: The heart is enlarged but stable. External pacer paddles are noted. There is moderate vascular congestion and probable mild interstitial edema. Small bilateral pleural effusions are suspected. No infiltrates or pneumothorax. IMPRESSION: Cardiac enlargement, vascular congestion and probable mild interstitial edema. Suspect small bilateral pleural effusions. Electronically Signed   By: Rudie Meyer M.D.   On: 01/23/2023 09:50    Medications: I have reviewed the patient's current medications.  Assessment: Decompensated liver cirrhosis, T. 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 Currently on  octreotide infusion and PPI twice daily. MELD sodium 33 on admission with 65 to 66% estimated 90-day mortality. Continue supportive management with IV ceftriaxone every 24 hours. Recommend starting lactulose 20 g twice a day via NG tube if blood pressures allow and there is no evidence of hemodynamic instability/hypovolemia. Guarded prognosis.  Kerin Salen, MD 01/24/2023, 9:50 AM

## 2023-01-24 NOTE — Progress Notes (Signed)
Lab here to draw AM labs, contacted June RN at [facility] via secure chat to see if labs could be collected. Wait on labs until blood [place] finished.

## 2023-01-24 NOTE — Progress Notes (Addendum)
Did not respond to 80mg  lasix Echo report: Left ventricular ejection fraction, by estimation, is 50 to 55%. The left ventricle has low normal function. The left ventricle has no regional wall motion abnormalities. The right ventricular size is moderately enlarged. There is mildly elevated pulmonary artery systolic pressure. Right atrial size was mildly dilated. The inferior vena cava is dilated in size with <50% respiratory variability, suggesting right atrial pressure of 15 mmHg.  Respiratory failure Pulmonary edema Blood loss anemia  AKI Cirrhosis  Presumed UGIB   plan Increased to 120mg  x 1 Will cont to monitor  Might need to consider central access for CVP; especially if gets hypotensive  Transfusion for earlier completed.   ccm time incl bedtime face to face w/ nursing  Simonne Martinet ACNP-BC [facility] Pulmonary/Critical Care Pager # [phone] OR # [phone] if no answer

## 2023-01-24 NOTE — Progress Notes (Signed)
Spoke with J.Smith MD at [facility] via phone. MD requested to have unit of blood ran fast. This nurse set rate for blood set to infuse at 150 mL/hr due to patient presentation, respirations in the 50's, and crackles audible in lungs.

## 2023-01-24 NOTE — H&P (Addendum)
NAME:  [name], MRN:  [id number], DOB:  Jun 17, 1986, LOS: 1 ADMISSION DATE:  01/23/2023, CONSULTATION DATE:  01/24/23  REFERRING MD: ED Dr., CHIEF COMPLAINT: Weakness  Critical Care Progress Note  History of Present Illness:  37 year old man presents to the ED with weakness found to have hemoglobin of 3.  He denies any hematemesis.  No hemoptysis.  No hematochezia.  Denies melena.  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  Last drink 2 days prior to admission.  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  He was hospitalized 10/2022 with GI bleed.  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  Also some portal hypertensive gastropathy.  At home he takes Lasix and spironolactone for lower extremity edema.  He also takes lactulose.  Upon arrival, he was tachycardic to the 170s and SVT.  He was cardioverted.  He remained normotensive.  He remains tachycardic but less so.  His T. bili seems at baseline.  LFTs seem at recent baseline as well.  Last month notable for acute renal failure, severe elevation in creatinine.  Pertinent  Medical History  Alcoholic cirrhosis, known history of varices, known history of portal hypertensive gastropathy  Significant Hospital Events: Including procedures, antibiotic start and stop dates in addition to other pertinent events   01/23/2023 presents to ED with generalized weakness over the last several days, hemoglobin of 3, denies signs of GI bleeding. Initial PLTs 222, INR 2.5, scr 1.6 (baseline 0.5 range). Got blood (4 units PRBC) admitted to ICU. GI consulted. Placed on Octreotide AND PPI infusion. GI recommended CT angio abdomen and pelvis with BRTO. Empiric CTX initiated  4/26 developed increased WOB over course of night. Also having gross hematuria w/ clots. CT angio showed: 1. Cirrhotic liver with borderline splenomegaly, large portosystemic venous collaterals, small gastric and esophageal  varices. 2. No ascites. 3. Cholelithiasis. 4. Small bilateral pleural effusions right greater than left. Case d/w both IR and Urology by night coverage. Hgb at night 5.3. Got another 2 units of blood and also lasix. By 0600 had developed progressive resp failure and was intubated. Seen by urology. Interestingly no blood in foley after insertion.   Interim History / Subjective:  Now sedated on vent   Objective   Blood pressure (Abnormal) 145/62, pulse (Abnormal) 114, temperature 98.1 F (36.7 C), temperature source Oral, resp. rate (Abnormal) 57, height 5\' 9"  (1.753 m), weight 99.8 kg, SpO2 96 %.    Vent Mode: PRVC FiO2 (%):  [40 %-100 %] 100 % Set Rate:  [24 bmp] 24 bmp Vt Set:  [560 mL] 560 mL PEEP:  [10 cmH20] 10 cmH20 Plateau Pressure:  [29 cmH20] 29 cmH20   Intake/Output Summary (Last 24 hours) at 01/24/2023 0730 Last data filed at 01/24/2023 0500 Gross per 24 hour  Intake 2294.71 ml  Output 625 ml  Net 1669.71 ml   Filed Weights   01/23/23 0850  Weight: 99.8 kg    Examination:  General now sedated on vent w/ prop and fent HENT NCAT orally intubated.  Pulm coarse scattered rhonchi. No accessory use after intubation. PCXR ett about 6 cm above carina, GI tube below level of diaphragm. Bilateral edema Card RRR  Abd soft Ext warm diffuse anasarca w/ 3+ LE edema LLE wound dressing on place GU now has foley no gross hematuria currently  Neuro sedated but will wake up and is purposeful no focal motor def noted.   Resolved Hospital Problem  list     Assessment & Plan:  Acute blood loss anemia: mixed source Hematuria (and) GI. initially Suspected GI source given history of cirrhosis and prior findings on EGD, portal hypertensive gastropathy, varices. However he has had  No hematemesis.  Denies melena but did have sig. Hematuria voiding blood clots  -has been seen by urology. Interestingly does not have any gross hematuria at present. Could he have uretral lesion that's now  tamponaded w/ foley?  Plan Cont octreotide and PPI infusion Now intubated so UGI would be less risky (will see what GI says) Cont serial CBC gaol hgb > 7 Repeating vit K (am INR) Will send UA and UC.  Urology has seen. Can place irrigation foley if needed    Acute hypoxic respiratory failure due to diffuse pulmonary edema +/- aspiration (favor edema) Plan Cont full vent support F/u abg (ETT pulled back) will f/u repeat imaging VAP bundle  PAD protocol RASS goal -2 Ck sputum culture  Cont CTX day 2 (will use for both asp and empiric GI coverage)  Trend PCT  Lasix  F/u echo  Acute kidney failure & hematuria : initially felt 2/2 hypovolemia. But hematuria earlier makes me wonder about post-obstructive process. Now has foley, had lasix earlier today so urine Na and cr not likely to be accurate Plan MAP > 65 Getting lasix for edema KVO IVFs  UA and UC  Keep foley in place Renal US   Hyponatremia: felt initially hypovolemia but has dropped w/ volume resuscitation. I favor hypervolemia at this point  Plan KVO IVFs Lasix  Follow chems    LFT elevation, hyperbilirubinemia: In the setting of cirrhosis, bilirubin appears near more recent baseline. Plan Continue to monitor  Cirrhosis: Presumed alcoholic cirrhosis.  He states he was sober for 8 months but relapsed 2 days ago, last drink 2 days prior to admission. Plan Monitor LFTs  Hepatic encephalopathy: His mental status was clear and coherent. Plan Can resume lactulose   LLE leg wound Seen by WOC. Measurements:  3 cms x 2 cms x 0.4 cm  Plan Clean site w/ NS and apply medihoney to wound daily then fill wound w/ saline moistened gauze and cover w/ dry gauze and silicone foam  Should f/u w/ wound center at dc  Glycemic control  Plan Ssi   Best Practice (right click and "Reselect all SmartList Selections" daily)   Diet/type: NPO w/ oral meds DVT prophylaxis: SCD,  GI prophylaxis: N/A Lines: N/A Foley:  Yes, and it  is still needed Code Status:  full code Last date of multidisciplinary goals of care discussion [discussed CODE STATUS with patient admission, he confirms full code]   Critical care time: 45 minutes       Shelby Mattocks, NP

## 2023-01-24 NOTE — Progress Notes (Signed)
Gross hematuria overnight.  Foley being placed, may need CBI depending on what urine looks like.

## 2023-01-25 ENCOUNTER — Inpatient Hospital Stay (HOSPITAL_COMMUNITY): Payer: Medicaid Other

## 2023-01-25 ENCOUNTER — Encounter (HOSPITAL_COMMUNITY): Payer: Self-pay | Admitting: Pulmonary Disease

## 2023-01-25 ENCOUNTER — Encounter (HOSPITAL_COMMUNITY): Admission: EM | Disposition: E | Payer: Self-pay | Source: Home / Self Care | Attending: Emergency Medicine

## 2023-01-25 DIAGNOSIS — J9601 Acute respiratory failure with hypoxia: Secondary | ICD-10-CM | POA: Diagnosis not present

## 2023-01-25 HISTORY — PX: ESOPHAGOGASTRODUODENOSCOPY (EGD) WITH PROPOFOL: SHX5813

## 2023-01-25 LAB — GLUCOSE, CAPILLARY
Glucose-Capillary: 101 mg/dL — ABNORMAL HIGH (ref 70–99)
Glucose-Capillary: 112 mg/dL — ABNORMAL HIGH (ref 70–99)
Glucose-Capillary: 76 mg/dL (ref 70–99)
Glucose-Capillary: 88 mg/dL (ref 70–99)
Glucose-Capillary: 90 mg/dL (ref 70–99)
Glucose-Capillary: 94 mg/dL (ref 70–99)
Glucose-Capillary: 96 mg/dL (ref 70–99)
Glucose-Capillary: 98 mg/dL (ref 70–99)
Glucose-Capillary: 99 mg/dL (ref 70–99)

## 2023-01-25 LAB — CBC
HCT: 23.7 % — ABNORMAL LOW (ref 39.0–52.0)
Hemoglobin: 7.5 g/dL — ABNORMAL LOW (ref 13.0–17.0)
MCH: 27.2 pg (ref 26.0–34.0)
MCHC: 31.6 g/dL (ref 30.0–36.0)
MCV: 85.9 fL (ref 80.0–100.0)
Platelets: 160 10*3/uL (ref 150–400)
RBC: 2.76 MIL/uL — ABNORMAL LOW (ref 4.22–5.81)
RDW: 19.5 % — ABNORMAL HIGH (ref 11.5–15.5)
WBC: 8.7 10*3/uL (ref 4.0–10.5)
nRBC: 6.2 % — ABNORMAL HIGH (ref 0.0–0.2)

## 2023-01-25 LAB — TRIGLYCERIDES: Triglycerides: 102 mg/dL (ref <150)

## 2023-01-25 LAB — COMPREHENSIVE METABOLIC PANEL
ALT: 16 U/L (ref 0–44)
AST: 52 U/L — ABNORMAL HIGH (ref 15–41)
Albumin: 2.3 g/dL — ABNORMAL LOW (ref 3.5–5.0)
Alkaline Phosphatase: 64 U/L (ref 38–126)
Anion gap: 11 (ref 5–15)
BUN: 23 mg/dL — ABNORMAL HIGH (ref 6–20)
CO2: 22 mmol/L (ref 22–32)
Calcium: 8 mg/dL — ABNORMAL LOW (ref 8.9–10.3)
Chloride: 97 mmol/L — ABNORMAL LOW (ref 98–111)
Creatinine, Ser: 2.16 mg/dL — ABNORMAL HIGH (ref 0.61–1.24)
GFR, Estimated: 39 mL/min — ABNORMAL LOW (ref >60)
Glucose, Bld: 140 mg/dL — ABNORMAL HIGH (ref 70–99)
Potassium: 3.4 mmol/L — ABNORMAL LOW (ref 3.5–5.1)
Sodium: 130 mmol/L — ABNORMAL LOW (ref 135–145)
Total Bilirubin: 14.5 mg/dL — ABNORMAL HIGH (ref 0.3–1.2)
Total Protein: 8.4 g/dL — ABNORMAL HIGH (ref 6.5–8.1)

## 2023-01-25 LAB — PROTIME-INR
INR: 2.5 — ABNORMAL HIGH (ref 0.8–1.2)
Prothrombin Time: 26.9 seconds — ABNORMAL HIGH (ref 11.4–15.2)

## 2023-01-25 LAB — PROCALCITONIN: Procalcitonin: 0.6 ng/mL

## 2023-01-25 SURGERY — ESOPHAGOGASTRODUODENOSCOPY (EGD) WITH PROPOFOL
Anesthesia: Moderate Sedation

## 2023-01-25 MED ORDER — POTASSIUM CHLORIDE 10 MEQ/100ML IV SOLN
10.0000 meq | Freq: Once | INTRAVENOUS | Status: AC
  Administered 2023-01-25: 10 meq via INTRAVENOUS
  Filled 2023-01-25: qty 100

## 2023-01-25 MED ORDER — ALBUMIN HUMAN 25 % IV SOLN: 12.5000 g | Freq: Once | INTRAVENOUS | Status: DC

## 2023-01-25 MED ORDER — MIDAZOLAM HCL (PF) 5 MG/ML IJ SOLN
INTRAMUSCULAR | Status: AC
  Filled 2023-01-25: qty 2

## 2023-01-25 MED ORDER — ALBUMIN HUMAN 25 % IV SOLN
12.5000 g | Freq: Once | INTRAVENOUS | Status: AC
  Administered 2023-01-25: 12.5 g via INTRAVENOUS
  Filled 2023-01-25: qty 50

## 2023-01-25 MED ORDER — POTASSIUM CHLORIDE 10 MEQ/50ML IV SOLN: 10.0000 meq | Freq: Once | INTRAVENOUS | Status: DC

## 2023-01-25 MED ORDER — DEXTROSE 50 % IV SOLN
12.5000 g | INTRAVENOUS | Status: AC
  Administered 2023-01-25: 12.5 g via INTRAVENOUS
  Filled 2023-01-25: qty 50

## 2023-01-25 MED ORDER — DEXTROSE-NACL 5-0.9 % IV SOLN: INTRAVENOUS | Status: DC

## 2023-01-25 MED ORDER — FENTANYL CITRATE (PF) 100 MCG/2ML IJ SOLN
INTRAMUSCULAR | Status: AC
  Filled 2023-01-25: qty 4

## 2023-01-25 MED ORDER — FENTANYL CITRATE (PF) 100 MCG/2ML IJ SOLN
INTRAMUSCULAR | Status: DC | PRN
  Administered 2023-01-25 (×3): 25 ug via INTRAVENOUS

## 2023-01-25 MED ORDER — PREDNISOLONE SODIUM PHOSPHATE 15 MG/5ML PO SOLN
40.0000 mg | Freq: Every day | ORAL | Status: DC
  Administered 2023-01-25 – 2023-02-01 (×8): 40 mg
  Filled 2023-01-25 (×10): qty 15

## 2023-01-25 MED ORDER — MIDAZOLAM HCL (PF) 5 MG/ML IJ SOLN
INTRAMUSCULAR | Status: DC | PRN
  Administered 2023-01-25 (×2): 1 mg via INTRAVENOUS
  Administered 2023-01-25: 2 mg via INTRAVENOUS
  Administered 2023-01-25: 1 mg via INTRAVENOUS

## 2023-01-25 SURGICAL SUPPLY — 15 items

## 2023-01-25 NOTE — Progress Notes (Signed)
eLink Physician-Brief Progress Note Patient Name: [name] DOB: 05-Jul-1986 MRN: [id number]   Date of Service  01/25/2023  HPI/Events of Note  37 year old male in ICU with severe anemia.  Patient is intubated.  I was asked to evaluate patient on account of low blood pressure.  eICU Interventions  Patient has low diastolic blood pressures but with systolic from baseline.  No evidence of endorgan malperfusion.  No intervention planned.        Carilyn Goodpasture 01/25/2023, 11:27 PM

## 2023-01-25 NOTE — Progress Notes (Signed)
[facility] Gastroenterology Progress Note  [name] 37 y.o. [birth year]  CC: Decompensated cirrhosis, severe anemia   Subjective: Patient seen and examined at bedside.  He remains intubated.  Discussed with RN at bedside.  No acute events overnight.  ROS : Unable to obtain   Objective: Vital signs in last 24 hours: Vitals:   01/25/23 0800 01/25/23 0900  BP: (!) 119/57 (!) 116/48  Pulse: (!) 104 (!) 103  Resp: (!) 22 (!) 21  Temp:    SpO2: 95% 95%    Physical Exam: Sedated, intubated.  Abdomen is somewhat distended but bowel sounds present, no peritoneal signs.  Lab Results: Recent Labs    01/23/23 0905 01/23/23 0914 01/24/23 0549 01/24/23 1650 01/25/23 0727  NA 130*   < > 129* 130* 130*  K 3.2*   < > 3.5 3.4* 3.4*  CL 95*   < > 94* 97* 97*  CO2 21*  --  23 22 22   GLUCOSE 115*   < > 106* 96 140*  BUN 17   < > 18 19 23*  CREATININE 1.60*   < > 1.50* 1.68* 2.16*  CALCIUM 7.7*  --  7.9* 7.7* 8.0*  MG 1.8  --  1.8  --   --   PHOS  --   --  4.5  --   --    < > = values in this interval not displayed.   Recent Labs    01/24/23 1650 01/25/23 0727  AST 43* 52*  ALT 17 16  ALKPHOS 66 64  BILITOT 14.4* 14.5*  PROT 8.1 8.4*  ALBUMIN 2.3* 2.3*   Recent Labs    01/23/23 0905 01/23/23 0914 01/24/23 1650 01/25/23 0727  WBC 6.1   < > 6.6 8.7  NEUTROABS 4.0  --   --   --   HGB 3.2*   < > 7.0* 7.5*  HCT 11.3*   < > 22.2* 23.7*  MCV 78.5*   < > 83.8 85.9  PLT 222   < > 151 160   < > = values in this interval not displayed.   Recent Labs    01/24/23 1844 01/25/23 0727  LABPROT 25.2* 26.9*  INR 2.3* 2.5*      Assessment/Plan: -Severe anemia in setting of decompensated cirrhosis.  Hemoglobin of 3.2 on admission.  INR 2.5 today.  CT angio GI bleed protocol negative for any active bleeding.  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 -Abnormal LFTs with jaundice.  T. bili 14.5.  Likely from decompensated  cirrhosis. 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 -Hematuria with reported 300 cc of urinary output as blood and blood clots.  Seen by urology.  Hematuria likely attributed to coagulopathy and probably hyperbilirubinemia.  Recommended outpatient workup. -SVT during ED presentation.  S/p cardioversion.  Recommendations ------------------------- -Interventional radiology notes as well as recent progress notes reviewed.  According to IR, not a candidate for TIPS because of advanced MELD score and unusual vascular anatomy. -Continue Rocephin, octreotide and PPI. - transfuse as needed to keep hemoglobin more than 7 -Plan for EGD today to rule out upper GI source of his severe anemia. -Prognosis remains guarded   Kathi Der MD, FACP 01/25/2023, 9:35 AM  Contact #  [phone]

## 2023-01-25 NOTE — Brief Op Note (Signed)
01/25/2023  1:12 PM  PATIENT:  [name]  37 y.o. male  PRE-OPERATIVE DIAGNOSIS:  anemia, cirrhosis, varices in esophagus, stomach and duodenum  POST-OPERATIVE DIAGNOSIS:  no bleeding noted  PROCEDURE:  Procedure(s): ESOPHAGOGASTRODUODENOSCOPY (EGD) WITH PROPOFOL (N/A)  SURGEON:  Surgeon(s) and Role:    * Demetrica Zipp, MD - Primary  Findings --------------- -EGD showed small esophageal varices.  G-tube extending up to antrum.  No evidence of active bleeding.  Recommendations ------------------------- -Continue octreotide drip for now. -ok to extubated from GI standpoint -Repeat labs in the morning -GI will follow  Kathi Der MD, FACP 01/25/2023, 1:13 PM  Contact #  [phone]

## 2023-01-25 NOTE — Progress Notes (Signed)
Urine output 100 cc since beginning of shift. Ramaswamy MD notified. Prednisolone to be administered once OG tube placement confirmed. Albumin ordered   All sedation off, restraints removed. No signs of distress or agitation throughout the day. Patient still sleepy but responds to voice and can lift arms off bed at this time.

## 2023-01-25 NOTE — Progress Notes (Signed)
eLink Physician-Brief Progress Note Patient Name: [name] DOB: 12-12-1985 MRN: [id number]   Date of Service  01/25/2023  HPI/Events of Note  Hypoglycemia.    eICU Interventions  D5NS at 50 cc/hr ordered     Intervention Category Intermediate Interventions: Other:  Henry Russel, P 01/25/2023, 1:08 AM

## 2023-01-25 NOTE — Op Note (Signed)
[facility] Patient Name: [name] Procedure Date: 01/25/2023 MRN: [id number] Attending MD: Kathi Der , MD, [id number] Date of Birth: 04/25/1986 CSN: [id number] Age: 37 Admit Type: Inpatient Procedure:                Upper GI endoscopy Indications:              Suspected upper gastrointestinal bleeding Providers:                Kathi Der, MD, Margaree Mackintosh, RN,                            Marja Kays, Technician Referring MD:              Medicines:                Fentanyl 75 micrograms IV, Midazolam 5 mg IV Complications:            No immediate complications. Estimated Blood Loss:     Estimated blood loss was minimal. Procedure:                Pre-Anesthesia Assessment:                           - Prior to the procedure, a History and Physical                            was performed, and patient medications and                            allergies were reviewed. The patient's tolerance of                            previous anesthesia was also reviewed. The risks                            and benefits of the procedure and the sedation                            options and risks were discussed with the patient.                            All questions were answered, and informed consent                            was obtained. Prior Anticoagulants: The patient has                            taken no anticoagulant or antiplatelet agents. ASA                            Grade Assessment: IV - A patient with severe                            systemic disease that is a constant threat to life.  After reviewing the risks and benefits, the patient                            was deemed in satisfactory condition to undergo the                            procedure.                           After obtaining informed consent, the endoscope was                            passed under direct vision. Throughout the                             procedure, the patient's blood pressure, pulse, and                            oxygen saturations were monitored continuously. The                            [id number]-[id number] ([id number]) Olympus endoscope was introduced                            through the mouth, and advanced to the second part                            of duodenum. The upper GI endoscopy was technically                            difficult and complex. The patient tolerated the                            procedure well. Scope In: Scope Out: Findings:      Small (< 5 mm) varices were found in the distal esophagus.      Diffuse moderate inflammation characterized by congestion (edema),       erosions and erythema was found in the entire examined stomach.      The cardia and gastric fundus were normal on retroflexion.      The duodenal bulb, first portion of the duodenum and second portion of       the duodenum were normal.      There is no endoscopic evidence of bleeding in the entire examined       stomach. Impression:               - Small (< 5 mm) esophageal varices.                           - Gastritis.                           - Normal duodenal bulb, first portion of the                            duodenum and second portion  of the duodenum.                           - No specimens collected. Moderate Sedation:      Moderate (conscious) sedation was administered by the nurse and       supervised by the endoscopist. The following parameters were monitored:       oxygen saturation, heart rate, blood pressure, and response to care. Recommendation:           - Return patient to ICU for ongoing care.                           - Resume previous diet.                           - Continue present medications. Procedure Code(s):        --- Professional ---                           [id number], Esophagogastroduodenoscopy, flexible,                            transoral; diagnostic, including collection of                             specimen(s) by brushing or washing, when performed                            (separate procedure) Diagnosis Code(s):        --- Professional ---                           I85.00, Esophageal varices without bleeding                           K29.70, Gastritis, unspecified, without bleeding CPT copyright 2022 [employer]. All rights reserved. The codes documented in this report are preliminary and upon coder review may  be revised to meet current compliance requirements. Kathi Der, MD Kathi Der, MD 01/25/2023 2:20:03 PM Number of Addenda: 0

## 2023-01-25 NOTE — H&P (Signed)
NAME:  [name], MRN:  [id number], DOB:  1986-07-06, LOS: 2 ADMISSION DATE:  01/23/2023, CONSULTATION DATE:  01/25/23  REFERRING MD: ED Dr., CHIEF COMPLAINT: Weakness  Critical Care Progress Note  History of Present Illness:  37 year old man presents to the ED with weakness found to have hemoglobin of 3.  He denies any hematemesis.  No hemoptysis.  No hematochezia.  Denies melena.  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  Last drink 2 days prior to admission.  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  He was hospitalized 10/2022 with GI bleed.  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  Also some portal hypertensive gastropathy.  At home he takes Lasix and spironolactone for lower extremity edema.  He also takes lactulose.  Upon arrival, he was tachycardic to the 170s and SVT.  He was cardioverted.  He remained normotensive.  He remains tachycardic but less so.  His T. bili seems at baseline.  LFTs seem at recent baseline as well.  Last month notable for acute renal failure, severe elevation in creatinine.  Pertinent  Medical History  Alcoholic cirrhosis, known history of varices, known history of portal hypertensive gastropathy  Significant Hospital Events: Including procedures, antibiotic start and stop dates in addition to other pertinent events   01/23/2023 presents to ED with generalized weakness over the last several days, hemoglobin of 3, denies signs of GI bleeding. Initial PLTs 222, INR 2.5, scr 1.6 (baseline 0.5 range). Got blood (4 units PRBC) admitted to ICU. GI consulted. Placed on Octreotide AND PPI infusion. GI recommended CT angio abdomen and pelvis with BRTO. Empiric CTX initiated  4/26 developed increased WOB over course of night. Also having gross hematuria w/ clots. CT angio showed: 1. Cirrhotic liver with borderline splenomegaly, large portosystemic venous collaterals, small gastric and esophageal  varices. 2. No ascites. 3. Cholelithiasis. 4. Small bilateral pleural effusions right greater than left. Case d/w both IR and Urology by night coverage. Hgb at night 5.3. Got another 2 units of blood and also lasix. By 0600 had developed progressive resp failure and was intubated. Seen by urology. Interestingly no blood in foley after insertion.  Intubated by [facility] GI consult Dr Marca Ancona - MELD-Na 33 Uro consult 4/26 IR consult Dr Lowella Dandy- , not a candidate for TIPS because of advanced MELD score and unusual vascular anatomy.  Did not respond to lasix 80mg   -> ECHO ef 55% -> lasix 120mg  Iv x 1 given Tracheal aspirate: MRSA PCR negative  Interim History / Subjective:    4/27  -remains on the ventilator FiO2 40%.  [facility] plans endoscopy later today.  Is on octreotide infusion, propofol infusion Fentanyl infusion along with D5 normal saline.  He is on antibiotic Rocephin.  Remains afebrile.  Worsening renal failure with creatinine 2.16.  Made 680 cc of urine yesterday. Objective   Blood pressure (!) 116/48, pulse (!) 103, temperature 99.4 F (37.4 C), temperature source Axillary, resp. rate (!) 21, height 5\' 9"  (1.753 m), weight 120.4 kg, SpO2 95 %.    Vent Mode: PRVC FiO2 (%):  [30 %-100 %] 40 % Set Rate:  [20 bmp] 20 bmp Vt Set:  [560 mL] 560 mL PEEP:  [8 cmH20] 8 cmH20 Plateau Pressure:  [23 cmH20-29 cmH20] 24 cmH20   Intake/Output Summary (Last 24 hours) at 01/25/2023 1100 Last data filed at 01/25/2023 1035 Gross per 24 hour  Intake 3806.02 ml  Output 623 ml  Net 3183.02 ml   Filed Weights   01/23/23 0850 01/25/23 0400  Weight: 99.8 kg 120.4 kg    Examination:  General Appearance:  Looks criticall ill OBESE - + Head:  Normocephalic, without obvious abnormality, atraumatic Eyes:  PERRL - yes, conjunctiva/corneas - muddy     Ears:  Normal external ear canals, both ears Nose:  G tube - no Throat:  ETT TUBE - yes , OG tube - yes Neck:  Supple,  No  enlargement/tenderness/nodules Lungs: Clear to auscultation bilaterally, Ventilator   Synchrony - yd Heart:  S1 and S2 normal, no murmur, CVP - no.  Pressors - yes Abdomen:  Soft, no masses, no organomegaly Genitalia / Rectal:  Not done Extremities:  Extremities- intact with edema Skin:  ntact in exposed areas . Sacral area - not examine Neurologic:  Sedation - Fent gtt, diprivan gtt -> RASS - -3 .     Resolved Hospital Problem list     Assessment & Plan:  Acute blood loss severe anemia: mixed source Hematuria (and) GI. initially Suspected GI source given history of cirrhosis and prior findings on EGD, portal hypertensive gastropathy, varices. However he has had  No hematemesis.  Denies melena but did have sig. Hematuria voiding blood clots  -has been seen by urology. Interestingly does not have any gross hematuria at present. Could he have uretral lesion that's now tamponaded w/ foley?   4/27 = suspected portal gastropathy bleed.  EGD pending later today.  No active bleeding.  Not a candidate for IR procedure  Plan Cont octreotide and PPI infusion Cont serial CBC gaol hgb > 7 Repeating vit K (am INR) Will send UA and UC.  Urology has seen. Can place irrigation foley if needed    Acute hypoxic respiratory failure due to diffuse pulmonary edema +/- aspiration (favor edema)  01/25/2023: Does not meet extubation criteria because of need for GI endoscopy and worsening renal insufficiency.  Plan Cont full vent support   Acute kidney failure & hematuria : initially felt 2/2 hypovolemia. But hematuria earlier makes me wonder about post-obstructive process. Now has foley, had lasix earlier today so urine Na and cr not likely to be accurate  01/25/2023: AKI slowly getting worse  Plan Stop Lasix Give albumin Monitor urine output    LFT elevation, hyperbilirubinemi with concern for acute alcoholic hepatitis diagnosed 01/25/2023: In the setting of cirrhosis, bilirubin appears near  more recent baseline.  MELD score 33 on 01/24/23.  Maddrey discriminant function score 68.3 on 01/25/2023  4/77/24: On lactulose.?  Seems to meet criteria for acute alcoholic hepatitis [AST greater than ALT 2: 1, AST less than 300, increased bilirubin, increased INR although no leukocytosis].  Maddrey discriminant function score 68.3  Plan Start prednisolone 40 mg/day on 01/25/2023 Continue to monitor Check ammonia levels  Presumed cirrhosis with nodular contour on ultrasound right upper quadrant 07/26/2022 and?  Early cirrhosis July 2022 C abdomen Confirmed cirrhosis on CT angio abdomen 10/26/2022 Cirrhosis: Presumed alcoholic cirrhosis.  He states he was sober for 8 months but relapsed 2 days ago, last drink 2 days prior to admission.  01/25/2023 concern for acute alcoholic hepatitis.  Maddrey discriminant function 68.3   Plan Start prednisolone 40 mg/day Monitor LFTs Check RUQ [place] 01/25/23 Hep Virus panel per GI 01/25/23   Hepatic encephalopathy: His mental status was clear and coherent.   Plan lactulose since 01/25/2023  LLE leg wound Seen by WOC. Measurements:  3 cms x 2 cms x 0.4 cm  Plan Clean  site w/ NS and apply medihoney to wound daily then fill wound w/ saline moistened gauze and cover w/ dry gauze and silicone foam  Should f/u w/ wound center at dc  Glycemic control  Plan Ssi   Best Practice (right click and "Reselect all SmartList Selections" daily)   Diet/type: NPO w/ oral meds DVT prophylaxis: SCD,  GI prophylaxis: N/A Lines: N/A Foley:  Yes, and it is still needed Code Status:  full code Last date of multidisciplinary goals of care discussion [discussed CODE STATUS with patient admission, he confirms full code]  01/25/2023: Mom and dad at the bedside.  [place]  Patient lives here with his significant other.  The mom is designated the fianc/girlfriend and the brother is the healthcare decision makers.  Mom admits to patient drinking alcohol.   Mom believes that with the fate of got they can fight through this critical illness.  I did indicate a poor prognosis.  Nurses spoken to the girlfriend and is mutual agreement that the brother will be the main signature for healthcare documents.     ATTESTATION & SIGNATURE   The patient [name] is critically ill with multiple organ systems failure and requires high complexity decision making for assessment and support, frequent evaluation and titration of therapies, application of advanced monitoring technologies and extensive interpretation of multiple databases and discussion with other appropriate health care personnel such as bedside nurses, social workers, case Production designer, theatre/television/film, consultants, respiratory therapists, nutritionists, secretaries etc.,  Critical care time includes but is not restricted to just documentation time. Documentation can happen in parallel or sequential to care time depending on case mix urgency and priorities for the shift. So, overall critical Care Time devoted to patient care services described in this note is  40  Minutes.   This time reflects time of care of this signee Dr Kalman Shan which includ does not reflect procedure time, or teaching time or supervisory time of PA/NP/Med student/Med Resident etc but could involve care discussion time     Dr. Kalman Shan, M.D., [facility].C.P Pulmonary and Critical Care Medicine Medical Director - [facility] ICU Staff Physician, [facility] Pager: [phone], If no answer or between  15:00h - 7:00h: call [phone]  01/25/2023 11:00 AM    LABS    PULMONARY Recent Labs  Lab 01/23/23 0914 01/24/23 0122 01/24/23 0829  PHART  --  7.43 7.47*  PCO2ART  --  35 36  PO2ART  --  79* 244*  HCO3  --  23.1 26.2  TCO2 22  --   --   O2SAT  --  100 100    CBC Recent Labs  Lab 01/24/23 1118 01/24/23 1650 01/25/23 0727  HGB 6.7* 7.0* 7.5*  HCT 21.7* 22.2*  23.7*  WBC 6.8 6.6 8.7  PLT 160 151 160    COAGULATION Recent Labs  Lab 01/23/23 0905 01/24/23 1844 01/25/23 0727  INR 2.5* 2.3* 2.5*    CARDIAC  No results for input(s): "TROPONINI" in the last 168 hours. No results for input(s): "PROBNP" in the last 168 hours.   CHEMISTRY Recent Labs  Lab 01/23/23 0905 01/23/23 0914 01/24/23 0549 01/24/23 1650 01/25/23 0727  NA 130* 134* 129* 130* 130*  K 3.2* 3.2* 3.5 3.4* 3.4*  CL 95* 95* 94* 97* 97*  CO2 21*  --  23 22 22   GLUCOSE 115* 114* 106* 96 140*  BUN 17 15 18  19 23*  CREATININE 1.60* 1.80* 1.50* 1.68* 2.16*  CALCIUM 7.7*  --  7.9* 7.7* 8.0*  MG 1.8  --  1.8  --   --   PHOS  --   --  4.5  --   --    Estimated Creatinine Clearance: 58.8 mL/min (A) (by C-G formula based on SCr of 2.16 mg/dL (H)).   LIVER Recent Labs  Lab 01/23/23 0905 01/24/23 1650 01/24/23 1844 01/25/23 0727  AST 62* 43*  --  52*  ALT 18 17  --  16  ALKPHOS 79 66  --  64  BILITOT 12.1* 14.4*  --  14.5*  PROT 9.1* 8.1  --  8.4*  ALBUMIN 2.3* 2.3*  --  2.3*  INR 2.5*  --  2.3* 2.5*     INFECTIOUS Recent Labs  Lab 01/24/23 1118 01/25/23 0727  PROCALCITON 0.29 0.60     ENDOCRINE CBG (last 3)  Recent Labs    01/25/23 0106 01/25/23 0403 01/25/23 0804  GLUCAP 96 90 94         IMAGING x48h  - image(s) personally visualized  -   highlighted in bold DG Chest Port 1 View  Result Date: 01/25/2023 CLINICAL DATA:  Pulmonary edema. EXAM: PORTABLE CHEST 1 VIEW COMPARISON:  01/24/2023 FINDINGS: Endotracheal tube is 5.0 cm above the carina. Nasogastric tube extends into the abdomen. Not clear if the tip is beyond the image. Stable enlargement of the cardiac silhouette. Persistent perihilar densities are suggestive for edema. Hazy densities at lung bases could represent atelectasis and/or pleural fluid. Negative for a pneumothorax. IMPRESSION: 1. Cardiomegaly with perihilar densities and findings are suggestive for pulmonary edema. Chest  radiograph findings have minimally changed. 2. Hazy densities at the lung bases could represent atelectasis and/or pleural fluid. 3. Support apparatuses as described. Electronically Signed   By: Richarda Overlie M.D.   On: 01/25/2023 08:37   ECHOCARDIOGRAM COMPLETE  Result Date: 01/24/2023    ECHOCARDIOGRAM REPORT   Patient Name:   [name] Date of Exam: 01/24/2023 Medical Rec #:  [id number]      Height:       69.0 in Accession #:    [id number]     Weight:       220.0 lb Date of Birth:  03-31-1986       BSA:          2.151 m Patient Age:    37 years       BP:           145/62 mmHg Patient Gender: M              HR:           99 bpm. Exam Location:  Inpatient Procedure: 2D Echo, Color Doppler and Cardiac Doppler Indications:    CHF  History:        Patient has prior history of Echocardiogram examinations, most                 recent 01/12/2022. Risk Factors:Hypertension.  Sonographer:    Milbert Coulter Referring Phys: [phone] [facility]  Sonographer Comments: Echo performed with patient supine and on artificial respirator. IMPRESSIONS  1. Left ventricular ejection fraction, by estimation, is 50 to 55%. The left ventricle has low normal function. The left ventricle has no regional wall motion abnormalities. There is mild left ventricular hypertrophy. Left ventricular diastolic parameters are indeterminate.  2. Right ventricular systolic function is normal. The right ventricular size is moderately enlarged.  There is mildly elevated pulmonary artery systolic pressure.  3. Right atrial size was mildly dilated.  4. No evidence of mitral valve regurgitation.  5. Tricuspid valve regurgitation is mild to moderate.  6. The aortic valve was not well visualized. Aortic valve regurgitation is not visualized.  7. Aortic not well visualized.  8. The inferior vena cava is dilated in size with <50% respiratory variability, suggesting right atrial pressure of 15 mmHg. FINDINGS  Left Ventricle: Left ventricular ejection fraction, by  estimation, is 50 to 55%. The left ventricle has low normal function. The left ventricle has no regional wall motion abnormalities. The left ventricular internal cavity size was normal in size. There is mild left ventricular hypertrophy. Left ventricular diastolic parameters are indeterminate. Right Ventricle: The right ventricular size is moderately enlarged. Right ventricular systolic function is normal. There is mildly elevated pulmonary artery systolic pressure. The tricuspid regurgitant velocity is 2.69 m/s, and with an assumed right atrial pressure of 15 mmHg, the estimated right ventricular systolic pressure is 43.9 mmHg. Left Atrium: Left atrial size was normal in size. Right Atrium: Right atrial size was mildly dilated. Pericardium: There is no evidence of pericardial effusion. Mitral Valve: No evidence of mitral valve regurgitation. Tricuspid Valve: Tricuspid valve regurgitation is mild to moderate. Aortic Valve: The aortic valve was not well visualized. Aortic valve regurgitation is not visualized. Pulmonic Valve: Pulmonic valve regurgitation is not visualized. Aorta: Not well visualized. Venous: The inferior vena cava is dilated in size with less than 50% respiratory variability, suggesting right atrial pressure of 15 mmHg. IAS/Shunts: The interatrial septum was not well visualized.  LEFT VENTRICLE PLAX 2D LVIDd:         5.80 cm      Diastology LVIDs:         4.50 cm      LV e' medial:    9.79 cm/s LV PW:         1.10 cm      LV E/e' medial:  12.6 LV IVS:        1.10 cm      LV e' lateral:   11.70 cm/s LVOT diam:     2.10 cm      LV E/e' lateral: 10.5 LVOT Area:     3.46 cm  LV Volumes (MOD) LV vol d, MOD A2C: 123.0 ml LV vol d, MOD A4C: 134.0 ml LV vol s, MOD A2C: 61.3 ml LV vol s, MOD A4C: 75.1 ml LV SV MOD A2C:     61.7 ml LV SV MOD A4C:     134.0 ml LV SV MOD BP:      60.2 ml RIGHT VENTRICLE RV Basal diam:  6.60 cm RV Mid diam:    5.60 cm RV S prime:     12.20 cm/s TAPSE (M-mode): 2.5 cm LEFT ATRIUM              Index        RIGHT ATRIUM           Index LA diam:        4.10 cm 1.91 cm/m   RA Area:     25.30 cm LA Vol (A2C):   53.6 ml 24.91 ml/m  RA Volume:   91.40 ml  42.48 ml/m LA Vol (A4C):   54.3 ml 25.24 ml/m LA Biplane Vol: 56.5 ml 26.26 ml/m  MITRAL VALVE                TRICUSPID VALVE MV Area (  PHT): 3.42 cm     TR Peak grad:   28.9 mmHg MV Decel Time: 222 msec     TR Vmax:        269.00 cm/s MV E velocity: 123.00 cm/s MV A velocity: 135.00 cm/s  SHUNTS MV E/A ratio:  0.91         Systemic Diam: 2.10 cm Carolan Clines Electronically signed by Carolan Clines Signature Date/Time: 01/24/2023/10:37:05 AM    Final    US RENAL  Result Date: 01/24/2023 CLINICAL DATA:  Hematuria EXAM: RENAL / URINARY TRACT ULTRASOUND COMPLETE COMPARISON:  CT 10/26/2022 FINDINGS: Right Kidney: Renal measurements: 12.1 x 6.5 x 6.8 cm = volume: 281 mL. Normal cortical thickness and echogenicity. Suboptimally visualized due to shadowing related to body habitus. No gross evidence of mass or hydronephrosis. Left Kidney: Renal measurements: 11.9 x 6.9 x 6.5 cm = volume: 281 mL. Normal cortical thickness and echogenicity. Suboptimally visualized due to shadowing from body habitus. No mass or hydronephrosis. Bladder: Decompressed by Foley catheter, inadequately assessed Other: No gross shadowing calculi are seen at either kidney though assessment is limited. IMPRESSION: No gross evidence of renal mass or hydronephrosis. Electronically Signed   By: Ulyses Southward M.D.   On: 01/24/2023 10:03   DG CHEST PORT 1 VIEW  Result Date: 01/24/2023 CLINICAL DATA:  Intubation EXAM: PORTABLE CHEST 1 VIEW COMPARISON:  Portable exam 0759 hours compared to 01/24/2023 at 0731 hours FINDINGS: Tip of endotracheal tube now projects 5.8 cm above carina. Nasogastric tube extends into stomach. Enlargement of cardiac silhouette with pulmonary vascular congestion. Perihilar infiltrates favoring pulmonary edema. Small bibasilar effusions. No pneumothorax.  IMPRESSION: CHF with small bibasilar effusions. Tip of endotracheal tube now projects 5.8 cm above carina. Electronically Signed   By: Ulyses Southward M.D.   On: 01/24/2023 08:24   DG CHEST PORT 1 VIEW  Result Date: 01/24/2023 CLINICAL DATA:  Status post intubation. EXAM: PORTABLE CHEST 1 VIEW COMPARISON:  Earlier today FINDINGS: The ET tube tip is approximately 1.5 cm above the carina directed towards the right mainstem bronchus. Cardiac enlargement. Bilateral pleural effusions and interstitial edema appears similar to the previous exam. Visualized osseous structures are unremarkable. IMPRESSION: The ET tube tip is approximately 1.5 cm above the carina directed towards the right mainstem bronchus. Recommend retracting approximately 1-2 cm. No change in CHF pattern. These results will be called to the ordering clinician or representative by the Radiologist Assistant, and communication documented in the PACS or [employer]. Electronically Signed   By: Signa Kell M.D.   On: 01/24/2023 07:43   DG CHEST PORT 1 VIEW  Result Date: 01/24/2023 CLINICAL DATA:  Respiratory distress EXAM: PORTABLE CHEST 1 VIEW COMPARISON:  01/23/2023 FINDINGS: Cardiomegaly with vascular congestion. Bilateral airspace opacities most compatible with moderate edema. Probable layering bilateral effusions. No acute bony abnormality. IMPRESSION: Moderate CHF Electronically Signed   By: Charlett Nose M.D.   On: 01/24/2023 01:40   CT ANGIO ABD/PELVIS BRTO  Result Date: 01/23/2023 CLINICAL DATA:  Gastric varices severe anemia, gastric and duodenal varices, may need embolization EXAM: CTA ABDOMEN AND PELVIS WITHOUT AND WITH CONTRAST TECHNIQUE: Multidetector CT imaging of the abdomen and pelvis was performed using the standard protocol during bolus administration of intravenous contrast. Multiplanar reconstructed images and MIPs were obtained and reviewed to evaluate the vascular anatomy. RADIATION DOSE REDUCTION: This exam was performed  according to the departmental dose-optimization program which includes automated exposure control, adjustment of the mA and/or kV according to patient size and/or use  of iterative reconstruction technique. CONTRAST:  OMNIPAQUE IOHEXOL 350 MG/ML SOLN COMPARISON:  10/26/2022 FINDINGS: VASCULAR Aorta: Normal caliber aorta without aneurysm, dissection, vasculitis or significant stenosis. Celiac: Patent without evidence of aneurysm, dissection, vasculitis or significant stenosis. SMA: Patent without evidence of aneurysm, dissection, vasculitis or significant stenosis. Renals: Both renal arteries are patent without evidence of aneurysm, dissection, vasculitis, fibromuscular dysplasia or significant stenosis. IMA: Patent without evidence of aneurysm, dissection, vasculitis or significant stenosis. Inflow: Patent without evidence of aneurysm, dissection, vasculitis or significant stenosis. Proximal Outflow: Bilateral common femoral and visualized portions of the superficial and profunda femoral arteries are patent without evidence of aneurysm, dissection, vasculitis or significant stenosis. Veins: Patent hepatic veins, portal vein, SM V. There is a large venous collateral from the SMV to the left retroperitoneum ultimately draining into the left renal vein. There is a large tortuous left splenorenal shunt, with small gastric varices. Mild esophageal varices near the GE junction. Iliac venous system patent. 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 Review of the MIP images confirms the above findings. NON-VASCULAR Lower chest: Small pleural effusions right greater than left. Dependent atelectasis/consolidation in the lower lobes worse right than left. Hepatobiliary: Short-term liver with nodular contour. No mass or biliary ductal dilatation. Gallbladder physiologically distended with multiple small partially calcified layering stones in its dependent aspect. Pancreas: Unremarkable. No pancreatic ductal  dilatation or surrounding inflammatory changes. Spleen: Borderline splenomegaly 12.4 cm maximum length. No focal lesion. Patent splenic vein with large left retroperitoneal splenorenal shunt. Adrenals/Urinary Tract: No adrenal mass. Symmetric renal parenchymal enhancement. No urolithiasis or hydronephrosis. Urinary bladder incompletely distended. Stomach/Bowel: Stomach is partially distended, unremarkable. Small bowel decompressed. The colon is incompletely distended by gas and fecal material, unremarkable. Lymphatic: No adenopathy. Reproductive: Prostate is unremarkable. Other: Left pelvic phleboliths.  No ascites.  No free air. Musculoskeletal: Body wall anasarca.  Regional bones unremarkable. IMPRESSION: 1. Cirrhotic liver with borderline splenomegaly, large portosystemic venous collaterals, small gastric and esophageal varices. 2. No ascites. 3. Cholelithiasis. 4. Small bilateral pleural effusions right greater than left. Electronically Signed   By: Corlis Leak M.D.   On: 01/23/2023 12:50

## 2023-01-25 NOTE — Progress Notes (Signed)
Hypoglycemic Event  CBG: 76  Treatment: D50 25 mL (12.5 gm)  Symptoms: None  Follow-up CBG: Time:0015 CBG Result:112  Possible Reasons for Event: Unknown  Comments/MD notified:N    Justus Memory

## 2023-01-25 NOTE — Plan of Care (Signed)
  Problem: Education: Goal: Knowledge of General Education information will improve Description: Including pain rating scale, medication(s)/side effects and non-pharmacologic comfort measures Outcome: Progressing   Problem: Health Behavior/Discharge Planning: Goal: Ability to manage health-related needs will improve Outcome: Progressing   Problem: Clinical Measurements: Goal: Ability to maintain clinical measurements within normal limits will improve Outcome: Progressing Goal: Will remain free from infection Outcome: Progressing Goal: Diagnostic test results will improve Outcome: Progressing Goal: Respiratory complications will improve Outcome: Progressing Goal: Cardiovascular complication will be avoided Outcome: Progressing   Problem: Activity: Goal: Risk for activity intolerance will decrease Outcome: Progressing   Problem: Nutrition: Goal: Adequate nutrition will be maintained Outcome: Progressing   Problem: Coping: Goal: Level of anxiety will decrease Outcome: Progressing   Problem: Elimination: Goal: Will not experience complications related to bowel motility Outcome: Progressing Goal: Will not experience complications related to urinary retention Outcome: Progressing   Problem: Pain Managment: Goal: General experience of comfort will improve Outcome: Progressing   Problem: Safety: Goal: Ability to remain free from injury will improve Outcome: Progressing   Problem: Skin Integrity: Goal: Risk for impaired skin integrity will decrease Outcome: Progressing   Problem: Safety: Goal: Non-violent Restraint(s) Outcome: Progressing   Problem: Physical Regulation: Goal: Complications related to the disease process, condition or treatment will be avoided or minimized Outcome: Progressing   Problem: Safety: Goal: Ability to remain free from injury will improve Outcome: Progressing   Problem: Education: Goal: Ability to identify signs and symptoms of  gastrointestinal bleeding will improve Outcome: Progressing   Problem: Bowel/Gastric: Goal: Will show no signs and symptoms of gastrointestinal bleeding Outcome: Progressing   Problem: Fluid Volume: Goal: Will show no signs and symptoms of excessive bleeding Outcome: Progressing   Problem: Clinical Measurements: Goal: Complications related to the disease process, condition or treatment will be avoided or minimized Outcome: Progressing   

## 2023-01-26 ENCOUNTER — Inpatient Hospital Stay (HOSPITAL_COMMUNITY): Payer: Medicaid Other

## 2023-01-26 DIAGNOSIS — Z66 Do not resuscitate: Secondary | ICD-10-CM | POA: Diagnosis not present

## 2023-01-26 DIAGNOSIS — A4101 Sepsis due to Methicillin susceptible Staphylococcus aureus: Secondary | ICD-10-CM | POA: Diagnosis not present

## 2023-01-26 DIAGNOSIS — N179 Acute kidney failure, unspecified: Secondary | ICD-10-CM | POA: Diagnosis not present

## 2023-01-26 DIAGNOSIS — Z1152 Encounter for screening for COVID-19: Secondary | ICD-10-CM | POA: Diagnosis not present

## 2023-01-26 DIAGNOSIS — J9600 Acute respiratory failure, unspecified whether with hypoxia or hypercapnia: Secondary | ICD-10-CM

## 2023-01-26 DIAGNOSIS — Z515 Encounter for palliative care: Secondary | ICD-10-CM | POA: Diagnosis not present

## 2023-01-26 LAB — TYPE AND SCREEN
ABO/RH(D): O POS
Unit division: 0
Unit division: 0
Unit division: 0
Unit division: 0

## 2023-01-26 LAB — COMPREHENSIVE METABOLIC PANEL
ALT: 16 U/L (ref 0–44)
AST: 43 U/L — ABNORMAL HIGH (ref 15–41)
Albumin: 2.2 g/dL — ABNORMAL LOW (ref 3.5–5.0)
Alkaline Phosphatase: 53 U/L (ref 38–126)
Anion gap: 13 (ref 5–15)
BUN: 25 mg/dL — ABNORMAL HIGH (ref 6–20)
CO2: 19 mmol/L — ABNORMAL LOW (ref 22–32)
Calcium: 7.8 mg/dL — ABNORMAL LOW (ref 8.9–10.3)
Chloride: 97 mmol/L — ABNORMAL LOW (ref 98–111)
Creatinine, Ser: 2.86 mg/dL — ABNORMAL HIGH (ref 0.61–1.24)
GFR, Estimated: 28 mL/min — ABNORMAL LOW (ref >60)
Glucose, Bld: 133 mg/dL — ABNORMAL HIGH (ref 70–99)
Potassium: 4.1 mmol/L (ref 3.5–5.1)
Sodium: 129 mmol/L — ABNORMAL LOW (ref 135–145)
Total Bilirubin: 14.6 mg/dL — ABNORMAL HIGH (ref 0.3–1.2)
Total Protein: 7.4 g/dL (ref 6.5–8.1)

## 2023-01-26 LAB — BPAM RBC
Blood Product Expiration Date: 202405222359
Blood Product Expiration Date: 202406012359
ISSUE DATE / TIME: 202404251420
ISSUE DATE / TIME: 202404260153
Unit Type and Rh: 5100
Unit Type and Rh: 5100

## 2023-01-26 LAB — CBC
HCT: 21.7 % — ABNORMAL LOW (ref 39.0–52.0)
Hemoglobin: 6.8 g/dL — CL (ref 13.0–17.0)
MCH: 26.4 pg (ref 26.0–34.0)
MCHC: 31.3 g/dL (ref 30.0–36.0)
MCV: 84.1 fL (ref 80.0–100.0)
Platelets: 166 10*3/uL (ref 150–400)
RBC: 2.58 MIL/uL — ABNORMAL LOW (ref 4.22–5.81)
RDW: 19.6 % — ABNORMAL HIGH (ref 11.5–15.5)
WBC: 6 10*3/uL (ref 4.0–10.5)
nRBC: 6.2 % — ABNORMAL HIGH (ref 0.0–0.2)

## 2023-01-26 LAB — GLUCOSE, CAPILLARY
Glucose-Capillary: 107 mg/dL — ABNORMAL HIGH (ref 70–99)
Glucose-Capillary: 111 mg/dL — ABNORMAL HIGH (ref 70–99)
Glucose-Capillary: 118 mg/dL — ABNORMAL HIGH (ref 70–99)
Glucose-Capillary: 118 mg/dL — ABNORMAL HIGH (ref 70–99)
Glucose-Capillary: 119 mg/dL — ABNORMAL HIGH (ref 70–99)
Glucose-Capillary: 120 mg/dL — ABNORMAL HIGH (ref 70–99)
Glucose-Capillary: 130 mg/dL — ABNORMAL HIGH (ref 70–99)

## 2023-01-26 LAB — MAGNESIUM: Magnesium: 2.2 mg/dL (ref 1.7–2.4)

## 2023-01-26 LAB — CULTURE, RESPIRATORY W GRAM STAIN: Culture: NORMAL

## 2023-01-26 LAB — PHOSPHORUS
Phosphorus: 5.1 mg/dL — ABNORMAL HIGH (ref 2.5–4.6)
Phosphorus: 5.1 mg/dL — ABNORMAL HIGH (ref 2.5–4.6)

## 2023-01-26 LAB — CK TOTAL AND CKMB (NOT AT ARMC)
CK, MB: 0.6 ng/mL (ref 0.5–5.0)
Total CK: 44 U/L — ABNORMAL LOW (ref 49–397)

## 2023-01-26 LAB — PREPARE FRESH FROZEN PLASMA

## 2023-01-26 LAB — PREPARE RBC (CROSSMATCH)

## 2023-01-26 LAB — SODIUM, URINE, RANDOM: Sodium, Ur: 45 mmol/L

## 2023-01-26 LAB — PROTIME-INR
INR: 2.9 — ABNORMAL HIGH (ref 0.8–1.2)
Prothrombin Time: 30 seconds — ABNORMAL HIGH (ref 11.4–15.2)

## 2023-01-26 LAB — PROCALCITONIN: Procalcitonin: 0.94 ng/mL

## 2023-01-26 LAB — AMMONIA: Ammonia: 57 umol/L — ABNORMAL HIGH (ref 9–35)

## 2023-01-26 LAB — LACTIC ACID, PLASMA: Lactic Acid, Venous: 1.2 mmol/L (ref 0.5–1.9)

## 2023-01-26 MED ORDER — SODIUM CHLORIDE 0.9% IV SOLUTION: Freq: Once | INTRAVENOUS | Status: AC

## 2023-01-26 MED ORDER — VITAMIN K1 10 MG/ML IJ SOLN
10.0000 mg | Freq: Once | INTRAVENOUS | Status: AC
  Administered 2023-01-27: 10 mg via INTRAVENOUS
  Filled 2023-01-26: qty 1

## 2023-01-26 MED ORDER — MIDODRINE HCL 5 MG PO TABS: 10.0000 mg | ORAL_TABLET | Freq: Three times a day (TID) | ORAL | Status: DC

## 2023-01-26 MED ORDER — RIFAXIMIN 550 MG PO TABS
550.0000 mg | ORAL_TABLET | Freq: Two times a day (BID) | ORAL | Status: DC
  Administered 2023-01-26 (×2): 550 mg via ORAL
  Filled 2023-01-26 (×2): qty 1

## 2023-01-26 MED ORDER — VITAL 1.5 CAL PO LIQD
1000.0000 mL | ORAL | Status: DC
  Administered 2023-01-26 – 2023-01-30 (×5): 1000 mL
  Filled 2023-01-26 (×16): qty 1000

## 2023-01-26 MED ORDER — MIDODRINE HCL 5 MG PO TABS
10.0000 mg | ORAL_TABLET | Freq: Three times a day (TID) | ORAL | Status: DC
  Administered 2023-01-26 (×3): 10 mg via ORAL
  Filled 2023-01-26 (×3): qty 2

## 2023-01-26 MED ORDER — PROSOURCE TF20 ENFIT COMPATIBL EN LIQD
60.0000 mL | Freq: Two times a day (BID) | ENTERAL | Status: DC
  Administered 2023-01-26 – 2023-01-28 (×5): 60 mL
  Filled 2023-01-26 (×5): qty 60

## 2023-01-26 MED ORDER — SODIUM CHLORIDE 0.9 % IV SOLN
250.0000 mL | INTRAVENOUS | Status: DC
  Administered 2023-01-26 – 2023-01-28 (×2): 250 mL via INTRAVENOUS

## 2023-01-26 MED ORDER — SODIUM CHLORIDE 0.9 % IV SOLN: 50.0000 ug/h | INTRAVENOUS | Status: DC

## 2023-01-26 MED ORDER — VITAMIN K1 10 MG/ML IJ SOLN
10.0000 mg | Freq: Once | INTRAVENOUS | Status: AC
  Administered 2023-01-26: 10 mg via INTRAVENOUS
  Filled 2023-01-26: qty 1

## 2023-01-26 MED ORDER — NOREPINEPHRINE 4 MG/250ML-% IV SOLN
2.0000 ug/min | INTRAVENOUS | Status: DC
  Administered 2023-01-26: 2 ug/min via INTRAVENOUS
  Administered 2023-01-27: 3 ug/min via INTRAVENOUS
  Filled 2023-01-26 (×2): qty 250

## 2023-01-26 MED ORDER — ALBUMIN HUMAN 25 % IV SOLN
25.0000 g | Freq: Four times a day (QID) | INTRAVENOUS | Status: AC
  Administered 2023-01-26 – 2023-01-28 (×8): 25 g via INTRAVENOUS
  Filled 2023-01-26 (×7): qty 100

## 2023-01-26 MED ORDER — ALBUMIN HUMAN 25 % IV SOLN
12.5000 g | Freq: Three times a day (TID) | INTRAVENOUS | Status: DC
  Administered 2023-01-26 (×3): 12.5 g via INTRAVENOUS
  Filled 2023-01-26 (×4): qty 50

## 2023-01-26 NOTE — Progress Notes (Signed)
eLink Physician-Brief Progress Note Patient Name: [name] DOB: [birth year] MRN: [id number]   Date of Service  01/26/2023  HPI/Events of Note  37 year old male admitted with severe weakness and found to have anemia with a hemoglobin of 3.  Currently intubated and on invasive mechanical ventilation.  Borderline hypotensive overnight.  Bedside RN reports poor urine output (10 cc overnight) electrolyte imbalances and worsening renal failure.  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 Hemoglobin is also trending downwards with a level of 6.8 this morning.  eICU Interventions  Transfuse 1 unit of packed red cells. Continue octreotide infusion. Start midodrine and scheduled albumin for possible hepatorenal syndrome. He might need hemodialysis in the not-too-distant future.     Intervention Category Major Interventions: Acute renal failure - evaluation and management;Hypotension - evaluation and management;Other:  Carilyn Goodpasture 01/26/2023, 5:12 AM

## 2023-01-26 NOTE — Progress Notes (Signed)
Pt switched back to assisted mode due to increased rr 40's

## 2023-01-26 NOTE — Progress Notes (Signed)
[facility] Gastroenterology Progress Note  Nalin Mazzocco 37 y.o. 02-Apr-1986  CC: Decompensated cirrhosis, severe anemia   Subjective: Patient seen and examined at bedside.  He remains intubated.  No evidence of overt bleeding .  drop in hemoglobin noted.  Family at bedside.  ROS : Unable to obtain   Objective: Vital signs in last 24 hours: Vitals:   01/26/23 0819 01/26/23 0900  BP:  (!) 101/43  Pulse:  80  Resp:  20  Temp:    SpO2: 96% 93%    Physical Exam: Sedated, intubated.  Abdomen is moderately distended, bowel sound present, no peritoneal signs.  Lab Results: Recent Labs    01/24/23 0549 01/24/23 1650 01/25/23 0727 01/26/23 0320  NA 129*   < > 130* 129*  K 3.5   < > 3.4* 4.1  CL 94*   < > 97* 97*  CO2 23   < > 22 19*  GLUCOSE 106*   < > 140* 133*  BUN 18   < > 23* 25*  CREATININE 1.50*   < > 2.16* 2.86*  CALCIUM 7.9*   < > 8.0* 7.8*  MG 1.8  --   --   --   PHOS 4.5  --   --   --    < > = values in this interval not displayed.   Recent Labs    01/25/23 0727 01/26/23 0320  AST 52* 43*  ALT 16 16  ALKPHOS 64 53  BILITOT 14.5* 14.6*  PROT 8.4* 7.4  ALBUMIN 2.3* 2.2*   Recent Labs    01/25/23 0727 01/26/23 0320  WBC 8.7 6.0  HGB 7.5* 6.8*  HCT 23.7* 21.7*  MCV 85.9 84.1  PLT 160 166   Recent Labs    01/25/23 0727 01/26/23 0320  LABPROT 26.9* 30.0*  INR 2.5* 2.9*      Assessment/Plan: -Severe anemia in setting of decompensated cirrhosis.  Hemoglobin of 3.2 on admission.  CT angio GI bleed protocol negative for any active bleeding.  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 -Abnormal LFTs with jaundice.  T. bili 14.6.  Likely from decompensated cirrhosis.  MELD score 39 today. -Acute kidney injury, ?  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 -Hematuria with reported 300 cc of  urinary output as blood and blood clots.  Seen by urology.  Hematuria likely attributed to coagulopathy and probably hyperbilirubinemia.  Recommended outpatient workup. -SVT during ED presentation.  S/p cardioversion.  Recommendations ------------------------- -Patient discussed with Dr. Marchelle Gearing in the ICU.  Patient with multiorgan failure.  Worsening INR.  Worsening kidney functions.  Not able to extubate.  Ongoing anemia requiring blood transfusion.  -Nephrology consult.  Appreciate their input.    -Has been started on midodrine, albumin increased to 25 g every 6 hours, continue octreotide. -On lactulose.  Add rifaximin. -Start vitamin K 10 mg daily -Also started on prednisolone for possible alcoholic hepatitis.  According to IR, not a candidate for TIPS because of advanced MELD score and unusual vascular anatomy.  -Poor prognosis because of multiorgan failure and advanced MELD score discussed with the family at bedside.  May need palliative care consult   Kathi Der MD, FACP 01/26/2023, 9:53 AM  Contact #  [phone]

## 2023-01-26 NOTE — Progress Notes (Signed)
Patient has orders for Midodrine 10 mg every 8 hour for his low blood pressure, which was given, also albumin 12.5 g every 8 hour also given, his octreotide was discontinued, and one unit of packed rd blood cells to be given, which was started at 0629, unit number is [id number], BBN is [id number], Education was provided to the patient, he was able to nod that he understood. Vital in chart, no adverse reactions noted, rate increase from 120 to 150.

## 2023-01-26 NOTE — Progress Notes (Signed)
Pt placed on CPAP 5/+5/35% for wean 0815, pt tolerating well at this time, VT 326, rr 30, PIP 11, MAP 6, Ve 11.0, sats 92%, hr 84, RT will continue to monitor prn

## 2023-01-26 NOTE — Consult Note (Signed)
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 male with obesity and cirrhosis due to ETOH-  had been sober but had relapse 2 days PTA.  He presented to the ER with c/o weakness and tachycardia found to have a hgb in the 3's and crt 1.6 ( where it had been 0.5) .  Was admitted-  given blood- GI saw and put on octreotide and PPI.  Hospitalization complicated by resp failure req intubation.  Found to have a MELD score of over 39 so not a candidate for TIPS- had gross hematuria with INR over 2-  renal US showed 12 cm kidneys that looked normal-  urine without protein but did have microscopic hematuria.  BP has been soft-  UOP dropping.  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  Pt currently sedated on vent-  family at bedside-   very positive by I's and O's and as above is oliguric  Creatinine  Date/Time Value Ref Range Status  01/21/2022 10:00 AM 0.75 0.61 - 1.24 mg/dL Final   Creatinine, Ser  Date/Time Value Ref Range Status  01/26/2023 03:20 AM 2.86 (H) 0.61 - 1.24 mg/dL Final  96/12/5407 81:19 AM 2.16 (H) 0.61 - 1.24 mg/dL Final  14/78/2956 21:30 PM 1.68 (H) 0.61 - 1.24 mg/dL Final  86/57/8469 62:95 AM 1.50 (H) 0.61 - 1.24 mg/dL Final  28/41/3244 01:02 AM 1.80 (H) 0.61 - 1.24 mg/dL Final  72/53/6644 03:47 AM 1.60 (H) 0.61 - 1.24 mg/dL Final  42/59/5638 75:64 AM 0.52 (L) 0.61 - 1.24 mg/dL Final  33/29/5188 41:66 AM 0.50 (L) 0.61 - 1.24 mg/dL Final  03/29/1600 09:32 AM 0.70 0.61 - 1.24 mg/dL Final  35/57/3220 25:42 AM 0.82 0.61 - 1.24 mg/dL Final  70/62/3762 83:15 AM 0.69 0.61 - 1.24 mg/dL Final  17/61/6073 71:06 AM 0.67 0.61 - 1.24 mg/dL Final  26/94/8546 27:03 AM 0.58 (L) 0.61 - 1.24 mg/dL Final  50/05/3817 29:93 AM 0.59 (L) 0.61 - 1.24 mg/dL Final  71/69/6789 38:10 PM 1.18 0.61 - 1.24 mg/dL  Final  17/51/0258 52:77 AM 0.55 (L) 0.61 - 1.24 mg/dL Final  82/42/3536 14:43 AM 0.62 0.61 - 1.24 mg/dL Final  15/40/0867 61:95 AM 0.63 0.61 - 1.24 mg/dL Final  09/32/6712 45:80 AM 0.77 0.61 - 1.24 mg/dL Final  99/83/3825 05:39 AM 0.61 0.61 - 1.24 mg/dL Final  76/73/4193 79:02 AM 0.71 0.61 - 1.24 mg/dL Final  40/97/3532 99:24 AM 0.84 0.61 - 1.24 mg/dL Final  26/83/4196 22:29 AM 0.85 0.61 - 1.24 mg/dL Final  79/89/2119 41:74 AM 0.71 0.61 - 1.24 mg/dL Final  05/13/4817 56:31 AM 0.71 0.61 - 1.24 mg/dL Final     PMHx:   Past Medical History:  Diagnosis Date   Liver cirrhosis, alcoholic (HCC)    Portal hypertension (HCC)    Splenomegaly, congestive, chronic     Past Surgical History:  Procedure Laterality Date   COSMETIC SURGERY     ESOPHAGOGASTRODUODENOSCOPY N/A 04/26/2021   Procedure: ESOPHAGOGASTRODUODENOSCOPY (EGD);  Surgeon: Willis Modena, MD;  Location: [facility];  Service: Endoscopy;  Laterality: N/A;   ESOPHAGOGASTRODUODENOSCOPY (EGD) WITH PROPOFOL N/A 11/01/2022   Procedure: ESOPHAGOGASTRODUODENOSCOPY (EGD) WITH PROPOFOL;  Surgeon: Vida Rigger, MD;  Location: [facility];  Service: Gastroenterology;  Laterality: N/A;   MANDIBLE FRACTURE SURGERY      Family Hx: History reviewed. No pertinent  family history.  Social History:  reports that he has never smoked. He has never used smokeless tobacco. He reports current alcohol use. He reports that he does not use drugs.  Allergies: No Known Allergies  Medications: Prior to Admission medications   Medication Sig Start Date End Date Taking? Authorizing Provider  furosemide (LASIX) 40 MG tablet Take 1 tablet (40 mg total) by mouth daily. 11/02/22  Yes Narda Bonds, MD  spironolactone (ALDACTONE) 50 MG tablet Take 1 tablet (50 mg total) by mouth daily. 11/02/22 01/31/23 Yes Narda Bonds, MD  ferrous sulfate 325 (65 FE) MG tablet Take 1 tablet (325 mg total) by mouth daily with breakfast. Patient not taking: Reported on  10/25/2022 07/30/22 10/25/22  Pollyann Savoy, MD  lactulose (CHRONULAC) 10 GM/15ML solution Take 45 mLs (30 g total) by mouth 3 (three) times daily. Patient not taking: Reported on 01/23/2023 11/02/22 01/31/23  Narda Bonds, MD  pantoprazole (PROTONIX) 40 MG tablet Take 1 tablet (40 mg total) by mouth 2 (two) times daily. Patient not taking: Reported on 01/23/2023 11/02/22 01/31/23  Narda Bonds, MD  propranolol (INDERAL) 10 MG tablet Take 1 tablet (10 mg total) by mouth 2 (two) times daily. Patient not taking: Reported on 01/23/2023 11/02/22 01/31/23  Narda Bonds, MD    I have reviewed the patient's current medications.  Labs:  Results for orders placed or performed during the hospital encounter of 01/23/23 (from the past 48 hour(s))  CBC     Status: Abnormal   Collection Time: 01/24/23 11:18 AM  Result Value Ref Range   WBC 6.8 4.0 - 10.5 K/uL   RBC 2.56 (L) 4.22 - 5.81 MIL/uL   Hemoglobin 6.7 (LL) 13.0 - 17.0 g/dL    Comment: REPEATED TO VERIFY DELTA CHECK NOTED CRITICAL VALUE NOTED.  VALUE IS CONSISTENT WITH PREVIOUSLY REPORTED AND CALLED VALUE.    HCT 21.7 (L) 39.0 - 52.0 %   MCV 84.8 80.0 - 100.0 fL   MCH 26.2 26.0 - 34.0 pg   MCHC 30.9 30.0 - 36.0 g/dL   RDW 14.7 (H) 82.9 - 56.2 %   Platelets 160 150 - 400 K/uL   nRBC 7.1 (H) 0.0 - 0.2 %    Comment: Performed at [facility], [address] [place], [place] [address]  Procalcitonin     Status: None   Collection Time: 01/24/23 11:18 AM  Result Value Ref Range   Procalcitonin 0.29 ng/mL    Comment:        Interpretation: PCT (Procalcitonin) <= 0.5 ng/mL: Systemic infection (sepsis) is not likely. Local bacterial infection is possible. (NOTE)       Sepsis PCT Algorithm           Lower Respiratory Tract                                      Infection PCT Algorithm    ----------------------------     ----------------------------         PCT < 0.25 ng/mL                PCT < 0.10 ng/mL          Strongly  encourage             Strongly discourage   discontinuation of antibiotics    initiation of antibiotics    ----------------------------     -----------------------------  PCT 0.25 - 0.50 ng/mL            PCT 0.10 - 0.25 ng/mL               OR       >80% decrease in PCT            Discourage initiation of                                            antibiotics      Encourage discontinuation           of antibiotics    ----------------------------     -----------------------------         PCT >= 0.50 ng/mL              PCT 0.26 - 0.50 ng/mL               AND        <80% decrease in PCT             Encourage initiation of                                             antibiotics       Encourage continuation           of antibiotics    ----------------------------     -----------------------------        PCT >= 0.50 ng/mL                  PCT > 0.50 ng/mL               AND         increase in PCT                  Strongly encourage                                      initiation of antibiotics    Strongly encourage escalation           of antibiotics                                     -----------------------------                                           PCT <= 0.25 ng/mL                                                 OR                                        > 80% decrease in PCT  Discontinue / Do not initiate                                             antibiotics  Performed at [facility], [address] [place], [place] [address]   Glucose, capillary     Status: Abnormal   Collection Time: 01/24/23 11:52 AM  Result Value Ref Range   Glucose-Capillary 103 (H) 70 - 99 mg/dL    Comment: Glucose reference range applies only to samples taken after fasting for at least 8 hours.   Comment 1 Notify RN    Comment 2 Document in Chart   Prepare RBC (crossmatch)     Status: None   Collection Time: 01/24/23 12:18 PM  Result  Value Ref Range   Order Confirmation      ORDER PROCESSED BY BLOOD BANK Performed at [facility], [address], [place], [place] [address]   Glucose, capillary     Status: None   Collection Time: 01/24/23  3:16 PM  Result Value Ref Range   Glucose-Capillary 92 70 - 99 mg/dL    Comment: Glucose reference range applies only to samples taken after fasting for at least 8 hours.   Comment 1 Notify RN    Comment 2 Document in Chart   Comprehensive metabolic panel     Status: Abnormal   Collection Time: 01/24/23  4:50 PM  Result Value Ref Range   Sodium 130 (L) 135 - 145 mmol/L   Potassium 3.4 (L) 3.5 - 5.1 mmol/L   Chloride 97 (L) 98 - 111 mmol/L   CO2 22 22 - 32 mmol/L   Glucose, Bld 96 70 - 99 mg/dL    Comment: Glucose reference range applies only to samples taken after fasting for at least 8 hours.   BUN 19 6 - 20 mg/dL   Creatinine, Ser 0.98 (H) 0.61 - 1.24 mg/dL   Calcium 7.7 (L) 8.9 - 10.3 mg/dL   Total Protein 8.1 6.5 - 8.1 g/dL   Albumin 2.3 (L) 3.5 - 5.0 g/dL   AST 43 (H) 15 - 41 U/L   ALT 17 0 - 44 U/L   Alkaline Phosphatase 66 38 - 126 U/L   Total Bilirubin 14.4 (H) 0.3 - 1.2 mg/dL   GFR, Estimated 53 (L) >60 mL/min    Comment: (NOTE) Calculated using the CKD-EPI Creatinine Equation (2021)    Anion gap 11 5 - 15    Comment: Performed at [facility], [address] [place], [place] [address]  CBC     Status: Abnormal   Collection Time: 01/24/23  4:50 PM  Result Value Ref Range   WBC 6.6 4.0 - 10.5 K/uL   RBC 2.65 (L) 4.22 - 5.81 MIL/uL   Hemoglobin 7.0 (L) 13.0 - 17.0 g/dL   HCT 78.2 (L) 95.6 - 21.3 %   MCV 83.8 80.0 - 100.0 fL   MCH 26.4 26.0 - 34.0 pg   MCHC 31.5 30.0 - 36.0 g/dL   RDW 08.6 (H) 57.8 - 46.9 %   Platelets 151 150 - 400 K/uL   nRBC 6.3 (H) 0.0 - 0.2 %    Comment: Performed at [facility], [address], [place], [place] [address]  Protime-INR     Status: Abnormal   Collection Time:  01/24/23  6:44 PM  Result Value  Ref Range   Prothrombin Time 25.2 (H) 11.4 - 15.2 seconds   INR 2.3 (H) 0.8 - 1.2    Comment: (NOTE) INR goal varies based on device and disease states. Performed at [facility], [address] [place], [place] [address]   Glucose, capillary     Status: None   Collection Time: 01/24/23  7:54 PM  Result Value Ref Range   Glucose-Capillary 86 70 - 99 mg/dL    Comment: Glucose reference range applies only to samples taken after fasting for at least 8 hours.   Comment 1 Notify RN    Comment 2 Document in Chart   Glucose, capillary     Status: None   Collection Time: 01/24/23 11:54 PM  Result Value Ref Range   Glucose-Capillary 76 70 - 99 mg/dL    Comment: Glucose reference range applies only to samples taken after fasting for at least 8 hours.   Comment 1 Notify RN    Comment 2 Document in Chart   Glucose, capillary     Status: Abnormal   Collection Time: 01/25/23 12:33 AM  Result Value Ref Range   Glucose-Capillary 112 (H) 70 - 99 mg/dL    Comment: Glucose reference range applies only to samples taken after fasting for at least 8 hours.   Comment 1 Notify RN    Comment 2 Document in Chart   Glucose, capillary     Status: None   Collection Time: 01/25/23  1:06 AM  Result Value Ref Range   Glucose-Capillary 96 70 - 99 mg/dL    Comment: Glucose reference range applies only to samples taken after fasting for at least 8 hours.   Comment 1 Notify RN    Comment 2 Document in Chart   Glucose, capillary     Status: None   Collection Time: 01/25/23  4:03 AM  Result Value Ref Range   Glucose-Capillary 90 70 - 99 mg/dL    Comment: Glucose reference range applies only to samples taken after fasting for at least 8 hours.   Comment 1 Notify RN    Comment 2 Document in Chart   Triglycerides     Status: None   Collection Time: 01/25/23  7:27 AM  Result Value Ref Range   Triglycerides 102 <150 mg/dL    Comment: Performed at [facility], [address], [place], [place] [address]  Procalcitonin     Status: None   Collection Time: 01/25/23  7:27 AM  Result Value Ref Range   Procalcitonin 0.60 ng/mL    Comment:        Interpretation: PCT > 0.5 ng/mL and <= 2 ng/mL: Systemic infection (sepsis) is possible, but other conditions are known to elevate PCT as well. (NOTE)       Sepsis PCT Algorithm           Lower Respiratory Tract                                      Infection PCT Algorithm    ----------------------------     ----------------------------         PCT < 0.25 ng/mL                PCT < 0.10 ng/mL          Strongly encourage             Strongly  discourage   discontinuation of antibiotics    initiation of antibiotics    ----------------------------     -----------------------------       PCT 0.25 - 0.50 ng/mL            PCT 0.10 - 0.25 ng/mL               OR       >80% decrease in PCT            Discourage initiation of                                            antibiotics      Encourage discontinuation           of antibiotics    ----------------------------     -----------------------------         PCT >= 0.50 ng/mL              PCT 0.26 - 0.50 ng/mL                AND       <80% decrease in PCT             Encourage initiation of                                             antibiotics       Encourage continuation           of antibiotics    ----------------------------     -----------------------------        PCT >= 0.50 ng/mL                  PCT > 0.50 ng/mL               AND         increase in PCT                  Strongly encourage                                      initiation of antibiotics    Strongly encourage escalation           of antibiotics                                     -----------------------------                                           PCT <= 0.25 ng/mL                                                 OR                                        >  80%  decrease in PCT                                      Discontinue / Do not initiate                                             antibiotics  Performed at [facility], [address] [place], [place] [address]   Comprehensive metabolic panel     Status: Abnormal   Collection Time: 01/25/23  7:27 AM  Result Value Ref Range   Sodium 130 (L) 135 - 145 mmol/L   Potassium 3.4 (L) 3.5 - 5.1 mmol/L   Chloride 97 (L) 98 - 111 mmol/L   CO2 22 22 - 32 mmol/L   Glucose, Bld 140 (H) 70 - 99 mg/dL    Comment: Glucose reference range applies only to samples taken after fasting for at least 8 hours.   BUN 23 (H) 6 - 20 mg/dL   Creatinine, Ser 6.04 (H) 0.61 - 1.24 mg/dL   Calcium 8.0 (L) 8.9 - 10.3 mg/dL   Total Protein 8.4 (H) 6.5 - 8.1 g/dL   Albumin 2.3 (L) 3.5 - 5.0 g/dL   AST 52 (H) 15 - 41 U/L   ALT 16 0 - 44 U/L   Alkaline Phosphatase 64 38 - 126 U/L   Total Bilirubin 14.5 (H) 0.3 - 1.2 mg/dL   GFR, Estimated 39 (L) >60 mL/min    Comment: (NOTE) Calculated using the CKD-EPI Creatinine Equation (2021)    Anion gap 11 5 - 15    Comment: Performed at [facility], [address], [place], [place] [address]  Protime-INR     Status: Abnormal   Collection Time: 01/25/23  7:27 AM  Result Value Ref Range   Prothrombin Time 26.9 (H) 11.4 - 15.2 seconds   INR 2.5 (H) 0.8 - 1.2    Comment: (NOTE) INR goal varies based on device and disease states. Performed at [facility], [address], [place], [place] [address]   CBC     Status: Abnormal   Collection Time: 01/25/23  7:27 AM  Result Value Ref Range   WBC 8.7 4.0 - 10.5 K/uL   RBC 2.76 (L) 4.22 - 5.81 MIL/uL   Hemoglobin 7.5 (L) 13.0 - 17.0 g/dL   HCT 78.2 (L) 95.6 - 21.3 %   MCV 85.9 80.0 - 100.0 fL   MCH 27.2 26.0 - 34.0 pg   MCHC 31.6 30.0 - 36.0 g/dL   RDW 08.6 (H) 57.8 - 46.9 %   Platelets 160 150 - 400 K/uL   nRBC 6.2 (H) 0.0 - 0.2 %    Comment: Performed at [facility], [address], [place], [place] [address]  Glucose, capillary     Status: None   Collection Time: 01/25/23  8:04 AM  Result Value Ref Range   Glucose-Capillary 94 70 - 99 mg/dL    Comment: Glucose reference range applies only to samples taken after fasting for at least 8 hours.   Comment 1 Notify RN    Comment 2 Document in Chart   Glucose, capillary     Status: None   Collection Time: 01/25/23 12:02 PM  Result Value Ref Range  Glucose-Capillary 88 70 - 99 mg/dL    Comment: Glucose reference range applies only to samples taken after fasting for at least 8 hours.   Comment 1 Notify RN    Comment 2 Document in Chart   Glucose, capillary     Status: None   Collection Time: 01/25/23  3:27 PM  Result Value Ref Range   Glucose-Capillary 98 70 - 99 mg/dL    Comment: Glucose reference range applies only to samples taken after fasting for at least 8 hours.   Comment 1 Notify RN    Comment 2 Document in Chart   Glucose, capillary     Status: None   Collection Time: 01/25/23  8:08 PM  Result Value Ref Range   Glucose-Capillary 99 70 - 99 mg/dL    Comment: Glucose reference range applies only to samples taken after fasting for at least 8 hours.   Comment 1 Notify RN    Comment 2 Document in Chart   Glucose, capillary     Status: Abnormal   Collection Time: 01/25/23 11:09 PM  Result Value Ref Range   Glucose-Capillary 101 (H) 70 - 99 mg/dL    Comment: Glucose reference range applies only to samples taken after fasting for at least 8 hours.   Comment 1 Notify RN    Comment 2 Document in Chart   Procalcitonin     Status: None   Collection Time: 01/26/23  3:20 AM  Result Value Ref Range   Procalcitonin 0.94 ng/mL    Comment:        Interpretation: PCT > 0.5 ng/mL and <= 2 ng/mL: Systemic infection (sepsis) is possible, but other conditions are known to elevate PCT as well. (NOTE)       Sepsis PCT Algorithm           Lower Respiratory Tract                                       Infection PCT Algorithm    ----------------------------     ----------------------------         PCT < 0.25 ng/mL                PCT < 0.10 ng/mL          Strongly encourage             Strongly discourage   discontinuation of antibiotics    initiation of antibiotics    ----------------------------     -----------------------------       PCT 0.25 - 0.50 ng/mL            PCT 0.10 - 0.25 ng/mL               OR       >80% decrease in PCT            Discourage initiation of                                            antibiotics      Encourage discontinuation           of antibiotics    ----------------------------     -----------------------------         PCT >= 0.50 ng/mL  PCT 0.26 - 0.50 ng/mL                AND       <80% decrease in PCT             Encourage initiation of                                             antibiotics       Encourage continuation           of antibiotics    ----------------------------     -----------------------------        PCT >= 0.50 ng/mL                  PCT > 0.50 ng/mL               AND         increase in PCT                  Strongly encourage                                      initiation of antibiotics    Strongly encourage escalation           of antibiotics                                     -----------------------------                                           PCT <= 0.25 ng/mL                                                 OR                                        > 80% decrease in PCT                                      Discontinue / Do not initiate                                             antibiotics  Performed at [facility], [address] [place], [place] [address]   CK total and CKMB (cardiac)not at [facility]     Status: Abnormal   Collection Time: 01/26/23  3:20 AM  Result Value Ref Range   Total CK 44 (L) 49 - 397 U/L   CK, MB 0.6 0.5 - 5.0 ng/mL    Comment: ICTERUS AT  THIS LEVEL MAY AFFECT RESULT Performed at [facility], [address], [place], [place] [address]   Lactic acid, plasma     Status: None   Collection Time: 01/26/23  3:20 AM  Result Value Ref Range   Lactic Acid, Venous 1.2 0.5 - 1.9 mmol/L    Comment: Performed at [facility], [address] [place], [place] [address]  CBC     Status: Abnormal   Collection Time: 01/26/23  3:20 AM  Result Value Ref Range   WBC 6.0 4.0 - 10.5 K/uL   RBC 2.58 (L) 4.22 - 5.81 MIL/uL   Hemoglobin 6.8 (LL) 13.0 - 17.0 g/dL    Comment: REPEATED TO VERIFY THIS CRITICAL RESULT HAS VERIFIED AND BEEN CALLED TO [facility]S BY PATRICIA LUZOLO ON 04 28 2024 AT 0343, AND HAS BEEN READ BACK. CRITICAL RESULT VERIFIED    HCT 21.7 (L) 39.0 - 52.0 %   MCV 84.1 80.0 - 100.0 fL   MCH 26.4 26.0 - 34.0 pg   MCHC 31.3 30.0 - 36.0 g/dL   RDW 91.4 (H) 78.2 - 95.6 %   Platelets 166 150 - 400 K/uL   nRBC 6.2 (H) 0.0 - 0.2 %    Comment: Performed at [facility], [address], [place], [place] [address]  Protime-INR     Status: Abnormal   Collection Time: 01/26/23  3:20 AM  Result Value Ref Range   Prothrombin Time 30.0 (H) 11.4 - 15.2 seconds   INR 2.9 (H) 0.8 - 1.2    Comment: (NOTE) INR goal varies based on device and disease states. Performed at [facility], [address] [place], [place] [address]   Comprehensive metabolic panel     Status: Abnormal   Collection Time: 01/26/23  3:20 AM  Result Value Ref Range   Sodium 129 (L) 135 - 145 mmol/L   Potassium 4.1 3.5 - 5.1 mmol/L   Chloride 97 (L) 98 - 111 mmol/L   CO2 19 (L) 22 - 32 mmol/L   Glucose, Bld 133 (H) 70 - 99 mg/dL    Comment: Glucose reference range applies only to samples taken after fasting for at least 8 hours.   BUN 25 (H) 6 - 20 mg/dL   Creatinine, Ser 6.96 (H) 0.61 - 1.24 mg/dL   Calcium 7.8 (L) 8.9 - 10.3 mg/dL   Total Protein 7.4 6.5 - 8.1 g/dL   Albumin 2.2 (L) 3.5 - 5.0 g/dL    AST 43 (H) 15 - 41 U/L   ALT 16 0 - 44 U/L   Alkaline Phosphatase 53 38 - 126 U/L   Total Bilirubin 14.6 (H) 0.3 - 1.2 mg/dL   GFR, Estimated 28 (L) >60 mL/min    Comment: (NOTE) Calculated using the CKD-EPI Creatinine Equation (2021)    Anion gap 13 5 - 15    Comment: Performed at [facility], [address], [place], [place] [address]  Ammonia     Status: Abnormal   Collection Time: 01/26/23  3:20 AM  Result Value Ref Range   Ammonia 57 (H) 9 - 35 umol/L    Comment: Performed at [facility], [address], [place], [place] [address]  Glucose, capillary     Status: Abnormal   Collection Time: 01/26/23  3:34 AM  Result Value Ref Range   Glucose-Capillary 118 (H) 70 - 99 mg/dL    Comment: Glucose reference range applies only to samples taken after fasting for at least 8 hours.   Comment 1 Notify RN  Comment 2 Document in Chart   Glucose, capillary     Status: Abnormal   Collection Time: 01/26/23  4:18 AM  Result Value Ref Range   Glucose-Capillary 111 (H) 70 - 99 mg/dL    Comment: Glucose reference range applies only to samples taken after fasting for at least 8 hours.   Comment 1 Notify RN    Comment 2 Document in Chart   Prepare RBC (crossmatch)     Status: None   Collection Time: 01/26/23  4:54 AM  Result Value Ref Range   Order Confirmation      ORDER PROCESSED BY BLOOD BANK Performed at [facility], [address], [place], [place] [address]   Glucose, capillary     Status: Abnormal   Collection Time: 01/26/23  8:08 AM  Result Value Ref Range   Glucose-Capillary 107 (H) 70 - 99 mg/dL    Comment: Glucose reference range applies only to samples taken after fasting for at least 8 hours.   Comment 1 Notify RN      ROS:  Review of systems not obtained due to patient factors.  Physical Exam: Vitals:   01/26/23 0819 01/26/23 0900  BP:  (!) 101/43  Pulse:  80  Resp:  20  Temp:    SpO2: 96% 93%      General: obese BM-  sedated on the vent HEENT: PERRLA, EOMI, mucous membranes moist  Neck: unable to tell JVD Heart: RRR Lungs: dec BS at bases Abdomen: soft, does not seem like ascites Extremities: pitting edema  Skin: warm and dry Neuro: sedated   Assessment/Plan: 37 year old BM with known cirrhosis with varices-  presented after drinking relapse and GIB with hgb down to 3-  now with hemodynamic instability and resp failure-  now with renal failure 1.Renal- baseline renal function normal-  presented with crt 1.6 that has steadily worsened in the setting of resp failure and hemodynamic instability.  AKI is due to either ATN or HRS or possibly both.  I agree with the medical therapy for HRS that has been started-  albumin, octreotide, midodrine and levophed.  Right now is oliguric but there are no absolute indications for dialysis.  I would recommend continuing to observe with new therapies just started today.  The prognosis is poor and at times dialysis is not indicated for HRS but since pt so young it is complicated.  Query whether this patient would be a candidate for Terlipressin but usually SOC is recommended first in this setting  2. Hypertension/volume  - is overloaded but BP is soft.  Has not responded to lasix challenge-  try to lmit ins-  will dec the d5 he is getting.  Then  see what new therapies are able to do in terms of turning around his AKI to help resolve  3. Anemia  - likely due to variceal bleed and inc inr-  supportive care    Cecille Aver 01/26/2023, 10:19 AM

## 2023-01-26 NOTE — Progress Notes (Signed)
NAME:  [name], MRN:  [id number], DOB:  [birth year], LOS: 3 ADMISSION DATE:  01/23/2023, CONSULTATION DATE:  01/26/23  REFERRING MD: ED Dr., CHIEF COMPLAINT: Weakness  Critical Care Progress Note  History of Present Illness:  37 year old man presents to the ED with weakness found to have hemoglobin of 3.  He denies any hematemesis.  No hemoptysis.  No hematochezia.  Denies melena.  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  Last drink 2 days prior to admission.  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  He was hospitalized 10/2022 with GI bleed.  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  Also some portal hypertensive gastropathy.  At home he takes Lasix and spironolactone for lower extremity edema.  He also takes lactulose.  Upon arrival, he was tachycardic to the 170s and SVT.  He was cardioverted.  He remained normotensive.  He remains tachycardic but less so.  His T. bili seems at baseline.  LFTs seem at recent baseline as well.  Last month notable for acute renal failure, severe elevation in creatinine.  Pertinent  Medical History  Alcoholic cirrhosis, known history of varices, known history of portal hypertensive gastropathy  Significant Hospital Events: Including procedures, antibiotic start and stop dates in addition to other pertinent events   01/23/2023 presents to ED with generalized weakness over the last several days, hemoglobin of 3, denies signs of GI bleeding. Initial PLTs 222, INR 2.5, scr 1.6 (baseline 0.5 range). Got blood (4 units PRBC) admitted to ICU. GI consulted. Placed on Octreotide AND PPI infusion. GI recommended CT angio abdomen and pelvis with BRTO. Empiric CTX initiated  4/26 developed increased WOB over course of night. Also having gross hematuria w/ clots. CT angio showed: 1. Cirrhotic liver with borderline splenomegaly, large portosystemic venous collaterals, small gastric and esophageal  varices. 2. No ascites. 3. Cholelithiasis. 4. Small bilateral pleural effusions right greater than left. Case d/w both IR and Urology by night coverage. Hgb at night 5.3. Got another 2 units of blood and also lasix. By 0600 had developed progressive resp failure and was intubated. Seen by urology. Interestingly no blood in foley after insertion.  Intubated by CCM GI consult Dr Marca Ancona - MELD-Na 33 Uro consult 4/26 IR consult Dr Lowella Dandy- , not a candidate for TIPS because of advanced MELD score and unusual vascular anatomy.  Did not respond to lasix 80mg   -> ECHO ef 55% -> lasix 120mg  Iv x 1 given Tracheal aspirate: MRSA PCR negative  4/27  -remains on the ventilator FiO2 40%.  [facility] plans endoscopy later today.  Is on octreotide infusion, propofol infusion Fentanyl infusion along with D5 normal saline.  He is on antibiotic Rocephin.  Remains afebrile.  Worsening renal failure with creatinine 2.16.  Made 680 cc of urine yesterday. Status post EGD showed small esophageal varices.  No evidence of active bleeding.  GI recommended continuing octreotide.  OG tube left in place. Interim History / Subjective:    4/28 Failed SBT. Now on fent gtt. Jaundiced - bilit 14.6 and worse. .  INR 2.9 worse.  Creatinine also worse 2.8 mg percent.-despite albumin.Marland Kitchen  Urine output trailing off: 270 mL in 24 hours and 120 mL in the last 8 hours.  Blood pressure is soft with a low diastolic and a MAP of 57.  Hemoglobin less than 7 g% and 1 unit of packed red cells given. SO far culture negative. MELD-Na 39 &  worse  Objective   Blood pressure (!) 101/36, pulse 70, temperature 98.1 F (36.7 C), temperature source Axillary, resp. rate (!) 21, height 5\' 9"  (1.753 m), weight 119.6 kg, SpO2 96 %.    Vent Mode: PRVC FiO2 (%):  [35 %-40 %] 35 % Set Rate:  [20 bmp] 20 bmp Vt Set:  [560 mL] 560 mL PEEP:  [8 cmH20] 8 cmH20 Plateau Pressure:  [23 cmH20-27 cmH20] 26 cmH20   Intake/Output Summary (Last 24 hours) at 01/26/2023  0842 Last data filed at 01/26/2023 0813 Gross per 24 hour  Intake 2400.51 ml  Output 310 ml  Net 2090.51 ml   Filed Weights   01/23/23 0850 01/25/23 0400 01/26/23 0500  Weight: 99.8 kg 120.4 kg 119.6 kg    Examination:  General Appearance:  Looks criticall ill OBESE - + Head:  Normocephalic, without obvious abnormality, atraumatic Eyes:  PERRL - yes, conjunctiva/corneas - JAUNDICED     Ears:  Normal external ear canals, both ears Nose:  G tube - no Throat:  ETT TUBE - YEs , OG tube - yes Neck:  Supple,  No enlargement/tenderness/nodules Lungs: Clear to auscultation bilaterally, Ventilator   Synchrony - yes Heart:  S1 and S2 normal, no murmur, CVP - no.  Pressors - no Abdomen:  Soft, no masses, no organomegaly Genitalia / Rectal:  Not done Extremities:  Extremities- intact Skin:  ntact in exposed areas . Sacral area - not examined Neurologic:  Sedation - fent gtt -> RASS - -1 . Moves all 4s - yes. CAM-ICU - x . Orientation - did follow commands    Resolved Hospital Problem list     Assessment & Plan:  Acute blood loss severe anemia: mixed source Hematuria (and) GI. initially Suspected GI source given history of cirrhosis and prior findings on EGD, portal hypertensive gastropathy, varices. However he has had  No hematemesis.  Denies melena but did have sig. Hematuria voiding blood clots  -has been seen by urology. Interestingly does not have any gross hematuria at present. Could he have uretral lesion that's now tamponaded w/ foley?   4/27 - suspected portal gastropathy bleed.  EGD pending later today.  No active bleeding.  Not a candidate for IR procedure 4/.28 - no active bleed in EGD.  Has varix but not banded.   Plan Cont octreotide and continue PPI BID Cont serial CBC gaol hgb > 7 Repeating vit K (am INR) 01/26/23 Will send UA and UC.  Urology has seen. Can place irrigation foley if needed    Acute hypoxic respiratory failure due to diffuse pulmonary edema +/-  aspiration (favor edema)  01/26/2023: Does not meet extubation criteria because  and worsening renal insufficiency and MELD score  Plan Cont full vent support   Acute kidney failure & hematuria : initially felt 2/2 hypovolemia. But hematuria earlier makes me wonder about post-obstructive process. Now has foley, had lasix earlier today so urine Na and cr not likely to be accurate  01/26/2023: AKI worse. Ur OP dropping MELD worse. Concern for Hepato Renal Syndrome  Plan Increase albumin to 25gm Q6h x 48h STart midodrine 01/26/23 Start levophed 01/26/23 for MAP > 65 Renal Consult - Dr Vallery Sa called  Monitor urine output   Presumed cirrhosis with nodular contour on ultrasound right upper quadrant 07/26/2022 and?  Early cirrhosis July 2022 C abdomen Confirmed cirrhosis on CT angio abdomen 10/26/2022 Cirrhosis: Presumed alcoholic cirrhosis.  He states he was sober for 8 months but relapsed 2 days ago, last  drink 2 days prior to admission. LFT elevation, hyperbilirubinemi with concern for acute alcoholic hepatitis diagnosed 01/25/2023: In the setting of cirrhosis, bilirubin appears near more recent baseline.  MELD score 33 on 01/24/23.  Maddrey discriminant function score 68.3 on 01/25/2023  01/25/23:  Maddrey discriminant function score 68.3 01/26/23: INR/Bili worse  Plan Prednisolone 40 mg/day on 01/25/2023 -> check Lille Score day 7 Continue to monitor Check ammonia levels Cotniniue lactulose Continue Xifaxan  [facility] consult appreciated  Await Hep Virus panel per GI 01/25/23   Hepatic encephalopathy: His mental status was clear and coherent.  4/28  still holding  Plan lactulose since 01/25/2023 Rifaximin since 01/26/23   LLE leg wound Seen by WOC. Measurements:  3 cms x 2 cms x 0.4 cm    Plan Clean site w/ NS and apply medihoney to wound daily then fill wound w/ saline moistened gauze and cover w/ dry gauze and silicone foam  Should f/u w/ wound center at dc  Glycemic  control  Plan Ssi   Best Practice (right click and "Reselect all SmartList Selections" daily)   Diet/type: NPO w/ oral meds -. Start TF 01/26/23 DVT prophylaxis: SCD,  GI prophylaxis: N/A Lines: N/A Foley:  Yes, and it is still needed Code Status:  full code Last date of multidisciplinary goals of care discussion [discussed CODE STATUS with patient admission, he confirms full code]  01/25/2023: Mom and dad at the bedside.  [place]  Patient lives here with his significant other.  The mom is designated the fianc/girlfriend and the brother is the healthcare decision makers.  Mom admits to patient drinking alcohol.  Mom believes that with the fate of got they can fight through this critical illness.  I did indicate a poor prognosis.  Nurses spoken to the girlfriend and is mutual agreement that the brother will be the main signature for healthcare documents.   01/26/23 -Mom and dad updated at bedside. D/w DR Doretha Imus at bedside - he has conveyed grim prognosis to family    ATTESTATION & SIGNATURE   The patient [name] is critically ill with multiple organ systems failure and requires high complexity decision making for assessment and support, frequent evaluation and titration of therapies, application of advanced monitoring technologies and extensive interpretation of multiple databases and discussion with other appropriate health care personnel such as bedside nurses, social workers, case Production designer, theatre/television/film, consultants, respiratory therapists, nutritionists, secretaries etc.,  Critical care time includes but is not restricted to just documentation time. Documentation can happen in parallel or sequential to care time depending on case mix urgency and priorities for the shift. So, overall critical Care Time devoted to patient care services described in this note is  45  Minutes.   This time reflects time of care of this signee Dr Kalman Shan which includ does not reflect  procedure time, or teaching time or supervisory time of PA/NP/Med student/Med Resident etc but could involve care discussion time     Dr. Kalman Shan, M.D., [facility].C.P Pulmonary and Critical Care Medicine Medical Director - [facility] ICU Staff Physician, [facility] [place] [facility] Pager: [phone], If no answer or between  15:00h - 7:00h: call [phone]  01/26/2023 9:45 AM     LABS    PULMONARY Recent Labs  Lab 01/23/23 0914 01/24/23 0122 01/24/23 0829  PHART  --  7.43 7.47*  PCO2ART  --  35 36  PO2ART  --  79* 244*  HCO3  --  23.1 26.2  TCO2 22  --   --   O2SAT  --  100 100    CBC Recent Labs  Lab 01/24/23 1650 01/25/23 0727 01/26/23 0320  HGB 7.0* 7.5* 6.8*  HCT 22.2* 23.7* 21.7*  WBC 6.6 8.7 6.0  PLT 151 160 166    COAGULATION Recent Labs  Lab 01/23/23 0905 01/24/23 1844 01/25/23 0727 01/26/23 0320  INR 2.5* 2.3* 2.5* 2.9*    CARDIAC  No results for input(s): "TROPONINI" in the last 168 hours. No results for input(s): "PROBNP" in the last 168 hours.   CHEMISTRY Recent Labs  Lab 01/23/23 0905 01/23/23 0914 01/24/23 0549 01/24/23 1650 01/25/23 0727 01/26/23 0320  NA 130* 134* 129* 130* 130* 129*  K 3.2* 3.2* 3.5 3.4* 3.4* 4.1  CL 95* 95* 94* 97* 97* 97*  CO2 21*  --  23 22 22  19*  GLUCOSE 115* 114* 106* 96 140* 133*  BUN 17 15 18 19  23* 25*  CREATININE 1.60* 1.80* 1.50* 1.68* 2.16* 2.86*  CALCIUM 7.7*  --  7.9* 7.7* 8.0* 7.8*  MG 1.8  --  1.8  --   --   --   PHOS  --   --  4.5  --   --   --    Estimated Creatinine Clearance: 44.3 mL/min (A) (by C-G formula based on SCr of 2.86 mg/dL (H)).   LIVER Recent Labs  Lab 01/23/23 0905 01/24/23 1650 01/24/23 1844 01/25/23 0727 01/26/23 0320  AST 62* 43*  --  52* 43*  ALT 18 17  --  16 16  ALKPHOS 79 66  --  64 53  BILITOT 12.1* 14.4*  --  14.5* 14.6*  PROT 9.1* 8.1  --  8.4* 7.4  ALBUMIN 2.3* 2.3*  --  2.3* 2.2*  INR 2.5*  --   2.3* 2.5* 2.9*     INFECTIOUS Recent Labs  Lab 01/24/23 1118 01/25/23 0727 01/26/23 0320  LATICACIDVEN  --   --  1.2  PROCALCITON 0.29 0.60 0.94     ENDOCRINE CBG (last 3)  Recent Labs    01/26/23 0334 01/26/23 0418 01/26/23 0808  GLUCAP 118* 111* 107*         IMAGING x48h  - image(s) personally visualized  -   highlighted in bold DG Abd 1 View  Result Date: 01/25/2023 CLINICAL DATA:  [id number] Encounter for orogastric (OG) tube placement [id number] EXAM: ABDOMEN - 1 VIEW COMPARISON:  Chest film from earlier the same day FINDINGS: Gastric tube extends to the gastric antrum. Stomach is incompletely distended. A few gas distended small and large bowel loops are noted in the visualized upper abdomen. The lower abdomen is excluded. Opacities are seen in both lung bases as before. Stable cardiomegaly. IMPRESSION: Gastric tube to the gastric antrum. Electronically Signed   By: Corlis Leak M.D.   On: 01/25/2023 15:20   US Abdomen Limited RUQ (LIVER/GB)  Result Date: 01/25/2023 CLINICAL DATA:  Cirrhosis EXAM: ULTRASOUND ABDOMEN LIMITED RIGHT UPPER QUADRANT COMPARISON:  None Available. FINDINGS: Gallbladder: Probable layering stones in the gallbladder. Sludge. No Murphy's sign. Common bile duct: Diameter: 4 mm Liver: Coarsened heterogeneous increased echogenicity. No mass noted. Portal vein is patent. Reversal of blood flow identified. Other: None. IMPRESSION: 1. Cirrhotic liver. No mass identified. 2. Reversal of blood flow in the portal vein consistent with portal hypertension. 3. Probable layering stones in the gallbladder. Sludge. No Murphy's sign. Electronically Signed   By: Onalee Hua  Mayford Knife III M.D.   On: 01/25/2023 14:59   DG Chest Port 1 View  Result Date: 01/25/2023 CLINICAL DATA:  Pulmonary edema. EXAM: PORTABLE CHEST 1 VIEW COMPARISON:  01/24/2023 FINDINGS: Endotracheal tube is 5.0 cm above the carina. Nasogastric tube extends into the abdomen. Not clear if the tip is beyond the  image. Stable enlargement of the cardiac silhouette. Persistent perihilar densities are suggestive for edema. Hazy densities at lung bases could represent atelectasis and/or pleural fluid. Negative for a pneumothorax. IMPRESSION: 1. Cardiomegaly with perihilar densities and findings are suggestive for pulmonary edema. Chest radiograph findings have minimally changed. 2. Hazy densities at the lung bases could represent atelectasis and/or pleural fluid. 3. Support apparatuses as described. Electronically Signed   By: Richarda Overlie M.D.   On: 01/25/2023 08:37   ECHOCARDIOGRAM COMPLETE  Result Date: 01/24/2023    ECHOCARDIOGRAM REPORT   Patient Name:   [name] Date of Exam: 01/24/2023 Medical Rec #:  [id number]      Height:       69.0 in Accession #:    [id number]     Weight:       220.0 lb Date of Birth:  Mar 12, 1986       BSA:          2.151 m Patient Age:    37 years       BP:           145/62 mmHg Patient Gender: M              HR:           99 bpm. Exam Location:  Inpatient Procedure: 2D Echo, Color Doppler and Cardiac Doppler Indications:    CHF  History:        Patient has prior history of Echocardiogram examinations, most                 recent 01/12/2022. Risk Factors:Hypertension.  Sonographer:    Milbert Coulter Referring Phys: [id number] [facility]  Sonographer Comments: Echo performed with patient supine and on artificial respirator. IMPRESSIONS  1. Left ventricular ejection fraction, by estimation, is 50 to 55%. The left ventricle has low normal function. The left ventricle has no regional wall motion abnormalities. There is mild left ventricular hypertrophy. Left ventricular diastolic parameters are indeterminate.  2. Right ventricular systolic function is normal. The right ventricular size is moderately enlarged. There is mildly elevated pulmonary artery systolic pressure.  3. Right atrial size was mildly dilated.  4. No evidence of mitral valve regurgitation.  5. Tricuspid valve regurgitation is mild to  moderate.  6. The aortic valve was not well visualized. Aortic valve regurgitation is not visualized.  7. Aortic not well visualized.  8. The inferior vena cava is dilated in size with <50% respiratory variability, suggesting right atrial pressure of 15 mmHg. FINDINGS  Left Ventricle: Left ventricular ejection fraction, by estimation, is 50 to 55%. The left ventricle has low normal function. The left ventricle has no regional wall motion abnormalities. The left ventricular internal cavity size was normal in size. There is mild left ventricular hypertrophy. Left ventricular diastolic parameters are indeterminate. Right Ventricle: The right ventricular size is moderately enlarged. Right ventricular systolic function is normal. There is mildly elevated pulmonary artery systolic pressure. The tricuspid regurgitant velocity is 2.69 m/s, and with an assumed right atrial pressure of 15 mmHg, the estimated right ventricular systolic pressure is 43.9 mmHg. Left Atrium: Left atrial size was normal in size. Right Atrium: Right  atrial size was mildly dilated. Pericardium: There is no evidence of pericardial effusion. Mitral Valve: No evidence of mitral valve regurgitation. Tricuspid Valve: Tricuspid valve regurgitation is mild to moderate. Aortic Valve: The aortic valve was not well visualized. Aortic valve regurgitation is not visualized. Pulmonic Valve: Pulmonic valve regurgitation is not visualized. Aorta: Not well visualized. Venous: The inferior vena cava is dilated in size with less than 50% respiratory variability, suggesting right atrial pressure of 15 mmHg. IAS/Shunts: The interatrial septum was not well visualized.  LEFT VENTRICLE PLAX 2D LVIDd:         5.80 cm      Diastology LVIDs:         4.50 cm      LV e' medial:    9.79 cm/s LV PW:         1.10 cm      LV E/e' medial:  12.6 LV IVS:        1.10 cm      LV e' lateral:   11.70 cm/s LVOT diam:     2.10 cm      LV E/e' lateral: 10.5 LVOT Area:     3.46 cm  LV  Volumes (MOD) LV vol d, MOD A2C: 123.0 ml LV vol d, MOD A4C: 134.0 ml LV vol s, MOD A2C: 61.3 ml LV vol s, MOD A4C: 75.1 ml LV SV MOD A2C:     61.7 ml LV SV MOD A4C:     134.0 ml LV SV MOD BP:      60.2 ml RIGHT VENTRICLE RV Basal diam:  6.60 cm RV Mid diam:    5.60 cm RV S prime:     12.20 cm/s TAPSE (M-mode): 2.5 cm LEFT ATRIUM             Index        RIGHT ATRIUM           Index LA diam:        4.10 cm 1.91 cm/m   RA Area:     25.30 cm LA Vol (A2C):   53.6 ml 24.91 ml/m  RA Volume:   91.40 ml  42.48 ml/m LA Vol (A4C):   54.3 ml 25.24 ml/m LA Biplane Vol: 56.5 ml 26.26 ml/m  MITRAL VALVE                TRICUSPID VALVE MV Area (PHT): 3.42 cm     TR Peak grad:   28.9 mmHg MV Decel Time: 222 msec     TR Vmax:        269.00 cm/s MV E velocity: 123.00 cm/s MV A velocity: 135.00 cm/s  SHUNTS MV E/A ratio:  0.91         Systemic Diam: 2.10 cm Carolan Clines Electronically signed by Carolan Clines Signature Date/Time: 01/24/2023/10:37:05 AM    Final    US RENAL  Result Date: 01/24/2023 CLINICAL DATA:  Hematuria EXAM: RENAL / URINARY TRACT ULTRASOUND COMPLETE COMPARISON:  CT 10/26/2022 FINDINGS: Right Kidney: Renal measurements: 12.1 x 6.5 x 6.8 cm = volume: 281 mL. Normal cortical thickness and echogenicity. Suboptimally visualized due to shadowing related to body habitus. No gross evidence of mass or hydronephrosis. Left Kidney: Renal measurements: 11.9 x 6.9 x 6.5 cm = volume: 281 mL. Normal cortical thickness and echogenicity. Suboptimally visualized due to shadowing from body habitus. No mass or hydronephrosis. Bladder: Decompressed by Foley catheter, inadequately assessed Other: No gross shadowing calculi are seen at either kidney though assessment is  limited. IMPRESSION: No gross evidence of renal mass or hydronephrosis. Electronically Signed   By: Ulyses Southward M.D.   On: 01/24/2023 10:03

## 2023-01-26 NOTE — Plan of Care (Signed)
  Problem: Education: Goal: Knowledge of General Education information will improve Description: Including pain rating scale, medication(s)/side effects and non-pharmacologic comfort measures Outcome: Progressing   Problem: Health Behavior/Discharge Planning: Goal: Ability to manage health-related needs will improve Outcome: Progressing   Problem: Clinical Measurements: Goal: Ability to maintain clinical measurements within normal limits will improve Outcome: Progressing Goal: Will remain free from infection Outcome: Progressing Goal: Diagnostic test results will improve Outcome: Progressing Goal: Respiratory complications will improve Outcome: Progressing Goal: Cardiovascular complication will be avoided Outcome: Progressing   Problem: Activity: Goal: Risk for activity intolerance will decrease Outcome: Progressing   Problem: Nutrition: Goal: Adequate nutrition will be maintained Outcome: Progressing   Problem: Coping: Goal: Level of anxiety will decrease Outcome: Progressing   Problem: Elimination: Goal: Will not experience complications related to bowel motility Outcome: Progressing Goal: Will not experience complications related to urinary retention Outcome: Progressing   Problem: Pain Managment: Goal: General experience of comfort will improve Outcome: Progressing   Problem: Safety: Goal: Ability to remain free from injury will improve Outcome: Progressing   Problem: Skin Integrity: Goal: Risk for impaired skin integrity will decrease Outcome: Progressing   Problem: Safety: Goal: Non-violent Restraint(s) Outcome: Progressing   Problem: Physical Regulation: Goal: Complications related to the disease process, condition or treatment will be avoided or minimized Outcome: Progressing   Problem: Safety: Goal: Ability to remain free from injury will improve Outcome: Progressing   Problem: Education: Goal: Ability to identify signs and symptoms of  gastrointestinal bleeding will improve Outcome: Progressing   Problem: Bowel/Gastric: Goal: Will show no signs and symptoms of gastrointestinal bleeding Outcome: Progressing   Problem: Fluid Volume: Goal: Will show no signs and symptoms of excessive bleeding Outcome: Progressing   Problem: Clinical Measurements: Goal: Complications related to the disease process, condition or treatment will be avoided or minimized Outcome: Progressing   

## 2023-01-26 NOTE — Progress Notes (Signed)
Brief Nutrition Note  Consult received for enteral/tube feeding initiation and management.  Adult Enteral Nutrition Protocol initiated. Full assessment to follow.  OG tube in place with tip located in gastric antrum per xray imaging.   Initiate TF's Vital 1.5 at 60 ml/h (1440 ml per day) *Start at 72mL/hr and advance by 10mL Q8H Prosource TF20 60 ml BID Provides 2320 kcal, 137 gm protein, 1100 ml free water daily   - Monitor magnesium, potassium, and phosphorus BID for at least 3 days, MD to replete as needed, as pt is at risk for refeeding syndrome.  -Free water flushes per CCM/MD  Admitting Dx: Anemia [D64.9]  Body mass index is 38.94 kg/m. Pt meets criteria for Obese Class II based on current BMI.  Labs:  Recent Labs  Lab 01/23/23 0905 01/23/23 0914 01/24/23 0549 01/24/23 1650 01/25/23 0727 01/26/23 0320  NA 130*   < > 129* 130* 130* 129*  K 3.2*   < > 3.5 3.4* 3.4* 4.1  CL 95*   < > 94* 97* 97* 97*  CO2 21*  --  23 22 22  19*  BUN 17   < > 18 19 23* 25*  CREATININE 1.60*   < > 1.50* 1.68* 2.16* 2.86*  CALCIUM 7.7*  --  7.9* 7.7* 8.0* 7.8*  MG 1.8  --  1.8  --   --   --   PHOS  --   --  4.5  --   --   --   GLUCOSE 115*   < > 106* 96 140* 133*   < > = values in this interval not displayed.    Leodis Rains, RDN, LDN  Clinical Nutrition

## 2023-01-26 NOTE — Progress Notes (Signed)
An USGPIV (ultrasound guided PIV) has been placed for short-term vasopressor infusion. A correctly placed ivWatch must be used when administering Vasopressors. Should this treatment be needed beyond 72 hours, central line access should be obtained.  It will be the responsibility of the bedside nurse to follow best practice to prevent extravasations. Primary RN Jewel Baize aware.

## 2023-01-27 ENCOUNTER — Encounter (HOSPITAL_COMMUNITY): Payer: Self-pay | Admitting: Gastroenterology

## 2023-01-27 ENCOUNTER — Inpatient Hospital Stay (HOSPITAL_COMMUNITY): Payer: Medicaid Other

## 2023-01-27 ENCOUNTER — Telehealth: Payer: Self-pay | Admitting: Pulmonary Disease

## 2023-01-27 DIAGNOSIS — D649 Anemia, unspecified: Secondary | ICD-10-CM

## 2023-01-27 DIAGNOSIS — K7290 Hepatic failure, unspecified without coma: Secondary | ICD-10-CM | POA: Diagnosis not present

## 2023-01-27 DIAGNOSIS — F1010 Alcohol abuse, uncomplicated: Secondary | ICD-10-CM | POA: Diagnosis not present

## 2023-01-27 DIAGNOSIS — I8511 Secondary esophageal varices with bleeding: Secondary | ICD-10-CM | POA: Diagnosis not present

## 2023-01-27 LAB — BASIC METABOLIC PANEL
Anion gap: 14 (ref 5–15)
BUN: 45 mg/dL — ABNORMAL HIGH (ref 6–20)
CO2: 18 mmol/L — ABNORMAL LOW (ref 22–32)
Calcium: 8.6 mg/dL — ABNORMAL LOW (ref 8.9–10.3)
Chloride: 98 mmol/L (ref 98–111)
Creatinine, Ser: 4.57 mg/dL — ABNORMAL HIGH (ref 0.61–1.24)
GFR, Estimated: 16 mL/min — ABNORMAL LOW (ref >60)
Glucose, Bld: 119 mg/dL — ABNORMAL HIGH (ref 70–99)
Potassium: 3.9 mmol/L (ref 3.5–5.1)
Sodium: 130 mmol/L — ABNORMAL LOW (ref 135–145)

## 2023-01-27 LAB — COMPREHENSIVE METABOLIC PANEL
ALT: 15 U/L (ref 0–44)
AST: 40 U/L (ref 15–41)
Albumin: 3.3 g/dL — ABNORMAL LOW (ref 3.5–5.0)
Alkaline Phosphatase: 55 U/L (ref 38–126)
Anion gap: 13 (ref 5–15)
BUN: 38 mg/dL — ABNORMAL HIGH (ref 6–20)
CO2: 20 mmol/L — ABNORMAL LOW (ref 22–32)
Calcium: 8.7 mg/dL — ABNORMAL LOW (ref 8.9–10.3)
Chloride: 97 mmol/L — ABNORMAL LOW (ref 98–111)
Creatinine, Ser: 4.16 mg/dL — ABNORMAL HIGH (ref 0.61–1.24)
GFR, Estimated: 18 mL/min — ABNORMAL LOW (ref >60)
Glucose, Bld: 134 mg/dL — ABNORMAL HIGH (ref 70–99)
Potassium: 4.1 mmol/L (ref 3.5–5.1)
Sodium: 130 mmol/L — ABNORMAL LOW (ref 135–145)
Total Bilirubin: 18 mg/dL — ABNORMAL HIGH (ref 0.3–1.2)
Total Protein: 9 g/dL — ABNORMAL HIGH (ref 6.5–8.1)

## 2023-01-27 LAB — SODIUM, URINE, RANDOM: Sodium, Ur: 42 mmol/L

## 2023-01-27 LAB — PHOSPHORUS
Phosphorus: 5.3 mg/dL — ABNORMAL HIGH (ref 2.5–4.6)
Phosphorus: 4.8 mg/dL — ABNORMAL HIGH (ref 2.5–4.6)

## 2023-01-27 LAB — PROTIME-INR
INR: 2.6 — ABNORMAL HIGH (ref 0.8–1.2)
Prothrombin Time: 27.2 seconds — ABNORMAL HIGH (ref 11.4–15.2)

## 2023-01-27 LAB — TYPE AND SCREEN
Antibody Screen: NEGATIVE
Unit division: 0
Unit division: 0

## 2023-01-27 LAB — CBC
HCT: 25 % — ABNORMAL LOW (ref 39.0–52.0)
Hemoglobin: 7.8 g/dL — ABNORMAL LOW (ref 13.0–17.0)
MCH: 26.9 pg (ref 26.0–34.0)
MCHC: 31.2 g/dL (ref 30.0–36.0)
MCV: 86.2 fL (ref 80.0–100.0)
Platelets: 179 10*3/uL (ref 150–400)
RBC: 2.9 MIL/uL — ABNORMAL LOW (ref 4.22–5.81)
RDW: 19.5 % — ABNORMAL HIGH (ref 11.5–15.5)
WBC: 6.3 10*3/uL (ref 4.0–10.5)
nRBC: 5.4 % — ABNORMAL HIGH (ref 0.0–0.2)

## 2023-01-27 LAB — BPAM RBC
Blood Product Expiration Date: 202405182359
Blood Product Expiration Date: 202405312359
Blood Product Expiration Date: 202406012359
ISSUE DATE / TIME: 202404251054
ISSUE DATE / TIME: 202404261246
ISSUE DATE / TIME: 202404280618
Unit Type and Rh: 5100
Unit Type and Rh: 5100
Unit Type and Rh: 5100
Unit Type and Rh: 5100
Unit Type and Rh: 5100
Unit Type and Rh: 5100

## 2023-01-27 LAB — GLUCOSE, CAPILLARY
Glucose-Capillary: 109 mg/dL — ABNORMAL HIGH (ref 70–99)
Glucose-Capillary: 121 mg/dL — ABNORMAL HIGH (ref 70–99)
Glucose-Capillary: 129 mg/dL — ABNORMAL HIGH (ref 70–99)
Glucose-Capillary: 130 mg/dL — ABNORMAL HIGH (ref 70–99)
Glucose-Capillary: 130 mg/dL — ABNORMAL HIGH (ref 70–99)
Glucose-Capillary: 131 mg/dL — ABNORMAL HIGH (ref 70–99)

## 2023-01-27 LAB — MAGNESIUM
Magnesium: 2.2 mg/dL (ref 1.7–2.4)
Magnesium: 2.1 mg/dL (ref 1.7–2.4)

## 2023-01-27 LAB — CREATININE, URINE, RANDOM: Creatinine, Urine: 248 mg/dL

## 2023-01-27 MED ORDER — ADULT MULTIVITAMIN LIQUID CH
15.0000 mL | Freq: Every day | ORAL | Status: DC
  Administered 2023-01-27 – 2023-01-30 (×4): 15 mL via ORAL
  Filled 2023-01-27 (×4): qty 15

## 2023-01-27 MED ORDER — MIDODRINE HCL 5 MG PO TABS
10.0000 mg | ORAL_TABLET | Freq: Three times a day (TID) | ORAL | Status: DC
  Administered 2023-01-27 – 2023-01-29 (×5): 10 mg
  Filled 2023-01-27 (×5): qty 2

## 2023-01-27 MED ORDER — RIFAXIMIN 550 MG PO TABS
550.0000 mg | ORAL_TABLET | Freq: Two times a day (BID) | ORAL | Status: DC
  Administered 2023-01-27 – 2023-02-03 (×16): 550 mg
  Filled 2023-01-27 (×16): qty 1

## 2023-01-27 MED ORDER — THIAMINE HCL 100 MG/ML IJ SOLN
100.0000 mg | Freq: Every day | INTRAMUSCULAR | Status: DC
  Administered 2023-01-27 – 2023-02-04 (×4): 100 mg via INTRAVENOUS
  Filled 2023-01-27 (×4): qty 2

## 2023-01-27 MED ORDER — POLYETHYLENE GLYCOL 3350 17 G PO PACK
17.0000 g | PACK | Freq: Every day | ORAL | Status: DC | PRN
  Administered 2023-01-30: 17 g
  Filled 2023-01-27: qty 1

## 2023-01-27 MED ORDER — LACTULOSE 10 GM/15ML PO SOLN
20.0000 g | Freq: Two times a day (BID) | ORAL | Status: DC
  Administered 2023-01-27 – 2023-02-03 (×16): 20 g
  Filled 2023-01-27 (×17): qty 30

## 2023-01-27 MED ORDER — FOLIC ACID 1 MG PO TABS
1.0000 mg | ORAL_TABLET | Freq: Every day | ORAL | Status: DC
  Administered 2023-01-27 – 2023-02-03 (×8): 1 mg
  Filled 2023-01-27 (×8): qty 1

## 2023-01-27 MED ORDER — IPRATROPIUM-ALBUTEROL 0.5-2.5 (3) MG/3ML IN SOLN
3.0000 mL | RESPIRATORY_TRACT | Status: DC | PRN
  Administered 2023-01-27 – 2023-01-28 (×3): 3 mL via RESPIRATORY_TRACT
  Filled 2023-01-27 (×2): qty 3

## 2023-01-27 MED ORDER — THIAMINE MONONITRATE 100 MG PO TABS
100.0000 mg | ORAL_TABLET | Freq: Every day | ORAL | Status: DC
  Administered 2023-01-28 – 2023-02-02 (×5): 100 mg
  Filled 2023-01-27 (×6): qty 1

## 2023-01-27 MED ORDER — MIDAZOLAM HCL 2 MG/2ML IJ SOLN
2.0000 mg | INTRAMUSCULAR | Status: AC | PRN
  Administered 2023-01-27 – 2023-01-28 (×2): 2 mg via INTRAVENOUS
  Filled 2023-01-27 (×2): qty 2

## 2023-01-27 NOTE — TOC Progression Note (Addendum)
Transition of Care [facility]) - Progression Note    Patient Details  Name: [name] MRN: [id number] Date of Birth: 20-Oct-1985  [facility] CM/SW Contact  Beckie Busing, RN Phone Number:[phone]  01/27/2023, 10:22 AM  Clinical Narrative:    TOC acknowledges consult for Substance Abuse Couseling/Education. Patient is currently intubated and can not participate in assessment. TOC will continue to follow for appropriate time to assess patient and offer resources.         Expected Discharge Plan and Services                                               Social Determinants of Health (SDOH) Interventions SDOH Screenings   Food Insecurity: No Food Insecurity (01/24/2023)  Housing: Low Risk  (01/24/2023)  Transportation Needs: No Transportation Needs (01/24/2023)  Utilities: Not At Risk (01/24/2023)  Depression (PHQ2-9): Low Risk  (05/21/2021)  Tobacco Use: Low Risk  (01/25/2023)    Readmission Risk Interventions     No data to display

## 2023-01-27 NOTE — Progress Notes (Signed)
eLink Physician-Brief Progress Note Patient Name: [name] DOB: 25-Oct-1985 MRN: [id number]   Date of Service  01/27/2023  HPI/Events of Note  Pt intubated, awake  eICU Interventions  Versed ordered for sedation.          Meyah Corle M DELA CRUZ 01/27/2023, 7:58 PM

## 2023-01-27 NOTE — Telephone Encounter (Signed)
Opened encounter in error  

## 2023-01-27 NOTE — Progress Notes (Signed)
[facility] Progress Note  Subjective: UOP continues to decline, close to none today so far. 185 cc last 24 hrs. B/ Cr up to 38/ 4.16 today.   Vitals:   01/27/23 0910 01/27/23 1207 01/27/23 1208 01/27/23 1300  BP:    (!) 140/59  Pulse:    80  Resp:    (!) 26  Temp:      TempSrc:      SpO2: 97% 98% 97% 93%  Weight:      Height:        Exam: General: obese BM-  sedated on the vent Neck: unable to tell JVD Heart: RRR Lungs: dec BS at bases Abdomen: soft, does not seem like ascites Extremities: ++pitting edema  Skin: warm and dry Neuro: sedated    UA 4/26 - prot 30, rbc 21-50 rbc, 6-10 wbc, rare bact  UNa 4/28 = 45   UCr pend   Renal US - 12.1/ 11.9 cm kidneys w/o hydro or mass   B/l creatinine = 0.50 from 11/01/22      Assessment/ Plan: AKI - b/l creat 0.50 from 11/01/22. Creat here was 1.6 on admission 4/25 and has risen up to 2.8 yest and 4.1 today. UOP has dropped off over the last 3 days. Pressors were started about 24 hrs ago 4/28. Urine color has darkened. UNa 45. Suspect this is ATN due to shock and/or hyperbilirubinemia. HRS is less likely in that HRS is slower to evolve and the UNa should be < 10. CXR's suggest vol overload. Suspect will need CRRT in the next 24-48 hrs. Will follow.  Shock / hypotension - on pressors started yesterday.  ETOH abuse AHRF - w/ vol overload +/- aspiration Anemia - transfuse prn   Vinson Moselle MD [facility] 01/27/2023, 2:46 PM  Recent Labs  Lab 01/26/23 0320 01/26/23 1248 01/26/23 1635 01/27/23 0529  HGB 6.8*  --   --  7.8*  ALBUMIN 2.2*  --   --  3.3*  CALCIUM 7.8*  --   --  8.7*  PHOS  --    < > 5.1* 5.3*  CREATININE 2.86*  --   --  4.16*  K 4.1  --   --  4.1   < > = values in this interval not displayed.   No results for input(s): "IRON", "TIBC", "FERRITIN" in the last 168 hours. Inpatient medications:  Chlorhexidine Gluconate Cloth  6 each Topical Daily   docusate  100 mg Per Tube BID   feeding supplement  (PROSource TF20)  60 mL Per Tube BID   folic acid  1 mg Per Tube Daily   insulin aspart  0-15 Units Subcutaneous Q4H   lactulose  20 g Per Tube BID   leptospermum manuka honey  1 Application Topical Daily   midodrine  10 mg Per Tube TID WC   multivitamin  15 mL Oral Daily   mouth rinse  15 mL Mouth Rinse Q2H   pantoprazole (PROTONIX) IV  40 mg Intravenous Q12H   polyethylene glycol  17 g Per Tube Daily   prednisoLONE  40 mg Per Tube Daily   rifaximin  550 mg Per Tube BID   thiamine  100 mg Per Tube Daily   Or   thiamine  100 mg Intravenous Daily    sodium chloride 10 mL/hr at 01/27/23 1414   albumin human Stopped (01/27/23 1214)   dextrose 5 % and 0.9% NaCl 25 mL/hr at 01/27/23 1414   feeding supplement (VITAL 1.5 CAL) 20  mL/hr at 01/27/23 1414   fentaNYL infusion INTRAVENOUS 25 mcg/hr (01/27/23 1414)   norepinephrine (LEVOPHED) Adult infusion 1 mcg/min (01/27/23 1414)   octreotide (SANDOSTATIN) 500 mcg in sodium chloride 0.9 % 250 mL (2 mcg/mL) infusion 50 mcg/hr (01/27/23 1414)   docusate sodium, fentaNYL, ipratropium-albuterol, ondansetron (ZOFRAN) IV, mouth rinse, polyethylene glycol

## 2023-01-27 NOTE — Progress Notes (Signed)
RT note: Pt. seen on ICU/SD rounds with RN currently sedating pt. due to >'d agitation, s/p recent prior visit by family, developed some tachycardia along with desaturations. biting down on ET tube resulting in >'d pressures on Vent, taken off and bagged 15l/100% with some moderate difficulty-instilled ~ 8 cc NSS along with X! PRN scheduled Duoneb and sx.'d with inline catheter while bagging for small # old blood, RN currently sedating along with E-Link on Cam, pt. levelled some s/p, RT to monitor.

## 2023-01-27 NOTE — Progress Notes (Signed)
[facility] Progress Note  SUBJECTIVE:   Interval history: [name] was seen and evaluated today at bedside.  He was intubated on my exam, awake and able to follow commands, denied having pain.  Family at bedside.  Discussion with nursing, no bowel movement today, small amount of urine output.  Nephrology following for possible CRRT, less likely HRS.  Past Medical History:  Diagnosis Date   Liver cirrhosis, alcoholic (HCC)    Portal hypertension (HCC)    Splenomegaly, congestive, chronic    Past Surgical History:  Procedure Laterality Date   COSMETIC SURGERY     ESOPHAGOGASTRODUODENOSCOPY N/A 04/26/2021   Procedure: ESOPHAGOGASTRODUODENOSCOPY (EGD);  Surgeon: Willis Modena, MD;  Location: [facility];  Service: Endoscopy;  Laterality: N/A;   ESOPHAGOGASTRODUODENOSCOPY (EGD) WITH PROPOFOL N/A 11/01/2022   Procedure: ESOPHAGOGASTRODUODENOSCOPY (EGD) WITH PROPOFOL;  Surgeon: Vida Rigger, MD;  Location: [facility];  Service: Gastroenterology;  Laterality: N/A;   MANDIBLE FRACTURE SURGERY     Current Facility-Administered Medications  Medication Dose Route Frequency Provider Last Rate Last Admin   0.9 %  sodium chloride infusion  250 mL Intravenous Continuous Kalman Shan, MD 10 mL/hr at 01/27/23 1414 Infusion Verify at 01/27/23 1414   albumin human 25 % solution 25 g  25 g Intravenous Q6H Kalman Shan, MD   Stopped at 01/27/23 1214   Chlorhexidine Gluconate Cloth 2 % PADS 6 each  6 each Topical Daily Hunsucker, Lesia Sago, MD   6 each at 01/26/23 0941   dextrose 5 %-0.9 % sodium chloride infusion   Intravenous Continuous Annie Sable, MD 25 mL/hr at 01/27/23 1414 Infusion Verify at 01/27/23 1414   docusate (COLACE) 50 MG/5ML liquid 100 mg  100 mg Per Tube BID Simonne Martinet, NP   100 mg at 01/27/23 1029   docusate sodium (COLACE) capsule 100 mg  100 mg Oral BID PRN Hunsucker, Lesia Sago, MD       feeding supplement (PROSource TF20) liquid 60 mL  60 mL  Per Tube BID Kalman Shan, MD   60 mL at 01/27/23 1038   feeding supplement (VITAL 1.5 CAL) liquid 1,000 mL  1,000 mL Per Tube Continuous Kalman Shan, MD 20 mL/hr at 01/27/23 1414 Infusion Verify at 01/27/23 1414   fentaNYL (SUBLIMAZE) bolus via infusion 50-100 mcg  50-100 mcg Intravenous Q15 min PRN Simonne Martinet, NP   100 mcg at 01/27/23 1149   fentaNYL in NS (40mcg/ml) infusion-PREMIX  50-200 mcg/hr Intravenous Continuous Simonne Martinet, NP 2.5 mL/hr at 01/27/23 1414 25 mcg/hr at 01/27/23 1414   folic acid (FOLVITE) tablet 1 mg  1 mg Per Tube Daily Omar Person, MD   1 mg at 01/27/23 1029   insulin aspart (novoLOG) injection 0-15 Units  0-15 Units Subcutaneous Q4H Hunsucker, Lesia Sago, MD   2 Units at 01/27/23 1136   ipratropium-albuterol (DUONEB) 0.5-2.5 (3) MG/3ML nebulizer solution 3 mL  3 mL Nebulization Q4H PRN Omar Person, MD   3 mL at 01/27/23 1206   lactulose (CHRONULAC) 10 GM/15ML solution 20 g  20 g Per Tube BID Omar Person, MD   20 g at 01/27/23 1028   leptospermum manuka honey (MEDIHONEY) paste 1 Application  1 Application Topical Daily Hunsucker, Lesia Sago, MD   1 Application at 01/26/23 0943   midodrine (PROAMATINE) tablet 10 mg  10 mg Per Tube TID WC Omar Person, MD   10 mg at 01/27/23 1147   multivitamin liquid 15 mL  15 mL  Oral Daily Ollis, Brandi L, NP   15 mL at 01/27/23 1139   norepinephrine (LEVOPHED) 4mg  in (0.016 mg/mL) premix infusion  2-10 mcg/min Intravenous Titrated Omar Person, MD 3.75 mL/hr at 01/27/23 1414 1 mcg/min at 01/27/23 1414   octreotide (SANDOSTATIN) 500 mcg in sodium chloride 0.9 % 250 mL (2 mcg/mL) infusion  50 mcg/hr Intravenous Continuous Hunsucker, Lesia Sago, MD 25 mL/hr at 01/27/23 1414 50 mcg/hr at 01/27/23 1414   ondansetron (ZOFRAN) injection 4 mg  4 mg Intravenous Q6H PRN Hunsucker, Lesia Sago, MD       Oral care mouth rinse  15 mL Mouth Rinse Q2H Simonne Martinet, NP   15 mL at  01/27/23 1440   Oral care mouth rinse  15 mL Mouth Rinse PRN Simonne Martinet, NP       pantoprazole (PROTONIX) injection 40 mg  40 mg Intravenous Q12H Hunsucker, Lesia Sago, MD   40 mg at 01/27/23 1038   polyethylene glycol (MIRALAX / GLYCOLAX) packet 17 g  17 g Per Tube Daily Simonne Martinet, NP   17 g at 01/27/23 1033   polyethylene glycol (MIRALAX / GLYCOLAX) packet 17 g  17 g Per Tube Daily PRN Omar Person, MD       prednisoLONE (ORAPRED) 15 MG/5ML solution 40 mg  40 mg Per Tube Daily Kalman Shan, MD   40 mg at 01/27/23 1139   rifaximin (XIFAXAN) tablet 550 mg  550 mg Per Tube BID Omar Person, MD   550 mg at 01/27/23 1030   thiamine (VITAMIN B1) tablet 100 mg  100 mg Per Tube Daily Omar Person, MD       Or   thiamine (VITAMIN B1) injection 100 mg  100 mg Intravenous Daily Omar Person, MD   100 mg at 01/27/23 1029   Allergies as of 01/23/2023   (No Known Allergies)   Review of Systems:  Review of Systems  Reason unable to perform ROS: unable to reliably perform other ROS as patient intubated.  Gastrointestinal:  Negative for abdominal pain.    OBJECTIVE:   Temp:  [97.3 F (36.3 C)-98 F (36.7 C)] 97.5 F (36.4 C) (04/29 1207) Pulse Rate:  [60-80] 80 (04/29 1300) Resp:  [19-26] 26 (04/29 1300) BP: (70-140)/(47-76) 140/59 (04/29 1300) SpO2:  [92 %-100 %] 96 % (04/29 1522) FiO2 (%):  [30 %-35 %] 35 % (04/29 1522) Weight:  [123.6 kg] 123.6 kg (04/29 0545) Last BM Date :  (PTA) Physical Exam Cardiovascular:     Rate and Rhythm: Normal rate and regular rhythm.  Pulmonary:     Comments: Intubated. Abdominal:     General: There is distension.     Comments: Abdominal wall edema  Musculoskeletal:     Right lower leg: Edema present.     Left lower leg: Edema present.  Skin:    Coloration: Skin is jaundiced.  Neurological:     Mental Status: He is alert.     Labs: Recent Labs    01/25/23 0727 01/26/23 0320 01/27/23 0529  WBC 8.7 6.0 6.3   HGB 7.5* 6.8* 7.8*  HCT 23.7* 21.7* 25.0*  PLT 160 166 179   BMET Recent Labs    01/25/23 0727 01/26/23 0320 01/27/23 0529  NA 130* 129* 130*  K 3.4* 4.1 4.1  CL 97* 97* 97*  CO2 22 19* 20*  GLUCOSE 140* 133* 134*  BUN 23* 25* 38*  CREATININE 2.16* 2.86* 4.16*  CALCIUM 8.0* 7.8*  8.7*   LFT Recent Labs    01/27/23 0529  PROT 9.0*  ALBUMIN 3.3*  AST 40  ALT 15  ALKPHOS 55  BILITOT 18.0*   PT/INR Recent Labs    01/26/23 0320 01/27/23 0529  LABPROT 30.0* 27.2*  INR 2.9* 2.6*   Diagnostic imaging: DG CHEST PORT 1 VIEW  Result Date: 01/27/2023 CLINICAL DATA:  Hypoxia. EXAM: PORTABLE CHEST 1 VIEW COMPARISON:  01/26/2023. FINDINGS: 1419 hours. Endotracheal tube terminates in the midthoracic trachea. Enteric tube side port projects over the stomach. Stable cardiomegaly and mediastinal contours. Slightly improved pulmonary edema with stable small bilateral pleural effusions and adjacent bibasilar atelectasis. No pneumothorax. IMPRESSION: 1. Slightly improved pulmonary edema with stable small bilateral pleural effusions. 2. Stable support apparatus. Electronically Signed   By: Orvan Falconer M.D.   On: 01/27/2023 14:30   DG CHEST PORT 1 VIEW  Result Date: 01/26/2023 CLINICAL DATA:  Endotracheal tube present.  Abnormal chest x-ray. EXAM: PORTABLE CHEST 1 VIEW COMPARISON:  Radiographs 01/25/2023 and earlier. Chest CTA 10/26/2022. FINDINGS: 0543 hours. Lordotic positioning. The endotracheal and enteric tubes appear unchanged, although the tip of the lateral remains nonvisualized. The heart is enlarged. There are persistent perihilar opacities bilaterally which are most consistent with pulmonary edema. There are bibasilar pulmonary opacities, likely due to a combination of pleural effusions and bibasilar atelectasis. No pneumothorax or acute osseous abnormality. Telemetry leads overlie the chest. IMPRESSION: No significant change from prior study. Persistent pulmonary edema,  bibasilar atelectasis and pleural effusions. Stable support system. Electronically Signed   By: Carey Bullocks M.D.   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 esophageal varices and hepatic encephalopathy  -Concern for hepatorenal syndrome  -MELD 3.0: 40 at 01/27/2023  5:29 AM MELD-Na: 41 at 01/27/2023  5:29 AM Calculated from: Serum Creatinine: 4.16 mg/dL (Using max of 3 mg/dL) at 1/61/0960  4:54 AM Serum Sodium: 130 mmol/L at 01/27/2023  5:29 AM Total Bilirubin: 18.0 mg/dL at 0/98/1191  4:78 AM Serum Albumin: 3.3 g/dL at 2/95/6213  0:86 AM INR(ratio): 2.6 at 01/27/2023  5:29 AM Age at listing (hypothetical): 37 years Sex: Male at 01/27/2023  5:29 AM  -Not a candidate for TIPS given elevated INR and unusual vascular anatomy Coagulopathy, INR 2.6 today Normocytic anemia Hematuria, contributing to above Acute kidney injury, worsening Acute hypoxemic respiratory failure, intubated  PLAN: -Continue lactulose 20 g per tube twice daily, continue rifaximin 550 mg p.o. twice daily -Continue prednisolone 40 mg per tube daily (so long as there are no signs of active GI blood loss) -Continue octreotide and albumin as ordered -On vasopressor agents -Follow nephrology recommendations -Overall poor prognosis given multi organ failure and advanced stage of liver disease   LOS: 4 days   Liliane Shi, [facility]

## 2023-01-27 NOTE — Progress Notes (Addendum)
NAME:  Tatem Fesler, MRN:  [id number], DOB:  March 15, 1986, LOS: 4 ADMISSION DATE:  01/23/2023, CONSULTATION DATE:  4/28 REFERRING MD:  Dr. Eloise Harman , CHIEF COMPLAINT:  Weakness    History of Present Illness:  37 year old man presents to the ED with weakness found to have hemoglobin of 3.   He denies any hematemesis.  No hemoptysis.  No hematochezia.  Denies melena.  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  Last drink 2 days prior to admission.  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  He was hospitalized 10/2022 with GI bleed.  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  Also some portal hypertensive gastropathy.  At home he takes Lasix and spironolactone for lower extremity edema.  He also takes lactulose.   Upon arrival, he was tachycardic to the 170s and SVT.  He was cardioverted.  He remained normotensive.  He remains tachycardic but less so.  His T. bili seems at baseline.  LFTs seem at recent baseline as well.  Last month notable for acute renal failure, severe elevation in creatinine. Denied signs of GI bleeding. Initial PLTs 222, INR 2.5, scr 1.6 (baseline 0.5 range). Got blood (4 units PRBC) admitted to ICU. GI consulted. Placed on Octreotide + PPI infusion. GI recommended CT angio abdomen and pelvis with BRTO. Empiric CTX initiated.  Pertinent  Medical History  Alcoholic Cirrhosis with hx of Varices, Portal Hypertensive Gastropathy   Significant Hospital Events: Including procedures, antibiotic start and stop dates in addition to other pertinent events   4/25 Admit with weakness in setting of Hgb 3. GI consulted. Octreotide + PPI. Empiric CTX.  4/26 Increased WOB, gross hematuria w/clots.  CTA Abd/pelvis showed cirrhotic liver with borderline splenomegaly, large portosystemic venous collaterals, small gastric & esophageal varices, no ascites, cholelithiasis, small bilateral pleural effusions R>L. Hgb 5.3  overnight, 2 more units PRBC and lasix. Urology evaluation, no blood in foley after insertion.  Intubated.  MELDNa 33. IR consulted, not a candidate for TIPS b/c of advanced MELD score and vascular anatomy. No response to lasix. MRSA PCR neg.   4/27 Remains on vent with propofol/fentanyl, octreotide. Worsening AKI. EGD showed small esophageal varices, no evidence of active bleeding, OGT left in place.  4/28 Failed SBT. On fentanyl. Jaundice with worsening bili/INR.  Worsening creatinine, decreasing UOP. BP soft, Hgb <7, 1 unit PRBC given.   Interim History / Subjective:  Afebrile  Vent - 35%/PEEP 8  I/O 95 ml UOP, +2.9L in last 24 hours RN reports no acute events overnight.    Objective   Blood pressure (!) 132/57, pulse 66, temperature 97.7 F (36.5 C), temperature source Axillary, resp. rate 20, height 5\' 9"  (1.753 m), weight 123.6 kg, SpO2 99 %.    Vent Mode: PRVC FiO2 (%):  [35 %] 35 % Set Rate:  [20 bmp] 20 bmp Vt Set:  [560 mL] 560 mL PEEP:  [8 cmH20] 8 cmH20 Plateau Pressure:  [25 cmH20-28 cmH20] 28 cmH20   Intake/Output Summary (Last 24 hours) at 01/27/2023 4098 Last data filed at 01/27/2023 0557 Gross per 24 hour  Intake 3092.28 ml  Output 95 ml  Net 2997.28 ml   Filed Weights   01/25/23 0400 01/26/23 0500 01/27/23 0545  Weight: 120.4 kg 119.6 kg 123.6 kg    Examination: General: adult male lying in bed in NAD on vent, critically ill appearing HENT: MM pink/moist, ETT, scleral icterus, pupils =/reactive  Lungs: non-labored  on vent, lungs bilaterally clear with good air entry, no wheeze  Cardiovascular: s1s2 RRR, no m/r/g Abdomen: soft/non-tender, bsx4 active, tolerating TF Extremities: 3+ pitting edema in BLE up to thighs/abd Neuro: on 50 mcg fentanyl, awakens to voice, nods/interactive when spoken to, drifts back to sleep, follows commands GU: foley in place, minimal UOP   Tracheal Aspirate 4/26 > rare GPC's in pairs >   Resolved Hospital Problem list      Assessment & Plan:   Acute Blood Loss Anemia  In setting of hematuria, GIB. Largest contributor suspected to be GI given hx of cirrhosis, prior EGD findings and portal hypertensive gastropathy with varices. No hematemesis this admit.  Had significant hematuria with clots but no blood in foley on insertion - thought by Urology that he could have had a uretheral lesion that foley created tamponade. Varix on EGD but not banded.  -continue octreotide, PPI BID  -appreciate GI, Urology, IR  -continue serial CBC -transfuse for Hgb <7% or evidence of active bleeding  Decompensated Alcoholic Cirrhosis  ETOH Abuse  Nodular contour on RUQ [place], confirmed cirrhosis on CTA ABD.  Was sober for 8 months prior to admit, relapsed 2 days prior to admit and that was last drink. Maddrey's Discriminant Function Score 68 4/27. MELD 33. Worsening T. Bili/INR -at risk withdrawal  -continue lactulose  -follow ammonia intermittently  -prednisolone 40 mg QD, started 4/27  -continue xifaxan  -appreciate GI  -Hepatitis panel negative -follow INR/t.bili  Acute Hepatic / Metabolic Encephalopathy  -medications as above  -minimize sedation as able -frequent reorientation  -remove propofol from [facility] -PAD protocol, RASS GOAL 0 to -1 -folate, thiamine, MVI   Acute Hypoxic Respiratory Failure in setting of Pulmonary Edema, +/- Aspiration -PRVC 8cc/kg  -wean PEEP / FiO2 for sats >90% -follow intermittent CXR  -SBT daily / WUA when meets criteria   AKI Hyponatremia  Metabolic Acidosis   In setting of ATN or HRS, possibly both.  -appreciate Nephrology  -assess bladder scan, flush foley to ensure patency  -continue albumin, midodrine, octreotide for possible HRS -follow up PM labs -Trend BMP / urinary output -Replace electrolytes as indicated -Avoid nephrotoxic agents, ensure adequate renal perfusion  Hematuria  -follow UOP -Urology aware, can place irrigation foley if needed   LLE Wound  -seen by WOC  3cm x 2cm x 0.4cm -wound care per NSG -will need outpatient wound care follow up   Hyperglycemia  Steroid contribution  -SSI, moderate scale   Best Practice (right click and "Reselect all SmartList Selections" daily)  Diet/type: tubefeeds DVT prophylaxis: SCD GI prophylaxis: PPI Lines: N/A Foley:  Yes, and it is still needed Code Status:  full code Last date of multidisciplinary goals of care discussion: Brother, Amada Jupiter, updated on patients status, gravity of situation.  He indicates understanding.  Amada Jupiter reports pt has been largely non-compliant with meds, has issues with recurrent gum bleeding.  Parents are aware.  He would want his parents to make any decisions regarding his brothers care.   Nicoles Sedlacek, father, [phone] Eriel Doyon, [phone]  Critical care time: 42 minutes    [name], MSN, APRN, NP-C, AGACNP-BC  Pulmonary & Critical Care 01/27/2023, 7:33 AM   Please see [email or web address] for pager details.   From 7A-7P if no response, please call [phone] After hours, please call ELink [phone]

## 2023-01-28 ENCOUNTER — Inpatient Hospital Stay (HOSPITAL_COMMUNITY): Payer: Medicaid Other

## 2023-01-28 DIAGNOSIS — K7290 Hepatic failure, unspecified without coma: Secondary | ICD-10-CM | POA: Diagnosis not present

## 2023-01-28 DIAGNOSIS — R531 Weakness: Secondary | ICD-10-CM | POA: Diagnosis not present

## 2023-01-28 DIAGNOSIS — Z7189 Other specified counseling: Secondary | ICD-10-CM | POA: Diagnosis not present

## 2023-01-28 DIAGNOSIS — Z515 Encounter for palliative care: Secondary | ICD-10-CM

## 2023-01-28 DIAGNOSIS — K7460 Unspecified cirrhosis of liver: Secondary | ICD-10-CM | POA: Diagnosis not present

## 2023-01-28 DIAGNOSIS — J9601 Acute respiratory failure with hypoxia: Secondary | ICD-10-CM | POA: Diagnosis not present

## 2023-01-28 LAB — CULTURE, RESPIRATORY W GRAM STAIN

## 2023-01-28 LAB — COMPREHENSIVE METABOLIC PANEL
ALT: 12 U/L (ref 0–44)
AST: 35 U/L (ref 15–41)
Albumin: 3.6 g/dL (ref 3.5–5.0)
Alkaline Phosphatase: 66 U/L (ref 38–126)
Anion gap: 10 (ref 5–15)
BUN: 54 mg/dL — ABNORMAL HIGH (ref 6–20)
CO2: 21 mmol/L — ABNORMAL LOW (ref 22–32)
Calcium: 9.1 mg/dL (ref 8.9–10.3)
Chloride: 99 mmol/L (ref 98–111)
Creatinine, Ser: 5.03 mg/dL — ABNORMAL HIGH (ref 0.61–1.24)
GFR, Estimated: 14 mL/min — ABNORMAL LOW (ref >60)
Glucose, Bld: 127 mg/dL — ABNORMAL HIGH (ref 70–99)
Potassium: 4.4 mmol/L (ref 3.5–5.1)
Sodium: 130 mmol/L — ABNORMAL LOW (ref 135–145)
Total Bilirubin: 15.8 mg/dL — ABNORMAL HIGH (ref 0.3–1.2)
Total Protein: 9 g/dL — ABNORMAL HIGH (ref 6.5–8.1)

## 2023-01-28 LAB — CBC
HCT: 24 % — ABNORMAL LOW (ref 39.0–52.0)
Hemoglobin: 7.4 g/dL — ABNORMAL LOW (ref 13.0–17.0)
MCH: 26.9 pg (ref 26.0–34.0)
MCHC: 30.8 g/dL (ref 30.0–36.0)
MCV: 87.3 fL (ref 80.0–100.0)
Platelets: 154 10*3/uL (ref 150–400)
RBC: 2.75 MIL/uL — ABNORMAL LOW (ref 4.22–5.81)
RDW: 20 % — ABNORMAL HIGH (ref 11.5–15.5)
WBC: 6.3 10*3/uL (ref 4.0–10.5)
nRBC: 1.6 % — ABNORMAL HIGH (ref 0.0–0.2)

## 2023-01-28 LAB — RENAL FUNCTION PANEL
Albumin: 3.7 g/dL (ref 3.5–5.0)
Anion gap: 10 (ref 5–15)
BUN: 58 mg/dL — ABNORMAL HIGH (ref 6–20)
CO2: 21 mmol/L — ABNORMAL LOW (ref 22–32)
Calcium: 8.9 mg/dL (ref 8.9–10.3)
Chloride: 99 mmol/L (ref 98–111)
Creatinine, Ser: 4.93 mg/dL — ABNORMAL HIGH (ref 0.61–1.24)
GFR, Estimated: 14 mL/min — ABNORMAL LOW (ref >60)
Glucose, Bld: 128 mg/dL — ABNORMAL HIGH (ref 70–99)
Phosphorus: 7.2 mg/dL — ABNORMAL HIGH (ref 2.5–4.6)
Potassium: 5.3 mmol/L — ABNORMAL HIGH (ref 3.5–5.1)
Sodium: 130 mmol/L — ABNORMAL LOW (ref 135–145)

## 2023-01-28 LAB — GLUCOSE, CAPILLARY
Glucose-Capillary: 112 mg/dL — ABNORMAL HIGH (ref 70–99)
Glucose-Capillary: 114 mg/dL — ABNORMAL HIGH (ref 70–99)
Glucose-Capillary: 120 mg/dL — ABNORMAL HIGH (ref 70–99)
Glucose-Capillary: 121 mg/dL — ABNORMAL HIGH (ref 70–99)
Glucose-Capillary: 131 mg/dL — ABNORMAL HIGH (ref 70–99)
Glucose-Capillary: 141 mg/dL — ABNORMAL HIGH (ref 70–99)

## 2023-01-28 LAB — MAGNESIUM: Magnesium: 2.3 mg/dL (ref 1.7–2.4)

## 2023-01-28 MED ORDER — SODIUM CHLORIDE 0.9 % IV SOLN
500.0000 [IU]/h | INTRAVENOUS | Status: DC
  Administered 2023-01-28 – 2023-02-02 (×8): 500 [IU]/h via INTRAVENOUS_CENTRAL
  Filled 2023-01-28 (×3): qty 10000
  Filled 2023-01-28: qty 2
  Filled 2023-01-28 (×4): qty 10000

## 2023-01-28 MED ORDER — PRISMASOL BGK 4/2.5 32-4-2.5 MEQ/L REPLACEMENT SOLN: Status: DC

## 2023-01-28 MED ORDER — MIDAZOLAM HCL 2 MG/2ML IJ SOLN
2.0000 mg | INTRAMUSCULAR | Status: DC | PRN
  Administered 2023-01-28 – 2023-02-04 (×20): 2 mg via INTRAVENOUS
  Filled 2023-01-28 (×20): qty 2

## 2023-01-28 MED ORDER — ORAL CARE MOUTH RINSE: 15.0000 mL | OROMUCOSAL | Status: DC | PRN

## 2023-01-28 MED ORDER — DEXMEDETOMIDINE HCL IN NACL 200 MCG/50ML IV SOLN: 0.0000 ug/kg/h | INTRAVENOUS | Status: DC

## 2023-01-28 MED ORDER — ORAL CARE MOUTH RINSE
15.0000 mL | OROMUCOSAL | Status: DC
  Administered 2023-01-28 – 2023-02-04 (×88): 15 mL via OROMUCOSAL

## 2023-01-28 MED ORDER — HEPARIN SODIUM (PORCINE) 1000 UNIT/ML DIALYSIS: 1000.0000 [IU] | INTRAMUSCULAR | Status: DC | PRN

## 2023-01-28 MED ORDER — PRISMASOL BGK 4/2.5 32-4-2.5 MEQ/L EC SOLN: Status: DC

## 2023-01-28 MED ORDER — DEXMEDETOMIDINE HCL IN NACL 400 MCG/100ML IV SOLN
0.0000 ug/kg/h | INTRAVENOUS | Status: DC
  Administered 2023-01-28 (×2): 0.9 ug/kg/h via INTRAVENOUS
  Administered 2023-01-28: 1.1 ug/kg/h via INTRAVENOUS
  Administered 2023-01-28: 0.9 ug/kg/h via INTRAVENOUS
  Administered 2023-01-28: 0.4 ug/kg/h via INTRAVENOUS
  Administered 2023-01-29 (×2): 0.8 ug/kg/h via INTRAVENOUS
  Administered 2023-01-29: 0.9 ug/kg/h via INTRAVENOUS
  Administered 2023-01-29 – 2023-01-30 (×4): 0.8 ug/kg/h via INTRAVENOUS
  Administered 2023-01-30 – 2023-01-31 (×6): 0.9 ug/kg/h via INTRAVENOUS
  Administered 2023-01-31: 1 ug/kg/h via INTRAVENOUS
  Administered 2023-01-31: 0.9 ug/kg/h via INTRAVENOUS
  Administered 2023-01-31 (×4): 1.1 ug/kg/h via INTRAVENOUS
  Administered 2023-01-31 – 2023-02-01 (×2): 1.2 ug/kg/h via INTRAVENOUS
  Administered 2023-02-01 (×2): 1.1 ug/kg/h via INTRAVENOUS
  Administered 2023-02-01 (×2): 1.2 ug/kg/h via INTRAVENOUS
  Administered 2023-02-01: 1.1 ug/kg/h via INTRAVENOUS
  Administered 2023-02-01: 1 ug/kg/h via INTRAVENOUS
  Administered 2023-02-01: 1.1 ug/kg/h via INTRAVENOUS
  Administered 2023-02-02: 0.8 ug/kg/h via INTRAVENOUS
  Administered 2023-02-02: 1.2 ug/kg/h via INTRAVENOUS
  Administered 2023-02-02: 1 ug/kg/h via INTRAVENOUS
  Administered 2023-02-02: 1.2 ug/kg/h via INTRAVENOUS
  Administered 2023-02-02: 1 ug/kg/h via INTRAVENOUS
  Administered 2023-02-02: 1.2 ug/kg/h via INTRAVENOUS
  Administered 2023-02-02 – 2023-02-03 (×2): 1 ug/kg/h via INTRAVENOUS
  Administered 2023-02-03: 0.8 ug/kg/h via INTRAVENOUS
  Administered 2023-02-03 (×5): 1 ug/kg/h via INTRAVENOUS
  Administered 2023-02-04: 0.8 ug/kg/h via INTRAVENOUS
  Filled 2023-01-28: qty 200
  Filled 2023-01-28 (×2): qty 100
  Filled 2023-01-28: qty 200
  Filled 2023-01-28 (×42): qty 100

## 2023-01-28 MED ORDER — PROSOURCE TF20 ENFIT COMPATIBL EN LIQD
60.0000 mL | Freq: Three times a day (TID) | ENTERAL | Status: DC
  Administered 2023-01-28 – 2023-02-03 (×20): 60 mL
  Filled 2023-01-28 (×20): qty 60

## 2023-01-28 NOTE — Progress Notes (Signed)
CRRT successfully initiated at 1520, following confirmation of HD cath placement on CXR. VS stable at this time.This RN will continue to closely monitor patient for changes in hemodynamic status.

## 2023-01-28 NOTE — Progress Notes (Signed)
[facility] Progress Note  Subjective: UOP 100 cc yesterday, net I/O + 14 L since admission  Vitals:   01/28/23 1102 01/28/23 1113 01/28/23 1119 01/28/23 1200  BP:    (!) 152/96  Pulse:  (!) 122 (!) 112 (!) 106  Resp:  18 13 16   Temp:    98.5 F (36.9 C)  TempSrc:    Axillary  SpO2: 95% 94% 92% 90%  Weight:      Height:        Exam: General: obese BM-  sedated on the vent Heart: RRR Lungs: dec BS at bases Abdomen: soft, dec'd BS Extremities: diffuse tight bilat 3-4+ LE edema, 2-3+ UE edema Skin: warm and dry Neuro: sedated    UA 4/26 - prot 30, rbc 21-50 rbc, 6-10 wbc, rare bact  UNa 4/28 = 45   UCr pend   Renal US - 12.1/ 11.9 cm kidneys w/o hydro or mass   B/l creatinine = 0.50 from 11/01/22       Assessment/ Plan: AKI - b/l creat 0.50 from 11/01/22. Creat here was 1.6 on admission 4/25 and has risen up to 2.8 yest and 4.1 today. UOP has dropped off over the last 3 days. Pressors were started about 24 hrs ago 4/28. Urine color has darkened. UNa 45. Suspect this is ATN or hemodynamic due to shock and/or hyperbilirubinemia. HRS is less likely in that HRS is slower to evolve and the UNa should be < 10. Exam shows severe vol overload. Not sure if he will recover renal function or not, have d/w CCM. Will go ahead w/ trial of CRRT.  Shock / hypotension - on pressors low dose ETOH abuse Decomp alcoholic cirrhosis  AHRF - w/ vol overload +/- aspiration Anemia - transfuse prn   Vinson Moselle MD [facility] 01/28/2023, 12:48 PM  Recent Labs  Lab 01/27/23 0529 01/27/23 1548 01/27/23 1601 01/28/23 0555  HGB 7.8*  --   --  7.4*  ALBUMIN 3.3*  --   --  3.6  CALCIUM 8.7* 8.6*  --  9.1  PHOS 5.3*  --  4.8*  --   CREATININE 4.16* 4.57*  --  5.03*  K 4.1 3.9  --  4.4    No results for input(s): "IRON", "TIBC", "FERRITIN" in the last 168 hours. Inpatient medications:  Chlorhexidine Gluconate Cloth  6 each Topical Daily   docusate  100 mg Per Tube BID   feeding  supplement (PROSource TF20)  60 mL Per Tube BID   folic acid  1 mg Per Tube Daily   insulin aspart  0-15 Units Subcutaneous Q4H   lactulose  20 g Per Tube BID   leptospermum manuka honey  1 Application Topical Daily   midodrine  10 mg Per Tube TID WC   multivitamin  15 mL Oral Daily   mouth rinse  15 mL Mouth Rinse Q2H   pantoprazole (PROTONIX) IV  40 mg Intravenous Q12H   polyethylene glycol  17 g Per Tube Daily   prednisoLONE  40 mg Per Tube Daily   rifaximin  550 mg Per Tube BID   thiamine  100 mg Per Tube Daily   Or   thiamine  100 mg Intravenous Daily    sodium chloride 10 mL/hr at 01/28/23 1216   dexmedetomidine (PRECEDEX) IV infusion 1 mcg/kg/hr (01/28/23 1216)   dextrose 5 % and 0.9% NaCl 25 mL/hr at 01/28/23 1216   feeding supplement (VITAL 1.5 CAL) 60 mL/hr at 01/28/23 1216   fentaNYL  infusion INTRAVENOUS 200 mcg/hr (01/28/23 1216)   norepinephrine (LEVOPHED) Adult infusion Stopped (01/27/23 1601)   octreotide (SANDOSTATIN) 500 mcg in sodium chloride 0.9 % 250 mL (2 mcg/mL) infusion 50 mcg/hr (01/28/23 1216)   docusate sodium, fentaNYL, ipratropium-albuterol, midazolam, ondansetron (ZOFRAN) IV, mouth rinse, polyethylene glycol

## 2023-01-28 NOTE — Progress Notes (Addendum)
NAME:  [name], MRN:  [id number], DOB:  [birth year], LOS: 5 ADMISSION DATE:  01/23/2023, CONSULTATION DATE:  4/28 REFERRING MD:  Dr. Eloise Harman , CHIEF COMPLAINT:  Weakness    History of Present Illness:  37 year old man presents to the ED with weakness found to have hemoglobin of 3.   He denies any hematemesis.  No hemoptysis.  No hematochezia.  Denies melena.  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  Last drink 2 days prior to admission.  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  He was hospitalized 10/2022 with GI bleed.  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  Also some portal hypertensive gastropathy.  At home he takes Lasix and spironolactone for lower extremity edema.  He also takes lactulose.   Upon arrival, he was tachycardic to the 170s and SVT.  He was cardioverted.  He remained normotensive.  He remains tachycardic but less so.  His T. bili seems at baseline.  LFTs seem at recent baseline as well.  Last month notable for acute renal failure, severe elevation in creatinine. Denied signs of GI bleeding. Initial PLTs 222, INR 2.5, scr 1.6 (baseline 0.5 range). Got blood (4 units PRBC) admitted to ICU. GI consulted. Placed on Octreotide + PPI infusion. GI recommended CT angio abdomen and pelvis with BRTO. Empiric CTX initiated.  Pertinent  Medical History  Alcoholic Cirrhosis with hx of Varices, Portal Hypertensive Gastropathy   Significant Hospital Events: Including procedures, antibiotic start and stop dates in addition to other pertinent events   4/25 Admit with weakness in setting of Hgb 3. GI consulted. Octreotide + PPI. Empiric CTX.  4/26 Increased WOB, gross hematuria w/clots.  CTA Abd/pelvis showed cirrhotic liver with borderline splenomegaly, large portosystemic venous collaterals, small gastric & esophageal varices, no ascites, cholelithiasis, small bilateral pleural effusions R>L. Hgb 5.3  overnight, 2 more units PRBC and lasix. Urology evaluation, no blood in foley after insertion.  Intubated.  MELDNa 33. IR consulted, not a candidate for TIPS b/c of advanced MELD score and vascular anatomy. No response to lasix. MRSA PCR neg.   4/27 Remains on vent with propofol/fentanyl, octreotide. Worsening AKI. EGD showed small esophageal varices, no evidence of active bleeding, OGT left in place.  4/28 Failed SBT. On fentanyl. Jaundice with worsening bili/INR.  Worsening creatinine, decreasing UOP. BP soft, Hgb <7, 1 unit PRBC given.  4/29 Failed SBT, intermittent vent dyssynchrony  Interim History / Subjective:  Afebrile  RN reports intermittent vent dyssynchrony, increased pressures Glucose range 112-130  Vent - PEEP 8, FiO2 30% I/O 140 ml UOP, +2.8L in last 24 hours  Objective   Blood pressure (!) 148/78, pulse (!) 109, temperature 97.6 F (36.4 C), temperature source Axillary, resp. rate 19, height 5\' 9"  (1.753 m), weight 128.2 kg, SpO2 97 %.    Vent Mode: PRVC FiO2 (%):  [30 %-35 %] 35 % Set Rate:  [20 bmp] 20 bmp Vt Set:  [560 mL] 560 mL PEEP:  [8 cmH20] 8 cmH20 Plateau Pressure:  [27 cmH20-34 cmH20] 34 cmH20   Intake/Output Summary (Last 24 hours) at 01/28/2023 7829 Last data filed at 01/28/2023 0600 Gross per 24 hour  Intake 2952.11 ml  Output 140 ml  Net 2812.11 ml   Filed Weights   01/26/23 0500 01/27/23 0545 01/28/23 0500  Weight: 119.6 kg 123.6 kg 128.2 kg    Examination: General: critically ill appearing adult male lying in bed in NAD on  vent HEENT: MM pink/moist, ETT, scleral icterus, pupils =/reactive  Neuro: on fentanyl, intermittently sedate/calm mixed with periods of wakefulness and vent dyssynchrony, follows commands CV: s1s2 RRR, ST on monitor, no m/r/g PULM: non-labored at rest, lungs bilaterally clear with good air entry GI: soft, bsx4 active, tolerating TF 60 ml/hr Extremities: warm/dry, anasarca, 2-3+ pitting edema in BLE up to trunk, dependent  edema in hands Skin: no rashes or lesions GU: foley with rust colored urine   Tracheal Aspirate 4/26 > MSSA   Resolved Hospital Problem list     Assessment & Plan:   Acute Blood Loss Anemia  In setting of hematuria, GIB. Largest contributor suspected to be GI given hx of cirrhosis, prior EGD findings and portal hypertensive gastropathy with varices. No hematemesis this admit.  Had significant hematuria with clots but no blood in foley on insertion - thought by Urology that he could have had a uretheral lesion that foley created tamponade. Varix on EGD but not banded. Hx recurrent gum bleeding per brother.  -appreciate GI, Urology, IR  -follow CBC closely, Hgb stable  -transfuse for hgb <7% or concern for active bleeding  -continue octreotide infusion, BID PPI  -defer duration of octreotide to GI   Decompensated Alcoholic Cirrhosis  ETOH Abuse  Nodular contour on RUQ [place], confirmed cirrhosis on CTA ABD.  Was sober for 8 months prior to admit, relapsed 2 days prior to admit and that was last drink. Maddrey's Discriminant Function Score 68 4/27. MELD 33. Worsening T. Bili/INR. Hepatitis panel negative.  -lactulose, xifaxan PT  -prednisolone 40mg  QD, started 4/27  -follow INR/t.bili trend   Acute Hepatic / Metabolic Encephalopathy  -PAD protocol with fentanyl, PRN versed  -begin precedex infusion  -RASS Goal 0 to -1  -minimize sedation as able  -treat underlying cirrhosis, AKI  -folate, thiamine, MVI  -frequent reorientation   Acute Hypoxic Respiratory Failure in setting of Pulmonary Edema, Possible MSSA Aspiration PNA vs Tracheobronchitis  -PRVC with LTVV  -wean PEEP / FiO2 for sats >90% -follow intermittent CXR  -SBT / WUA as tolerated  -MSSA treated with ceftriaxone  AKI Hyponatremia  Metabolic Acidosis   UNa 45, more consistent with ATN due to shock/ischemic perfusion, hyperbilirubinemia.  HRS less likely (urine Na should be <10) -appreciate Nephrology  -anticipate he  may need CRRT, defer timing to Nephro, no acute indications currently  -continue albumin, midodrine, octreotide  -goal MAP >75 for renal perfusion  -Trend BMP / urinary output -Replace electrolytes as indicated -Avoid nephrotoxic agents, ensure adequate renal perfusion  Hematuria / Clots  Interestingly, no blood in foley once placed, ? Urethral lesion  -follow UOP -appreciate Urology, if need they can place irrigation foley   LLE Wound  3cm x 2cm x 0.4cm LE lesion  -appreciate WOC evaluation  -wound care per NSGY  -will need wound center follow up as outpatient   Hyperglycemia  Steroid contribution  -glucose well controlled -SSI, moderate scale    Best Practice (right click and "Reselect all SmartList Selections" daily)  Diet/type: tubefeeds DVT prophylaxis: SCD GI prophylaxis: PPI Lines: N/A Foley:  Yes, and it is still needed Code Status:  full code Last date of multidisciplinary goals of care discussion: Cousin updated at bedside 4/30.  Brother, Amada Jupiter, updated via phone.  Reviewed nature of critical illness, renal lab findings with concerns for ATN.  Discussed utility of time limited trial of HD but that he is critically ill.  There is some hope of renal recovery if ATN (urine studies  consistent with such) but his liver may be the defining issue in terms of his recovery from ICU illness. He indicates understanding and is in support of time limited trial of HD. If no recovery, he understands that we may be facing discussion of transition to comfort measures.   Amada Jupiter indicates his parents to make any decisions regarding his brothers care in terms of end of life (although he recognizes we are not at that point yet).   Nylan Nevel, father, [phone] Marquavious Nazar, [phone]  Critical care time: 38 minutes    [name], MSN, APRN, NP-C, AGACNP-BC [facility] 01/28/2023, 7:22 AM   Please see [email or web address] for pager details.   From 7A-7P if no  response, please call [phone] After hours, please call ELink [phone]

## 2023-01-28 NOTE — Progress Notes (Signed)
Nutrition Follow-up  DOCUMENTATION CODES:   Not applicable  INTERVENTION:   Vital 1.5 at 60 ml/h (1440 ml per day) Prosource TF20 60 ml TID Provides 2400 kcal, 157 gm protein, 1100 ml free water daily   - Free water flushes per CCM/MD.    - Monitor weight trends.    NUTRITION DIAGNOSIS:   Inadequate oral intake related to inability to eat as evidenced by NPO status. *ongoing  GOAL:   Patient will meet greater than or equal to 90% of their needs *met with TF  MONITOR:   Vent status, Labs, Weight trends  REASON FOR ASSESSMENT:   Ventilator    ASSESSMENT:   37 y.o. male with PMH etoh cirrhosis (sober for 8 months but relapsed recently) who was admitted for weakness and hemoglobin of 3.2  4/25 Admit 4/26 intubated 4/28 TF started  Patient remains intubated and sedated. Did not interact during visit. TF infusing at goal of 46mL/hr during visit.   Family at bedside. Discussed tube feeds and addressed all questions.   Per chart review, patient has not had a BM since admission. RN confirms this is accurate. He is currently receiving miralax, colace, and lactulose.  Per nephrology note, plan to start CRRT today. Received consult from nephrologist for assessment of nutrition status.  Now that patient stating TPN, will further increase tube feeds to further support nutritional needs.   Admit weight: 220# Current weight: 282# Most recent weight likely being affected by fluid. Patient experiencing deep pitting edema and is +13.8L    Medications reviewed and include: Colace, Miralax, Lactulose, MVI, Thiamine, Folic acid  Labs reviewed:  Na 130 Creatinine 5.03   Diet Order:   Diet Order             Diet NPO time specified Except for: Sips with Meds  Diet effective now                   EDUCATION NEEDS:  Education needs have been addressed  Skin:  Skin Assessment: Reviewed RN Assessment  Last BM:  PTA  Height:  Ht Readings from Last 1 Encounters:   01/27/23 5\' 9"  (1.753 m)   Weight:  Wt Readings from Last 1 Encounters:  01/28/23 128.2 kg   Ideal Body Weight:  72.73 kg  BMI:  Body mass index is 41.74 kg/m.  Estimated Nutritional Needs:  Kcal:  2250-2500 kcals Protein:  130-175 grams Fluid:  >/= 2.2L    Shelle Iron RD, LDN For contact information, refer to [facility].

## 2023-01-28 NOTE — Consult Note (Signed)
Consultation Note Date: 01/28/2023   Patient Name: [name]  DOB: 1986/03/28  MRN: [id number]  Age / Sex: 37 y.o., male  PCP: Malka So., MD Referring Physician: Leslye Peer, MD  Reason for Consultation: Establishing goals of care  HPI/Patient Profile: 37 y.o. male    admitted on 01/23/2023    Clinical Assessment and Goals of Care:    37 year old admitted to the ICU at [facility] long hospital critical care medicine service, has serious life limiting illness of alcoholic cirrhosis, history of varices, admitted with severe acute blood loss anemia and hemorrhagic shock.  Patient's hospital course complicated by MSSA tracheobronchitis versus possible aspiration pneumonia, ongoing acute respiratory failure requiring ventilatory support, acute oliguric renal failure, acute on chronic total body volume overload.  Has acute hepatic and metabolic encephalopathy. Palliative medicine team consulted for offering additional support and for following hospital course and overall disease trajectory of illness.  Consult request received.  Chart reviewed.  Discussed with PCCM colleague Ms. Veleta Miners, NP.  Patient seen.  Discussed with nursing support as well.  Call placed but unable to reach brother Amada Jupiter at this time.  See below.  NEXT OF KIN  Brother Parents Significant other Has several cousins as well.   SUMMARY OF RECOMMENDATIONS    Palliative medicine is specialized medical care for people living with serious illness. It focuses on providing relief from the symptoms and stress of a serious illness. The goal is to improve quality of life for both the patient and the family. Goals of care: Broad aims of medical therapy in relation to the patient's values and preferences. Our aim is to provide medical care aimed at enabling patients to achieve the goals that matter most to them, given the circumstances of their  particular medical situation and their constraints.   This patient remains at high risk for ongoing decline and decompensation,inspite of current interventions. He remains at high risk for not surviving this hospitalization due to multiple serious life limiting illnesses right-decompensated alcoholic cirrhosis, ventilator dependent respiratory failure, acute hepatic and metabolic encephalopathy, acute renal failure.  CRRT is being done as a time-limited trial.  Palliative medicine team will remain available for offering additional support and supporting ongoing goals of care discussions.  Thank you for the consult.  Code Status/Advance Care Planning: Full code   Symptom Management:     Palliative Prophylaxis:  Frequent Pain Assessment  Additional Recommendations (Limitations, Scope, Preferences): Full Scope Treatment  Psycho-social/Spiritual:  Desire for further Chaplaincy support:yes Additional Recommendations: Caregiving  Support/Resources  Prognosis:  Unable to determine  Discharge Planning: To Be Determined      Primary Diagnoses: Present on Admission:  Anemia   I have reviewed the medical record, interviewed the patient and family, and examined the patient. The following aspects are pertinent.  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  Tobacco Use   Smoking status: Never   Smokeless tobacco: Never  Vaping Use   Vaping Use: Never used  Substance and Sexual Activity   Alcohol use: Yes    Comment: pt reports one pint of vodka when he drinks   Drug use: No   Sexual activity: Not on file  Other Topics Concern    Not on file  Social History Narrative   Not on file   Social Determinants of Health   Financial Resource Strain: Not on file  Food Insecurity: No Food Insecurity (01/24/2023)   Hunger Vital Sign    Worried About Running Out of Food in the Last Year: Never true    Ran Out of Food in the Last Year: Never true  Transportation Needs: No Transportation Needs (01/24/2023)   PRAPARE - Administrator, Civil Service (Medical): No    Lack of Transportation (Non-Medical): No  Physical Activity: Not on file  Stress: Not on file  Social Connections: Not on file   History reviewed. No pertinent family history. Scheduled Meds:  Chlorhexidine Gluconate Cloth  6 each Topical Daily   docusate  100 mg Per Tube BID   feeding supplement (PROSource TF20)  60 mL Per Tube BID   folic acid  1 mg Per Tube Daily   insulin aspart  0-15 Units Subcutaneous Q4H   lactulose  20 g Per Tube BID   leptospermum manuka honey  1 Application Topical Daily   midodrine  10 mg Per Tube TID WC   multivitamin  15 mL Oral Daily   mouth rinse  15 mL Mouth Rinse Q2H   pantoprazole (PROTONIX) IV  40 mg Intravenous Q12H   polyethylene glycol  17 g Per Tube Daily   prednisoLONE  40 mg Per Tube Daily   rifaximin  550 mg Per Tube BID   thiamine  100 mg Per Tube Daily   Or   thiamine  100 mg Intravenous Daily   Continuous Infusions:   prismasol BGK 4/2.5 400 mL/hr at 01/28/23 1443    prismasol BGK 4/2.5 400 mL/hr at 01/28/23 1442   sodium chloride 10 mL/hr at 01/28/23 1216   dexmedetomidine (PRECEDEX) IV infusion 1.1 mcg/kg/hr (01/28/23 1313)   dextrose 5 % and 0.9% NaCl 25 mL/hr at 01/28/23 1216   feeding supplement (VITAL 1.5 CAL) 60 mL/hr at 01/28/23 1216   fentaNYL infusion INTRAVENOUS 200 mcg/hr (01/28/23 1358)   heparin 10,000 units/ 20 mL infusion syringe 500 Units/hr (01/28/23 1434)   norepinephrine (LEVOPHED) Adult infusion Stopped (01/27/23 1601)   octreotide (SANDOSTATIN) 500 mcg in sodium chloride  0.9 % 250 mL (2 mcg/mL) infusion 50 mcg/hr (01/28/23 1216)   prismasol BGK 4/2.5 1,500 mL/hr at 01/28/23 1443   PRN Meds:.docusate sodium, fentaNYL, heparin, ipratropium-albuterol, midazolam, ondansetron (ZOFRAN) IV, mouth rinse, polyethylene glycol Medications Prior to Admission:  Prior to Admission medications   Medication Sig Start Date End Date Taking? Authorizing Provider  furosemide (LASIX) 40 MG tablet Take 1 tablet (40 mg total) by mouth daily. 11/02/22  Yes Narda Bonds, MD  spironolactone (ALDACTONE) 50 MG tablet Take 1 tablet (50 mg total) by mouth daily. 11/02/22 01/31/23 Yes Narda Bonds, MD  ferrous sulfate 325 (65 FE) MG tablet Take 1 tablet (325 mg total)  by mouth daily with breakfast. Patient not taking: Reported on 10/25/2022 07/30/22 10/25/22  Pollyann Savoy, MD  lactulose (CHRONULAC) 10 GM/15ML solution Take 45 mLs (30 g total) by mouth 3 (three) times daily. Patient not taking: Reported on 01/23/2023 11/02/22 01/31/23  Narda Bonds, MD  pantoprazole (PROTONIX) 40 MG tablet Take 1 tablet (40 mg total) by mouth 2 (two) times daily. Patient not taking: Reported on 01/23/2023 11/02/22 01/31/23  Narda Bonds, MD  propranolol (INDERAL) 10 MG tablet Take 1 tablet (10 mg total) by mouth 2 (two) times daily. Patient not taking: Reported on 01/23/2023 11/02/22 01/31/23  Narda Bonds, MD   No Known Allergies Review of Systems Non verbal Physical Exam Intubated and mechanically ventilated.  Monitor noted Has generalized edema  Vital Signs: BP (!) 151/97   Pulse (!) 109   Temp 98.5 F (36.9 C) (Axillary)   Resp 17   Ht 5\' 9"  (1.753 m)   Wt 128.2 kg   SpO2 94%   BMI 41.74 kg/m  Pain Scale: CPOT POSS *See Group Information*: S-Acceptable,Sleep, easy to arouse Pain Score: 2    SpO2: SpO2: 94 % O2 Device:SpO2: 94 % O2 Flow Rate: .O2 Flow Rate (L/min): 4 L/min  IO: Intake/output summary:  Intake/Output Summary (Last 24 hours) at 01/28/2023 1502 Last data filed at  01/28/2023 1216 Gross per 24 hour  Intake 3148.83 ml  Output 180 ml  Net 2968.83 ml    LBM: Last BM Date :  (PTA) Baseline Weight: Weight: 99.8 kg Most recent weight: Weight: 128.2 kg     Palliative Assessment/Data:   PPS 30%  Time In:  1415 Time Out:  1515 Time Total:  60  Greater than 50%  of this time was spent counseling and coordinating care related to the above assessment and plan.  Signed by: Rosalin Hawking, MD   Please contact Palliative Medicine Team phone at [phone] for questions and concerns.  For individual provider: See Loretha Stapler

## 2023-01-28 NOTE — Procedures (Signed)
Central Venous Catheter Insertion Procedure Note  [name]  [id number]  11/08/1985  Date:01/28/23  Time:2:25 PM   Provider Performing:[name]   Procedure: Insertion of Non-tunneled Central Venous Catheter(36556)with US guidance (40981)    Indication(s) Hemodialysis  Consent Risks of the procedure as well as the alternatives and risks of each were explained to the patient and/or caregiver.  Consent for the procedure was obtained and is signed in the bedside chart  Anesthesia Topical only with 1% lidocaine   Timeout Verified patient identification, verified procedure, site/side was marked, verified correct patient position, special equipment/implants available, medications/allergies/relevant history reviewed, required imaging and test results available.  Sterile Technique Maximal sterile technique including full sterile barrier drape, hand hygiene, sterile gown, sterile gloves, mask, hair covering, sterile ultrasound probe cover (if used).  Procedure Description Area of catheter insertion was cleaned with chlorhexidine and draped in sterile fashion.   With real-time ultrasound guidance a HD catheter was placed into the right internal jugular vein.  Nonpulsatile blood flow and easy flushing noted in all ports.  The catheter was sutured in place and sterile dressing applied.    R IJ with wire in vessel.    Complications/Tolerance None; patient tolerated the procedure well. Chest X-ray is ordered to verify placement for internal jugular or subclavian cannulation.  Chest x-ray is not ordered for femoral cannulation.  EBL Minimal  Specimen(s) None   Joshua Brim, MSN, APRN, NP-C, AGACNP-BC [facility] 01/28/2023, 2:34 PM   Please see [email or web address] for pager details.   From 7A-7P if no response, please call [phone] After hours, please call ELink [phone]

## 2023-01-28 NOTE — Progress Notes (Signed)
[facility] Progress Note  SUBJECTIVE:   Interval history: [name] was seen and evaluated today at bedside.  Sedated after having had line placed for renal replacement therapy.  Remains intubated.  Per nursing no bowel movement today.  Scant amount of urine output.  Off vasopressor therapy.  Abdomen appears somewhat distended, has diffuse edema in bilateral lower extremities and abdomen.  Past Medical History:  Diagnosis Date   Liver cirrhosis, alcoholic (HCC)    Portal hypertension (HCC)    Splenomegaly, congestive, chronic    Past Surgical History:  Procedure Laterality Date   COSMETIC SURGERY     ESOPHAGOGASTRODUODENOSCOPY N/A 04/26/2021   Procedure: ESOPHAGOGASTRODUODENOSCOPY (EGD);  Surgeon: Willis Modena, MD;  Location: [facility];  Service: Endoscopy;  Laterality: N/A;   ESOPHAGOGASTRODUODENOSCOPY (EGD) WITH PROPOFOL N/A 11/01/2022   Procedure: ESOPHAGOGASTRODUODENOSCOPY (EGD) WITH PROPOFOL;  Surgeon: Vida Rigger, MD;  Location: [facility];  Service: Gastroenterology;  Laterality: N/A;   ESOPHAGOGASTRODUODENOSCOPY (EGD) WITH PROPOFOL N/A 01/25/2023   Procedure: ESOPHAGOGASTRODUODENOSCOPY (EGD) WITH PROPOFOL;  Surgeon: Kathi Der, MD;  Location: [facility];  Service: Gastroenterology;  Laterality: N/A;   MANDIBLE FRACTURE SURGERY     Current Facility-Administered Medications  Medication Dose Route Frequency Provider Last Rate Last Admin    prismasol BGK 4/2.5 infusion   CRRT Continuous Delano Metz, MD 400 mL/hr at 01/28/23 1443 New Bag at 01/28/23 1443    prismasol BGK 4/2.5 infusion   CRRT Continuous Delano Metz, MD 400 mL/hr at 01/28/23 1442 New Bag at 01/28/23 1442   0.9 %  sodium chloride infusion  250 mL Intravenous Continuous Kalman Shan, MD 10 mL/hr at 01/28/23 1216 Infusion Verify at 01/28/23 1216   Chlorhexidine Gluconate Cloth 2 % PADS 6 each  6 each Topical Daily Hunsucker, Lesia Sago, MD   6 each at 01/28/23 0911    dexmedetomidine (PRECEDEX) 400 MCG/100ML (4 mcg/mL) infusion  0-1.2 mcg/kg/hr Intravenous Titrated Ollis, Brandi L, NP 35.3 mL/hr at 01/28/23 1313 1.1 mcg/kg/hr at 01/28/23 1313   dextrose 5 %-0.9 % sodium chloride infusion   Intravenous Continuous Annie Sable, MD 25 mL/hr at 01/28/23 1216 Infusion Verify at 01/28/23 1216   docusate (COLACE) 50 MG/5ML liquid 100 mg  100 mg Per Tube BID Simonne Martinet, NP   100 mg at 01/28/23 0933   docusate sodium (COLACE) capsule 100 mg  100 mg Oral BID PRN Hunsucker, Lesia Sago, MD       feeding supplement (PROSource TF20) liquid 60 mL  60 mL Per Tube BID Kalman Shan, MD   60 mL at 01/28/23 0933   feeding supplement (VITAL 1.5 CAL) liquid 1,000 mL  1,000 mL Per Tube Continuous Kalman Shan, MD 60 mL/hr at 01/28/23 1216 Infusion Verify at 01/28/23 1216   fentaNYL (SUBLIMAZE) bolus via infusion 50-100 mcg  50-100 mcg Intravenous Q15 min PRN Simonne Martinet, NP   100 mcg at 01/28/23 1113   fentaNYL in NS (80mcg/ml) infusion-PREMIX  50-200 mcg/hr Intravenous Continuous Simonne Martinet, NP 20 mL/hr at 01/28/23 1358 200 mcg/hr at 01/28/23 1358   folic acid (FOLVITE) tablet 1 mg  1 mg Per Tube Daily Omar Person, MD   1 mg at 01/28/23 0934   heparin 10,000 units/ 20 mL infusion syringe  500-1,000 Units/hr CRRT Continuous Delano Metz, MD 1 mL/hr at 01/28/23 1434 500 Units/hr at 01/28/23 1434   heparin injection 1,000-6,000 Units  1,000-6,000 Units CRRT PRN Delano Metz, MD       insulin aspart (novoLOG) injection  0-15 Units  0-15 Units Subcutaneous Q4H Hunsucker, Lesia Sago, MD   2 Units at 01/27/23 2359   ipratropium-albuterol (DUONEB) 0.5-2.5 (3) MG/3ML nebulizer solution 3 mL  3 mL Nebulization Q4H PRN Omar Person, MD   3 mL at 01/28/23 1054   lactulose (CHRONULAC) 10 GM/15ML solution 20 g  20 g Per Tube BID Omar Person, MD   20 g at 01/28/23 0933   leptospermum manuka honey (MEDIHONEY) paste 1 Application  1  Application Topical Daily Hunsucker, Lesia Sago, MD   1 Application at 01/27/23 2209   midazolam (VERSED) injection 2 mg  2 mg Intravenous Q2H PRN [name] L, NP   2 mg at 01/28/23 1313   midodrine (PROAMATINE) tablet 10 mg  10 mg Per Tube TID WC Omar Person, MD   10 mg at 01/28/23 0741   multivitamin liquid 15 mL  15 mL Oral Daily Ollis, Brandi L, NP   15 mL at 01/28/23 0933   norepinephrine (LEVOPHED) 4mg  in (0.016 mg/mL) premix infusion  2-10 mcg/min Intravenous Titrated Omar Person, MD   Stopped at 01/27/23 1601   octreotide (SANDOSTATIN) 500 mcg in sodium chloride 0.9 % 250 mL (2 mcg/mL) infusion  50 mcg/hr Intravenous Continuous Hunsucker, Lesia Sago, MD 25 mL/hr at 01/28/23 1216 50 mcg/hr at 01/28/23 1216   ondansetron (ZOFRAN) injection 4 mg  4 mg Intravenous Q6H PRN Hunsucker, Lesia Sago, MD       Oral care mouth rinse  15 mL Mouth Rinse Q2H Omar Person, MD   15 mL at 01/28/23 1426   Oral care mouth rinse  15 mL Mouth Rinse PRN Omar Person, MD       pantoprazole (PROTONIX) injection 40 mg  40 mg Intravenous Q12H Hunsucker, Lesia Sago, MD   40 mg at 01/28/23 0933   polyethylene glycol (MIRALAX / GLYCOLAX) packet 17 g  17 g Per Tube Daily Simonne Martinet, NP   17 g at 01/28/23 0933   polyethylene glycol (MIRALAX / GLYCOLAX) packet 17 g  17 g Per Tube Daily PRN Omar Person, MD       prednisoLONE (ORAPRED) 15 MG/5ML solution 40 mg  40 mg Per Tube Daily Kalman Shan, MD   40 mg at 01/28/23 7829   prismasol BGK 4/2.5 infusion   CRRT Continuous Delano Metz, MD 1,500 mL/hr at 01/28/23 1443 New Bag at 01/28/23 1443   rifaximin (XIFAXAN) tablet 550 mg  550 mg Per Tube BID Omar Person, MD   550 mg at 01/28/23 5621   thiamine (VITAMIN B1) tablet 100 mg  100 mg Per Tube Daily Omar Person, MD   100 mg at 01/28/23 3086   Or   thiamine (VITAMIN B1) injection 100 mg  100 mg Intravenous Daily Omar Person, MD   100 mg at 01/27/23 1029    Allergies as of 01/23/2023   (No Known Allergies)   Review of Systems:  Review of Systems  Reason unable to perform ROS: Intubated, sedated.    OBJECTIVE:   Temp:  [96.9 F (36.1 C)-98.5 F (36.9 C)] 98.5 F (36.9 C) (04/30 1200) Pulse Rate:  [66-148] 109 (04/30 1358) Resp:  [13-24] 17 (04/30 1358) BP: (134-178)/(58-115) 151/97 (04/30 1358) SpO2:  [84 %-100 %] 94 % (04/30 1358) FiO2 (%):  [30 %-35 %] 35 % (04/30 1200) Weight:  [128.2 kg] 128.2 kg (04/30 0500) Last BM Date :  (PTA) Physical Exam Constitutional:  General: He is not in acute distress.    Appearance: He is ill-appearing. He is not toxic-appearing.     Interventions: He is sedated and intubated.  Cardiovascular:     Rate and Rhythm: Tachycardia present.  Pulmonary:     Effort: No respiratory distress. He is intubated.     Breath sounds: Normal breath sounds.  Abdominal:     General: There is distension.     Comments: Hypoactive bowel sounds, abdominal wall edema  Musculoskeletal:     Right lower leg: Edema present.     Left lower leg: Edema present.     Labs: Recent Labs    01/26/23 0320 01/27/23 0529 01/28/23 0555  WBC 6.0 6.3 6.3  HGB 6.8* 7.8* 7.4*  HCT 21.7* 25.0* 24.0*  PLT 166 179 154   BMET Recent Labs    01/27/23 0529 01/27/23 1548 01/28/23 0555  NA 130* 130* 130*  K 4.1 3.9 4.4  CL 97* 98 99  CO2 20* 18* 21*  GLUCOSE 134* 119* 127*  BUN 38* 45* 54*  CREATININE 4.16* 4.57* 5.03*  CALCIUM 8.7* 8.6* 9.1   LFT Recent Labs    01/28/23 0555  PROT 9.0*  ALBUMIN 3.6  AST 35  ALT 12  ALKPHOS 66  BILITOT 15.8*   PT/INR Recent Labs    01/26/23 0320 01/27/23 0529  LABPROT 30.0* 27.2*  INR 2.9* 2.6*   Diagnostic imaging: DG Chest 1 View  Result Date: 01/28/2023 CLINICAL DATA:  Central line placement. EXAM: CHEST  1 VIEW COMPARISON:  January 27, 2023. FINDINGS: Stable cardiomegaly with central pulmonary vascular congestion. Bilateral lung opacities are noted concerning  for pulmonary edema. Endotracheal and nasogastric tubes are in grossly good position. Right internal jugular catheter is noted with tip in expected position of the SVC. Bony thorax is unremarkable. IMPRESSION: Stable cardiomegaly with bilateral lung opacities most consistent with edema. Right internal jugular catheter is noted with tip in expected position of SVC. Electronically Signed   By: Lupita Raider M.D.   On: 01/28/2023 14:24   DG CHEST PORT 1 VIEW  Result Date: 01/27/2023 CLINICAL DATA:  Hypoxia. EXAM: PORTABLE CHEST 1 VIEW COMPARISON:  01/26/2023. FINDINGS: 1419 hours. Endotracheal tube terminates in the midthoracic trachea. Enteric tube side port projects over the stomach. Stable cardiomegaly and mediastinal contours. Slightly improved pulmonary edema with stable small bilateral pleural effusions and adjacent bibasilar atelectasis. No pneumothorax. IMPRESSION: 1. Slightly improved pulmonary edema with stable small bilateral pleural effusions. 2. Stable support apparatus. Electronically Signed   By: Orvan Falconer M.D.   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 esophageal varices and hepatic encephalopathy             -Concern for hepatorenal syndrome             -Not a candidate for TIPS given elevated INR and unusual vascular anatomy  -MELD 3.0: 40 at 01/28/2023  5:55 AM MELD-Na: 41 at 01/28/2023  5:55 AM Calculated from: Serum Creatinine: 5.03 mg/dL (Using max of 3 mg/dL) at 1/61/0960  4:54 AM Serum Sodium: 130 mmol/L at 01/28/2023  5:55 AM Total Bilirubin: 15.8 mg/dL at 0/98/1191  4:78 AM Serum Albumin: 3.6 g/dL (Using max of 3.5 g/dL) at 2/95/6213  0:86 AM INR(ratio): 2.6 at 01/27/2023  5:29 AM Age at listing (hypothetical): 37 years Sex: Male at 01/28/2023  5:55 AM Coagulopathy  Normocytic anemia Hematuria  Acute kidney injury, worsening, planning to start CRRT Acute hypoxemic respiratory  failure, intubated  PLAN: -Continue lactulose  and Xifaxan -Continue prednisolone -Continue octreotide and albumin -Midodrine is on hold given hypertension -Follow nephrology recommendations, CRRT -Remains with poor prognosis given multiorgan failure and advanced age liver disease -No family at bedside today   LOS: 5 days   Liliane Shi, [facility]

## 2023-01-29 ENCOUNTER — Inpatient Hospital Stay (HOSPITAL_COMMUNITY): Payer: Medicaid Other

## 2023-01-29 DIAGNOSIS — Z515 Encounter for palliative care: Secondary | ICD-10-CM | POA: Diagnosis not present

## 2023-01-29 DIAGNOSIS — Z7189 Other specified counseling: Secondary | ICD-10-CM | POA: Diagnosis not present

## 2023-01-29 DIAGNOSIS — J9601 Acute respiratory failure with hypoxia: Secondary | ICD-10-CM | POA: Diagnosis not present

## 2023-01-29 LAB — RENAL FUNCTION PANEL
Albumin: 3.5 g/dL (ref 3.5–5.0)
Albumin: 3.4 g/dL — ABNORMAL LOW (ref 3.5–5.0)
Anion gap: 11 (ref 5–15)
Anion gap: 11 (ref 5–15)
BUN: 49 mg/dL — ABNORMAL HIGH (ref 6–20)
BUN: 46 mg/dL — ABNORMAL HIGH (ref 6–20)
CO2: 20 mmol/L — ABNORMAL LOW (ref 22–32)
CO2: 20 mmol/L — ABNORMAL LOW (ref 22–32)
Calcium: 8.8 mg/dL — ABNORMAL LOW (ref 8.9–10.3)
Calcium: 8.4 mg/dL — ABNORMAL LOW (ref 8.9–10.3)
Chloride: 100 mmol/L (ref 98–111)
Chloride: 102 mmol/L (ref 98–111)
Creatinine, Ser: 3.86 mg/dL — ABNORMAL HIGH (ref 0.61–1.24)
Creatinine, Ser: 3.37 mg/dL — ABNORMAL HIGH (ref 0.61–1.24)
GFR, Estimated: 19 mL/min — ABNORMAL LOW (ref >60)
GFR, Estimated: 23 mL/min — ABNORMAL LOW (ref >60)
Glucose, Bld: 135 mg/dL — ABNORMAL HIGH (ref 70–99)
Glucose, Bld: 116 mg/dL — ABNORMAL HIGH (ref 70–99)
Phosphorus: 5.8 mg/dL — ABNORMAL HIGH (ref 2.5–4.6)
Phosphorus: 4.3 mg/dL (ref 2.5–4.6)
Potassium: 5.3 mmol/L — ABNORMAL HIGH (ref 3.5–5.1)
Potassium: 4.8 mmol/L (ref 3.5–5.1)
Sodium: 131 mmol/L — ABNORMAL LOW (ref 135–145)
Sodium: 133 mmol/L — ABNORMAL LOW (ref 135–145)

## 2023-01-29 LAB — GLUCOSE, CAPILLARY
Glucose-Capillary: 123 mg/dL — ABNORMAL HIGH (ref 70–99)
Glucose-Capillary: 124 mg/dL — ABNORMAL HIGH (ref 70–99)
Glucose-Capillary: 124 mg/dL — ABNORMAL HIGH (ref 70–99)
Glucose-Capillary: 131 mg/dL — ABNORMAL HIGH (ref 70–99)
Glucose-Capillary: 137 mg/dL — ABNORMAL HIGH (ref 70–99)
Glucose-Capillary: 165 mg/dL — ABNORMAL HIGH (ref 70–99)

## 2023-01-29 LAB — CBC
HCT: 27.3 % — ABNORMAL LOW (ref 39.0–52.0)
Hemoglobin: 8.1 g/dL — ABNORMAL LOW (ref 13.0–17.0)
MCH: 26.7 pg (ref 26.0–34.0)
MCHC: 29.7 g/dL — ABNORMAL LOW (ref 30.0–36.0)
MCV: 90.1 fL (ref 80.0–100.0)
Platelets: 147 10*3/uL — ABNORMAL LOW (ref 150–400)
RBC: 3.03 MIL/uL — ABNORMAL LOW (ref 4.22–5.81)
RDW: 20.3 % — ABNORMAL HIGH (ref 11.5–15.5)
WBC: 6.3 10*3/uL (ref 4.0–10.5)
nRBC: 1.6 % — ABNORMAL HIGH (ref 0.0–0.2)

## 2023-01-29 LAB — HEPATIC FUNCTION PANEL
ALT: 17 U/L (ref 0–44)
AST: 49 U/L — ABNORMAL HIGH (ref 15–41)
Albumin: 3.5 g/dL (ref 3.5–5.0)
Alkaline Phosphatase: 72 U/L (ref 38–126)
Bilirubin, Direct: 7.2 mg/dL — ABNORMAL HIGH (ref 0.0–0.2)
Indirect Bilirubin: 8.1 mg/dL
Total Bilirubin: 15.3 mg/dL — ABNORMAL HIGH (ref 0.3–1.2)
Total Protein: 9.3 g/dL — ABNORMAL HIGH (ref 6.5–8.1)

## 2023-01-29 LAB — MAGNESIUM: Magnesium: 2.6 mg/dL — ABNORMAL HIGH (ref 1.7–2.4)

## 2023-01-29 LAB — APTT: aPTT: 44 seconds — ABNORMAL HIGH (ref 24–36)

## 2023-01-29 MED ORDER — PRISMASOL BGK 0/2.5 32-2.5 MEQ/L EC SOLN
Status: DC
  Filled 2023-01-29 (×24): qty 5000

## 2023-01-29 MED ORDER — MIDODRINE HCL 5 MG PO TABS
5.0000 mg | ORAL_TABLET | Freq: Three times a day (TID) | ORAL | Status: DC
  Administered 2023-01-29 – 2023-02-03 (×16): 5 mg
  Filled 2023-01-29 (×16): qty 1

## 2023-01-29 MED ORDER — CHLORHEXIDINE GLUCONATE CLOTH 2 % EX PADS
6.0000 | MEDICATED_PAD | Freq: Every day | CUTANEOUS | Status: DC
  Administered 2023-01-30 – 2023-02-03 (×5): 6 via TOPICAL

## 2023-01-29 NOTE — Progress Notes (Signed)
[facility] Progress Note  SUBJECTIVE:   Interval history: [name] was seen and evaluated today at bedside. Remains intubated, not interactive on exam today.  Abdomen remains distended.  Has diffuse edema.  Per nursing no bowel movement overnight.  Having roughly 10 cc of urine output per day, on CRRT.  Abdominal x-ray today showed dilated gas-filled loops of small and large bowel with increased distention, ileus versus partial/early obstruction.  Past Medical History:  Diagnosis Date   Liver cirrhosis, alcoholic (HCC)    Portal hypertension (HCC)    Splenomegaly, congestive, chronic    Past Surgical History:  Procedure Laterality Date   COSMETIC SURGERY     ESOPHAGOGASTRODUODENOSCOPY N/A 04/26/2021   Procedure: ESOPHAGOGASTRODUODENOSCOPY (EGD);  Surgeon: Willis Modena, MD;  Location: [facility];  Service: Endoscopy;  Laterality: N/A;   ESOPHAGOGASTRODUODENOSCOPY (EGD) WITH PROPOFOL N/A 11/01/2022   Procedure: ESOPHAGOGASTRODUODENOSCOPY (EGD) WITH PROPOFOL;  Surgeon: Vida Rigger, MD;  Location: [facility];  Service: Gastroenterology;  Laterality: N/A;   ESOPHAGOGASTRODUODENOSCOPY (EGD) WITH PROPOFOL N/A 01/25/2023   Procedure: ESOPHAGOGASTRODUODENOSCOPY (EGD) WITH PROPOFOL;  Surgeon: Kathi Der, MD;  Location: [facility];  Service: Gastroenterology;  Laterality: N/A;   MANDIBLE FRACTURE SURGERY     Current Facility-Administered Medications  Medication Dose Route Frequency Provider Last Rate Last Admin    prismasol BGK 4/2.5 infusion   CRRT Continuous Delano Metz, MD 400 mL/hr at 01/29/23 0335 New Bag at 01/29/23 0335    prismasol BGK 4/2.5 infusion   CRRT Continuous Delano Metz, MD 400 mL/hr at 01/29/23 0335 New Bag at 01/29/23 0335   0.9 %  sodium chloride infusion  250 mL Intravenous Continuous Kalman Shan, MD   Stopped at 01/28/23 1435   Chlorhexidine Gluconate Cloth 2 % PADS 6 each  6 each Topical Daily Hunsucker, Lesia Sago, MD   6 each at  01/28/23 0911   dexmedetomidine (PRECEDEX) 400 MCG/100ML (4 mcg/mL) infusion  0-1.2 mcg/kg/hr Intravenous Titrated Ollis, Brandi L, NP 25.6 mL/hr at 01/29/23 1300 0.8 mcg/kg/hr at 01/29/23 1300   docusate (COLACE) 50 MG/5ML liquid 100 mg  100 mg Per Tube BID Simonne Martinet, NP   100 mg at 01/29/23 1011   feeding supplement (PROSource TF20) liquid 60 mL  60 mL Per Tube TID Leslye Peer, MD   60 mL at 01/29/23 1010   feeding supplement (VITAL 1.5 CAL) liquid 1,000 mL  1,000 mL Per Tube Continuous Kalman Shan, MD 60 mL/hr at 01/29/23 1300 Infusion Verify at 01/29/23 1300   fentaNYL (SUBLIMAZE) bolus via infusion 50-100 mcg  50-100 mcg Intravenous Q15 min PRN Simonne Martinet, NP   100 mcg at 01/29/23 1108   fentaNYL in NS (49mcg/ml) infusion-PREMIX  50-200 mcg/hr Intravenous Continuous Simonne Martinet, NP 20 mL/hr at 01/29/23 1300 200 mcg/hr at 01/29/23 1300   folic acid (FOLVITE) tablet 1 mg  1 mg Per Tube Daily Omar Person, MD   1 mg at 01/29/23 1011   heparin 10,000 units/ 20 mL infusion syringe  500-1,000 Units/hr CRRT Continuous Delano Metz, MD 1 mL/hr at 01/29/23 0900 500 Units/hr at 01/29/23 0900   heparin injection 1,000-6,000 Units  1,000-6,000 Units CRRT PRN Delano Metz, MD       insulin aspart (novoLOG) injection 0-15 Units  0-15 Units Subcutaneous Q4H Hunsucker, Lesia Sago, MD   2 Units at 01/29/23 1225   ipratropium-albuterol (DUONEB) 0.5-2.5 (3) MG/3ML nebulizer solution 3 mL  3 mL Nebulization Q4H PRN Omar Person, MD   3  mL at 01/28/23 1054   lactulose (CHRONULAC) 10 GM/15ML solution 20 g  20 g Per Tube BID Omar Person, MD   20 g at 01/29/23 1011   leptospermum manuka honey (MEDIHONEY) paste 1 Application  1 Application Topical Daily Hunsucker, Lesia Sago, MD   1 Application at 01/29/23 0502   midazolam (VERSED) injection 2 mg  2 mg Intravenous Q2H PRN [name] L, NP   2 mg at 01/29/23 1109   midodrine (PROAMATINE) tablet 5 mg  5 mg  Per Tube TID WC Desai, Rahul P, PA-C   5 mg at 01/29/23 1206   multivitamin liquid 15 mL  15 mL Oral Daily Ollis, Brandi L, NP   15 mL at 01/29/23 1011   octreotide (SANDOSTATIN) 500 mcg in sodium chloride 0.9 % 250 mL (2 mcg/mL) infusion  50 mcg/hr Intravenous Continuous Hunsucker, Lesia Sago, MD 25 mL/hr at 01/29/23 1300 50 mcg/hr at 01/29/23 1300   ondansetron (ZOFRAN) injection 4 mg  4 mg Intravenous Q6H PRN Hunsucker, Lesia Sago, MD       Oral care mouth rinse  15 mL Mouth Rinse Q2H Omar Person, MD   15 mL at 01/29/23 1208   Oral care mouth rinse  15 mL Mouth Rinse PRN Omar Person, MD       pantoprazole (PROTONIX) injection 40 mg  40 mg Intravenous Q12H Hunsucker, Lesia Sago, MD   40 mg at 01/29/23 1010   polyethylene glycol (MIRALAX / GLYCOLAX) packet 17 g  17 g Per Tube Daily Simonne Martinet, NP   17 g at 01/29/23 1011   polyethylene glycol (MIRALAX / GLYCOLAX) packet 17 g  17 g Per Tube Daily PRN Omar Person, MD       prednisoLONE (ORAPRED) 15 MG/5ML solution 40 mg  40 mg Per Tube Daily Kalman Shan, MD   40 mg at 01/29/23 1011   prismasol BGK 2/2.5 dialysis solution   CRRT Continuous Delano Metz, MD       rifaximin [employer]) tablet 550 mg  550 mg Per Tube BID Omar Person, MD   550 mg at 01/29/23 1011   thiamine (VITAMIN B1) tablet 100 mg  100 mg Per Tube Daily Omar Person, MD   100 mg at 01/29/23 1011   Or   thiamine (VITAMIN B1) injection 100 mg  100 mg Intravenous Daily Omar Person, MD   100 mg at 01/27/23 1029   Allergies as of 01/23/2023   (No Known Allergies)   Review of Systems:  Review of Systems  Unable to perform ROS: Critical illness    OBJECTIVE:   Temp:  [98.6 F (37 C)-100.3 F (37.9 C)] 100.3 F (37.9 C) (05/01 1200) Pulse Rate:  [96-125] 125 (05/01 1300) Resp:  [8-23] 19 (05/01 1300) BP: (125-163)/(55-102) 135/55 (05/01 1300) SpO2:  [92 %-98 %] 96 % (05/01 1300) FiO2 (%):  [30 %-40 %] 40 % (05/01 1110) Weight:   [126.9 kg] 126.9 kg (05/01 0500) Last BM Date :  (PTA) Physical Exam Constitutional:      Interventions: He is sedated and intubated.  Cardiovascular:     Rate and Rhythm: Tachycardia present.  Pulmonary:     Effort: He is intubated.     Comments: Coarse anterior breath sounds, intubated Abdominal:     General: Bowel sounds are normal. There is distension.     Tenderness: There is no abdominal tenderness.     Comments: Abdominal wall edema  Musculoskeletal:  Right lower leg: Edema present.     Left lower leg: Edema present.     Labs: Recent Labs    01/27/23 0529 01/28/23 0555 01/29/23 0338  WBC 6.3 6.3 6.3  HGB 7.8* 7.4* 8.1*  HCT 25.0* 24.0* 27.3*  PLT 179 154 147*   BMET Recent Labs    01/28/23 0555 01/28/23 1553 01/29/23 0343  NA 130* 130* 131*  K 4.4 5.3* 5.3*  CL 99 99 100  CO2 21* 21* 20*  GLUCOSE 127* 128* 135*  BUN 54* 58* 49*  CREATININE 5.03* 4.93* 3.86*  CALCIUM 9.1 8.9 8.8*   LFT Recent Labs    01/29/23 0338 01/29/23 0343  PROT 9.3*  --   ALBUMIN 3.5 3.5  AST 49*  --   ALT 17  --   ALKPHOS 72  --   BILITOT 15.3*  --   BILIDIR 7.2*  --   IBILI 8.1  --    PT/INR Recent Labs    01/27/23 0529  LABPROT 27.2*  INR 2.6*   Diagnostic imaging: DG Abd 1 View  Result Date: 01/29/2023 CLINICAL DATA:  NG tube placement. EXAM: ABDOMEN - 1 VIEW COMPARISON:  01/29/2023 at 0703 hours. FINDINGS: 0954 hours. Enteric tube tip and side port project over the stomach. Unchanged gaseous distention of the bowel. IMPRESSION: Enteric tube tip and side port project over the stomach. Unchanged gaseous distention of the bowel. Electronically Signed   By: Orvan Falconer M.D.   On: 01/29/2023 10:06   DG CHEST PORT 1 VIEW  Result Date: 01/29/2023 CLINICAL DATA:  Provided history: Acute respiratory failure with hypoxia. EXAM: PORTABLE CHEST 1 VIEW COMPARISON:  Prior chest radiographs 01/28/2023 and earlier. FINDINGS: Right IJ approach catheter with tip at the  level of the mid to lower SVC. ET tube present with tip at the superior margin of the clavicular heads. An enteric tube is not well delineated on the level of the distal esophagus. Cardiomegaly. Diffuse patchy bilateral airspace opacities, similar to the prior examination of 01/28/2023. A small left pleural effusion may be present. No evidence of pneumothorax. No acute osseous abnormality identified. Impression #1 will be called to the ordering clinician or representative by the Radiologist Assistant, and communication documented in the PACS or [employer]. IMPRESSION: 1. Support apparatus as described. Notably, the enteric tube is not well delineated beyond the level of the distal esophagus. Consider abdominal radiographs to better delineate tube position. Also of note, the ET tube terminates at the level of the superior margin of the clavicular heads. Consider advancement. 2. Diffuse patchy bilateral airspace opacities, similar to the prior examination of 01/28/2023. Findings likely reflect pulmonary edema. However, pneumonia cannot be excluded and clinical correlation is recommended. 3. Possible small left pleural effusion. 4. Cardiomegaly. Electronically Signed   By: Jackey Loge D.O.   On: 01/29/2023 08:39   DG Abd 1 View  Result Date: 01/29/2023 CLINICAL DATA:  Abdominal distention EXAM: ABDOMEN - 1 VIEW COMPARISON:  Abdominal radiograph dated January 25, 2023 FINDINGS: NG/OG tube tip and side port are in the stomach. Dilated gas-filled loops of small and large bowel, increased central when compared with the prior. No acute osseous abnormality. IMPRESSION: 1. NG/OG tube tip and side port are in the stomach. 2. Dilated gas-filled loops of small and large bowel with increased distention when compared with the prior, findings may be due to ileus or partial/early obstruction. Electronically Signed   By: Allegra Lai M.D.   On: 01/29/2023 08:15  DG Chest 1 View  Result Date: 01/28/2023 CLINICAL DATA:   Central line placement. EXAM: CHEST  1 VIEW COMPARISON:  January 27, 2023. FINDINGS: Stable cardiomegaly with central pulmonary vascular congestion. Bilateral lung opacities are noted concerning for pulmonary edema. Endotracheal and nasogastric tubes are in grossly good position. Right internal jugular catheter is noted with tip in expected position of the SVC. Bony thorax is unremarkable. IMPRESSION: Stable cardiomegaly with bilateral lung opacities most consistent with edema. Right internal jugular catheter is noted with tip in expected position of SVC. Electronically Signed   By: Lupita Raider M.D.   On: 01/28/2023 14:24   DG CHEST PORT 1 VIEW  Result Date: 01/27/2023 CLINICAL DATA:  Hypoxia. EXAM: PORTABLE CHEST 1 VIEW COMPARISON:  01/26/2023. FINDINGS: 1419 hours. Endotracheal tube terminates in the midthoracic trachea. Enteric tube side port projects over the stomach. Stable cardiomegaly and mediastinal contours. Slightly improved pulmonary edema with stable small bilateral pleural effusions and adjacent bibasilar atelectasis. No pneumothorax. IMPRESSION: 1. Slightly improved pulmonary edema with stable small bilateral pleural effusions. 2. Stable support apparatus. Electronically Signed   By: Orvan Falconer M.D.   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 esophageal varices and hepatic encephalopathy             -HRS less likely per Nephrology             -Not a candidate for TIPS given elevated INR and unusual vascular anatomy             -MELD 3.0: 40 at 01/29/2023  3:43 AM Possible ileus versus early bowel obstruction Coagulopathy  Normocytic anemia Hematuria  Acute kidney injury, worsening, started CRRT Acute hypoxemic respiratory failure, intubated   PLAN: -Continue supportive care -Continue lactulose and Xifaxan -Continue prednisolone -Continue octreotide and albumin -Now off vasopressors -Follow-up nephrology recommendations, CRRT -Poor  prognosis, no family at bedside today   LOS: 6 days   Liliane Shi, [facility]

## 2023-01-29 NOTE — Progress Notes (Signed)
Daily Progress Note   Patient Name: [name]       Date: 01/29/2023 DOB: Jan 20, 1986  Age: 37 y.o. MRN#: [id number] Attending Physician: Leslye Peer, MD Primary Care Physician: Malka So., MD Admit Date: 01/23/2023  Reason for Consultation/Follow-up: Establishing goals of care  Subjective: Remains with ventilatory support, undergoing time-limited trial of CVVHD in hopes for renal recovery.  Father present at bedside.  Chart reviewed.  Patient's father and mother's phone numbers added onto the demographics section of the chart.  Length of Stay: 6  Current Medications: Scheduled Meds:   Chlorhexidine Gluconate Cloth  6 each Topical Daily   docusate  100 mg Per Tube BID   feeding supplement (PROSource TF20)  60 mL Per Tube TID   folic acid  1 mg Per Tube Daily   insulin aspart  0-15 Units Subcutaneous Q4H   lactulose  20 g Per Tube BID   leptospermum manuka honey  1 Application Topical Daily   midodrine  5 mg Per Tube TID WC   multivitamin  15 mL Oral Daily   mouth rinse  15 mL Mouth Rinse Q2H   pantoprazole (PROTONIX) IV  40 mg Intravenous Q12H   polyethylene glycol  17 g Per Tube Daily   prednisoLONE  40 mg Per Tube Daily   rifaximin  550 mg Per Tube BID   thiamine  100 mg Per Tube Daily   Or   thiamine  100 mg Intravenous Daily    Continuous Infusions:   prismasol BGK 4/2.5 400 mL/hr at 01/29/23 0335    prismasol BGK 4/2.5 400 mL/hr at 01/29/23 0335   sodium chloride Stopped (01/28/23 1435)   dexmedetomidine (PRECEDEX) IV infusion 0.8 mcg/kg/hr (01/29/23 1200)   feeding supplement (VITAL 1.5 CAL) 60 mL/hr at 01/29/23 1200   fentaNYL infusion INTRAVENOUS 200 mcg/hr (01/29/23 1207)   heparin 10,000 units/ 20 mL infusion syringe 500 Units/hr (01/29/23 0900)    octreotide (SANDOSTATIN) 500 mcg in sodium chloride 0.9 % 250 mL (2 mcg/mL) infusion 50 mcg/hr (01/29/23 1200)   prismasol BGK 4/2.5 1,500 mL/hr at 01/29/23 1050    PRN Meds: fentaNYL, heparin, ipratropium-albuterol, midazolam, ondansetron (ZOFRAN) IV, mouth rinse, polyethylene glycol  Physical Exam         Young patient appears ill resting in bed Monitor noted Has ET  tube Is on ventilatory support Abdomen is distended He has generalized edema  Vital Signs: BP 133/73   Pulse (!) 122   Temp 100.3 F (37.9 C) (Axillary) Comment: reported to rn  Resp 14   Ht 5\' 9"  (1.753 m)   Wt 126.9 kg   SpO2 98%   BMI 41.31 kg/m  SpO2: SpO2: 98 % O2 Device: O2 Device: Ventilator O2 Flow Rate: O2 Flow Rate (L/min): 4 L/min  Intake/output summary:  Intake/Output Summary (Last 24 hours) at 01/29/2023 1247 Last data filed at 01/29/2023 1200 Gross per 24 hour  Intake 4098.6 ml  Output 6500.7 ml  Net -2402.1 ml   LBM: Last BM Date :  (PTA) Baseline Weight: Weight: 99.8 kg Most recent weight: Weight: 126.9 kg       Palliative Assessment/Data:      Patient Active Problem List   Diagnosis Date Noted   Decompensated hepatic cirrhosis (HCC) 01/28/2023   Acute respiratory failure with hypoxia (HCC) 01/24/2023   Gross hematuria 01/24/2023   Anemia 01/23/2023   Alcohol withdrawal (HCC) 10/27/2022   Alcohol intoxication in active alcoholic without complication (HCC) 10/25/2022   Elevated AFP 08/20/2022   Hyperammonemia (HCC) 07/31/2022   Class 1 obesity 07/28/2022   Hypokalemia 07/28/2022   Hypomagnesemia 07/28/2022   Symptomatic anemia 07/27/2022   Thrombocytopenia (HCC) 07/27/2022   GERD (gastroesophageal reflux disease) 07/26/2022   Coagulopathy (HCC) 07/26/2022   Hematoma 07/26/2022   Hyperbilirubinemia 07/26/2022   Hypoalbuminemia 01/29/2022   Iron deficiency anemia 01/12/2022   Apnea 01/11/2022   Murmur 01/11/2022   Alcoholic cirrhosis of liver without ascites (HCC)  05/21/2021   GI bleeding 04/25/2021   Essential hypertension 04/25/2021   Acute blood loss anemia 04/25/2021   Hyperglobulinemia 12/23/2019   Prediabetes 04/29/2019   Proteinuria 04/29/2019    Palliative Care Assessment & Plan   Patient Profile:    Assessment: Acute GI blood loss with associated hemorrhagic shock in the setting of esophageal varices, MSSA tracheobronchitis versus pneumonia with associated sepsis, ventilator dependent acute respiratory failure Serious life limiting illness of decompensated alcoholic cirrhosis with history of alcohol use. Acute renal failure suspected to be from ATN, undergoing time-limited trial of CVVHD. Palliative care following for additional support.  Recommendations/Plan: Chart reviewed, patient seen, discussed with the father present at bedside.  Offered supportive empathic therapeutic presence and communication techniques at time of this initial consultation.  Discussed gently, frankly but compassionately with the patient's father that the patient remains at high risk for ongoing decline and decompensation and that he remains at high risk for not surviving this hospitalization.  Patient's father endorses that right now they are putting a lot for him and hopeful for some improvement/recovery.  PMT will continue to follow along.  Goals of Care and Additional Recommendations: Limitations on Scope of Treatment: Full Scope Treatment  Code Status:    Code Status Orders  (From admission, onward)           Start     Ordered   01/23/23 1016  Full code  Continuous       Question:  By:  Answer:  Consent: discussion documented in EHR   01/23/23 1017           Code Status History     Date Active Date Inactive Code Status Order ID Comments User Context   10/25/2022 1538 11/02/2022 1821 Full Code [id number]  Bobette Mo, MD ED   07/26/2022 1627 07/28/2022 2006 Full Code [id number]  Teddy Spike, DO  ED   01/12/2022 1357 01/16/2022 1836  Full Code [id number]  Quincy Simmonds, MD ED   04/25/2021 1446 04/28/2021 1850 Full Code [id number]  Arrien, York Ram, MD Inpatient       Prognosis:  Guarded   Discharge Planning: To Be Determined  Care plan was discussed with  father present in the room.   Thank you for allowing the Palliative Medicine Team to assist in the care of this patient.  Mod MDM     Greater than 50%  of this time was spent counseling and coordinating care related to the above assessment and plan.  Rosalin Hawking, MD  Please contact Palliative Medicine Team phone at [phone] for questions and concerns.

## 2023-01-29 NOTE — Progress Notes (Signed)
[facility] Progress Note  Subjective: seen in ICU, I/O 1.6 L + yest and 1.9 L neg so far today. Off pressors today, BP's better 130/ 60 range.   Vitals:   01/29/23 1100 01/29/23 1110 01/29/23 1200 01/29/23 1300  BP: 133/70  133/73 (!) 135/55  Pulse: (!) 110  (!) 122 (!) 125  Resp: (!) 23  14 19   Temp:   100.3 F (37.9 C)   TempSrc:   Axillary   SpO2: 96% 94% 98% 96%  Weight:      Height:        Exam: General: obese BM-  sedated on the vent Heart: RRR Lungs: dec BS at bases Abdomen: soft, dec'd BS Extremities: diffuse tight bilat 3-4+ LE edema, 2-3+ UE edema Skin: warm and dry Neuro: sedated    UA 4/26 - prot 30, rbc 21-50 rbc, 6-10 wbc, rare bact  UNa - 45   UCr - 248   Renal US - 12.1/ 11.9 cm kidneys w/o hydro or mass   B/l creatinine = 0.50 from 11/01/22       Assessment/ Plan: AKI - b/l creat 0.50 from 11/01/22. Creat here was 1.6 on admission 4/25 and has risen up to 2.8 yest and 4.1 today. UOP dropped over several days and urine color turned dark brown. Pt had been started on pressors for 2-3 days. UNa was 45. Suspect ATN or hemodynamic AKI due to shock and/or hyperbilirubinemia. HRS is less likely - usually slower to evolve and UNa would be < 10. Pt w/ severe vol overload. Started time-limited trial of CRRT on 4/29. Tolerating well and BP's stable w/ UF 150 cc/hr. Will ^UF. Cont CRRT for now.  Hyperkalemia - K+ 5.3, will change to 2K dialysate.  Vol overload - massive bilat LE edema, getting fluid off w/ CRRT now.  Shock / hypotension - low dose pressor (levo gtt) was dc'd 4/29. BP's okay.  ETOH abuse Decomp alcoholic cirrhosis  AHRF - w/ vol overload +/- aspiration Anemia - transfuse prn   Vinson Moselle MD [facility] 01/29/2023, 1:13 PM  Recent Labs  Lab 01/28/23 0555 01/28/23 1553 01/29/23 0338 01/29/23 0343  HGB 7.4*  --  8.1*  --   ALBUMIN 3.6 3.7 3.5 3.5  CALCIUM 9.1 8.9  --  8.8*  PHOS  --  7.2*  --  5.8*  CREATININE 5.03* 4.93*  --  3.86*  K  4.4 5.3*  --  5.3*    No results for input(s): "IRON", "TIBC", "FERRITIN" in the last 168 hours. Inpatient medications:  Chlorhexidine Gluconate Cloth  6 each Topical Daily   docusate  100 mg Per Tube BID   feeding supplement (PROSource TF20)  60 mL Per Tube TID   folic acid  1 mg Per Tube Daily   insulin aspart  0-15 Units Subcutaneous Q4H   lactulose  20 g Per Tube BID   leptospermum manuka honey  1 Application Topical Daily   midodrine  5 mg Per Tube TID WC   multivitamin  15 mL Oral Daily   mouth rinse  15 mL Mouth Rinse Q2H   pantoprazole (PROTONIX) IV  40 mg Intravenous Q12H   polyethylene glycol  17 g Per Tube Daily   prednisoLONE  40 mg Per Tube Daily   rifaximin  550 mg Per Tube BID   thiamine  100 mg Per Tube Daily   Or   thiamine  100 mg Intravenous Daily     prismasol BGK 4/2.5 400 mL/hr  at 01/29/23 0335    prismasol BGK 4/2.5 400 mL/hr at 01/29/23 0335   sodium chloride Stopped (01/28/23 1435)   dexmedetomidine (PRECEDEX) IV infusion 0.8 mcg/kg/hr (01/29/23 1300)   feeding supplement (VITAL 1.5 CAL) 60 mL/hr at 01/29/23 1300   fentaNYL infusion INTRAVENOUS 200 mcg/hr (01/29/23 1300)   heparin 10,000 units/ 20 mL infusion syringe 500 Units/hr (01/29/23 0900)   octreotide (SANDOSTATIN) 500 mcg in sodium chloride 0.9 % 250 mL (2 mcg/mL) infusion 50 mcg/hr (01/29/23 1300)   prismasol BGK 4/2.5 1,500 mL/hr at 01/29/23 1050   fentaNYL, heparin, ipratropium-albuterol, midazolam, ondansetron (ZOFRAN) IV, mouth rinse, polyethylene glycol

## 2023-01-29 NOTE — Progress Notes (Signed)
NAME:  [name], MRN:  [id number], DOB:  1986/04/15, LOS: 6 ADMISSION DATE:  01/23/2023, CONSULTATION DATE:  4/28 REFERRING MD:  Dr. Eloise Harman , CHIEF COMPLAINT:  Weakness    History of Present Illness:  37 year old man presents to the ED with weakness found to have hemoglobin of 3.   He denies any hematemesis.  No hemoptysis.  No hematochezia.  Denies melena.  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  Last drink 2 days prior to admission.  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  He was hospitalized 10/2022 with GI bleed.  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  Also some portal hypertensive gastropathy.  At home he takes Lasix and spironolactone for lower extremity edema.  He also takes lactulose.   Upon arrival, he was tachycardic to the 170s and SVT.  He was cardioverted.  He remained normotensive.  He remains tachycardic but less so.  His T. bili seems at baseline.  LFTs seem at recent baseline as well.  Last month notable for acute renal failure, severe elevation in creatinine. Denied signs of GI bleeding. Initial PLTs 222, INR 2.5, scr 1.6 (baseline 0.5 range). Got blood (4 units PRBC) admitted to ICU. GI consulted. Placed on Octreotide + PPI infusion. GI recommended CT angio abdomen and pelvis with BRTO. Empiric CTX initiated.  Pertinent  Medical History  Alcoholic Cirrhosis with hx of Varices, Portal Hypertensive Gastropathy   Significant Hospital Events: Including procedures, antibiotic start and stop dates in addition to other pertinent events   4/25 Admit with weakness in setting of Hgb 3. GI consulted. Octreotide + PPI. Empiric CTX.  4/26 Increased WOB, gross hematuria w/clots.  CTA Abd/pelvis showed cirrhotic liver with borderline splenomegaly, large portosystemic venous collaterals, small gastric & esophageal varices, no ascites, cholelithiasis, small bilateral pleural effusions R>L. Hgb 5.3  overnight, 2 more units PRBC and lasix. Urology evaluation, no blood in foley after insertion.  Intubated.  MELDNa 33. IR consulted, not a candidate for TIPS b/c of advanced MELD score and vascular anatomy. No response to lasix. MRSA PCR neg.   4/27 Remains on vent with propofol/fentanyl, octreotide. Worsening AKI. EGD showed small esophageal varices, no evidence of active bleeding, OGT left in place.  4/28 Failed SBT. On fentanyl. Jaundice with worsening bili/INR.  Worsening creatinine, decreasing UOP. BP soft, Hgb <7, 1 unit PRBC given.  4/29 Failed SBT, intermittent vent dyssynchrony 4/30 started on CRRT  Interim History / Subjective:  NAEON. Tolerating CRRT well (started yesterday afternoon), pulling around 150/hr. Currently remains +12L since admit.  Objective   Blood pressure 127/77, pulse (!) 119, temperature 98.8 F (37.1 C), temperature source Axillary, resp. rate 19, height 5\' 9"  (1.753 m), weight 126.9 kg, SpO2 97 %.    Vent Mode: PCV FiO2 (%):  [30 %-35 %] 30 % Set Rate:  [20 bmp] 20 bmp Vt Set:  [560 mL] 560 mL PEEP:  [8 cmH20] 8 cmH20 Plateau Pressure:  [28 cmH20-36 cmH20] 36 cmH20   Intake/Output Summary (Last 24 hours) at 01/29/2023 0727 Last data filed at 01/29/2023 0700 Gross per 24 hour  Intake 4195.03 ml  Output 5002.7 ml  Net -807.67 ml    Filed Weights   01/27/23 0545 01/28/23 0500 01/29/23 0500  Weight: 123.6 kg 128.2 kg 126.9 kg    Examination: General: critically ill appearing young adult male lying in bed in NAD on vent HEENT: MM pink/moist, ETT, scleral icterus, EOMI  Neuro: on fentanyl + precedex. Opens eyes but does not follow commands CV: RRR, ST on monitor, no m/r/g PULM: non-labored at rest, lungs bilaterally clear with good air entry GI: soft, bsx4 active, tolerating TF 60 ml/hr Extremities: warm/dry, anasarca, 2-3+ pitting edema in BLE up to trunk, dependent edema in hands Skin: no rashes or lesions GU: foley with rust colored urine   Tracheal  Aspirate 4/26 > MSSA   Assessment & Plan:   Acute Blood Loss Anemia  In setting of hematuria, GIB. Largest contributor suspected to be GI given hx of cirrhosis, prior EGD findings and portal hypertensive gastropathy with varices. No hematemesis this admit.  Had significant hematuria with clots but no blood in foley on insertion - thought by Urology that he could have had a uretheral lesion that foley created tamponade. Varix on EGD but not banded. Hx recurrent gum bleeding per brother.  -appreciate GI, Urology, IR  -follow CBC closely, Hgb stable  -transfuse for hgb <7% or concern for active bleeding  -continue octreotide infusion, BID PPI  -defer duration of octreotide to GI   Decompensated Alcoholic Cirrhosis  ETOH Abuse  Nodular contour on RUQ [place], confirmed cirrhosis on CTA ABD.  Was sober for 8 months prior to admit, relapsed 2 days prior to admit and that was last drink. Maddrey's Discriminant Function Score 68 4/27. MELD 33. Worsening T. Bili/INR. Hepatitis panel negative.  -lactulose, xifaxan PT  -continue prednisolone 40mg  QD, started 4/27  -follow INR/t.bili trend   Acute Hepatic / Metabolic Encephalopathy  -PAD protocol with fentanyl and precedex, PRN versed  -RASS Goal 0 to -1  -minimize sedation as able  -treat underlying cirrhosis, AKI  -folate, thiamine, MVI  -frequent reorientation   Acute Hypoxic Respiratory Failure in setting of Pulmonary Edema, Possible MSSA Aspiration PNA vs Tracheobronchitis s/p course of CTX -PRVC with LTVV  -wean PEEP / FiO2 for sats >90% -follow intermittent CXR  -SBT / WUA as tolerated   AKI - improving on CRRT which was started 4/30 Hyponatremia  Metabolic Acidosis   UNa 45, more consistent with ATN due to shock/ischemic perfusion, hyperbilirubinemia.  HRS less likely (urine Na should be <10) -appreciate Nephrology  -continue CRRT and volume removal as able -continue albumin, midodrine, octreotide  -goal MAP >75 for renal perfusion   -Trend BMP / urinary output -Replace electrolytes as indicated -Avoid nephrotoxic agents, ensure adequate renal perfusion  Hematuria / Clots  Interestingly, no blood in foley once placed, ? Urethral lesion  -follow UOP -appreciate Urology, if need they can place irrigation foley   LLE Wound  3cm x 2cm x 0.4cm LE lesion  -appreciate WOC evaluation  -will need wound center follow up as outpatient   Hyperglycemia  Steroid contribution  -glucose well controlled -SSI, moderate scale    Best Practice (right click and "Reselect all SmartList Selections" daily)  Diet/type: tubefeeds DVT prophylaxis: SCD GI prophylaxis: PPI Lines: N/A Foley:  Yes, and it is still needed Code Status:  full code Last date of multidisciplinary goals of care discussion: Cousin updated at bedside 4/30.  Brother, Amada Jupiter, updated via phone.  Reviewed nature of critical illness, renal lab findings with concerns for ATN.  Discussed utility of time limited trial of HD but that he is critically ill.  There is some hope of renal recovery if ATN (urine studies consistent with such) but his liver may be the defining issue in terms of his recovery from ICU illness. He indicates understanding and is in support of time  limited trial of HD. If no recovery, he understands that we may be facing discussion of transition to comfort measures.   Amada Jupiter indicates his parents to make any decisions regarding his brothers care in terms of end of life (although he recognizes we are not at that point yet).   Whitaker Holderman, father, [phone] Adem Costlow, [phone]  Critical care time: 40 minutes    [place] Guys, [place] [facility] For pager details, please see AMION or use Epic chat  After 1900, please call [facility] for cross coverage needs 01/29/2023, 7:34 AM

## 2023-01-29 NOTE — Progress Notes (Addendum)
Readmission screen on hold patient is currently intubated and unable to participate in assessment. TOC acknowledges need for high risk assessment. Patient is currently critically ill being intubated and on CRRT. TOC will continue to follow.

## 2023-01-29 DEATH — deceased

## 2023-01-30 ENCOUNTER — Inpatient Hospital Stay (HOSPITAL_COMMUNITY): Payer: Medicaid Other

## 2023-01-30 DIAGNOSIS — J9601 Acute respiratory failure with hypoxia: Secondary | ICD-10-CM | POA: Diagnosis not present

## 2023-01-30 LAB — GLUCOSE, CAPILLARY
Glucose-Capillary: 143 mg/dL — ABNORMAL HIGH (ref 70–99)
Glucose-Capillary: 147 mg/dL — ABNORMAL HIGH (ref 70–99)
Glucose-Capillary: 155 mg/dL — ABNORMAL HIGH (ref 70–99)
Glucose-Capillary: 135 mg/dL — ABNORMAL HIGH (ref 70–99)
Glucose-Capillary: 126 mg/dL — ABNORMAL HIGH (ref 70–99)
Glucose-Capillary: 152 mg/dL — ABNORMAL HIGH (ref 70–99)

## 2023-01-30 LAB — HEPATIC FUNCTION PANEL
ALT: 16 U/L (ref 0–44)
AST: 40 U/L (ref 15–41)
Albumin: 3.4 g/dL — ABNORMAL LOW (ref 3.5–5.0)
Alkaline Phosphatase: 66 U/L (ref 38–126)
Bilirubin, Direct: 6.9 mg/dL — ABNORMAL HIGH (ref 0.0–0.2)
Indirect Bilirubin: 7.8 mg/dL — ABNORMAL HIGH (ref 0.3–0.9)
Total Bilirubin: 14.7 mg/dL — ABNORMAL HIGH (ref 0.3–1.2)
Total Protein: 9.4 g/dL — ABNORMAL HIGH (ref 6.5–8.1)

## 2023-01-30 LAB — APTT: aPTT: 51 seconds — ABNORMAL HIGH (ref 24–36)

## 2023-01-30 LAB — BPAM FFP
Blood Product Expiration Date: 202404302359
ISSUE DATE / TIME: 202404261039
Unit Type and Rh: 5100
Unit Type and Rh: 5100

## 2023-01-30 LAB — MAGNESIUM
Magnesium: 2.7 mg/dL — ABNORMAL HIGH (ref 1.7–2.4)
Magnesium: 2.9 mg/dL — ABNORMAL HIGH (ref 1.7–2.4)

## 2023-01-30 LAB — PREPARE FRESH FROZEN PLASMA

## 2023-01-30 LAB — CBC
HCT: 27.5 % — ABNORMAL LOW (ref 39.0–52.0)
Hemoglobin: 8.2 g/dL — ABNORMAL LOW (ref 13.0–17.0)
MCH: 26.9 pg (ref 26.0–34.0)
MCHC: 29.8 g/dL — ABNORMAL LOW (ref 30.0–36.0)
MCV: 90.2 fL (ref 80.0–100.0)
Platelets: 143 10*3/uL — ABNORMAL LOW (ref 150–400)
RBC: 3.05 MIL/uL — ABNORMAL LOW (ref 4.22–5.81)
RDW: 20.1 % — ABNORMAL HIGH (ref 11.5–15.5)
WBC: 7.1 10*3/uL (ref 4.0–10.5)
nRBC: 1.1 % — ABNORMAL HIGH (ref 0.0–0.2)

## 2023-01-30 LAB — BASIC METABOLIC PANEL
Anion gap: 9 (ref 5–15)
BUN: 45 mg/dL — ABNORMAL HIGH (ref 6–20)
CO2: 23 mmol/L (ref 22–32)
Calcium: 8.3 mg/dL — ABNORMAL LOW (ref 8.9–10.3)
Chloride: 98 mmol/L (ref 98–111)
Creatinine, Ser: 3.07 mg/dL — ABNORMAL HIGH (ref 0.61–1.24)
GFR, Estimated: 26 mL/min — ABNORMAL LOW (ref >60)
Glucose, Bld: 149 mg/dL — ABNORMAL HIGH (ref 70–99)
Potassium: 4.6 mmol/L (ref 3.5–5.1)
Sodium: 130 mmol/L — ABNORMAL LOW (ref 135–145)

## 2023-01-30 LAB — RENAL FUNCTION PANEL
Albumin: 3.3 g/dL — ABNORMAL LOW (ref 3.5–5.0)
Anion gap: 10 (ref 5–15)
BUN: 43 mg/dL — ABNORMAL HIGH (ref 6–20)
CO2: 23 mmol/L (ref 22–32)
Calcium: 8.7 mg/dL — ABNORMAL LOW (ref 8.9–10.3)
Chloride: 100 mmol/L (ref 98–111)
Creatinine, Ser: 2.65 mg/dL — ABNORMAL HIGH (ref 0.61–1.24)
GFR, Estimated: 30 mL/min — ABNORMAL LOW (ref >60)
Glucose, Bld: 130 mg/dL — ABNORMAL HIGH (ref 70–99)
Phosphorus: 4.5 mg/dL (ref 2.5–4.6)
Potassium: 5.1 mmol/L (ref 3.5–5.1)
Sodium: 133 mmol/L — ABNORMAL LOW (ref 135–145)

## 2023-01-30 LAB — PHOSPHORUS: Phosphorus: 4.4 mg/dL (ref 2.5–4.6)

## 2023-01-30 MED ORDER — METOCLOPRAMIDE HCL 10 MG/10ML PO SOLN
10.0000 mg | Freq: Four times a day (QID) | ORAL | Status: AC
  Administered 2023-01-30 – 2023-01-31 (×4): 10 mg
  Filled 2023-01-30 (×4): qty 10

## 2023-01-30 MED ORDER — ADULT MULTIVITAMIN LIQUID CH
15.0000 mL | Freq: Every day | ORAL | Status: DC
  Administered 2023-01-31 – 2023-02-03 (×4): 15 mL
  Filled 2023-01-30 (×4): qty 15

## 2023-01-30 MED ORDER — METOCLOPRAMIDE HCL 10 MG/10ML PO SOLN
10.0000 mg | Freq: Four times a day (QID) | ORAL | Status: DC
  Administered 2023-01-30 (×2): 10 mg via ORAL
  Filled 2023-01-30 (×5): qty 10

## 2023-01-30 MED ORDER — BISACODYL 10 MG RE SUPP
10.0000 mg | Freq: Every day | RECTAL | Status: DC | PRN
  Administered 2023-01-31: 10 mg via RECTAL
  Filled 2023-01-30: qty 1

## 2023-01-30 MED ORDER — METOPROLOL TARTRATE 5 MG/5ML IV SOLN
INTRAVENOUS | Status: AC
  Filled 2023-01-30: qty 5

## 2023-01-30 MED ORDER — METOPROLOL TARTRATE 5 MG/5ML IV SOLN: 5.0000 mg | INTRAVENOUS | Status: AC

## 2023-01-30 NOTE — Progress Notes (Signed)
NAME:  Evelyn Moch, MRN:  [id number], DOB:  [birth year], LOS: 7 ADMISSION DATE:  01/23/2023, CONSULTATION DATE:  4/28 REFERRING MD:  Dr. Eloise Harman , CHIEF COMPLAINT:  Weakness    History of Present Illness:  37 year old man presents to the ED with weakness found to have hemoglobin of 3.   He denies any hematemesis.  No hemoptysis.  No hematochezia.  Denies melena.  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  Last drink 2 days prior to admission.  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  He was hospitalized 10/2022 with GI bleed.  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  Also some portal hypertensive gastropathy.  At home he takes Lasix and spironolactone for lower extremity edema.  He also takes lactulose.   Upon arrival, he was tachycardic to the 170s and SVT.  He was cardioverted.  He remained normotensive.  He remains tachycardic but less so.  His T. bili seems at baseline.  LFTs seem at recent baseline as well.  Last month notable for acute renal failure, severe elevation in creatinine. Denied signs of GI bleeding. Initial PLTs 222, INR 2.5, scr 1.6 (baseline 0.5 range). Got blood (4 units PRBC) admitted to ICU. GI consulted. Placed on Octreotide + PPI infusion. GI recommended CT angio abdomen and pelvis with BRTO. Empiric CTX initiated.  Pertinent  Medical History  Alcoholic Cirrhosis with hx of Varices, Portal Hypertensive Gastropathy   Significant Hospital Events: Including procedures, antibiotic start and stop dates in addition to other pertinent events   4/25 Admit with weakness in setting of Hgb 3. GI consulted. Octreotide + PPI. Empiric CTX.  4/26 Increased WOB, gross hematuria w/clots.  CTA Abd/pelvis showed cirrhotic liver with borderline splenomegaly, large portosystemic venous collaterals, small gastric & esophageal varices, no ascites, cholelithiasis, small bilateral pleural effusions R>L. Hgb 5.3  overnight, 2 more units PRBC and lasix. Urology evaluation, no blood in foley after insertion.  Intubated.  MELDNa 33. IR consulted, not a candidate for TIPS b/c of advanced MELD score and vascular anatomy. No response to lasix. MRSA PCR neg.   4/27 Remains on vent with propofol/fentanyl, octreotide. Worsening AKI. EGD showed small esophageal varices, no evidence of active bleeding, OGT left in place.  4/28 Failed SBT. On fentanyl. Jaundice with worsening bili/INR.  Worsening creatinine, decreasing UOP. BP soft, Hgb <7, 1 unit PRBC given.  4/29 Failed SBT, intermittent vent dyssynchrony 4/30 started on CRRT  Interim History / Subjective:  SVT episode overnight, resolved with 1 dose Versed. Tolerating CRRT well, pulling around 160/hr and appears we have room to increase further. Currently +8.4L net and clinically still quite overloaded.  Objective   Blood pressure (!) 108/58, pulse (!) 120, temperature 100 F (37.8 C), temperature source Axillary, resp. rate 19, height 5\' 9"  (1.753 m), weight 122.2 kg, SpO2 99 %.    Vent Mode: PCV FiO2 (%):  [30 %-40 %] 40 % Set Rate:  [20 bmp] 20 bmp PEEP:  [8 cmH20] 8 cmH20 Plateau Pressure:  [25 cmH20-38 cmH20] 25 cmH20   Intake/Output Summary (Last 24 hours) at 01/30/2023 0732 Last data filed at 01/30/2023 0700 Gross per 24 hour  Intake 3762.81 ml  Output 7513.7 ml  Net -3750.89 ml    Filed Weights   01/28/23 0500 01/29/23 0500 01/30/23 0500  Weight: 128.2 kg 126.9 kg 122.2 kg    Examination: General: critically ill appearing young adult male lying in bed in NAD  on vent HEENT: MM pink/moist, ETT, scleral icterus, EOMI Neuro: on fentanyl + precedex. Opens eyes to noxious stimuli but does not follow commands CV: RRR, ST on monitor, no m/r/g PULM: non-labored at rest, lungs bilaterally clear with good air entry GI: soft, bsx4 active, tolerating TF 60 ml/hr Extremities: warm/dry, anasarca, 2-3+ pitting edema in BLE up to trunk, dependent edema in  hands Skin: no rashes or lesions  Tracheal Aspirate 4/26 > MSSA   Assessment & Plan:   Acute Blood Loss Anemia  In setting of hematuria, GIB. Largest contributor suspected to be GI given hx of cirrhosis, prior EGD findings and portal hypertensive gastropathy with varices. No hematemesis this admit.  Had significant hematuria with clots but no blood in foley on insertion - thought by Urology that he could have had a uretheral lesion that foley created tamponade. Varix on EGD but not banded. Hx recurrent gum bleeding per brother.  -appreciate GI, Urology, IR  -follow CBC closely, Hgb stable  -transfuse for hgb <7% or concern for active bleeding  -continue octreotide infusion, BID PPI  -defer duration of octreotide to GI   Decompensated Alcoholic Cirrhosis  ETOH Abuse  Nodular contour on RUQ [place], confirmed cirrhosis on CTA ABD.  Was sober for 8 months prior to admit, relapsed 2 days prior to admit and that was last drink. Maddrey's Discriminant Function Score 68 4/27. MELD 33. Worsening T. Bili/INR. Hepatitis panel negative.  -lactulose, xifaxan PT  -continue prednisolone 40mg  QD, started 4/27  -follow INR/t.bili trend  - GI following, appreciate the assistance  Acute Hepatic / Metabolic Encephalopathy  -PAD protocol with fentanyl and precedex, PRN versed  -RASS Goal 0 to -1  -minimize sedation as able  -treat underlying cirrhosis, AKI  -folate, thiamine, MVI  -frequent reorientation   Acute Hypoxic Respiratory Failure in setting of Pulmonary Edema, Possible MSSA Aspiration PNA vs Tracheobronchitis s/p course of CTX -PRVC with LTVV  -wean PEEP / FiO2 for sats >90% - Defer SBT until more volume removed -follow intermittent CXR   AKI - improving on CRRT which was started 4/30 Hyponatremia  UNa 45, more consistent with ATN due to shock/ischemic perfusion, hyperbilirubinemia.  HRS less likely (urine Na should be <10) -appreciate Nephrology  -continue CRRT and volume removal as able,  increase today -continue albumin, midodrine, octreotide  -Trend BMP / urinary output -Replace electrolytes as indicated -Avoid nephrotoxic agents, ensure adequate renal perfusion  Hematuria / Clots  Interestingly, no blood in foley once placed, ? Urethral lesion  -follow UOP -appreciate Urology, if need they can place irrigation foley   LLE Wound  3cm x 2cm x 0.4cm LE lesion  -appreciate WOC evaluation  -will need wound center follow up as outpatient   Hyperglycemia  Steroid contribution  -glucose well controlled -SSI, moderate scale    Constipation and possible ileus - Miralax PRN, Ducosate daily - Add Dulcolax - Add Reglan  Best Practice (right click and "Reselect all SmartList Selections" daily)  Diet/type: tubefeeds DVT prophylaxis: SCD GI prophylaxis: PPI Lines: N/A Foley:  Yes, and it is still needed Code Status:  full code Last date of multidisciplinary goals of care discussion: Cousin updated at bedside 4/30.  Brother, Amada Jupiter, updated via phone.  Reviewed nature of critical illness, renal lab findings with concerns for ATN.  Discussed utility of time limited trial of HD but that he is critically ill.  There is some hope of renal recovery if ATN (urine studies consistent with such) but his liver may be the  defining issue in terms of his recovery from ICU illness. He indicates understanding and is in support of time limited trial of HD. If no recovery, he understands that we may be facing discussion of transition to comfort measures.   Amada Jupiter indicates his parents to make any decisions regarding his brothers care in terms of end of life (although he recognizes we are not at that point yet).   Christiano Blandon, father, [phone] Alessander Sikorski, [phone]  Critical care time: 35 minutes    Rutherford Guys, [place] [facility] For pager details, please see AMION or use Epic chat  After 1900, please call [facility] for cross coverage  needs 01/30/2023, 7:32 AM

## 2023-01-30 NOTE — Progress Notes (Addendum)
[facility] Progress Note  Subjective: pt seen in room, remains off pressors. I/O yest net neg 3.5 L. Weights down to 122 kg.   Vitals:   01/30/23 1300 01/30/23 1400 01/30/23 1430 01/30/23 1500  BP: 119/70 115/71 113/71 112/71  Pulse: (!) 125 (!) 123 (!) 125 (!) 123  Resp: 20 19 20 20   Temp:    98.4 F (36.9 C)  TempSrc:    Oral  SpO2: 100% 98% 96% 99%  Weight:      Height:        Exam: General: obese BM-  sedated on the vent Heart: RRR Lungs: dec BS at bases Abdomen: soft, dec'd BS Extremities: diffuse tight bilat 3-4+ LE edema, 2-3+ UE edema Skin: warm and dry Neuro: sedated    UA 4/26 - prot 30, rbc 21-50 rbc, 6-10 wbc, rare bact  UNa - 45   UCr - 248   Renal US - 12.1/ 11.9 cm kidneys w/o hydro or mass   B/l creatinine = 0.50 from 11/01/22       Assessment/ Plan: AKI - b/l creat 0.50 from 11/01/22. Creat here was 1.6 on admission 4/25 and has risen up to 2.8 and then 4.1 at time of consult. Pt developed shock requiring pressors which was 2 days prior to onset of AKI, then UOP dropped off and urine color turned dark brown. UNa was 45. Suspected ATN or hemodynamic AKI due to shock and/or hyperbilirubinemia. HRS is less likely - usually slower to evolve and UNa would be < 10. Pt w/ severe vol overload. Started time-limited trial of CRRT on 4/29. Tolerating well and BP's stable w/ UF 150 cc/hr. Will ^UF to 150- 200cc/hr.  Shock - was on pressor support for about 36 hrs 4/28- 4/29, is now off Hyperkalemia - better on 2K dialysate.  Vol overload - 3+  bilat LE edema, not improving yet  Shock / hypotension - low dose pressor (levo gtt) was dc'd 4/29. BP's okay.  ETOH abuse Decomp alcoholic cirrhosis  AHRF - w/ vol overload +/- aspiration Anemia - transfuse prn   Vinson Moselle MD [facility] 01/30/2023, 3:47 PM  Recent Labs  Lab 01/29/23 0338 01/29/23 0343 01/29/23 1541 01/30/23 0236 01/30/23 0237 01/30/23 0241  HGB 8.1*  --   --   --   --  8.2*  ALBUMIN 3.5   <  > 3.4*  --   --  3.4*  CALCIUM  --    < > 8.4* 8.3*  --   --   PHOS  --    < > 4.3  --  4.4  --   CREATININE  --    < > 3.37* 3.07*  --   --   K  --    < > 4.8 4.6  --   --    < > = values in this interval not displayed.    No results for input(s): "IRON", "TIBC", "FERRITIN" in the last 168 hours. Inpatient medications:  Chlorhexidine Gluconate Cloth  6 each Topical Q0600   docusate  100 mg Per Tube BID   feeding supplement (PROSource TF20)  60 mL Per Tube TID   folic acid  1 mg Per Tube Daily   insulin aspart  0-15 Units Subcutaneous Q4H   lactulose  20 g Per Tube BID   leptospermum manuka honey  1 Application Topical Daily   metoCLOPramide  10 mg Oral Q6H   midodrine  5 mg Per Tube TID WC   multivitamin  15 mL Oral Daily   mouth rinse  15 mL Mouth Rinse Q2H   pantoprazole (PROTONIX) IV  40 mg Intravenous Q12H   polyethylene glycol  17 g Per Tube Daily   prednisoLONE  40 mg Per Tube Daily   rifaximin  550 mg Per Tube BID   thiamine  100 mg Per Tube Daily   Or   thiamine  100 mg Intravenous Daily     prismasol BGK 4/2.5 400 mL/hr at 01/30/23 0451    prismasol BGK 4/2.5 400 mL/hr at 01/30/23 0451   sodium chloride Stopped (01/28/23 1435)   dexmedetomidine (PRECEDEX) IV infusion 0.8 mcg/kg/hr (01/30/23 1500)   feeding supplement (VITAL 1.5 CAL) 60 mL/hr at 01/30/23 1500   fentaNYL infusion INTRAVENOUS 200 mcg/hr (01/30/23 1500)   heparin 10,000 units/ 20 mL infusion syringe 500 Units/hr (01/30/23 0310)   octreotide (SANDOSTATIN) 500 mcg in sodium chloride 0.9 % 250 mL (2 mcg/mL) infusion 50 mcg/hr (01/30/23 1500)   prismasol BGK 2/2.5 dialysis solution 1,500 mL/hr at 01/30/23 1309   bisacodyl, fentaNYL, heparin, ipratropium-albuterol, midazolam, ondansetron (ZOFRAN) IV, mouth rinse, polyethylene glycol

## 2023-01-30 NOTE — Progress Notes (Signed)
[facility] Progress Note  SUBJECTIVE:   Interval history: Skylur Fuston was seen and evaluated today at bedside.  Remains intubated and sedated.  Unable to assess review of systems. On CRRT.  No bowel movement per nursing.  Past Medical History:  Diagnosis Date   Liver cirrhosis, alcoholic (HCC)    Portal hypertension (HCC)    Splenomegaly, congestive, chronic    Past Surgical History:  Procedure Laterality Date   COSMETIC SURGERY     ESOPHAGOGASTRODUODENOSCOPY N/A 04/26/2021   Procedure: ESOPHAGOGASTRODUODENOSCOPY (EGD);  Surgeon: Willis Modena, MD;  Location: [facility];  Service: Endoscopy;  Laterality: N/A;   ESOPHAGOGASTRODUODENOSCOPY (EGD) WITH PROPOFOL N/A 11/01/2022   Procedure: ESOPHAGOGASTRODUODENOSCOPY (EGD) WITH PROPOFOL;  Surgeon: Vida Rigger, MD;  Location: [facility];  Service: Gastroenterology;  Laterality: N/A;   ESOPHAGOGASTRODUODENOSCOPY (EGD) WITH PROPOFOL N/A 01/25/2023   Procedure: ESOPHAGOGASTRODUODENOSCOPY (EGD) WITH PROPOFOL;  Surgeon: Kathi Der, MD;  Location: [facility];  Service: Gastroenterology;  Laterality: N/A;   MANDIBLE FRACTURE SURGERY     Current Facility-Administered Medications  Medication Dose Route Frequency Provider Last Rate Last Admin    prismasol BGK 4/2.5 infusion   CRRT Continuous Delano Metz, MD 400 mL/hr at 01/30/23 0451 New Bag at 01/30/23 0451    prismasol BGK 4/2.5 infusion   CRRT Continuous Delano Metz, MD 400 mL/hr at 01/30/23 0451 New Bag at 01/30/23 0451   0.9 %  sodium chloride infusion  250 mL Intravenous Continuous Kalman Shan, MD   Stopped at 01/28/23 1435   bisacodyl (DULCOLAX) suppository 10 mg  10 mg Rectal Daily PRN Celine Mans, Rahul P, PA-C       Chlorhexidine Gluconate Cloth 2 % PADS 6 each  6 each Topical Q0600 Leslye Peer, MD   6 each at 01/30/23 0520   dexmedetomidine (PRECEDEX) 400 MCG/100ML (4 mcg/mL) infusion  0-1.2 mcg/kg/hr Intravenous Titrated Ollis, Brandi L, NP 28.8 mL/hr  at 01/30/23 1023 0.9 mcg/kg/hr at 01/30/23 1023   docusate (COLACE) 50 MG/5ML liquid 100 mg  100 mg Per Tube BID Simonne Martinet, NP   100 mg at 01/30/23 1019   feeding supplement (PROSource TF20) liquid 60 mL  60 mL Per Tube TID Leslye Peer, MD   60 mL at 01/30/23 1020   feeding supplement (VITAL 1.5 CAL) liquid 1,000 mL  1,000 mL Per Tube Continuous Kalman Shan, MD 60 mL/hr at 01/30/23 1000 Infusion Verify at 01/30/23 1000   fentaNYL (SUBLIMAZE) bolus via infusion 50-100 mcg  50-100 mcg Intravenous Q15 min PRN Simonne Martinet, NP   100 mcg at 01/30/23 0301   fentaNYL in NS (60mcg/ml) infusion-PREMIX  50-200 mcg/hr Intravenous Continuous Simonne Martinet, NP 20 mL/hr at 01/30/23 1000 200 mcg/hr at 01/30/23 1000   folic acid (FOLVITE) tablet 1 mg  1 mg Per Tube Daily Omar Person, MD   1 mg at 01/30/23 1020   heparin 10,000 units/ 20 mL infusion syringe  500-1,000 Units/hr CRRT Continuous Delano Metz, MD 1 mL/hr at 01/30/23 0310 500 Units/hr at 01/30/23 0310   heparin injection 1,000-6,000 Units  1,000-6,000 Units CRRT PRN Delano Metz, MD       insulin aspart (novoLOG) injection 0-15 Units  0-15 Units Subcutaneous Q4H Hunsucker, Lesia Sago, MD   2 Units at 01/30/23 0805   ipratropium-albuterol (DUONEB) 0.5-2.5 (3) MG/3ML nebulizer solution 3 mL  3 mL Nebulization Q4H PRN Omar Person, MD   3 mL at 01/28/23 1054   lactulose (CHRONULAC) 10 GM/15ML solution 20 g  20 g Per Tube BID Omar Person, MD   20 g at 01/30/23 1019   leptospermum manuka honey (MEDIHONEY) paste 1 Application  1 Application Topical Daily Hunsucker, Lesia Sago, MD   1 Application at 01/30/23 0530   metoCLOPramide (REGLAN) 10 MG/10ML solution 10 mg  10 mg Oral Q6H Desai, Rahul P, PA-C   10 mg at 01/30/23 1020   midazolam (VERSED) injection 2 mg  2 mg Intravenous Q2H PRN [name] L, NP   2 mg at 01/30/23 0520   midodrine (PROAMATINE) tablet 5 mg  5 mg Per Tube TID WC Desai, Rahul P,  PA-C   5 mg at 01/30/23 0805   multivitamin liquid 15 mL  15 mL Oral Daily Ollis, Brandi L, NP   15 mL at 01/30/23 1021   octreotide (SANDOSTATIN) 500 mcg in sodium chloride 0.9 % 250 mL (2 mcg/mL) infusion  50 mcg/hr Intravenous Continuous Hunsucker, Lesia Sago, MD 25 mL/hr at 01/30/23 1000 50 mcg/hr at 01/30/23 1000   ondansetron (ZOFRAN) injection 4 mg  4 mg Intravenous Q6H PRN Hunsucker, Lesia Sago, MD       Oral care mouth rinse  15 mL Mouth Rinse Q2H Omar Person, MD   15 mL at 01/30/23 1021   Oral care mouth rinse  15 mL Mouth Rinse PRN Omar Person, MD       pantoprazole (PROTONIX) injection 40 mg  40 mg Intravenous Q12H Hunsucker, Lesia Sago, MD   40 mg at 01/30/23 1019   polyethylene glycol (MIRALAX / GLYCOLAX) packet 17 g  17 g Per Tube Daily Simonne Martinet, NP   17 g at 01/30/23 1020   polyethylene glycol (MIRALAX / GLYCOLAX) packet 17 g  17 g Per Tube Daily PRN Omar Person, MD       prednisoLONE (ORAPRED) 15 MG/5ML solution 40 mg  40 mg Per Tube Daily Kalman Shan, MD   40 mg at 01/30/23 1020   prismasol BGK 2/2.5 dialysis solution   CRRT Continuous Delano Metz, MD 1,500 mL/hr at 01/30/23 1001 New Bag at 01/30/23 1001   rifaximin (XIFAXAN) tablet 550 mg  550 mg Per Tube BID Omar Person, MD   550 mg at 01/30/23 1020   thiamine (VITAMIN B1) tablet 100 mg  100 mg Per Tube Daily Omar Person, MD   100 mg at 01/30/23 1020   Or   thiamine (VITAMIN B1) injection 100 mg  100 mg Intravenous Daily Omar Person, MD   100 mg at 01/27/23 1029   Allergies as of 01/23/2023   (No Known Allergies)   Review of Systems:  Review of Systems  Unable to perform ROS: Intubated    OBJECTIVE:   Temp:  [98.7 F (37.1 C)-100.3 F (37.9 C)] 98.7 F (37.1 C) (05/02 0808) Pulse Rate:  [40-126] 124 (05/02 1000) Resp:  [14-37] 20 (05/02 1000) BP: (97-147)/(44-104) 106/61 (05/02 1000) SpO2:  [90 %-100 %] 100 % (05/02 1000) FiO2 (%):  [30 %-40 %] 40 % (05/02  0825) Weight:  [122.2 kg] 122.2 kg (05/02 0500) Last BM Date :  (PTA) Physical Exam Constitutional:      Appearance: He is ill-appearing.     Interventions: He is sedated and intubated.  Cardiovascular:     Rate and Rhythm: Tachycardia present.  Pulmonary:     Effort: No respiratory distress. He is intubated.     Breath sounds: Normal breath sounds.     Comments: Intubated Abdominal:  General: There is distension.     Comments: Firm to palpation  Musculoskeletal:     Right lower leg: Edema (Improved) present.     Left lower leg: Edema (Improved) present.     Labs: Recent Labs    01/28/23 0555 01/29/23 0338 01/30/23 0241  WBC 6.3 6.3 7.1  HGB 7.4* 8.1* 8.2*  HCT 24.0* 27.3* 27.5*  PLT 154 147* 143*   BMET Recent Labs    01/29/23 0343 01/29/23 1541 01/30/23 0236  NA 131* 133* 130*  K 5.3* 4.8 4.6  CL 100 102 98  CO2 20* 20* 23  GLUCOSE 135* 116* 149*  BUN 49* 46* 45*  CREATININE 3.86* 3.37* 3.07*  CALCIUM 8.8* 8.4* 8.3*   LFT Recent Labs    01/30/23 0241  PROT 9.4*  ALBUMIN 3.4*  AST 40  ALT 16  ALKPHOS 66  BILITOT 14.7*  BILIDIR 6.9*  IBILI 7.8*   PT/INR No results for input(s): "LABPROT", "INR" in the last 72 hours. Diagnostic imaging: DG Abd 1 View  Result Date: 01/30/2023 CLINICAL DATA:  [id number] [facility]) [id number] EXAM: ABDOMEN - 1 VIEW COMPARISON:  Radiograph 01/29/2023 FINDINGS: Nasogastric tube tip and side port overlie the stomach. There is diffuse gaseous distention of small and large bowel, not significantly changed from prior. IMPRESSION: No significant change in diffuse gaseous dilation of small and large bowel, compatible with ileus. Nasogastric tube tip and side port overlie the stomach. Electronically Signed   By: Caprice Renshaw M.D.   On: 01/30/2023 09:30   DG Abd 1 View  Result Date: 01/29/2023 CLINICAL DATA:  NG tube placement. EXAM: ABDOMEN - 1 VIEW COMPARISON:  01/29/2023 at 0703 hours. FINDINGS: 0954 hours. Enteric tube tip and side  port project over the stomach. Unchanged gaseous distention of the bowel. IMPRESSION: Enteric tube tip and side port project over the stomach. Unchanged gaseous distention of the bowel. Electronically Signed   By: Orvan Falconer M.D.   On: 01/29/2023 10:06   DG CHEST PORT 1 VIEW  Result Date: 01/29/2023 CLINICAL DATA:  Provided history: Acute respiratory failure with hypoxia. EXAM: PORTABLE CHEST 1 VIEW COMPARISON:  Prior chest radiographs 01/28/2023 and earlier. FINDINGS: Right IJ approach catheter with tip at the level of the mid to lower SVC. ET tube present with tip at the superior margin of the clavicular heads. An enteric tube is not well delineated on the level of the distal esophagus. Cardiomegaly. Diffuse patchy bilateral airspace opacities, similar to the prior examination of 01/28/2023. A small left pleural effusion may be present. No evidence of pneumothorax. No acute osseous abnormality identified. Impression #1 will be called to the ordering clinician or representative by the Radiologist Assistant, and communication documented in the PACS or [employer]. IMPRESSION: 1. Support apparatus as described. Notably, the enteric tube is not well delineated beyond the level of the distal esophagus. Consider abdominal radiographs to better delineate tube position. Also of note, the ET tube terminates at the level of the superior margin of the clavicular heads. Consider advancement. 2. Diffuse patchy bilateral airspace opacities, similar to the prior examination of 01/28/2023. Findings likely reflect pulmonary edema. However, pneumonia cannot be excluded and clinical correlation is recommended. 3. Possible small left pleural effusion. 4. Cardiomegaly. Electronically Signed   By: Jackey Loge D.O.   On: 01/29/2023 08:39   DG Abd 1 View  Result Date: 01/29/2023 CLINICAL DATA:  Abdominal distention EXAM: ABDOMEN - 1 VIEW COMPARISON:  Abdominal radiograph dated January 25, 2023 FINDINGS: NG/OG  tube tip and  side port are in the stomach. Dilated gas-filled loops of small and large bowel, increased central when compared with the prior. No acute osseous abnormality. IMPRESSION: 1. NG/OG tube tip and side port are in the stomach. 2. Dilated gas-filled loops of small and large bowel with increased distention when compared with the prior, findings may be due to ileus or partial/early obstruction. Electronically Signed   By: Allegra Lai M.D.   On: 01/29/2023 08:15   DG Chest 1 View  Result Date: 01/28/2023 CLINICAL DATA:  Central line placement. EXAM: CHEST  1 VIEW COMPARISON:  January 27, 2023. FINDINGS: Stable cardiomegaly with central pulmonary vascular congestion. Bilateral lung opacities are noted concerning for pulmonary edema. Endotracheal and nasogastric tubes are in grossly good position. Right internal jugular catheter is noted with tip in expected position of the SVC. Bony thorax is unremarkable. IMPRESSION: Stable cardiomegaly with bilateral lung opacities most consistent with edema. Right internal jugular catheter is noted with tip in expected position of SVC. Electronically Signed   By: Lupita Raider M.D.   On: 01/28/2023 14:24    IMPRESSION: Cirrhosis secondary to alcohol decompensated by esophageal varices and hepatic encephalopathy             -HRS less likely per Nephrology             -Not a candidate for TIPS given elevated INR and unusual vascular anatomy             -MELD 3.0: 40   -Lille score today 0.154, indicative of steroid responsiveness Possible ileus versus early bowel obstruction Coagulopathy  Normocytic anemia Hematuria  Acute kidney injury, worsening, started CRRT Acute hypoxemic respiratory failure, intubated  PLAN: -Continue prednisolone given Lille score -Continue lactulose and Xifaxan, monitor bowel movements -Check abdominal x-ray today -Nephrology low suspicion for HRS, will discuss whether to continue HRS therapies -Your nephrology and ICU care   LOS: 7  days   Liliane Shi, [facility]

## 2023-01-31 ENCOUNTER — Inpatient Hospital Stay (HOSPITAL_COMMUNITY): Payer: Medicaid Other

## 2023-01-31 DIAGNOSIS — M7989 Other specified soft tissue disorders: Secondary | ICD-10-CM

## 2023-01-31 DIAGNOSIS — J9601 Acute respiratory failure with hypoxia: Secondary | ICD-10-CM | POA: Diagnosis not present

## 2023-01-31 LAB — TROPONIN I (HIGH SENSITIVITY)
Troponin I (High Sensitivity): 20 ng/L — ABNORMAL HIGH (ref <18)
Troponin I (High Sensitivity): 24 ng/L — ABNORMAL HIGH (ref <18)

## 2023-01-31 LAB — HEPATIC FUNCTION PANEL
ALT: 21 U/L (ref 0–44)
AST: 52 U/L — ABNORMAL HIGH (ref 15–41)
Albumin: 3.2 g/dL — ABNORMAL LOW (ref 3.5–5.0)
Alkaline Phosphatase: 67 U/L (ref 38–126)
Bilirubin, Direct: 6.9 mg/dL — ABNORMAL HIGH (ref 0.0–0.2)
Indirect Bilirubin: 7.5 mg/dL — ABNORMAL HIGH (ref 0.3–0.9)
Total Bilirubin: 14.4 mg/dL — ABNORMAL HIGH (ref 0.3–1.2)
Total Protein: 9.1 g/dL — ABNORMAL HIGH (ref 6.5–8.1)

## 2023-01-31 LAB — RENAL FUNCTION PANEL
Albumin: 3.2 g/dL — ABNORMAL LOW (ref 3.5–5.0)
Albumin: 3.4 g/dL — ABNORMAL LOW (ref 3.5–5.0)
Anion gap: 9 (ref 5–15)
Anion gap: 9 (ref 5–15)
BUN: 46 mg/dL — ABNORMAL HIGH (ref 6–20)
BUN: 46 mg/dL — ABNORMAL HIGH (ref 6–20)
CO2: 23 mmol/L (ref 22–32)
CO2: 23 mmol/L (ref 22–32)
Calcium: 8.6 mg/dL — ABNORMAL LOW (ref 8.9–10.3)
Calcium: 8.8 mg/dL — ABNORMAL LOW (ref 8.9–10.3)
Chloride: 100 mmol/L (ref 98–111)
Chloride: 101 mmol/L (ref 98–111)
Creatinine, Ser: 2.6 mg/dL — ABNORMAL HIGH (ref 0.61–1.24)
Creatinine, Ser: 2.75 mg/dL — ABNORMAL HIGH (ref 0.61–1.24)
GFR, Estimated: 31 mL/min — ABNORMAL LOW (ref >60)
GFR, Estimated: 29 mL/min — ABNORMAL LOW (ref >60)
Glucose, Bld: 135 mg/dL — ABNORMAL HIGH (ref 70–99)
Glucose, Bld: 118 mg/dL — ABNORMAL HIGH (ref 70–99)
Phosphorus: 4.5 mg/dL (ref 2.5–4.6)
Phosphorus: 3.8 mg/dL (ref 2.5–4.6)
Potassium: 5 mmol/L (ref 3.5–5.1)
Potassium: 4.1 mmol/L (ref 3.5–5.1)
Sodium: 132 mmol/L — ABNORMAL LOW (ref 135–145)
Sodium: 133 mmol/L — ABNORMAL LOW (ref 135–145)

## 2023-01-31 LAB — GLUCOSE, CAPILLARY
Glucose-Capillary: 112 mg/dL — ABNORMAL HIGH (ref 70–99)
Glucose-Capillary: 116 mg/dL — ABNORMAL HIGH (ref 70–99)
Glucose-Capillary: 117 mg/dL — ABNORMAL HIGH (ref 70–99)
Glucose-Capillary: 121 mg/dL — ABNORMAL HIGH (ref 70–99)
Glucose-Capillary: 130 mg/dL — ABNORMAL HIGH (ref 70–99)
Glucose-Capillary: 134 mg/dL — ABNORMAL HIGH (ref 70–99)

## 2023-01-31 LAB — PROTIME-INR
INR: 2.3 — ABNORMAL HIGH (ref 0.8–1.2)
Prothrombin Time: 25.1 seconds — ABNORMAL HIGH (ref 11.4–15.2)

## 2023-01-31 LAB — CBC
HCT: 29.7 % — ABNORMAL LOW (ref 39.0–52.0)
Hemoglobin: 8.7 g/dL — ABNORMAL LOW (ref 13.0–17.0)
MCH: 26.7 pg (ref 26.0–34.0)
MCHC: 29.3 g/dL — ABNORMAL LOW (ref 30.0–36.0)
MCV: 91.1 fL (ref 80.0–100.0)
Platelets: 123 10*3/uL — ABNORMAL LOW (ref 150–400)
RBC: 3.26 MIL/uL — ABNORMAL LOW (ref 4.22–5.81)
RDW: 20.4 % — ABNORMAL HIGH (ref 11.5–15.5)
WBC: 8.3 10*3/uL (ref 4.0–10.5)
nRBC: 1.3 % — ABNORMAL HIGH (ref 0.0–0.2)

## 2023-01-31 LAB — APTT: aPTT: 47 seconds — ABNORMAL HIGH (ref 24–36)

## 2023-01-31 LAB — MAGNESIUM: Magnesium: 2.9 mg/dL — ABNORMAL HIGH (ref 1.7–2.4)

## 2023-01-31 MED ORDER — PRISMASOL BGK 0/2.5 32-2.5 MEQ/L EC SOLN
Status: DC
  Filled 2023-01-31 (×6): qty 5000

## 2023-01-31 MED ORDER — ALBUMIN HUMAN 25 % IV SOLN
50.0000 g | Freq: Two times a day (BID) | INTRAVENOUS | Status: AC
  Administered 2023-01-31 (×2): 50 g via INTRAVENOUS
  Filled 2023-01-31 (×2): qty 200

## 2023-01-31 NOTE — Progress Notes (Signed)
[name] 5:06 PM  Subjective: Patient seen and examined and case discussed with the relative at bedside and case discussed with my partner Dr. Lorenso Quarry and hospital computer chart reviewed and his abdominal distention is reported to me as better and his x-ray was reviewed  Objective: Tachycardic otherwise seemingly stable on the ventilator not responding abdomen slight distention no obvious tenderness no obvious bowel sounds no bowel movements reported labs about the same  Assessment: Multiple medical problems stemming from cirrhosis versus alcohol hepatitis  Plan: Continue supportive care per renal and ICU team if no more vomiting and abdominal exam unchanged consider retrying tube feeds tomorrow and will ask my rounding partner to check on tomorrow as well  [facility] E  office [phone] After 5PM or if no answer call [phone]

## 2023-01-31 NOTE — Progress Notes (Signed)
Bilateral lower extremity venous duplex has been completed. Preliminary results can be found in CV Proc through chart review.  Results were given to Dr. Isaiah Serge.   01/31/23 11:59 AM Olen Cordial RVT

## 2023-01-31 NOTE — Progress Notes (Signed)
[facility] Progress Note  Subjective: pt seen in room, HR continues to rise up into the 130s today. I/O yest was net neg 4.6 L. Keeping even now due to drop in BP's.   Vitals:   01/31/23 1100 01/31/23 1115 01/31/23 1121 01/31/23 1130  BP: (!) 114/58 (!) 112/58  118/81  Pulse: (!) 132 (!) 132  (!) 131  Resp: 20 20  20   Temp:      TempSrc:      SpO2: 92% 93% 93% 96%  Weight:      Height:        Exam: General: obese BM-  sedated on the vent Heart: RRR Lungs: dec BS at bases Abdomen: soft, dec'd BS Extremities: diffuse bilat 3+ LE edema, 1-2+ UE edema Skin: warm and dry Neuro: sedated    UA 4/26 - prot 30, rbc 21-50 rbc, 6-10 wbc, rare bact  UNa - 45   UCr - 248   Renal US - 12.1/ 11.9 cm kidneys w/o hydro or mass   B/l creatinine = 0.50 from 11/01/22       Assessment/ Plan: AKI - b/l creat 0.50 from 11/01/22. Creat here was 1.6 on admission 4/25 and has risen up to 2.8 and then 4.1 at time of consult. Pt developed shock on 4/28 requiring pressors for 1-2 days and UOP dropped off around the same time. UNa was 45. Suspected ATN or hemodynamic AKI due to shock and/or hyperbilirubinemia. HRS is less likely - it is usually slower to evolve and UNa should be < 10. Pt w/ severe vol overload. Started time-limited trial of CRRT on 4/29. Large UF 3-4 L net neg the last 2 days, however HR has been rising since CRRT started and today HR is in the 130s and BP's dropping. Keeping even for now until BP's are better and HR < 120. Check CVP's. Cont CRRT for now.  Shock - SP pressor support 4/28- 4/29. Remains off pressors today.  Hyperkalemia - K+ still a little high will change all fluids to 2/2.5.  Vol overload - total I/O here are still +3L. Getting fluid off w/ CRRT but not tolerating well given high HR's as above ETOH abuse Decomp alcoholic cirrhosis  AHRF - w/ vol overload +/- aspiration. Repeat CXR 5/3 today no change.  Anemia - transfuse prn   Vinson Moselle MD  [facility] 01/31/2023, 11:53 AM  Recent Labs  Lab 01/30/23 0241 01/30/23 1557 01/31/23 0258  HGB 8.2*  --  8.7*  ALBUMIN 3.4* 3.3* 3.2*  3.2*  CALCIUM  --  8.7* 8.6*  PHOS  --  4.5 4.5  CREATININE  --  2.65* 2.60*  K  --  5.1 5.0    No results for input(s): "IRON", "TIBC", "FERRITIN" in the last 168 hours. Inpatient medications:  Chlorhexidine Gluconate Cloth  6 each Topical Q0600   docusate  100 mg Per Tube BID   feeding supplement (PROSource TF20)  60 mL Per Tube TID   folic acid  1 mg Per Tube Daily   insulin aspart  0-15 Units Subcutaneous Q4H   lactulose  20 g Per Tube BID   leptospermum manuka honey  1 Application Topical Daily   metoCLOPramide  10 mg Per Tube Q6H   metoprolol tartrate  5 mg Intravenous STAT   midodrine  5 mg Per Tube TID WC   multivitamin  15 mL Per Tube Daily   mouth rinse  15 mL Mouth Rinse Q2H   pantoprazole (PROTONIX) IV  40 mg Intravenous Q12H   polyethylene glycol  17 g Per Tube Daily   prednisoLONE  40 mg Per Tube Daily   rifaximin  550 mg Per Tube BID   thiamine  100 mg Per Tube Daily   Or   thiamine  100 mg Intravenous Daily    sodium chloride Stopped (01/28/23 1435)   albumin human 60 mL/hr at 01/31/23 1100   dexmedetomidine (PRECEDEX) IV infusion 1.1 mcg/kg/hr (01/31/23 1106)   feeding supplement (VITAL 1.5 CAL) Stopped (01/31/23 0520)   fentaNYL infusion INTRAVENOUS 200 mcg/hr (01/31/23 1100)   heparin 10,000 units/ 20 mL infusion syringe 500 Units/hr (01/30/23 2205)   prismasol BGK 2/2.5 dialysis solution 1,500 mL/hr at 01/31/23 0836   prismasol BGK 2/2.5 replacement solution     prismasol BGK 2/2.5 replacement solution     bisacodyl, fentaNYL, heparin, ipratropium-albuterol, midazolam, ondansetron (ZOFRAN) IV, mouth rinse, polyethylene glycol

## 2023-01-31 NOTE — Progress Notes (Signed)
eLink Physician-Brief Progress Note Patient Name: [name] DOB: 1985/12/28 MRN: [id number]   Date of Service  01/31/2023  HPI/Events of Note  37 year old male with a history of cirrhosis and varices complicated by hemorrhagic shock with acute blood loss anemia with superimposed septic shock with MSSA tracheobronchitis.  He then developed a ileus and has been poorly responsive to bowel care thus far.  This morning, he is having brown sputum coming up, looks similar to tube feeds.  Tube feeds are now held    eICU Interventions  Will repeat chest x-ray and KUB.  Obtain sputum cultures.  Continue to monitor.     Intervention Category Minor Interventions: Clinical assessment - ordering diagnostic tests  Emalynn Clewis 01/31/2023, 6:13 AM

## 2023-01-31 NOTE — Progress Notes (Signed)
Nutrition Follow-up  DOCUMENTATION CODES:   Not applicable  INTERVENTION:  - Restart TF when medically appropriate.  Patient noted to have an ileus with no BM since admit (8 days).  Vital 1.5 at 60 ml/h (1440 ml per day) Prosource TF20 60 ml TID Provides 2400 kcal, 157 gm protein, 1100 ml free water daily  - Monitor weight trends.   NUTRITION DIAGNOSIS:   Inadequate oral intake related to inability to eat as evidenced by NPO status. *ongoing  GOAL:   Patient will meet greater than or equal to 90% of their needs *unmet  MONITOR:   Vent status, Labs, Weight trends  REASON FOR ASSESSMENT:   Ventilator    ASSESSMENT:   37 y.o. male with PMH etoh cirrhosis (sober for 8 months but relapsed recently) who was admitted for weakness and hemoglobin of 3.2  4/25 Admit 4/26 intubated 4/28 TF started 4/30 CRRT started  Patient had been infusing at goal rate of 41mL/hr but early this AM patient noted to have brown sputum which looked similar to tube feeds to tube feeds now on hold.  Patient had abdominal xray which showed bowel dilation consistent with ileus. He has not had a bowel movement since admit 8 days ago. Abdomen distended and very firm upon palpation.  Patient remains on time-limited trial of CRRT. Heart rate has been increasing and blood pressures soft.  Admit weight: 220# Current weight: 257# Weight slowly coming down with fluid removal. Was +13.8L at last assessment on 4/30 and today is only +3.5L.  Palliative care continue to follow.    Medications reviewed and include: Colace, Miralax, Lactulose, Reglan, Dulcolax (prn), Folic acid, MVI  Labs reviewed:  Na 132 Creatinine 2.60 Magnesium 2.9   Diet Order:   Diet Order             Diet NPO time specified Except for: Sips with Meds  Diet effective now                   EDUCATION NEEDS:  Education needs have been addressed  Skin:  Skin Assessment: Skin Integrity Issues: Skin Integrity  Issues:: Other (Comment), Stage II Stage II: R ear Other: non-pressure wound to distal pretibial  Last BM:  PTA  Height:  Ht Readings from Last 1 Encounters:  01/27/23 5\' 9"  (1.753 m)   Weight:  Wt Readings from Last 1 Encounters:  01/31/23 116.9 kg   Ideal Body Weight:  72.73 kg  BMI:  Body mass index is 38.06 kg/m.  Estimated Nutritional Needs:  Kcal:  2250-2500 kcals Protein:  130-175 grams Fluid:  >/= 2.2L    Shelle Iron RD, LDN For contact information, refer to [facility].

## 2023-01-31 NOTE — Progress Notes (Addendum)
NAME:  [name], MRN:  [id number], DOB:  May 19, 1986, LOS: 8 ADMISSION DATE:  01/22/2023, CONSULTATION DATE:  4/28 REFERRING MD:  Dr. Eloise Harman , CHIEF COMPLAINT:  Weakness    History of Present Illness:  37 year old man presents to the ED with weakness found to have hemoglobin of 3.   He denies any hematemesis.  No hemoptysis.  No hematochezia.  Denies melena.  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  Last drink 2 days prior to admission.  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  He was hospitalized 10/2022 with GI bleed.  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  Also some portal hypertensive gastropathy.  At home he takes Lasix and spironolactone for lower extremity edema.  He also takes lactulose.   Upon arrival, he was tachycardic to the 170s and SVT.  He was cardioverted.  He remained normotensive.  He remains tachycardic but less so.  His T. bili seems at baseline.  LFTs seem at recent baseline as well.  Last month notable for acute renal failure, severe elevation in creatinine. Denied signs of GI bleeding. Initial PLTs 222, INR 2.5, scr 1.6 (baseline 0.5 range). Got blood (4 units PRBC) admitted to ICU. GI consulted. Placed on Octreotide + PPI infusion. GI recommended CT angio abdomen and pelvis with BRTO. Empiric CTX initiated.  Pertinent  Medical History  Alcoholic Cirrhosis with hx of Varices, Portal Hypertensive Gastropathy   Significant Hospital Events: Including procedures, antibiotic start and stop dates in addition to other pertinent events   4/25 Admit with weakness in setting of Hgb 3. GI consulted. Octreotide + PPI. Empiric CTX.  4/26 Increased WOB, gross hematuria w/clots.  CTA Abd/pelvis showed cirrhotic liver with borderline splenomegaly, large portosystemic venous collaterals, small gastric & esophageal varices, no ascites, cholelithiasis, small bilateral pleural effusions R>L. Hgb 5.3  overnight, 2 more units PRBC and lasix. Urology evaluation, no blood in foley after insertion.  Intubated.  MELDNa 33. IR consulted, not a candidate for TIPS b/c of advanced MELD score and vascular anatomy. No response to lasix. MRSA PCR neg.   4/27 Remains on vent with propofol/fentanyl, octreotide. Worsening AKI. EGD showed small esophageal varices, no evidence of active bleeding, OGT left in place.  4/28 Failed SBT. On fentanyl. Jaundice with worsening bili/INR.  Worsening creatinine, decreasing UOP. BP soft, Hgb <7, 1 unit PRBC given.  4/29 Failed SBT, intermittent vent dyssynchrony 4/30 started on CRRT 5/2 SVT episode overnight, resolved with 1 dose Versed.  Interim History / Subjective:   Remains on CRRT.  Volume removal stopped today for increasing heart rate and soft blood pressure   Objective   Blood pressure 110/65, pulse (!) 133, temperature 99.4 F (37.4 C), temperature source Axillary, resp. rate (!) 25, height 5\' 9"  (1.753 m), weight 116.9 kg, SpO2 96 %.    Vent Mode: PCV FiO2 (%):  [30 %-35 %] 30 % Set Rate:  [20 bmp] 20 bmp PEEP:  [8 cmH20] 8 cmH20 Plateau Pressure:  [11 cmH20-23 cmH20] 23 cmH20   Intake/Output Summary (Last 24 hours) at 01/31/2023 0835 Last data filed at 01/31/2023 0800 Gross per 24 hour  Intake 3338.79 ml  Output 8287.6 ml  Net -4948.81 ml   Filed Weights   01/29/23 0500 01/30/23 0500 01/31/23 0500  Weight: 126.9 kg 122.2 kg 116.9 kg    Examination: Blood pressure 110/65, pulse (!) 133, temperature 99.4 F (37.4 C), temperature source Axillary, resp. rate Marland Kitchen)  25, height 5\' 9"  (1.753 m), weight 116.9 kg, SpO2 96 %. Gen:      No acute distress HEENT:  EOMI, sclera anicteric Neck:     No masses; no thyromegaly, ET tube Lungs:    Clear to auscultation bilaterally; normal respiratory effort CV:         Regular rate and rhythm; no murmurs Abd:      + bowel sounds; soft, non-tender; no palpable masses, no distension Ext:    1+ edema; adequate  peripheral perfusion Skin:      Warm and dry; no rash Neuro: Sedated, unresponsive  Lab/imaging reviewed Significant for sodium 132, BUN/creatinine 46/2.60 AST 52, ALT 21, total bilirubin 14.4 Hemoglobin 8.7, platelets 123, INR 2.3  Chest x-ray and abdominal x-ray with stable bilateral lung opacities, small bowel dilatation consistent with ileus  Tracheal Aspirate 4/26 > MSSA   Assessment & Plan:   Acute Blood Loss Anemia  In setting of hematuria, GIB. Largest contributor suspected to be GI given hx of cirrhosis, prior EGD findings and portal hypertensive gastropathy with varices. No hematemesis this admit.  Had significant hematuria with clots but no blood in foley on insertion - thought by Urology that he could have had a uretheral lesion that foley created tamponade. Varix on EGD but not banded. Hx recurrent gum bleeding per brother.  -appreciate GI, Urology, IR  -Follow CBC, hemoglobin Transfuse for hemoglobin less than 7 Continue PPI twice daily GI is following  Decompensated Alcoholic Cirrhosis  ETOH Abuse  Nodular contour on RUQ [place], confirmed cirrhosis on CTA ABD.  Was sober for 8 months prior to admit, relapsed 2 days prior to admit and that was last drink. Maddrey's Discriminant Function Score 68 4/27. MELD 33. Worsening T. Bili/INR. Hepatitis panel negative.  -lactulose, xifaxan PT  -continue prednisolone 40mg  QD, started 4/27  -follow INR/t.bili trend  - GI following, appreciate the assistance  Ileus Hold tube feeds.  NG tube to low intermittent suction  Acute Hepatic / Metabolic Encephalopathy  -PAD protocol with fentanyl and precedex, PRN versed  Wean sedation as able Thiamine, folic acid, MVI  Acute Hypoxic Respiratory Failure in setting of Pulmonary Edema, Possible MSSA Aspiration PNA vs Tracheobronchitis s/p course of CTX Continue vent support Deferring pressure support weans until he is more stable.  Will need PEEP to be reduced to 5 Follow intermittent  chest x-ray  Tachycardia Stop fluid removal and give IV albumin Check EKG troponin Check LE duplex to r/o DVT. Cannot do CTA due to renal failure  AKI - improving on CRRT which was started 4/30 Hyponatremia  UNa 45, more consistent with ATN due to shock/ischemic perfusion, hyperbilirubinemia.  HRS less likely (urine Na should be <10) -appreciate Nephrology  -Continue CRRT.  Repeat albumin dose -Continue midodrine -Trend BMP / urinary output -Replace electrolytes as indicated -Avoid nephrotoxic agents, ensure adequate renal perfusion  Hematuria / Clots  Interestingly, no blood in foley once placed, ? Urethral lesion  -follow UOP -appreciate Urology, if need they can place irrigation foley   LLE Wound  3cm x 2cm x 0.4cm LE lesion  Wound care  Hyperglycemia  Steroid contribution  -glucose well controlled -SSI, moderate scale    Constipation and possible ileus - Miralax PRN, Ducosate daily - Continue Dulcolax, Reglan  Best Practice (right click and "Reselect all SmartList Selections" daily)  Diet/type: NPO DVT prophylaxis: SCD. Defer chemical prophylaxis due to concern for bleed GI prophylaxis: PPI Lines: Dialysis Catheter Foley:  Yes, and it is still needed  Code Status:  full code  Last date of multidisciplinary goals of care discussion: Cousin updated at bedside 4/30.  Brother, Amada Jupiter, updated via phone.  Reviewed nature of critical illness, renal lab findings with concerns for ATN.  Discussed utility of time limited trial of HD but that he is critically ill.  There is some hope of renal recovery if ATN (urine studies consistent with such) but his liver may be the defining issue in terms of his recovery from ICU illness. He indicates understanding and is in support of time limited trial of HD. If no recovery, he understands that we may be facing discussion of transition to comfort measures.   Amada Jupiter indicates his parents to make any decisions regarding his brothers care in  terms of end of life (although he recognizes we are not at that point yet).   Asad Vanduyne, father, [phone] Ladamion Keusch, [phone]  Critical care time: 35 minutes   The patient is critically ill with multiple organ system failure and requires high complexity decision making for assessment and support, frequent evaluation and titration of therapies, advanced monitoring, review of radiographic studies and interpretation of complex data.   Critical Care Time devoted to patient care services, exclusive of separately billable procedures, described in this note is 35 minutes.   Chilton Greathouse MD [facility] See Amion for pager  If no response to pager , please call [phone] until 7pm After 7:00 pm call Elink  [phone] 01/31/2023, 8:36 AM

## 2023-02-01 ENCOUNTER — Inpatient Hospital Stay (HOSPITAL_COMMUNITY): Payer: Medicaid Other

## 2023-02-01 DIAGNOSIS — J9601 Acute respiratory failure with hypoxia: Secondary | ICD-10-CM | POA: Diagnosis not present

## 2023-02-01 DIAGNOSIS — Z515 Encounter for palliative care: Secondary | ICD-10-CM | POA: Diagnosis not present

## 2023-02-01 DIAGNOSIS — Z7189 Other specified counseling: Secondary | ICD-10-CM | POA: Diagnosis not present

## 2023-02-01 DIAGNOSIS — R531 Weakness: Secondary | ICD-10-CM | POA: Diagnosis not present

## 2023-02-01 LAB — RENAL FUNCTION PANEL
Albumin: 3.5 g/dL (ref 3.5–5.0)
Albumin: 3.1 g/dL — ABNORMAL LOW (ref 3.5–5.0)
Anion gap: 11 (ref 5–15)
Anion gap: 9 (ref 5–15)
BUN: 43 mg/dL — ABNORMAL HIGH (ref 6–20)
BUN: 42 mg/dL — ABNORMAL HIGH (ref 6–20)
CO2: 23 mmol/L (ref 22–32)
CO2: 23 mmol/L (ref 22–32)
Calcium: 8.8 mg/dL — ABNORMAL LOW (ref 8.9–10.3)
Calcium: 8.6 mg/dL — ABNORMAL LOW (ref 8.9–10.3)
Chloride: 98 mmol/L (ref 98–111)
Chloride: 98 mmol/L (ref 98–111)
Creatinine, Ser: 2.57 mg/dL — ABNORMAL HIGH (ref 0.61–1.24)
Creatinine, Ser: 2.35 mg/dL — ABNORMAL HIGH (ref 0.61–1.24)
GFR, Estimated: 32 mL/min — ABNORMAL LOW (ref >60)
GFR, Estimated: 35 mL/min — ABNORMAL LOW (ref >60)
Glucose, Bld: 107 mg/dL — ABNORMAL HIGH (ref 70–99)
Glucose, Bld: 131 mg/dL — ABNORMAL HIGH (ref 70–99)
Phosphorus: 3.3 mg/dL (ref 2.5–4.6)
Phosphorus: 3.9 mg/dL (ref 2.5–4.6)
Potassium: 3.8 mmol/L (ref 3.5–5.1)
Potassium: 3.9 mmol/L (ref 3.5–5.1)
Sodium: 132 mmol/L — ABNORMAL LOW (ref 135–145)
Sodium: 130 mmol/L — ABNORMAL LOW (ref 135–145)

## 2023-02-01 LAB — COMPREHENSIVE METABOLIC PANEL
ALT: 22 U/L (ref 0–44)
AST: 44 U/L — ABNORMAL HIGH (ref 15–41)
Albumin: 3.5 g/dL (ref 3.5–5.0)
Alkaline Phosphatase: 48 U/L (ref 38–126)
Anion gap: 11 (ref 5–15)
BUN: 43 mg/dL — ABNORMAL HIGH (ref 6–20)
CO2: 23 mmol/L (ref 22–32)
Calcium: 8.8 mg/dL — ABNORMAL LOW (ref 8.9–10.3)
Chloride: 98 mmol/L (ref 98–111)
Creatinine, Ser: 2.64 mg/dL — ABNORMAL HIGH (ref 0.61–1.24)
GFR, Estimated: 31 mL/min — ABNORMAL LOW (ref >60)
Glucose, Bld: 107 mg/dL — ABNORMAL HIGH (ref 70–99)
Potassium: 3.8 mmol/L (ref 3.5–5.1)
Sodium: 132 mmol/L — ABNORMAL LOW (ref 135–145)
Total Bilirubin: 15 mg/dL — ABNORMAL HIGH (ref 0.3–1.2)
Total Protein: 8.5 g/dL — ABNORMAL HIGH (ref 6.5–8.1)

## 2023-02-01 LAB — GLUCOSE, CAPILLARY
Glucose-Capillary: 107 mg/dL — ABNORMAL HIGH (ref 70–99)
Glucose-Capillary: 111 mg/dL — ABNORMAL HIGH (ref 70–99)
Glucose-Capillary: 114 mg/dL — ABNORMAL HIGH (ref 70–99)
Glucose-Capillary: 119 mg/dL — ABNORMAL HIGH (ref 70–99)
Glucose-Capillary: 119 mg/dL — ABNORMAL HIGH (ref 70–99)
Glucose-Capillary: 122 mg/dL — ABNORMAL HIGH (ref 70–99)

## 2023-02-01 LAB — CBC
HCT: 29.3 % — ABNORMAL LOW (ref 39.0–52.0)
Hemoglobin: 8.9 g/dL — ABNORMAL LOW (ref 13.0–17.0)
MCH: 26.6 pg (ref 26.0–34.0)
MCHC: 30.4 g/dL (ref 30.0–36.0)
MCV: 87.7 fL (ref 80.0–100.0)
Platelets: 108 10*3/uL — ABNORMAL LOW (ref 150–400)
RBC: 3.34 MIL/uL — ABNORMAL LOW (ref 4.22–5.81)
RDW: 20.6 % — ABNORMAL HIGH (ref 11.5–15.5)
WBC: 6.4 10*3/uL (ref 4.0–10.5)
nRBC: 1.9 % — ABNORMAL HIGH (ref 0.0–0.2)

## 2023-02-01 LAB — APTT: aPTT: 49 seconds — ABNORMAL HIGH (ref 24–36)

## 2023-02-01 LAB — MAGNESIUM: Magnesium: 2.8 mg/dL — ABNORMAL HIGH (ref 1.7–2.4)

## 2023-02-01 MED ORDER — PRISMASOL BGK 4/2.5 32-4-2.5 MEQ/L EC SOLN: Status: DC

## 2023-02-01 NOTE — Progress Notes (Signed)
Subjective: Remains intubated, on CRRT. As per nurse, what ever medication and lactulose are given, most of them come back during NG suction despite clamping for over an hour after giving meds. He has not had any bowel movements.  Objective: Vital signs in last 24 hours: Temp:  [98.4 F (36.9 C)-98.8 F (37.1 C)] 98.5 F (36.9 C) (05/04 1000) Pulse Rate:  [89-186] 95 (05/04 1200) Resp:  [18-27] 20 (05/04 1200) BP: (109-159)/(47-101) 144/73 (05/04 1200) SpO2:  [90 %-100 %] 98 % (05/04 1200) FiO2 (%):  [40 %] 40 % (05/04 1200) Weight:  [116.3 kg] 116.3 kg (05/04 0400) Weight change: -0.6 kg Last BM Date : 02/01/23  PE: Intubated, sedated GENERAL: Icteric  ABDOMEN: Distended abdomen, bowel sounds not heard EXTREMITIES: No deformity  Lab Results: Results for orders placed or performed during the hospital encounter of 02/09/2023 (from the past 48 hour(s))  Glucose, capillary     Status: Abnormal   Collection Time: 01/30/23  3:03 PM  Result Value Ref Range   Glucose-Capillary 135 (H) 70 - 99 mg/dL    Comment: Glucose reference range applies only to samples taken after fasting for at least 8 hours.   Comment 1 Notify RN    Comment 2 Document in Chart   Renal function panel (daily at 1600)     Status: Abnormal   Collection Time: 01/30/23  3:57 PM  Result Value Ref Range   Sodium 133 (L) 135 - 145 mmol/L   Potassium 5.1 3.5 - 5.1 mmol/L   Chloride 100 98 - 111 mmol/L   CO2 23 22 - 32 mmol/L   Glucose, Bld 130 (H) 70 - 99 mg/dL    Comment: Glucose reference range applies only to samples taken after fasting for at least 8 hours.   BUN 43 (H) 6 - 20 mg/dL   Creatinine, Ser 6.57 (H) 0.61 - 1.24 mg/dL   Calcium 8.7 (L) 8.9 - 10.3 mg/dL   Phosphorus 4.5 2.5 - 4.6 mg/dL   Albumin 3.3 (L) 3.5 - 5.0 g/dL   GFR, Estimated 30 (L) >60 mL/min    Comment: (NOTE) Calculated using the CKD-EPI Creatinine Equation (2021)    Anion gap 10 5 - 15    Comment: Performed at [facility], [address] [place], [place] [address]  Glucose, capillary     Status: Abnormal   Collection Time: 01/30/23  7:25 PM  Result Value Ref Range   Glucose-Capillary 126 (H) 70 - 99 mg/dL    Comment: Glucose reference range applies only to samples taken after fasting for at least 8 hours.  Glucose, capillary     Status: Abnormal   Collection Time: 01/30/23 11:21 PM  Result Value Ref Range   Glucose-Capillary 152 (H) 70 - 99 mg/dL    Comment: Glucose reference range applies only to samples taken after fasting for at least 8 hours.  Renal function panel (daily at 0500)     Status: Abnormal   Collection Time: 01/31/23  2:58 AM  Result Value Ref Range   Sodium 132 (L) 135 - 145 mmol/L   Potassium 5.0 3.5 - 5.1 mmol/L   Chloride 100 98 - 111 mmol/L   CO2 23 22 - 32 mmol/L   Glucose, Bld 135 (H) 70 - 99 mg/dL    Comment: Glucose reference range applies only to samples taken after fasting for at least 8 hours.   BUN 46 (H) 6 - 20 mg/dL   Creatinine, Ser 2.95 (H) 0.61 -  1.24 mg/dL   Calcium 8.6 (L) 8.9 - 10.3 mg/dL   Phosphorus 4.5 2.5 - 4.6 mg/dL    Comment: ICTERUS AT THIS LEVEL MAY AFFECT RESULT   Albumin 3.2 (L) 3.5 - 5.0 g/dL   GFR, Estimated 31 (L) >60 mL/min    Comment: (NOTE) Calculated using the CKD-EPI Creatinine Equation (2021)    Anion gap 9 5 - 15    Comment: Performed at [facility], [address], [place], [place] [address]  Magnesium     Status: Abnormal   Collection Time: 01/31/23  2:58 AM  Result Value Ref Range   Magnesium 2.9 (H) 1.7 - 2.4 mg/dL    Comment: Performed at [facility], [address] [place], [place] [address]  CBC     Status: Abnormal   Collection Time: 01/31/23  2:58 AM  Result Value Ref Range   WBC 8.3 4.0 - 10.5 K/uL   RBC 3.26 (L) 4.22 - 5.81 MIL/uL   Hemoglobin 8.7 (L) 13.0 - 17.0 g/dL   HCT 09.8 (L) 11.9 - 14.7 %   MCV 91.1 80.0 - 100.0 fL   MCH 26.7 26.0 - 34.0 pg   MCHC 29.3 (L) 30.0 -  36.0 g/dL   RDW 82.9 (H) 56.2 - 13.0 %   Platelets 123 (L) 150 - 400 K/uL    Comment: SPECIMEN CHECKED FOR CLOTS CONSISTENT WITH PREVIOUS RESULT REPEATED TO VERIFY    nRBC 1.3 (H) 0.0 - 0.2 %    Comment: Performed at [facility], [address], [place], [place] [address]  Hepatic function panel     Status: Abnormal   Collection Time: 01/31/23  2:58 AM  Result Value Ref Range   Total Protein 9.1 (H) 6.5 - 8.1 g/dL   Albumin 3.2 (L) 3.5 - 5.0 g/dL   AST 52 (H) 15 - 41 U/L   ALT 21 0 - 44 U/L   Alkaline Phosphatase 67 38 - 126 U/L   Total Bilirubin 14.4 (H) 0.3 - 1.2 mg/dL   Bilirubin, Direct 6.9 (H) 0.0 - 0.2 mg/dL   Indirect Bilirubin 7.5 (H) 0.3 - 0.9 mg/dL    Comment: Performed at [facility], [address], [place], [place] [address]  Protime-INR     Status: Abnormal   Collection Time: 01/31/23  2:58 AM  Result Value Ref Range   Prothrombin Time 25.1 (H) 11.4 - 15.2 seconds   INR 2.3 (H) 0.8 - 1.2    Comment: (NOTE) INR goal varies based on device and disease states. Performed at [facility], [address] [place], [place] [address]   APTT     Status: Abnormal   Collection Time: 01/31/23  2:58 AM  Result Value Ref Range   aPTT 47 (H) 24 - 36 seconds    Comment:        IF BASELINE aPTT IS ELEVATED, SUGGEST PATIENT RISK ASSESSMENT BE USED TO DETERMINE APPROPRIATE ANTICOAGULANT THERAPY. Performed at [facility], [address] [place], [place] [address]   Glucose, capillary     Status: Abnormal   Collection Time: 01/31/23  3:30 AM  Result Value Ref Range   Glucose-Capillary 134 (H) 70 - 99 mg/dL    Comment: Glucose reference range applies only to samples taken after fasting for at least 8 hours.  Culture, Respiratory w Gram Stain     Status: None (Preliminary result)   Collection Time: 01/31/23  6:15 AM  Specimen: Tracheal Aspirate; Respiratory  Result Value Ref Range   Specimen  Description      TRACHEAL ASPIRATE Performed at [facility], [address], [place], [place] [address]    Special Requests      NONE Performed at [facility], [address], [place], [place] [address]    Gram Stain      ABUNDANT WBC PRESENT, PREDOMINANTLY PMN RARE GRAM NEGATIVE RODS    Culture      CULTURE REINCUBATED FOR BETTER GROWTH Performed at [facility], [address], [place], [place] [address]    Report Status PENDING   Glucose, capillary     Status: Abnormal   Collection Time: 01/31/23  7:55 AM  Result Value Ref Range   Glucose-Capillary 130 (H) 70 - 99 mg/dL    Comment: Glucose reference range applies only to samples taken after fasting for at least 8 hours.   Comment 1 Notify RN    Comment 2 Document in Chart   Troponin I (High Sensitivity)     Status: Abnormal   Collection Time: 01/31/23 11:37 AM  Result Value Ref Range   Troponin I (High Sensitivity) 20 (H) <18 ng/L    Comment: (NOTE) Elevated high sensitivity troponin I (hsTnI) values and significant  changes across serial measurements may suggest ACS but many other  chronic and acute conditions are known to elevate hsTnI results.  Refer to the "Links" section for chest pain algorithms and additional  guidance. Performed at [facility], [address] [place], [place] [address]   Glucose, capillary     Status: Abnormal   Collection Time: 01/31/23 12:51 PM  Result Value Ref Range   Glucose-Capillary 116 (H) 70 - 99 mg/dL    Comment: Glucose reference range applies only to samples taken after fasting for at least 8 hours.   Comment 1 Notify RN    Comment 2 Document in Chart   Troponin I (High Sensitivity)     Status: Abnormal   Collection Time: 01/31/23  1:24 PM  Result Value Ref Range   Troponin I (High Sensitivity) 24 (H) <18 ng/L    Comment: (NOTE) Elevated high sensitivity troponin I (hsTnI) values and significant  changes across  serial measurements may suggest ACS but many other  chronic and acute conditions are known to elevate hsTnI results.  Refer to the "Links" section for chest pain algorithms and additional  guidance. Performed at [facility], [address] [place], [place] [address]   Renal function panel (daily at 1600)     Status: Abnormal   Collection Time: 01/31/23  3:31 PM  Result Value Ref Range   Sodium 133 (L) 135 - 145 mmol/L   Potassium 4.1 3.5 - 5.1 mmol/L   Chloride 101 98 - 111 mmol/L   CO2 23 22 - 32 mmol/L   Glucose, Bld 118 (H) 70 - 99 mg/dL    Comment: Glucose reference range applies only to samples taken after fasting for at least 8 hours.   BUN 46 (H) 6 - 20 mg/dL   Creatinine, Ser 1.30 (H) 0.61 - 1.24 mg/dL   Calcium 8.8 (L) 8.9 - 10.3 mg/dL   Phosphorus 3.8 2.5 - 4.6 mg/dL    Comment: ICTERUS AT THIS LEVEL MAY AFFECT RESULT   Albumin 3.4 (L) 3.5 - 5.0 g/dL   GFR, Estimated 29 (L) >60 mL/min    Comment: (NOTE) Calculated using the CKD-EPI Creatinine Equation (2021)  Anion gap 9 5 - 15    Comment: Performed at [facility], [address] [place], [place] [address]  Glucose, capillary     Status: Abnormal   Collection Time: 01/31/23  4:03 PM  Result Value Ref Range   Glucose-Capillary 121 (H) 70 - 99 mg/dL    Comment: Glucose reference range applies only to samples taken after fasting for at least 8 hours.  Glucose, capillary     Status: Abnormal   Collection Time: 01/31/23  8:00 PM  Result Value Ref Range   Glucose-Capillary 117 (H) 70 - 99 mg/dL    Comment: Glucose reference range applies only to samples taken after fasting for at least 8 hours.  Glucose, capillary     Status: Abnormal   Collection Time: 01/31/23 11:50 PM  Result Value Ref Range   Glucose-Capillary 112 (H) 70 - 99 mg/dL    Comment: Glucose reference range applies only to samples taken after fasting for at least 8 hours.  Renal function panel (daily at 0500)      Status: Abnormal   Collection Time: 02/01/23  2:49 AM  Result Value Ref Range   Sodium 132 (L) 135 - 145 mmol/L   Potassium 3.8 3.5 - 5.1 mmol/L   Chloride 98 98 - 111 mmol/L   CO2 23 22 - 32 mmol/L   Glucose, Bld 107 (H) 70 - 99 mg/dL    Comment: Glucose reference range applies only to samples taken after fasting for at least 8 hours.   BUN 43 (H) 6 - 20 mg/dL   Creatinine, Ser 6.04 (H) 0.61 - 1.24 mg/dL   Calcium 8.8 (L) 8.9 - 10.3 mg/dL   Phosphorus 3.3 2.5 - 4.6 mg/dL    Comment: ICTERUS AT THIS LEVEL MAY AFFECT RESULT   Albumin 3.5 3.5 - 5.0 g/dL   GFR, Estimated 32 (L) >60 mL/min    Comment: (NOTE) Calculated using the CKD-EPI Creatinine Equation (2021)    Anion gap 11 5 - 15    Comment: Performed at [facility], [address] [place], [place] [address]  APTT     Status: Abnormal   Collection Time: 02/01/23  2:49 AM  Result Value Ref Range   aPTT 49 (H) 24 - 36 seconds    Comment:        IF BASELINE aPTT IS ELEVATED, SUGGEST PATIENT RISK ASSESSMENT BE USED TO DETERMINE APPROPRIATE ANTICOAGULANT THERAPY. Performed at [facility], [address] [place], [place] [address]   CBC     Status: Abnormal   Collection Time: 02/01/23  2:49 AM  Result Value Ref Range   WBC 6.4 4.0 - 10.5 K/uL   RBC 3.34 (L) 4.22 - 5.81 MIL/uL   Hemoglobin 8.9 (L) 13.0 - 17.0 g/dL   HCT 78.2 (L) 95.6 - 21.3 %   MCV 87.7 80.0 - 100.0 fL   MCH 26.6 26.0 - 34.0 pg   MCHC 30.4 30.0 - 36.0 g/dL   RDW 08.6 (H) 57.8 - 46.9 %   Platelets 108 (L) 150 - 400 K/uL   nRBC 1.9 (H) 0.0 - 0.2 %    Comment: Performed at [facility], [address] [place], [place] [address]  Comprehensive metabolic panel     Status: Abnormal   Collection Time: 02/01/23  3:34 AM  Result Value Ref Range   Sodium 132 (L) 135 - 145 mmol/L   Potassium 3.8 3.5 - 5.1 mmol/L   Chloride  98 98 - 111 mmol/L   CO2 23 22 - 32 mmol/L   Glucose, Bld 107 (H) 70 - 99 mg/dL     Comment: Glucose reference range applies only to samples taken after fasting for at least 8 hours.   BUN 43 (H) 6 - 20 mg/dL   Creatinine, Ser 4.09 (H) 0.61 - 1.24 mg/dL   Calcium 8.8 (L) 8.9 - 10.3 mg/dL   Total Protein 8.5 (H) 6.5 - 8.1 g/dL   Albumin 3.5 3.5 - 5.0 g/dL   AST 44 (H) 15 - 41 U/L   ALT 22 0 - 44 U/L   Alkaline Phosphatase 48 38 - 126 U/L   Total Bilirubin 15.0 (H) 0.3 - 1.2 mg/dL   GFR, Estimated 31 (L) >60 mL/min    Comment: (NOTE) Calculated using the CKD-EPI Creatinine Equation (2021)    Anion gap 11 5 - 15    Comment: Performed at [facility], [address] [place], [place] [address]  Magnesium     Status: Abnormal   Collection Time: 02/01/23  3:34 AM  Result Value Ref Range   Magnesium 2.8 (H) 1.7 - 2.4 mg/dL    Comment: Performed at [facility], [address] [place], [place] [address]  Glucose, capillary     Status: Abnormal   Collection Time: 02/01/23  4:01 AM  Result Value Ref Range   Glucose-Capillary 107 (H) 70 - 99 mg/dL    Comment: Glucose reference range applies only to samples taken after fasting for at least 8 hours.  Glucose, capillary     Status: Abnormal   Collection Time: 02/01/23  8:21 AM  Result Value Ref Range   Glucose-Capillary 114 (H) 70 - 99 mg/dL    Comment: Glucose reference range applies only to samples taken after fasting for at least 8 hours.  Glucose, capillary     Status: Abnormal   Collection Time: 02/01/23 12:18 PM  Result Value Ref Range   Glucose-Capillary 119 (H) 70 - 99 mg/dL    Comment: Glucose reference range applies only to samples taken after fasting for at least 8 hours.    Studies/Results: DG Abd 1 View  Result Date: 02/01/2023 CLINICAL DATA:  Follow-up ileus. EXAM: ABDOMEN - 1 VIEW COMPARISON:  01/31/2023 and 01/30/2023.  CT, 12/31/2022. FINDINGS: Mildly distended air-filled colon. Several loops of prominent air-filled small bowel also noted. Appearance is similar to  the previous day's exam. Stable nasal/orogastric tube, tip well within the stomach. IMPRESSION: 1. No significant change from the previous day's study. 2. Persistent mild gaseous distension of small bowel and colon consistent with adynamic ileus. Electronically Signed   By: Amie Portland M.D.   On: 02/01/2023 10:07   VAS [place] LOWER EXTREMITY VENOUS (DVT)  Result Date: 01/31/2023  Lower Venous DVT Study Patient Name:  LUE MAYEAUX  Date of Exam:   01/31/2023 Medical Rec #: [id number]       Accession #:    [id number] Date of Birth: Apr 23, 1986        Patient Gender: M Patient Age:   37 years Exam Location:  [facility] Procedure:      VAS [place] LOWER EXTREMITY VENOUS (DVT) Referring Phys: Chilton [place] --------------------------------------------------------------------------------  Indications: Swelling.  Risk Factors: None identified. Limitations: Body habitus, poor ultrasound/tissue interface and patient immobility. Comparison Study: No prior studies. Performing Technologist: Chanda Busing RVT  Examination Guidelines: A complete evaluation includes B-mode imaging, spectral Doppler, color Doppler, and power Doppler as needed of  all accessible portions of each vessel. Bilateral testing is considered an integral part of a complete examination. Limited examinations for reoccurring indications may be performed as noted. The reflux portion of the exam is performed with the patient in reverse Trendelenburg.  +---------+---------------+---------+-----------+----------+-------------------+ RIGHT    CompressibilityPhasicitySpontaneityPropertiesThrombus Aging      +---------+---------------+---------+-----------+----------+-------------------+ CFV      Full           Yes      No                                       +---------+---------------+---------+-----------+----------+-------------------+ SFJ      Full                                                              +---------+---------------+---------+-----------+----------+-------------------+ FV Prox  Full                                                             +---------+---------------+---------+-----------+----------+-------------------+ FV Mid                  Yes      No                                       +---------+---------------+---------+-----------+----------+-------------------+ FV Distal               Yes      No                                       +---------+---------------+---------+-----------+----------+-------------------+ PFV      Full                                                             +---------+---------------+---------+-----------+----------+-------------------+ POP      Full           Yes      No                                       +---------+---------------+---------+-----------+----------+-------------------+ PTV      Full                                                             +---------+---------------+---------+-----------+----------+-------------------+ PERO  Not well visualized +---------+---------------+---------+-----------+----------+-------------------+   +---------+---------------+---------+-----------+----------+--------------+ LEFT     CompressibilityPhasicitySpontaneityPropertiesThrombus Aging +---------+---------------+---------+-----------+----------+--------------+ CFV      Full           Yes      No                                  +---------+---------------+---------+-----------+----------+--------------+ SFJ      Full                                                        +---------+---------------+---------+-----------+----------+--------------+ FV Prox  Full                                                        +---------+---------------+---------+-----------+----------+--------------+ FV Mid                  Yes      No                                   +---------+---------------+---------+-----------+----------+--------------+ FV Distal               Yes      No                                  +---------+---------------+---------+-----------+----------+--------------+ PFV      Full                                                        +---------+---------------+---------+-----------+----------+--------------+ POP      Full           Yes      No                                  +---------+---------------+---------+-----------+----------+--------------+ PTV      Full                                                        +---------+---------------+---------+-----------+----------+--------------+ PERO     Full                                                        +---------+---------------+---------+-----------+----------+--------------+    Summary: RIGHT: - There is no evidence of deep vein thrombosis in the lower extremity. However, portions of this examination were limited- see technologist comments above.  - No cystic structure found in the popliteal fossa.  LEFT: - There is no evidence of deep vein thrombosis in the lower extremity. However, portions of this examination were limited- see technologist comments above.  - No cystic structure found in the popliteal fossa.  *See table(s) above for measurements and observations.    Preliminary    DG Abd 1 View  Result Date: 01/31/2023 CLINICAL DATA:  Abnormal respiration. EXAM: ABDOMEN - 1 VIEW COMPARISON:  Jan 30, 2023. FINDINGS: Stable position nasogastric tube in proximal stomach. Stable large and small bowel dilatation is noted most consistent with ileus. IMPRESSION: Stable bowel dilatation most consistent with ileus. Electronically Signed   By: Lupita Raider M.D.   On: 01/31/2023 08:51   DG CHEST PORT 1 VIEW  Result Date: 01/31/2023 CLINICAL DATA:  Abnormal respiration. EXAM: PORTABLE CHEST 1 VIEW COMPARISON:  Jan 29, 2023. FINDINGS: Stable  cardiomegaly. Endotracheal and nasogastric tubes are unchanged in position. Right internal jugular catheter is unchanged. Bilateral lung opacities are again noted concerning for pulmonary edema or possibly multifocal pneumonia, right greater than left. Small right pleural effusion is noted. Elevated left hemidiaphragm is noted. Bony thorax is unremarkable. IMPRESSION: Stable support apparatus. Continued bilateral lung opacities are noted concerning for pulmonary edema or possibly multifocal pneumonia, right greater than left. Small right pleural effusion. Electronically Signed   By: Lupita Raider M.D.   On: 01/31/2023 08:50    Medications: I have reviewed the patient's current medications.  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 bili 15) Not a candidate for TIPS because of advanced MELD score and vascular anatomy  Acute alcoholic hepatitis, on prednisolone 40 mg p.o. 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 Remains intubated, sedated, on CRRT Remains critical with multiorgan failure. Continue supportive management. Consider palliative care evaluation.  Kerin Salen, MD 02/01/2023, 1:02 PM

## 2023-02-01 NOTE — Progress Notes (Signed)
NAME:  [name], MRN:  [id number], DOB:  01/29/1986, LOS: 9 ADMISSION DATE:  01/22/2023, CONSULTATION DATE:  4/28 REFERRING MD:  Dr. Eloise Harman , CHIEF COMPLAINT:  Weakness    History of Present Illness:  37 year old man presents to the ED with weakness found to have hemoglobin of 3.   He denies any hematemesis.  No hemoptysis.  No hematochezia.  Denies melena.  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  Last drink 2 days prior to admission.  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  He was hospitalized 10/2022 with GI bleed.  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  Also some portal hypertensive gastropathy.  At home he takes Lasix and spironolactone for lower extremity edema.  He also takes lactulose.   Upon arrival, he was tachycardic to the 170s and SVT.  He was cardioverted.  He remained normotensive.  He remains tachycardic but less so.  His T. bili seems at baseline.  LFTs seem at recent baseline as well.  Last month notable for acute renal failure, severe elevation in creatinine. Denied signs of GI bleeding. Initial PLTs 222, INR 2.5, scr 1.6 (baseline 0.5 range). Got blood (4 units PRBC) admitted to ICU. GI consulted. Placed on Octreotide + PPI infusion. GI recommended CT angio abdomen and pelvis with BRTO. Empiric CTX initiated.  Pertinent  Medical History  Alcoholic Cirrhosis with hx of Varices, Portal Hypertensive Gastropathy   Significant Hospital Events: Including procedures, antibiotic start and stop dates in addition to other pertinent events   4/25 Admit with weakness in setting of Hgb 3. GI consulted. Octreotide + PPI. Empiric CTX.  4/26 Increased WOB, gross hematuria w/clots.  CTA Abd/pelvis showed cirrhotic liver with borderline splenomegaly, large portosystemic venous collaterals, small gastric & esophageal varices, no ascites, cholelithiasis, small bilateral pleural effusions R>L. Hgb 5.3  overnight, 2 more units PRBC and lasix. Urology evaluation, no blood in foley after insertion.  Intubated.  MELDNa 33. IR consulted, not a candidate for TIPS b/c of advanced MELD score and vascular anatomy. No response to lasix. MRSA PCR neg.   4/27 Remains on vent with propofol/fentanyl, octreotide. Worsening AKI. EGD showed small esophageal varices, no evidence of active bleeding, OGT left in place.  4/28 Failed SBT. On fentanyl. Jaundice with worsening bili/INR.  Worsening creatinine, decreasing UOP. BP soft, Hgb <7, 1 unit PRBC given.  4/29 Failed SBT, intermittent vent dyssynchrony 4/30 started on CRRT 5/2 SVT episode overnight, resolved with 1 dose Versed.  Interim History / Subjective:   Remains on CRRT.  Volume removal stopped yesterday due to tachycardia.  Lower extremity duplex without DVT Heart rate improved today   Objective   Blood pressure 132/69, pulse 92, temperature 98.4 F (36.9 C), resp. rate 20, height 5\' 9"  (1.753 m), weight 116.3 kg, SpO2 96 %. CVP:  [19 mmHg-25 mmHg] 23 mmHg  Vent Mode: PCV FiO2 (%):  [30 %-40 %] 40 % Set Rate:  [20 bmp] 20 bmp PEEP:  [8 cmH20] 8 cmH20 Pressure Support:  [15 cmH20] 15 cmH20 Plateau Pressure:  [20 cmH20-26 cmH20] 25 cmH20   Intake/Output Summary (Last 24 hours) at 02/01/2023 0835 Last data filed at 02/01/2023 0800 Gross per 24 hour  Intake 2182.52 ml  Output 2673 ml  Net -490.48 ml   Filed Weights   01/30/23 0500 01/31/23 0500 02/01/23 0400  Weight: 122.2 kg 116.9 kg 116.3 kg    Examination: Gen:  No acute distress, chronically ill-appearing HEENT:  EOMI, sclera anicteric Neck:     No masses; no thyromegaly ET tube Lungs:    Clear to auscultation bilaterally; normal respiratory effort CV:         Regular rate and rhythm; no murmurs Abd:      + bowel sounds; soft, non-tender; no palpable masses, no distension Ext:   1+ edema; adequate peripheral perfusion Skin:      Warm and dry; no rash Neuro: Sedated,  unresponsive  Labs/imaging reviewed Significant for sodium 132, BUN/creatinine 43/2.64 AST 44, ALT 22, total bilirubin 15.0 WBC 6.4, platelets 108 Abdominal x-rays pending Tracheal Aspirate 4/26 > MSSA   Assessment & Plan:   Acute Blood Loss Anemia  In setting of hematuria, GIB. Largest contributor suspected to be GI given hx of cirrhosis, prior EGD findings and portal hypertensive gastropathy with varices. No hematemesis this admit.  Had significant hematuria with clots but no blood in foley on insertion - thought by Urology that he could have had a uretheral lesion that foley created tamponade. Varix on EGD but not banded. Hx recurrent gum bleeding per brother.  -appreciate GI, Urology, IR  -Follow CBC, hemoglobin Transfuse for hemoglobin less than 7 Continue PPI twice daily GI is following  Decompensated Alcoholic Cirrhosis  ETOH Abuse  Nodular contour on RUQ [place], confirmed cirrhosis on CTA ABD.  Was sober for 8 months prior to admit, relapsed 2 days prior to admit and that was last drink. Maddrey's Discriminant Function Score 68 4/27. MELD 33. Worsening T. Bili/INR. Hepatitis panel negative.  -lactulose, xifaxan PT  -continue prednisolone 40mg  QD, started 4/27  -follow INR/t.bili trend  - GI following, appreciate the assistance  Ileus Hold tube feeds.  NG tube to low intermittent suction  Acute Hepatic / Metabolic Encephalopathy  -PAD protocol with fentanyl and precedex, PRN versed  Wean sedation as able Thiamine, folic acid, MVI  Acute Hypoxic Respiratory Failure in setting of Pulmonary Edema, Possible MSSA Aspiration PNA vs Tracheobronchitis s/p course of CTX Continue vent support Deferring pressure support weans until he is more stable.  Will need PEEP to be reduced to 5 Follow intermittent chest x-ray  Tachycardia EKG with sinus tachycardia, troponin levels of low No evidence of clot on lower extremity duplex Continue monitoring.  AKI - improving on CRRT which was  started 4/30 Hyponatremia  UNa 45, more consistent with ATN due to shock/ischemic perfusion, hyperbilirubinemia.  HRS less likely (urine Na should be <10) -appreciate Nephrology  -Continue CRRT.  -Continue midodrine -Trend BMP / urinary output -Replace electrolytes as indicated -Avoid nephrotoxic agents, ensure adequate renal perfusion  Hematuria / Clots  Interestingly, no blood in foley once placed, ? Urethral lesion  -follow UOP -appreciate Urology, if need they can place irrigation foley   LLE Wound  3cm x 2cm x 0.4cm LE lesion  Wound care  Hyperglycemia  Steroid contribution  -glucose well controlled -SSI, moderate scale    Constipation and possible ileus - Miralax PRN, Ducosate daily - Continue Dulcolax, Reglan  Best Practice (right click and "Reselect all SmartList Selections" daily)  Diet/type: NPO DVT prophylaxis: SCD. Defer chemical prophylaxis due to concern for bleed GI prophylaxis: PPI Lines: Dialysis Catheter Foley:  Yes, and it is still needed Code Status:  full code  Last date of multidisciplinary goals of care discussion: Cousin updated at bedside 4/30.  Brother, Amada Jupiter, updated via phone.  Reviewed nature of critical illness, renal lab findings with concerns for ATN.  Discussed utility of  time limited trial of HD but that he is critically ill.  There is some hope of renal recovery if ATN (urine studies consistent with such) but his liver may be the defining issue in terms of his recovery from ICU illness. He indicates understanding and is in support of time limited trial of HD. If no recovery, he understands that we may be facing discussion of transition to comfort measures.   Amada Jupiter indicates his parents to make any decisions regarding his brothers care in terms of end of life (although he recognizes we are not at that point yet).   Barret Palmisano, father, [phone] Jeffray Stoney, [phone]  Critical care time: 35 minutes   The patient is critically  ill with multiple organ system failure and requires high complexity decision making for assessment and support, frequent evaluation and titration of therapies, advanced monitoring, review of radiographic studies and interpretation of complex data.   Critical Care Time devoted to patient care services, exclusive of separately billable procedures, described in this note is 35 minutes.   Chilton Greathouse MD [facility] See Amion for pager  If no response to pager , please call [phone] until 7pm After 7:00 pm call Elink  [phone] 02/01/2023, 8:35 AM

## 2023-02-01 NOTE — Progress Notes (Signed)
[facility] Progress Note  Subjective: pt seen in room. HR's dropped down when UF was kept even yesterday. Started to pull again this am and HR's went from 90s to 105-110 range.   Vitals:   02/01/23 1400 02/01/23 1404 02/01/23 1430 02/01/23 1500  BP: (!) 143/81  135/71 132/69  Pulse: 96  88 89  Resp: 20  20 20   Temp:  98 F (36.7 C)    TempSrc:  Axillary    SpO2: 96%  97% 96%  Weight:      Height:        Exam: General: obese BM-  sedated on the vent Heart: RRR Lungs: dec BS at bases Abdomen: soft, dec'd BS Extremities: diffuse bilat 2-3+ LE edema, less tight today Skin: warm and dry Neuro: sedated    UA 4/26 - prot 30, rbc 21-50 rbc, 6-10 wbc, rare bact  UNa - 45   UCr - 248   Renal US - 12.1/ 11.9 cm kidneys w/o hydro or mass   B/l creatinine = 0.50 from 11/01/22       Assessment/ Plan: AKI - b/l creat 0.50 from 11/01/22. Creat here was 1.6 on admission 4/25 and has risen up to 2.8 and then 4.1 at time of consult. Pt developed shock on 4/28 requiring pressors for 1-2 days and UOP dropped off around the same time. UNa was 45. Suspected ATN or hemodynamic AKI due to shock and/or hyperbilirubinemia. HRS is less likely - it is usually slower to evolve and UNa should be < 10. Pt w/ severe vol overload. Started time-limited trial of CRRT on 4/29. Continue CRRT.  Shock - SP pressor support 4/28- 4/29. Remains off pressors today.  Hyperkalemia - K+ still a little high will change all fluids to 2/2.5.  Vol overload - CRRT started for marked vol overload +/- pulm edema. After 48 hrs of moderately aggressive fluid removal, pt's HR's gradually ^'d up into the 130s, sinus tach. The UF was turned off and HR's dropped slowly back down into the 90s. UF started up again today w/ more modest goals (50- 125 cc/hr net neg), will keep pulling fluid w/ boundaries to keep HR < 115 and BP > 95.   ETOH abuse - last drink 2 days before admission, prior to that had stopped for about 8  mos Decomp alcoholic cirrhosis  AHRF - w/ vol overload +/- aspiration. Repeat CXR 5/3 no change.  MSSA asp PNA vs tracheobronchitis - SP course of IV rocephin.  Anemia - transfuse prn Prognosis/ GOC - palliative care is seeing the patient   Vinson Moselle MD [facility] 02/01/2023, 4:03 PM  Recent Labs  Lab 01/31/23 0258 01/31/23 1531 02/01/23 0249 02/01/23 0334  HGB 8.7*  --  8.9*  --   ALBUMIN 3.2*  3.2* 3.4* 3.5 3.5  CALCIUM 8.6* 8.8* 8.8* 8.8*  PHOS 4.5 3.8 3.3  --   CREATININE 2.60* 2.75* 2.57* 2.64*  K 5.0 4.1 3.8 3.8    No results for input(s): "IRON", "TIBC", "FERRITIN" in the last 168 hours. Inpatient medications:  Chlorhexidine Gluconate Cloth  6 each Topical Q0600   docusate  100 mg Per Tube BID   feeding supplement (PROSource TF20)  60 mL Per Tube TID   folic acid  1 mg Per Tube Daily   insulin aspart  0-15 Units Subcutaneous Q4H   lactulose  20 g Per Tube BID   leptospermum manuka honey  1 Application Topical Daily   midodrine  5 mg Per  Tube TID WC   multivitamin  15 mL Per Tube Daily   mouth rinse  15 mL Mouth Rinse Q2H   pantoprazole (PROTONIX) IV  40 mg Intravenous Q12H   polyethylene glycol  17 g Per Tube Daily   prednisoLONE  40 mg Per Tube Daily   rifaximin  550 mg Per Tube BID   thiamine  100 mg Per Tube Daily   Or   thiamine  100 mg Intravenous Daily    sodium chloride Stopped (01/28/23 1435)   dexmedetomidine (PRECEDEX) IV infusion 1.1 mcg/kg/hr (02/01/23 1500)   feeding supplement (VITAL 1.5 CAL) Stopped (01/31/23 0520)   fentaNYL infusion INTRAVENOUS 200 mcg/hr (02/01/23 1500)   heparin 10,000 units/ 20 mL infusion syringe 500 Units/hr (02/01/23 0821)   prismasol BGK 2/2.5 replacement solution 400 mL/hr at 02/01/23 1327   prismasol BGK 2/2.5 replacement solution 400 mL/hr at 02/01/23 1327   prismasol BGK 4/2.5 1,500 mL/hr at 02/01/23 1506   bisacodyl, fentaNYL, heparin, ipratropium-albuterol, midazolam, ondansetron (ZOFRAN) IV, mouth rinse,  polyethylene glycol

## 2023-02-01 NOTE — Progress Notes (Signed)
Daily Progress Note   Patient Name: [name]       Date: 02/01/2023 DOB: Feb 15, 1986  Age: 37 y.o. MRN#: [id number] Attending Physician: Chilton Greathouse, MD Primary Care Physician: Malka So., MD Admit Date: 01/02/2023  Reason for Consultation/Follow-up: Establishing goals of care  Subjective: Remains with ventilatory support, undergoing time-limited trial of CVVHD in hopes for renal recovery.  Both parents and several other family members  present at bedside.  Chart reviewed.  Discussed with bedside RN, family meeting with parents and other people present in the room to discuss about the patient's current condition and CODE STATUS and broad goals of care discussions took place, see below.   Length of Stay: 9  Current Medications: Scheduled Meds:   Chlorhexidine Gluconate Cloth  6 each Topical Q0600   docusate  100 mg Per Tube BID   feeding supplement (PROSource TF20)  60 mL Per Tube TID   folic acid  1 mg Per Tube Daily   insulin aspart  0-15 Units Subcutaneous Q4H   lactulose  20 g Per Tube BID   leptospermum manuka honey  1 Application Topical Daily   midodrine  5 mg Per Tube TID WC   multivitamin  15 mL Per Tube Daily   mouth rinse  15 mL Mouth Rinse Q2H   pantoprazole (PROTONIX) IV  40 mg Intravenous Q12H   polyethylene glycol  17 g Per Tube Daily   prednisoLONE  40 mg Per Tube Daily   rifaximin  550 mg Per Tube BID   thiamine  100 mg Per Tube Daily   Or   thiamine  100 mg Intravenous Daily    Continuous Infusions:  sodium chloride Stopped (01/28/23 1435)   dexmedetomidine (PRECEDEX) IV infusion 1.1 mcg/kg/hr (02/01/23 1325)   feeding supplement (VITAL 1.5 CAL) Stopped (01/31/23 0520)   fentaNYL infusion INTRAVENOUS 200 mcg/hr (02/01/23 1300)   heparin 10,000  units/ 20 mL infusion syringe 500 Units/hr (02/01/23 0821)   prismasol BGK 2/2.5 replacement solution 400 mL/hr at 02/01/23 1327   prismasol BGK 2/2.5 replacement solution 400 mL/hr at 02/01/23 1327   prismasol BGK 4/2.5 1,500 mL/hr at 02/01/23 1141    PRN Meds: bisacodyl, fentaNYL, heparin, ipratropium-albuterol, midazolam, ondansetron (ZOFRAN) IV, mouth rinse, polyethylene glycol  Physical Exam         Young  patient appears ill resting in bed Monitor noted Has ET tube Is on ventilatory support Abdomen is distended He has generalized edema  Vital Signs: BP 128/71   Pulse (!) 109   Temp 98.5 F (36.9 C) (Oral)   Resp 20   Ht 5\' 9"  (1.753 m)   Wt 116.3 kg   SpO2 95%   BMI 37.86 kg/m  SpO2: SpO2: 95 % O2 Device: O2 Device: Ventilator O2 Flow Rate: O2 Flow Rate (L/min): 4 L/min  Intake/output summary:  Intake/Output Summary (Last 24 hours) at 02/01/2023 1332 Last data filed at 02/01/2023 1300 Gross per 24 hour  Intake 2030.29 ml  Output 3155 ml  Net -1124.71 ml    LBM: Last BM Date : 02/01/23 Baseline Weight: Weight: 99.8 kg Most recent weight: Weight: 116.3 kg       Palliative Assessment/Data:      Patient Active Problem List   Diagnosis Date Noted   Decompensated hepatic cirrhosis (HCC) 01/28/2023   Acute respiratory failure with hypoxia (HCC) 01/24/2023   Gross hematuria 01/24/2023   Anemia 01/19/2023   Alcohol withdrawal (HCC) 10/27/2022   Alcohol intoxication in active alcoholic without complication (HCC) 10/25/2022   Elevated AFP 08/20/2022   Hyperammonemia (HCC) 07/31/2022   Class 1 obesity 07/28/2022   Hypokalemia 07/28/2022   Hypomagnesemia 07/28/2022   Symptomatic anemia 07/27/2022   Thrombocytopenia (HCC) 07/27/2022   GERD (gastroesophageal reflux disease) 07/26/2022   Coagulopathy (HCC) 07/26/2022   Hematoma 07/26/2022   Hyperbilirubinemia 07/26/2022   Hypoalbuminemia 01/29/2022   Iron deficiency anemia 01/12/2022   Apnea 01/11/2022    Murmur 01/11/2022   Alcoholic cirrhosis of liver without ascites (HCC) 05/21/2021   GI bleeding 04/25/2021   Essential hypertension 04/25/2021   Acute blood loss anemia 04/25/2021   Hyperglobulinemia 12/23/2019   Prediabetes 04/29/2019   Proteinuria 04/29/2019    Palliative Care Assessment & Plan   Patient Profile:    Assessment: Acute GI blood loss with associated hemorrhagic shock in the setting of esophageal varices, MSSA tracheobronchitis versus pneumonia with associated sepsis, ventilator dependent acute respiratory failure Serious life limiting illness of decompensated alcoholic cirrhosis with history of alcohol use. Acute renal failure suspected to be from ATN, undergoing time-limited trial of CVVHD. Palliative care following for additional support.  Recommendations/Plan: Chart reviewed, patient seen, discussed with nursing colleague. Presented to bedside with the patient's parents and several family members present.  Discussed about how this hospitalization is going.  Patient remains with ventilator dependent respiratory failure, decompensated cirrhosis likely stemming from EtOH use.  CRRT trial also underway. Shared with family frankly but compassionately that the patient does not seem to display any meaningful signs of stabilization/recovery.  Reviewed gently with them about patient's extensive serious illnesses-renal failure, respiratory failure, decompensated cirrhosis, adynamic colon and small bowel ileus.  CODE STATUS discussions undertaken.  Goals wishes and values attempted to be explored.  Explained about differences between full code versus DNR.  Discussed that the patient remains on ventilatory support and is also undergoing a trial of dialysis.  If in spite of these aggressive measures and interventions his heart were to stop, we put forth the recommendation for not doing CPR and for allowing for DNR.  Patient's mother immediately becomes tearful.  Additionally,  discussed about what the patient's care plan would look like if the patient has ongoing decline and no meaningful recovery-briefly gave an overview about one-way compassionate extubation, discontinuation of dialysis after the time trial is completed, also gave them some information about what full  scope of comfort measures will look like.  Explained to them to the best of my ability how seriously ill the patient is.  Several of patient's family members moved towards the patient's bed and began engaging in prayer and began encouraging the patient to heal and recover.  Family states that they have a lot of faith and that there are a lot of people praying for the patient to recover.  Offered active listening, supportive empathic presence and other therapeutic techniques were also employed.  Have shared with them the medical recommendation for establishing DO NOT RESUSCITATE and for beginning to start thinking about a possible transition to comfort measures.  PMT will continue to follow-up.    Goals of Care and Additional Recommendations: Limitations on Scope of Treatment: Full Scope Treatment  Code Status:    Code Status Orders  (From admission, onward)           Start     Ordered   2023/02/02 1016  Full code  Continuous       Question:  By:  Answer:  Consent: discussion documented in EHR   2023-02-02 1017           Code Status History     Date Active Date Inactive Code Status Order ID Comments User Context   10/25/2022 1538 11/02/2022 1821 Full Code [id number]  Bobette Mo, MD ED   07/26/2022 1627 07/28/2022 2006 Full Code [id number]  Teddy Spike, DO ED   01/12/2022 1357 01/16/2022 1836 Full Code [id number]  Quincy Simmonds, MD ED   04/25/2021 1446 04/28/2021 1850 Full Code [id number]  Arrien, York Ram, MD Inpatient       Prognosis:  Guarded   Discharge Planning: To Be Determined  Care plan was discussed with  family present in the room.   Thank you for allowing the  Palliative Medicine Team to assist in the care of this patient.  High MDM     Greater than 50%  of this time was spent counseling and coordinating care related to the above assessment and plan.  Rosalin Hawking, MD  Please contact Palliative Medicine Team phone at [phone] for questions and concerns.

## 2023-02-02 ENCOUNTER — Inpatient Hospital Stay (HOSPITAL_COMMUNITY): Payer: Medicaid Other

## 2023-02-02 DIAGNOSIS — Z515 Encounter for palliative care: Secondary | ICD-10-CM | POA: Diagnosis not present

## 2023-02-02 DIAGNOSIS — R531 Weakness: Secondary | ICD-10-CM | POA: Diagnosis not present

## 2023-02-02 DIAGNOSIS — J9601 Acute respiratory failure with hypoxia: Secondary | ICD-10-CM | POA: Diagnosis not present

## 2023-02-02 DIAGNOSIS — Z7189 Other specified counseling: Secondary | ICD-10-CM | POA: Diagnosis not present

## 2023-02-02 LAB — COMPREHENSIVE METABOLIC PANEL
ALT: 24 U/L (ref 0–44)
AST: 43 U/L — ABNORMAL HIGH (ref 15–41)
Albumin: 2.9 g/dL — ABNORMAL LOW (ref 3.5–5.0)
Alkaline Phosphatase: 49 U/L (ref 38–126)
Anion gap: 9 (ref 5–15)
BUN: 39 mg/dL — ABNORMAL HIGH (ref 6–20)
CO2: 23 mmol/L (ref 22–32)
Calcium: 8.5 mg/dL — ABNORMAL LOW (ref 8.9–10.3)
Chloride: 101 mmol/L (ref 98–111)
Creatinine, Ser: 2.37 mg/dL — ABNORMAL HIGH (ref 0.61–1.24)
GFR, Estimated: 35 mL/min — ABNORMAL LOW (ref >60)
Glucose, Bld: 112 mg/dL — ABNORMAL HIGH (ref 70–99)
Potassium: 3.7 mmol/L (ref 3.5–5.1)
Sodium: 133 mmol/L — ABNORMAL LOW (ref 135–145)
Total Bilirubin: 15.9 mg/dL — ABNORMAL HIGH (ref 0.3–1.2)
Total Protein: 7.9 g/dL (ref 6.5–8.1)

## 2023-02-02 LAB — GLUCOSE, CAPILLARY
Glucose-Capillary: 105 mg/dL — ABNORMAL HIGH (ref 70–99)
Glucose-Capillary: 105 mg/dL — ABNORMAL HIGH (ref 70–99)
Glucose-Capillary: 119 mg/dL — ABNORMAL HIGH (ref 70–99)
Glucose-Capillary: 115 mg/dL — ABNORMAL HIGH (ref 70–99)
Glucose-Capillary: 94 mg/dL (ref 70–99)

## 2023-02-02 LAB — RENAL FUNCTION PANEL
Albumin: 2.9 g/dL — ABNORMAL LOW (ref 3.5–5.0)
Albumin: 3.3 g/dL — ABNORMAL LOW (ref 3.5–5.0)
Anion gap: 10 (ref 5–15)
Anion gap: 11 (ref 5–15)
BUN: 40 mg/dL — ABNORMAL HIGH (ref 6–20)
BUN: 39 mg/dL — ABNORMAL HIGH (ref 6–20)
CO2: 23 mmol/L (ref 22–32)
CO2: 22 mmol/L (ref 22–32)
Calcium: 8.6 mg/dL — ABNORMAL LOW (ref 8.9–10.3)
Calcium: 8.8 mg/dL — ABNORMAL LOW (ref 8.9–10.3)
Chloride: 100 mmol/L (ref 98–111)
Chloride: 102 mmol/L (ref 98–111)
Creatinine, Ser: 2.3 mg/dL — ABNORMAL HIGH (ref 0.61–1.24)
Creatinine, Ser: 1.97 mg/dL — ABNORMAL HIGH (ref 0.61–1.24)
GFR, Estimated: 36 mL/min — ABNORMAL LOW (ref >60)
GFR, Estimated: 44 mL/min — ABNORMAL LOW (ref >60)
Glucose, Bld: 114 mg/dL — ABNORMAL HIGH (ref 70–99)
Glucose, Bld: 118 mg/dL — ABNORMAL HIGH (ref 70–99)
Phosphorus: 2.9 mg/dL (ref 2.5–4.6)
Phosphorus: 2.3 mg/dL — ABNORMAL LOW (ref 2.5–4.6)
Potassium: 3.7 mmol/L (ref 3.5–5.1)
Potassium: 3.4 mmol/L — ABNORMAL LOW (ref 3.5–5.1)
Sodium: 133 mmol/L — ABNORMAL LOW (ref 135–145)
Sodium: 135 mmol/L (ref 135–145)

## 2023-02-02 LAB — CBC
HCT: 30 % — ABNORMAL LOW (ref 39.0–52.0)
Hemoglobin: 9.4 g/dL — ABNORMAL LOW (ref 13.0–17.0)
MCH: 26.6 pg (ref 26.0–34.0)
MCHC: 31.3 g/dL (ref 30.0–36.0)
MCV: 85 fL (ref 80.0–100.0)
Platelets: 86 10*3/uL — ABNORMAL LOW (ref 150–400)
RBC: 3.53 MIL/uL — ABNORMAL LOW (ref 4.22–5.81)
RDW: 20.3 % — ABNORMAL HIGH (ref 11.5–15.5)
WBC: 6 10*3/uL (ref 4.0–10.5)
nRBC: 1.2 % — ABNORMAL HIGH (ref 0.0–0.2)

## 2023-02-02 LAB — PROTIME-INR
INR: 2.7 — ABNORMAL HIGH (ref 0.8–1.2)
Prothrombin Time: 28 seconds — ABNORMAL HIGH (ref 11.4–15.2)

## 2023-02-02 LAB — CULTURE, RESPIRATORY W GRAM STAIN

## 2023-02-02 LAB — MAGNESIUM: Magnesium: 3 mg/dL — ABNORMAL HIGH (ref 1.7–2.4)

## 2023-02-02 LAB — APTT: aPTT: 57 seconds — ABNORMAL HIGH (ref 24–36)

## 2023-02-02 MED ORDER — NOREPINEPHRINE 4 MG/250ML-% IV SOLN
0.0000 ug/min | INTRAVENOUS | Status: DC
  Administered 2023-02-02: 2 ug/min via INTRAVENOUS
  Filled 2023-02-02: qty 250

## 2023-02-02 MED ORDER — ALBUMIN HUMAN 25 % IV SOLN
50.0000 g | Freq: Two times a day (BID) | INTRAVENOUS | Status: AC
  Administered 2023-02-02 – 2023-02-03 (×4): 50 g via INTRAVENOUS
  Filled 2023-02-02 (×4): qty 200

## 2023-02-02 MED ORDER — PHENYLEPHRINE HCL-NACL 20-0.9 MG/250ML-% IV SOLN
INTRAVENOUS | Status: AC
  Filled 2023-02-02: qty 250

## 2023-02-02 MED ORDER — PRISMASOL BGK 4/2.5 32-4-2.5 MEQ/L REPLACEMENT SOLN: Status: DC

## 2023-02-02 MED ORDER — POTASSIUM CHLORIDE 10 MEQ/50ML IV SOLN
10.0000 meq | INTRAVENOUS | Status: AC
  Administered 2023-02-02 (×3): 10 meq via INTRAVENOUS
  Filled 2023-02-02 (×3): qty 50

## 2023-02-02 MED ORDER — PHENYLEPHRINE HCL-NACL 20-0.9 MG/250ML-% IV SOLN
0.0000 ug/min | INTRAVENOUS | Status: DC
  Administered 2023-02-02: 20 ug/min via INTRAVENOUS

## 2023-02-02 NOTE — Progress Notes (Signed)
Daily Progress Note   Patient Name: [name]       Date: 02/02/2023 DOB: 1986-06-17  Age: 37 y.o. MRN#: [id number] Attending Physician: Chilton Greathouse, MD Primary Care Physician: Malka So., MD Admit Date: 01/26/2023  Reason for Consultation/Follow-up: Establishing goals of care  Subjective: Patient seen and examined, saw patient earlier in am, discussed with Dr. Isaiah Serge, call placed and discussed with brother Amada Jupiter, I met with patient's significant other Queenie at bedside, I called and spoke with patient's mother Ms Meriam Sprague who stated that they are on their way to the hospital from [facility], [place]. I arrived at bedside again this afternoon, to re discuss goals of care, specifically code status discussions were held, see below.   Length of Stay: 10  Current Medications: Scheduled Meds:   Chlorhexidine Gluconate Cloth  6 each Topical Q0600   docusate  100 mg Per Tube BID   feeding supplement (PROSource TF20)  60 mL Per Tube TID   folic acid  1 mg Per Tube Daily   insulin aspart  0-15 Units Subcutaneous Q4H   lactulose  20 g Per Tube BID   leptospermum manuka honey  1 Application Topical Daily   midodrine  5 mg Per Tube TID WC   multivitamin  15 mL Per Tube Daily   mouth rinse  15 mL Mouth Rinse Q2H   pantoprazole (PROTONIX) IV  40 mg Intravenous Q12H   polyethylene glycol  17 g Per Tube Daily   rifaximin  550 mg Per Tube BID   thiamine  100 mg Per Tube Daily   Or   thiamine  100 mg Intravenous Daily    Continuous Infusions:  sodium chloride Stopped (01/28/23 1435)   albumin human 60 mL/hr at 02/02/23 1600   dexmedetomidine (PRECEDEX) IV infusion 1 mcg/kg/hr (02/02/23 1600)   feeding supplement (VITAL 1.5 CAL) Stopped (01/31/23 0520)   fentaNYL infusion INTRAVENOUS 175  mcg/hr (02/02/23 1600)   heparin 10,000 units/ 20 mL infusion syringe 500 Units/hr (02/02/23 0304)   phenylephrine (NEO-SYNEPHRINE) Adult infusion Stopped (02/02/23 1554)   prismasol BGK 2/2.5 replacement solution 400 mL/hr at 02/02/23 1445   prismasol BGK 2/2.5 replacement solution 400 mL/hr at 02/02/23 1447   prismasol BGK 4/2.5 1,500 mL/hr at 02/02/23 1425    PRN Meds: bisacodyl, fentaNYL, heparin, ipratropium-albuterol, midazolam, ondansetron (  ZOFRAN) IV, mouth rinse, polyethylene glycol  Physical Exam         On vent Monitor noted Appears chronically ill Has edema  Vital Signs: BP 118/64   Pulse 91   Temp 98.1 F (36.7 C) (Oral)   Resp 20   Ht 5\' 9"  (1.753 m)   Wt 112.2 kg   SpO2 100%   BMI 36.53 kg/m  SpO2: SpO2: 100 % O2 Device: O2 Device: Ventilator O2 Flow Rate: O2 Flow Rate (L/min): 4 L/min  Intake/output summary:  Intake/Output Summary (Last 24 hours) at 02/02/2023 1638 Last data filed at 02/02/2023 1600 Gross per 24 hour  Intake 1829.01 ml  Output 3283 ml  Net -1453.99 ml   LBM: Last BM Date : 02/01/23 Baseline Weight: Weight: 99.8 kg Most recent weight: Weight: 112.2 kg       Palliative Assessment/Data:      Patient Active Problem List   Diagnosis Date Noted   Decompensated hepatic cirrhosis (HCC) 01/28/2023   Acute respiratory failure with hypoxia (HCC) 01/24/2023   Gross hematuria 01/24/2023   Anemia 01/02/2023   Alcohol withdrawal (HCC) 10/27/2022   Alcohol intoxication in active alcoholic without complication (HCC) 10/25/2022   Elevated AFP 08/20/2022   Hyperammonemia (HCC) 07/31/2022   Class 1 obesity 07/28/2022   Hypokalemia 07/28/2022   Hypomagnesemia 07/28/2022   Symptomatic anemia 07/27/2022   Thrombocytopenia (HCC) 07/27/2022   GERD (gastroesophageal reflux disease) 07/26/2022   Coagulopathy (HCC) 07/26/2022   Hematoma 07/26/2022   Hyperbilirubinemia 07/26/2022   Hypoalbuminemia 01/29/2022   Iron deficiency anemia 01/12/2022    Apnea 01/11/2022   Murmur 01/11/2022   Alcoholic cirrhosis of liver without ascites (HCC) 05/21/2021   GI bleeding 04/25/2021   Essential hypertension 04/25/2021   Acute blood loss anemia 04/25/2021   Hyperglobulinemia 12/23/2019   Prediabetes 04/29/2019   Proteinuria 04/29/2019    Palliative Care Assessment & Plan   Patient Profile:    Assessment: Ventilator dependent respiratory failure Acute kidney injury Decompensated EtOH cirrhosis Shock and hyperkalemia MSSA aspiration pneumonia versus tracheobronchitis Palliative care following for goals of care discussions. Acute alcoholic hepatitis All esophageal varices gastric varices splenomegaly  Recommendations/Plan: Status and goals of discussions undertaken.  I met with the significant other at bedside.  A family meeting was held this evening out to his side the ICU in the waiting room with the patient's mother, father, significant other, aunt, several cousins and several other family members in attendance.  We discussed about patient's ongoing serious irreversible illness and the fact that the patient remains at high risk for ongoing decline and decompensation and even death in spite of current aggressive interventions.  Updated them on the hemodynamic instabilities of the day-hypotension necessitating vasopressors and ongoing low blood pressures for most of the day today.  Hence, described to them about DO NOT RESUSCITATE.  Described about withdrawal of mechanical means and establishment of comfort as a singular goal.  All of their questions and concerns addressed to the best of my ability.  Updated PCCM MD colleague Dr. Isaiah Serge as well. Plan: DNR Continue mechanical ventilatory support, also undergoing dialysis trial. Family very strongly considering withdrawal of mechanical support and allowing for natural drying process and allowing for comfort care.  They would like to discuss further with other extended family members  tonight. PMT will follow-up in a.m. on 02-03-2023.    Code Status: now DNR, remains on vent and CRRT, family considering one-way extubation.     Code Status Orders  (From admission, onward)  Start     Ordered   01/19/2023 1016  Full code  Continuous       Question:  By:  Answer:  Consent: discussion documented in EHR   01/03/2023 1017           Code Status History     Date Active Date Inactive Code Status Order ID Comments User Context   10/25/2022 1538 11/02/2022 1821 Full Code [id number]  Bobette Mo, MD ED   07/26/2022 1627 07/28/2022 2006 Full Code [id number]  Teddy Spike, DO ED   01/12/2022 1357 01/16/2022 1836 Full Code [id number]  Quincy Simmonds, MD ED   04/25/2021 1446 04/28/2021 1850 Full Code [id number]  Arrien, York Ram, MD Inpatient       Prognosis:  Hours - Days  Discharge Planning: Anticipated Hospital Death  Care plan was discussed with  mother father, significant other, brother Gaspar Bidding, cousins and several other family members.   Thank you for allowing the Palliative Medicine Team to assist in the care of this patient.  High MDM.      Greater than 50%  of this time was spent counseling and coordinating care related to the above assessment and plan.  Rosalin Hawking, MD  Please contact Palliative Medicine Team phone at [phone] for questions and concerns.

## 2023-02-02 NOTE — Progress Notes (Signed)
SBP consistently staying 88-96. Mannam MD aware, Patient fluid removal decreased to 0 due to BP

## 2023-02-02 NOTE — Progress Notes (Addendum)
Heart rate at beginning of shift noted to be NSR 92-98 bpm, BP 130-140s systolic overnight. Rate dropped and began sustaining 70-76 bpm, BP cycled- 102/65, repeat 96/48. EKG completed-Atrial flutter w variable AV block. Hard copy of EKG placed in chart.   Stopped pulling on CRRT as BP dropped.   Mannam MD aware.

## 2023-02-02 NOTE — Progress Notes (Signed)
NAME:  [name], MRN:  [id number], DOB:  [birth year], LOS: 10 ADMISSION DATE:  01/06/2023, CONSULTATION DATE:  4/28 REFERRING MD:  Dr. Eloise Harman , CHIEF COMPLAINT:  Weakness    History of Present Illness:  37 year old man presents to the ED with weakness found to have hemoglobin of 3.   He denies any hematemesis.  No hemoptysis.  No hematochezia.  Denies melena.  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  Last drink 2 days prior to admission.  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  He was hospitalized 10/2022 with GI bleed.  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  Also some portal hypertensive gastropathy.  At home he takes Lasix and spironolactone for lower extremity edema.  He also takes lactulose.   Upon arrival, he was tachycardic to the 170s and SVT.  He was cardioverted.  He remained normotensive.  He remains tachycardic but less so.  His T. bili seems at baseline.  LFTs seem at recent baseline as well.  Last month notable for acute renal failure, severe elevation in creatinine. Denied signs of GI bleeding. Initial PLTs 222, INR 2.5, scr 1.6 (baseline 0.5 range). Got blood (4 units PRBC) admitted to ICU. GI consulted. Placed on Octreotide + PPI infusion. GI recommended CT angio abdomen and pelvis with BRTO. Empiric CTX initiated.  Pertinent  Medical History  Alcoholic Cirrhosis with hx of Varices, Portal Hypertensive Gastropathy   Significant Hospital Events: Including procedures, antibiotic start and stop dates in addition to other pertinent events   4/25 Admit with weakness in setting of Hgb 3. GI consulted. Octreotide + PPI. Empiric CTX.  4/26 Increased WOB, gross hematuria w/clots.  CTA Abd/pelvis showed cirrhotic liver with borderline splenomegaly, large portosystemic venous collaterals, small gastric & esophageal varices, no ascites, cholelithiasis, small bilateral pleural effusions R>L. Hgb 5.3  overnight, 2 more units PRBC and lasix. Urology evaluation, no blood in foley after insertion.  Intubated.  MELDNa 33. IR consulted, not a candidate for TIPS b/c of advanced MELD score and vascular anatomy. No response to lasix. MRSA PCR neg.   Jan 29, 2023 Remains on vent with propofol/fentanyl, octreotide. Worsening AKI. EGD showed small esophageal varices, no evidence of active bleeding, OGT left in place.  4/28 Failed SBT. On fentanyl. Jaundice with worsening bili/INR.  Worsening creatinine, decreasing UOP. BP soft, Hgb <7, 1 unit PRBC given.  4/29 Failed SBT, intermittent vent dyssynchrony 4/30 started on CRRT 5/2 SVT episode overnight, resolved with 1 dose Versed. 5/3 Lower extremity duplex without DVT  Interim History / Subjective:   Remains on CRRT.  Volume removal stopped today AM due to new onset a fib.    Objective   Blood pressure (!) 96/48, pulse 91, temperature 97.7 F (36.5 C), resp. rate 20, height 5\' 9"  (1.753 m), weight 112.2 kg, SpO2 99 %. CVP:  [17 mmHg-19 mmHg] 18 mmHg  Vent Mode: PCV FiO2 (%):  [40 %] 40 % Set Rate:  [20 bmp] 20 bmp PEEP:  [8 cmH20] 8 cmH20 Plateau Pressure:  [20 cmH20-25 cmH20] 25 cmH20   Intake/Output Summary (Last 24 hours) at 02/02/2023 0929 Last data filed at 02/02/2023 0900 Gross per 24 hour  Intake 1921.27 ml  Output 4722 ml  Net -2800.73 ml   Filed Weights   01/31/23 0500 02/01/23 0400 02/02/23 0300  Weight: 116.9 kg 116.3 kg 112.2 kg    Examination: Blood pressure (!) 110/55, pulse 90, temperature 97.7 F (36.5  C), resp. rate 20, height 5\' 9"  (1.753 m), weight 112.2 kg, SpO2 98 %. Gen:      No acute distress HEENT:  EOMI, sclera anicteric, ETT Neck:     No masses; no thyromegaly Lungs:    Clear to auscultation bilaterally; normal respiratory effort CV:         Regular rate and rhythm; no murmurs Abd:      + bowel sounds; soft, non-tender; no palpable masses, no distension Ext:   1+ edema; adequate peripheral perfusion Skin:      Warm and  dry; no rash Neuro: Comatose, unresponsive  Labs/imaging reviewed Significant for sodium 133, BUN/creatinine 39/2.37 Total bilirubin 15.9, platelets 86, INR 2.7 Tracheal Aspirate 4/26 > MSSA   Assessment & Plan:   Acute Blood Loss Anemia  In setting of hematuria, GIB. Largest contributor suspected to be GI given hx of cirrhosis, prior EGD findings and portal hypertensive gastropathy with varices. No hematemesis this admit.  Had significant hematuria with clots but no blood in foley on insertion - thought by Urology that he could have had a uretheral lesion that foley created tamponade. Varix on EGD but not banded. Hx recurrent gum bleeding per brother.  Appreciate GI, Urology, IR  Follow CBC, hemoglobin Transfuse for hemoglobin less than 7 Continue PPI twice daily GI is following  Decompensated Alcoholic Cirrhosis, acute ETOH hepatitis ETOH Abuse  Nodular contour on RUQ [place], confirmed cirrhosis on CTA ABD.  Was sober for 8 months prior to admit, relapsed 2 days prior to admit and that was last drink. Maddrey's Discriminant Function Score 68 4/27. Worsening T. Bili/INR. Hepatitis panel negative.  On prednisolone 40mg  QD, started 4/27. Discussed with GI. Will stop steroids as there is not improvement in INR and bilirubin after 7 days of therapy and MELD is worse.  MELD 3.0: 38 at 02/02/2023  2:42 AM MELD-Na: 37 at 02/02/2023  2:42 AM Calculated from: Serum Creatinine: 2.30 mg/dL at 10/05/1094  0:45 AM Serum Sodium: 133 mmol/L at 02/02/2023  2:42 AM Total Bilirubin: 15.9 mg/dL at 4/0/9811  9:14 AM Serum Albumin: 2.9 g/dL at 04/07/2955  2:13 AM INR(ratio): 2.7 at 02/02/2023  2:42 AM Age at listing (hypothetical): 37 years Sex: Male at 02/02/2023  2:42 AM   Ileus Hold tube feeds.  NG tube to low intermittent suction  Acute Hepatic / Metabolic, Encephalopathy  Lactulose, xifaxan PT  PAD protocol with fentanyl and precedex, PRN versed  Wean sedation as able Thiamine, folic acid, MVI  Acute  Hypoxic Respiratory Failure in setting of Pulmonary Edema, Possible MSSA Aspiration PNA vs Tracheobronchitis s/p course of CTX Continue vent support Deferring pressure support weans until he is more stable.   Follow intermittent chest x-ray  Tachycardia, paroxysmal atrial for flutter Suspicion for PE is low. No evidence of clot on lower extremity duplex Start amiodarone if needed Continue monitoring.  AKI - improving on CRRT which was started 4/30 Hyponatremia  UNa 45, more consistent with ATN due to shock/ischemic perfusion, hyperbilirubinemia.  HRS less likely (urine Na should be <10) -appreciate Nephrology  -Continue CRRT.  -Continue midodrine -Trend BMP / urinary output -Replace electrolytes as indicated -Avoid nephrotoxic agents, ensure adequate renal perfusion  Hematuria / Clots  Interestingly, no blood in foley once placed, ? Urethral lesion  -follow UOP -appreciate Urology, if need they can place irrigation foley   LLE Wound  3cm x 2cm x 0.4cm LE lesion  Wound care  Hyperglycemia  -SSI, moderate scale    Constipation and possible  ileus - Miralax PRN, Ducosate daily - Continue Dulcolax, Reglan  Best Practice (right click and "Reselect all SmartList Selections" daily)  Diet/type: NPO DVT prophylaxis: SCD. Defer chemical prophylaxis due to concern for bleed GI prophylaxis: PPI Lines: Dialysis Catheter Foley:  Yes, and it is still needed Code Status:  full code  Last date of multidisciplinary goals of care discussion: Brother, Amada Jupiter, updated at bedside 5/5.  Reviewed his clinical course over the past few days utility of time limited trial of HD.  He is still not making urine and this is day 5 of renal replacement.  His liver function is worse with worsening MELD score.  Recommend to the point where we had to make a decision and I have recommended consideration of comfort measures.  Amada Jupiter indicates his parents to make any decisions regarding his brothers care in terms of  end of life and will be at bedside tomorrow. Palliative care has also been discussing with family.  Critical care time: 35 minutes   The patient is critically ill with multiple organ system failure and requires high complexity decision making for assessment and support, frequent evaluation and titration of therapies, advanced monitoring, review of radiographic studies and interpretation of complex data.   Critical Care Time devoted to patient care services, exclusive of separately billable procedures, described in this note is 35 minutes.   Chilton Greathouse MD [facility] See Amion for pager  If no response to pager , please call [phone] until 7pm After 7:00 pm call Elink  [phone] 02/02/2023, 9:29 AM

## 2023-02-02 NOTE — Progress Notes (Signed)
PCCM note  Hypotensive today afternoon Started on Levophed but could not tolerate due to tachycardia Switch pressors to Neo-Synephrine Not clear if he is developing an infection as he is afebrile with normal WBC count today AM. Low threshold to start antibiotics if he spikes a fever.  Chilton Greathouse MD  Pulmonary & Critical care See Amion for pager  If no response to pager , please call [phone] until 7pm After 7:00 pm call Elink  [phone] 02/02/2023, 3:05 PM

## 2023-02-02 NOTE — Progress Notes (Signed)
[facility] Progress Note  Subjective: pt seen in ICU. No pressors. BP's have dropped the last few hours. Did get off net neg 1.9 L yesterday. However, BP's are dropping today and HR's are okay. CVP is down from 25 two days ago to 18 today.   Vitals:   02/02/23 1045 02/02/23 1100 02/02/23 1115 02/02/23 1132  BP: (!) 88/46 (!) 106/47 (!) 90/47   Pulse: 69 (!) 43 67   Resp: 20 20 20    Temp:      TempSrc:      SpO2: 100% 99% 98% 100%  Weight:      Height:        Exam: General: obese BM-  sedated on the vent Heart: RRR Lungs: dec BS at bases Abdomen: soft, dec'd BS Extremities: diffuse bilat 2-3+ LE edema, less tight  Skin: warm and dry Neuro: sedated    UA 4/26 - prot 30, rbc 21-50 rbc, 6-10 wbc, rare bact  UNa - 45   UCr - 248   Renal US - 12.1/ 11.9 cm kidneys w/o hydro or mass   B/l creatinine = 0.50 from 11/01/22       Assessment/ Plan: AKI - b/l creat 0.50 from 11/01/22. Creat here was 1.6 on admission 4/25 and has risen up to 2.8 and then 4.1 at time of consult. Pt developed shock on 4/28 requiring pressors for 1-2 days and UOP dropped off around the same time. UNa was 45. Suspected ATN or hemodynamic AKI due to shock and/or hyperbilirubinemia. HRS is less likely - it is usually slower to evolve and UNa should be < 10. Pt w/ severe vol overload still, CVP down but still high at 18. Wt's are down but LE edema is still significant. Started time-limited trial of CRRT on 4/29. Continue CRRT.  Shock - SP pressor support 4/28- 4/29. Remains off pressors today.  Potassium - was high , now low.  Will give 3 run IV kcl and change all fluids to 4/2.5.   Vol overload - CRRT started for marked vol overload +/- pulm edema. After 48 hrs of moderately aggressive fluid removal, pt's HR's gradually ^'d up into the 130s, sinus tach. The UF was turned off and HR's dropped slowly back down into the 90s. UF started up again 5/4 w/ more modest goals (50- 125 cc/hr net neg). Continue.   ETOH abuse - last drink 2 days before admission, prior to that had stopped for about 8 mos Decomp alcoholic cirrhosis  AHRF - w/ vol overload +/- aspiration. Repeat CXR 5/3 no change.  MSSA asp PNA vs tracheobronchitis - SP course of IV rocephin.  Anemia - transfuse prn Prognosis/ GOC - palliative care is seeing the patient   Vinson Moselle MD [facility] 02/02/2023, 12:13 PM  Recent Labs  Lab 02/01/23 0249 02/01/23 0334 02/01/23 1614 02/02/23 0242  HGB 8.9*  --   --  9.4*  ALBUMIN 3.5   < > 3.1* 2.9*  2.9*  CALCIUM 8.8*   < > 8.6* 8.5*  8.6*  PHOS 3.3  --  3.9 2.9  CREATININE 2.57*   < > 2.35* 2.37*  2.30*  K 3.8   < > 3.9 3.7  3.7   < > = values in this interval not displayed.    No results for input(s): "IRON", "TIBC", "FERRITIN" in the last 168 hours. Inpatient medications:  Chlorhexidine Gluconate Cloth  6 each Topical Q0600   docusate  100 mg Per Tube BID   feeding supplement (  PROSource TF20)  60 mL Per Tube TID   folic acid  1 mg Per Tube Daily   insulin aspart  0-15 Units Subcutaneous Q4H   lactulose  20 g Per Tube BID   leptospermum manuka honey  1 Application Topical Daily   midodrine  5 mg Per Tube TID WC   multivitamin  15 mL Per Tube Daily   mouth rinse  15 mL Mouth Rinse Q2H   pantoprazole (PROTONIX) IV  40 mg Intravenous Q12H   polyethylene glycol  17 g Per Tube Daily   rifaximin  550 mg Per Tube BID   thiamine  100 mg Per Tube Daily   Or   thiamine  100 mg Intravenous Daily    sodium chloride Stopped (01/28/23 1435)   dexmedetomidine (PRECEDEX) IV infusion 0.8 mcg/kg/hr (02/02/23 1200)   feeding supplement (VITAL 1.5 CAL) Stopped (01/31/23 0520)   fentaNYL infusion INTRAVENOUS 150 mcg/hr (02/02/23 1200)   heparin 10,000 units/ 20 mL infusion syringe 500 Units/hr (02/02/23 0304)   prismasol BGK 2/2.5 replacement solution 400 mL/hr at 02/02/23 0147   prismasol BGK 2/2.5 replacement solution 400 mL/hr at 02/02/23 0147   prismasol BGK 4/2.5 1,500 mL/hr at  02/02/23 1101   bisacodyl, fentaNYL, heparin, ipratropium-albuterol, midazolam, ondansetron (ZOFRAN) IV, mouth rinse, polyethylene glycol

## 2023-02-02 NOTE — Progress Notes (Signed)
Subjective: Was hypotensive and started on Levophed but became tachycardic and subsequently switched to Neo-Synephrine. No change from yesterday otherwise. Continues to be on CRRT, less than 100 cc fluid removed today. Only has had a smear of bowel movement. NG tube clamped since 8:30 AM today. Minimal urine output.  Objective: Vital signs in last 24 hours: Temp:  [97.6 F (36.4 C)-98.1 F (36.7 C)] 98.1 F (36.7 C) (05/05 1254) Pulse Rate:  [43-136] 135 (05/05 1433) Resp:  [11-21] 20 (05/05 1433) BP: (81-168)/(38-105) 94/58 (05/05 1433) SpO2:  [94 %-100 %] 98 % (05/05 1433) FiO2 (%):  [40 %] 40 % (05/05 1132) Weight:  [112.2 kg] 112.2 kg (05/05 0300) Weight change: -4.1 kg Last BM Date : 02/01/23  PE: Intubated, sedated GENERAL: Overweight, deeply icteric  ABDOMEN: Distended, bowel sounds not heard EXTREMITIES: Bipedal edema  Lab Results: Results for orders placed or performed during the hospital encounter of 01/11/2023 (from the past 48 hour(s))  Renal function panel (daily at 1600)     Status: Abnormal   Collection Time: 01/31/23  3:31 PM  Result Value Ref Range   Sodium 133 (L) 135 - 145 mmol/L   Potassium 4.1 3.5 - 5.1 mmol/L   Chloride 101 98 - 111 mmol/L   CO2 23 22 - 32 mmol/L   Glucose, Bld 118 (H) 70 - 99 mg/dL    Comment: Glucose reference range applies only to samples taken after fasting for at least 8 hours.   BUN 46 (H) 6 - 20 mg/dL   Creatinine, Ser 7.82 (H) 0.61 - 1.24 mg/dL   Calcium 8.8 (L) 8.9 - 10.3 mg/dL   Phosphorus 3.8 2.5 - 4.6 mg/dL    Comment: ICTERUS AT THIS LEVEL MAY AFFECT RESULT   Albumin 3.4 (L) 3.5 - 5.0 g/dL   GFR, Estimated 29 (L) >60 mL/min    Comment: (NOTE) Calculated using the CKD-EPI Creatinine Equation (2021)    Anion gap 9 5 - 15    Comment: Performed at [facility], [address] [place], [place] [address]  Glucose, capillary     Status: Abnormal   Collection Time: 01/31/23  4:03 PM  Result Value Ref  Range   Glucose-Capillary 121 (H) 70 - 99 mg/dL    Comment: Glucose reference range applies only to samples taken after fasting for at least 8 hours.  Glucose, capillary     Status: Abnormal   Collection Time: 01/31/23  8:00 PM  Result Value Ref Range   Glucose-Capillary 117 (H) 70 - 99 mg/dL    Comment: Glucose reference range applies only to samples taken after fasting for at least 8 hours.  Glucose, capillary     Status: Abnormal   Collection Time: 01/31/23 11:50 PM  Result Value Ref Range   Glucose-Capillary 112 (H) 70 - 99 mg/dL    Comment: Glucose reference range applies only to samples taken after fasting for at least 8 hours.  Renal function panel (daily at 0500)     Status: Abnormal   Collection Time: 02/01/23  2:49 AM  Result Value Ref Range   Sodium 132 (L) 135 - 145 mmol/L   Potassium 3.8 3.5 - 5.1 mmol/L   Chloride 98 98 - 111 mmol/L   CO2 23 22 - 32 mmol/L   Glucose, Bld 107 (H) 70 - 99 mg/dL    Comment: Glucose reference range applies only to samples taken after fasting for at least 8 hours.   BUN 43 (H) 6 - 20  mg/dL   Creatinine, Ser 1.61 (H) 0.61 - 1.24 mg/dL   Calcium 8.8 (L) 8.9 - 10.3 mg/dL   Phosphorus 3.3 2.5 - 4.6 mg/dL    Comment: ICTERUS AT THIS LEVEL MAY AFFECT RESULT   Albumin 3.5 3.5 - 5.0 g/dL   GFR, Estimated 32 (L) >60 mL/min    Comment: (NOTE) Calculated using the CKD-EPI Creatinine Equation (2021)    Anion gap 11 5 - 15    Comment: Performed at [facility], [address] [place], [place] [address]  APTT     Status: Abnormal   Collection Time: 02/01/23  2:49 AM  Result Value Ref Range   aPTT 49 (H) 24 - 36 seconds    Comment:        IF BASELINE aPTT IS ELEVATED, SUGGEST PATIENT RISK ASSESSMENT BE USED TO DETERMINE APPROPRIATE ANTICOAGULANT THERAPY. Performed at [facility], [address], [place], [place] [address]   CBC     Status: Abnormal   Collection Time: 02/01/23  2:49 AM  Result Value Ref  Range   WBC 6.4 4.0 - 10.5 K/uL   RBC 3.34 (L) 4.22 - 5.81 MIL/uL   Hemoglobin 8.9 (L) 13.0 - 17.0 g/dL   HCT 11.9 (L) 14.7 - 82.9 %   MCV 87.7 80.0 - 100.0 fL   MCH 26.6 26.0 - 34.0 pg   MCHC 30.4 30.0 - 36.0 g/dL   RDW 56.2 (H) 13.0 - 86.5 %   Platelets 108 (L) 150 - 400 K/uL   nRBC 1.9 (H) 0.0 - 0.2 %    Comment: Performed at [facility], [address], [place], [place] [address]  Comprehensive metabolic panel     Status: Abnormal   Collection Time: 02/01/23  3:34 AM  Result Value Ref Range   Sodium 132 (L) 135 - 145 mmol/L   Potassium 3.8 3.5 - 5.1 mmol/L   Chloride 98 98 - 111 mmol/L   CO2 23 22 - 32 mmol/L   Glucose, Bld 107 (H) 70 - 99 mg/dL    Comment: Glucose reference range applies only to samples taken after fasting for at least 8 hours.   BUN 43 (H) 6 - 20 mg/dL   Creatinine, Ser 6.29 (H) 0.61 - 1.24 mg/dL   Calcium 8.8 (L) 8.9 - 10.3 mg/dL   Total Protein 8.5 (H) 6.5 - 8.1 g/dL   Albumin 3.5 3.5 - 5.0 g/dL   AST 44 (H) 15 - 41 U/L   ALT 22 0 - 44 U/L   Alkaline Phosphatase 48 38 - 126 U/L   Total Bilirubin 15.0 (H) 0.3 - 1.2 mg/dL   GFR, Estimated 31 (L) >60 mL/min    Comment: (NOTE) Calculated using the CKD-EPI Creatinine Equation (2021)    Anion gap 11 5 - 15    Comment: Performed at [facility], [address], [place], [place] [address]  Magnesium     Status: Abnormal   Collection Time: 02/01/23  3:34 AM  Result Value Ref Range   Magnesium 2.8 (H) 1.7 - 2.4 mg/dL    Comment: Performed at [facility], [address], [place], [place] [address]  Glucose, capillary     Status: Abnormal   Collection Time: 02/01/23  4:01 AM  Result Value Ref Range   Glucose-Capillary 107 (H) 70 - 99 mg/dL    Comment: Glucose reference range applies only to samples taken after fasting for at least 8 hours.  Glucose, capillary     Status: Abnormal   Collection Time: 02/01/23  8:21 AM  Result Value Ref Range    Glucose-Capillary 114 (H) 70 - 99 mg/dL    Comment: Glucose reference range applies only to samples taken after fasting for at least 8 hours.  Glucose, capillary     Status: Abnormal   Collection Time: 02/01/23 12:18 PM  Result Value Ref Range   Glucose-Capillary 119 (H) 70 - 99 mg/dL    Comment: Glucose reference range applies only to samples taken after fasting for at least 8 hours.  Renal function panel (daily at 1600)     Status: Abnormal   Collection Time: 02/01/23  4:14 PM  Result Value Ref Range   Sodium 130 (L) 135 - 145 mmol/L   Potassium 3.9 3.5 - 5.1 mmol/L   Chloride 98 98 - 111 mmol/L   CO2 23 22 - 32 mmol/L   Glucose, Bld 131 (H) 70 - 99 mg/dL    Comment: Glucose reference range applies only to samples taken after fasting for at least 8 hours.   BUN 42 (H) 6 - 20 mg/dL   Creatinine, Ser 1.61 (H) 0.61 - 1.24 mg/dL   Calcium 8.6 (L) 8.9 - 10.3 mg/dL   Phosphorus 3.9 2.5 - 4.6 mg/dL    Comment: ICTERUS AT THIS LEVEL MAY AFFECT RESULT   Albumin 3.1 (L) 3.5 - 5.0 g/dL   GFR, Estimated 35 (L) >60 mL/min    Comment: (NOTE) Calculated using the CKD-EPI Creatinine Equation (2021)    Anion gap 9 5 - 15    Comment: Performed at [facility], [address] [place], [place] [address]  Glucose, capillary     Status: Abnormal   Collection Time: 02/01/23  4:37 PM  Result Value Ref Range   Glucose-Capillary 122 (H) 70 - 99 mg/dL    Comment: Glucose reference range applies only to samples taken after fasting for at least 8 hours.   Comment 1 Notify RN    Comment 2 Document in Chart   Glucose, capillary     Status: Abnormal   Collection Time: 02/01/23  7:59 PM  Result Value Ref Range   Glucose-Capillary 119 (H) 70 - 99 mg/dL    Comment: Glucose reference range applies only to samples taken after fasting for at least 8 hours.  Glucose, capillary     Status: Abnormal   Collection Time: 02/01/23 11:41 PM  Result Value Ref Range   Glucose-Capillary 111 (H) 70 -  99 mg/dL    Comment: Glucose reference range applies only to samples taken after fasting for at least 8 hours.  Renal function panel (daily at 0500)     Status: Abnormal   Collection Time: 02/02/23  2:42 AM  Result Value Ref Range   Sodium 133 (L) 135 - 145 mmol/L   Potassium 3.7 3.5 - 5.1 mmol/L   Chloride 100 98 - 111 mmol/L   CO2 23 22 - 32 mmol/L   Glucose, Bld 114 (H) 70 - 99 mg/dL    Comment: Glucose reference range applies only to samples taken after fasting for at least 8 hours.   BUN 40 (H) 6 - 20 mg/dL   Creatinine, Ser 5.40 (H) 0.61 - 1.24 mg/dL   Calcium 8.6 (L) 8.9 - 10.3 mg/dL   Phosphorus 2.9 2.5 - 4.6 mg/dL    Comment: ICTERUS AT THIS LEVEL MAY AFFECT RESULT   Albumin 2.9 (L) 3.5 - 5.0 g/dL  GFR, Estimated 36 (L) >60 mL/min    Comment: (NOTE) Calculated using the CKD-EPI Creatinine Equation (2021)    Anion gap 10 5 - 15    Comment: Performed at [facility], [address], [place], [place] [address]  CBC     Status: Abnormal   Collection Time: 02/02/23  2:42 AM  Result Value Ref Range   WBC 6.0 4.0 - 10.5 K/uL   RBC 3.53 (L) 4.22 - 5.81 MIL/uL   Hemoglobin 9.4 (L) 13.0 - 17.0 g/dL   HCT 60.4 (L) 54.0 - 98.1 %   MCV 85.0 80.0 - 100.0 fL   MCH 26.6 26.0 - 34.0 pg   MCHC 31.3 30.0 - 36.0 g/dL   RDW 19.1 (H) 47.8 - 29.5 %   Platelets 86 (L) 150 - 400 K/uL    Comment: SPECIMEN CHECKED FOR CLOTS Immature Platelet Fraction may be clinically indicated, consider ordering this additional test [id number] REPEATED TO VERIFY PLATELET COUNT CONFIRMED BY SMEAR    nRBC 1.2 (H) 0.0 - 0.2 %    Comment: Performed at [facility], [address], [place], [place] [address]  Comprehensive metabolic panel     Status: Abnormal   Collection Time: 02/02/23  2:42 AM  Result Value Ref Range   Sodium 133 (L) 135 - 145 mmol/L   Potassium 3.7 3.5 - 5.1 mmol/L   Chloride 101 98 - 111 mmol/L   CO2 23 22 - 32 mmol/L   Glucose, Bld 112 (H) 70 -  99 mg/dL    Comment: Glucose reference range applies only to samples taken after fasting for at least 8 hours.   BUN 39 (H) 6 - 20 mg/dL   Creatinine, Ser 6.29 (H) 0.61 - 1.24 mg/dL   Calcium 8.5 (L) 8.9 - 10.3 mg/dL   Total Protein 7.9 6.5 - 8.1 g/dL   Albumin 2.9 (L) 3.5 - 5.0 g/dL   AST 43 (H) 15 - 41 U/L   ALT 24 0 - 44 U/L   Alkaline Phosphatase 49 38 - 126 U/L   Total Bilirubin 15.9 (H) 0.3 - 1.2 mg/dL   GFR, Estimated 35 (L) >60 mL/min    Comment: (NOTE) Calculated using the CKD-EPI Creatinine Equation (2021)    Anion gap 9 5 - 15    Comment: Performed at [facility], [address], [place], [place] [address]  Magnesium     Status: Abnormal   Collection Time: 02/02/23  2:42 AM  Result Value Ref Range   Magnesium 3.0 (H) 1.7 - 2.4 mg/dL    Comment: Performed at [facility], [address], [place], [place] [address]  Protime-INR     Status: Abnormal   Collection Time: 02/02/23  2:42 AM  Result Value Ref Range   Prothrombin Time 28.0 (H) 11.4 - 15.2 seconds   INR 2.7 (H) 0.8 - 1.2    Comment: (NOTE) INR goal varies based on device and disease states. Performed at [facility], [address] [place], [place] [address]   APTT     Status: Abnormal   Collection Time: 02/02/23  2:42 AM  Result Value Ref Range   aPTT 57 (H) 24 - 36 seconds    Comment:        IF BASELINE aPTT IS ELEVATED, SUGGEST PATIENT RISK ASSESSMENT BE USED TO DETERMINE APPROPRIATE ANTICOAGULANT THERAPY. Performed at [facility], [address], [place], [place] [address]   Glucose, capillary  Status: Abnormal   Collection Time: 02/02/23  3:10 AM  Result Value Ref Range   Glucose-Capillary 105 (H) 70 - 99 mg/dL    Comment: Glucose reference range applies only to samples taken after fasting for at least 8 hours.  Glucose, capillary     Status: Abnormal   Collection Time: 02/02/23  8:12 AM  Result Value Ref Range    Glucose-Capillary 105 (H) 70 - 99 mg/dL    Comment: Glucose reference range applies only to samples taken after fasting for at least 8 hours.   Comment 1 Notify RN    Comment 2 Document in Chart   Glucose, capillary     Status: Abnormal   Collection Time: 02/02/23 12:02 PM  Result Value Ref Range   Glucose-Capillary 119 (H) 70 - 99 mg/dL    Comment: Glucose reference range applies only to samples taken after fasting for at least 8 hours.    Studies/Results: DG CHEST PORT 1 VIEW  Result Date: 02/02/2023 CLINICAL DATA:  Follow-up.  Intubated in patient. EXAM: PORTABLE CHEST 1 VIEW COMPARISON:  01/31/2023. FINDINGS: Stable enlargement of the cardiac silhouette. New opacity at the left lung base obscures hemidiaphragm. Increased opacity at the right lung base. Hazy opacity noted in the right upper lung on the prior exam appears improved. No pneumothorax. Endotracheal tube, nasal/orogastric tube and right internal jugular central venous catheter are stable. IMPRESSION: 1. Improved airspace opacity in the right upper lung. Suspect improved asymmetric edema 2. Increased opacity at the lung bases suspected to be a combination of pleural effusions and atelectasis. 3. Stable support apparatus. Electronically Signed   By: Amie Portland M.D.   On: 02/02/2023 10:35   DG Abd 1 View  Result Date: 02/01/2023 CLINICAL DATA:  Follow-up ileus. EXAM: ABDOMEN - 1 VIEW COMPARISON:  01/31/2023 and 01/30/2023.  CT, 01/11/2023. FINDINGS: Mildly distended air-filled colon. Several loops of prominent air-filled small bowel also noted. Appearance is similar to the previous day's exam. Stable nasal/orogastric tube, tip well within the stomach. IMPRESSION: 1. No significant change from the previous day's study. 2. Persistent mild gaseous distension of small bowel and colon consistent with adynamic ileus. Electronically Signed   By: Amie Portland M.D.   On: 02/01/2023 10:07    Medications: I have reviewed the patient's current  medications.  Assessment: Decompensated alcohol-related cirrhosis MELD sodium score 41(sodium 133, creatinine 2.37, T. bili 15.9, INR 2.7)  Acute alcoholic hepatitis, hepatic discriminant function 90-not responding to prednisolone, will DC today  Small esophageal varices, gastric varices, splenomegaly, large portosystemic venous collaterals, not a candidate for TIPS because of advanced MELD score and vascular anatomy  Respiratory failure Renal failure Anemia Thrombocytopenia Adynamic ileus Tachycardia?  Atrial flutter Hypotension, started on IV pressors and IV albumin  Plan: Very poor prognosis Ongoing discussion with family members regarding comfort measures Continue supportive care meanwhile  Kerin Salen, MD 02/02/2023, 3:12 PM

## 2023-02-03 DIAGNOSIS — I8511 Secondary esophageal varices with bleeding: Secondary | ICD-10-CM

## 2023-02-03 DIAGNOSIS — N179 Acute kidney failure, unspecified: Secondary | ICD-10-CM | POA: Diagnosis not present

## 2023-02-03 DIAGNOSIS — I471 Supraventricular tachycardia, unspecified: Secondary | ICD-10-CM

## 2023-02-03 DIAGNOSIS — Z7189 Other specified counseling: Secondary | ICD-10-CM | POA: Diagnosis not present

## 2023-02-03 DIAGNOSIS — Z515 Encounter for palliative care: Secondary | ICD-10-CM | POA: Diagnosis not present

## 2023-02-03 DIAGNOSIS — L8990 Pressure ulcer of unspecified site, unspecified stage: Secondary | ICD-10-CM | POA: Insufficient documentation

## 2023-02-03 LAB — RENAL FUNCTION PANEL
Albumin: 3.6 g/dL (ref 3.5–5.0)
Anion gap: 11 (ref 5–15)
BUN: 35 mg/dL — ABNORMAL HIGH (ref 6–20)
CO2: 22 mmol/L (ref 22–32)
Calcium: 8.7 mg/dL — ABNORMAL LOW (ref 8.9–10.3)
Chloride: 100 mmol/L (ref 98–111)
Creatinine, Ser: 1.93 mg/dL — ABNORMAL HIGH (ref 0.61–1.24)
GFR, Estimated: 45 mL/min — ABNORMAL LOW (ref >60)
Glucose, Bld: 99 mg/dL (ref 70–99)
Phosphorus: 2 mg/dL — ABNORMAL LOW (ref 2.5–4.6)
Potassium: 3.6 mmol/L (ref 3.5–5.1)
Sodium: 133 mmol/L — ABNORMAL LOW (ref 135–145)

## 2023-02-03 LAB — GLUCOSE, CAPILLARY
Glucose-Capillary: 100 mg/dL — ABNORMAL HIGH (ref 70–99)
Glucose-Capillary: 103 mg/dL — ABNORMAL HIGH (ref 70–99)
Glucose-Capillary: 104 mg/dL — ABNORMAL HIGH (ref 70–99)
Glucose-Capillary: 105 mg/dL — ABNORMAL HIGH (ref 70–99)
Glucose-Capillary: 113 mg/dL — ABNORMAL HIGH (ref 70–99)
Glucose-Capillary: 80 mg/dL (ref 70–99)
Glucose-Capillary: 97 mg/dL (ref 70–99)

## 2023-02-03 LAB — CULTURE, RESPIRATORY W GRAM STAIN

## 2023-02-03 LAB — MAGNESIUM: Magnesium: 3.1 mg/dL — ABNORMAL HIGH (ref 1.7–2.4)

## 2023-02-03 LAB — APTT: aPTT: 63 seconds — ABNORMAL HIGH (ref 24–36)

## 2023-02-03 MED ORDER — HEPARIN SODIUM (PORCINE) 1000 UNIT/ML IJ SOLN
1000.0000 [IU] | INTRAMUSCULAR | Status: DC | PRN
  Administered 2023-02-03: 1000 [IU]
  Filled 2023-02-03: qty 3

## 2023-02-03 NOTE — Progress Notes (Signed)
Chaplain spent time with [name]'s SO, [name], to offer support.  Her main concern is how to support their 39 year old daughter.  Chaplain gathered resources for supporting children through grief and brought them to [name] to share with [name].  Chaplain provided listening as [name] processed her own grief.  Chaplain normalized grief responses and provided grief education. She has good support from family and friends. She and [name] have been together for 19 years. His parents and siblings are also very involved with his care.  She feels that she wants to ask him what his wishes are, but does not want to cause stress and have him code--she does not want to live with that.  While chaplain was present with her, [name] received a call from kidney doctor, who confirmed what other providers had been telling her about his prognosis. Chaplain acknowledged that [name]'s body [name] be making the decision.    [address], Bcc Pager, [phone]

## 2023-02-03 NOTE — Progress Notes (Signed)
[name] 11:03 AM  Subjective: Patient seen and examined and case discussed with my partner this weekend as well as only member at bedside and he has not been restarted on tube feeds and did have a minimal amount of bowel movement but no other major changes and appreciate palliative care note  Objective: Vital signs stable afebrile no acute distress intubated not obviously responsive to me abdomen is nontender And creatinine slight decrease  Assessment: Multiple medical problems  Plan: Please let me know if I can be of any assistance this week from a GI standpoint  Roselynne Lortz E  office [phone] After 5PM or if no answer call [phone]

## 2023-02-03 NOTE — Progress Notes (Signed)
NAME:  [name], MRN:  [id number], DOB:  02-24-1986, LOS: 11 ADMISSION DATE:  01/20/2023, CONSULTATION DATE:  4/28 REFERRING MD:  Dr. Eloise Harman , CHIEF COMPLAINT:  Weakness    History of Present Illness:  37 year old man presents to the ED with weakness found to have hemoglobin of 3.   He denies any hematemesis.  No hemoptysis.  No hematochezia.  Denies melena.  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  Last drink 2 days prior to admission.  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  He was hospitalized 10/2022 with GI bleed.  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  Also some portal hypertensive gastropathy.  At home he takes Lasix and spironolactone for lower extremity edema.  He also takes lactulose.   Upon arrival, he was tachycardic to the 170s and SVT.  He was cardioverted.  He remained normotensive.  He remains tachycardic but less so.  His T. bili seems at baseline.  LFTs seem at recent baseline as well.  Last month notable for acute renal failure, severe elevation in creatinine. Denied signs of GI bleeding. Initial PLTs 222, INR 2.5, scr 1.6 (baseline 0.5 range). Got blood (4 units PRBC) admitted to ICU. GI consulted. Placed on Octreotide + PPI infusion. GI recommended CT angio abdomen and pelvis with BRTO. Empiric CTX initiated.  Pertinent  Medical History  Alcoholic Cirrhosis with hx of Varices, Portal Hypertensive Gastropathy   Significant Hospital Events: Including procedures, antibiotic start and stop dates in addition to other pertinent events   4/25 Admit with weakness in setting of Hgb 3. GI consulted. Octreotide + PPI. Empiric CTX.  4/26 Increased WOB, gross hematuria w/clots.  CTA Abd/pelvis showed cirrhotic liver with borderline splenomegaly, large portosystemic venous collaterals, small gastric & esophageal varices, no ascites, cholelithiasis, small bilateral pleural effusions R>L. Hgb 5.3  overnight, 2 more units PRBC and lasix. Urology evaluation, no blood in foley after insertion.  Intubated.  MELDNa 33. IR consulted, not a candidate for TIPS b/c of advanced MELD score and vascular anatomy. No response to lasix. MRSA PCR neg.   4/27 Remains on vent with propofol/fentanyl, octreotide. Worsening AKI. EGD showed small esophageal varices, no evidence of active bleeding, OGT left in place.  4/28 Failed SBT. On fentanyl. Jaundice with worsening bili/INR.  Worsening creatinine, decreasing UOP. BP soft, Hgb <7, 1 unit PRBC given.  4/29 Failed SBT, intermittent vent dyssynchrony 4/30 started on CRRT 5/2 SVT episode overnight, resolved with 1 dose Versed. 5/3 Lower extremity duplex without DVT 5/5 Met with palliative care made DNR 5/6 Remains on CRRT sedated on fent, family at bedside    Interim History / Subjective:  No acute event remains on CRRT   Objective   Blood pressure 130/84, pulse (!) 131, temperature (!) 97.5 F (36.4 C), temperature source Axillary, resp. rate 20, height 5\' 9"  (1.753 m), weight 111.6 kg, SpO2 100 %. CVP:  [18 mmHg] 18 mmHg  Vent Mode: PCV FiO2 (%):  [40 %] 40 % Set Rate:  [20 bmp] 20 bmp PEEP:  [8 cmH20] 8 cmH20 Plateau Pressure:  [20 cmH20-25 cmH20] 23 cmH20   Intake/Output Summary (Last 24 hours) at 02/03/2023 0731 Last data filed at 02/03/2023 0700 Gross per 24 hour  Intake 1801.26 ml  Output 2882.1 ml  Net -1080.84 ml    Filed Weights   02/01/23 0400 02/02/23 0300 02/03/23 0500  Weight: 116.3 kg 112.2 kg 111.6 kg  Examination: General: Acute on chronically ill appearing adult male lying in bed on mechanical ventilation, in NAD HEENT: ETT, MM pink/moist, PERRL,  Neuro: Sedated on vent, unable to follow commands  CV: s1s2 regular rate and rhythm, no murmur, rubs, or gallops,  PULM:  Clear to auscultation, no increased work of breathing, no added breath sounds, tolerating vent GI: soft, bowel sounds active in all 4 quadrants, non-tender,  non-distended, tolerating TF Extremities: warm/dry, generalized  edema  Skin: no rashes or lesions  Assessment & Plan:   Acute Blood Loss Anemia  -In setting of hematuria, GIB. Largest contributor suspected to be GI given hx of cirrhosis, prior EGD findings and portal hypertensive gastropathy with varices. No hematemesis this admit.  Had significant hematuria with clots but no blood in foley on insertion - thought by Urology that he could have had a uretheral lesion that foley created tamponade. Varix on EGD but not banded. Hx recurrent gum bleeding per brother.  P: GI following, appreciate assistance Follow CBC Transfuse per protocol  Hgb goal > 7 Continue BID PPI   Decompensated Alcoholic Cirrhosis, acute ETOH hepatitis ETOH Abuse  -Nodular contour on RUQ [place], confirmed cirrhosis on CTA ABD.  Was sober for 8 months prior to admit, relapsed 2 days prior to admit and that was last drink. Maddrey's Discriminant Function Score 68 4/27. Worsening T. Bili/INR. Hepatitis panel negative.   -Steroids started 4/27 no improvement seen in INR so therapy stopped after 7 days  P: GI following MELD score remains elevated at 40 Bilirubin remains elevated at 15.9 Continue supportive care, prognosis remains poor    Ileus P: Tube feeds remains  NG tube remains to LIWS Continue bowel regiment   Acute Hepatic / Metabolic Encephalopathy  P: Maintain neuro protective measures Continue lactulose and Xifaxan  Minimize sedation, PAD protocol  Nutrition and bowel regiment  Seizure precautions  Aspirations precautions  Continue Thiamine, folate, and MVI  Acute Hypoxic Respiratory Failure in setting of Pulmonary Edema, Possible MSSA Aspiration PNA vs Tracheobronchitis s/p course of CTX P: Continue ventilator support with lung protective strategies  Wean PEEP and FiO2 for sats greater than 90%. Head of bed elevated 30 degrees. Plateau pressures less than 30 cm H20.  Follow intermittent chest x-ray  and ABG.   SAT/SBT as tolerated, mentation preclude extubation  Ensure adequate pulmonary hygiene  Follow cultures  VAP bundle in place  PAD protocol  Tachycardia, paroxysmal atrial for flutter -Suspicion for PE is low. No evidence of clot on lower extremity duplex P: Continuous telemetry   AKI - improving on CRRT which was started 4/30 Hyponatremia  -UNa 45, more consistent with ATN due to shock/ischemic perfusion, hyperbilirubinemia.  HRS less likely (urine Na should be <10) P: Nephrology following, appreciate assistance  CRRT per Nephrology  Follow renal function  Monitor urine output Trend Bmet Avoid nephrotoxins Ensure adequate renal perfusion   Hematuria / Clots  -Interestingly, no blood in foley once placed, ? Urethral lesion  P: Appreicate urology assistance  Follow urine output   LLE Wound  -3cm x 2cm x 0.4cm LE lesion  P: Local wound care   Hyperglycemia  P: Continue SSI  CBG goal 140-180 CBG checks q4   GOC Last date of multidisciplinary goals of care discussion: Brother, [name], updated at bedside 5/5.  Reviewed his clinical course over the past few days utility of time limited trial of HD.  He is still not making urine and this is day 5 of renal replacement.  His  liver function is worse with worsening MELD score.  Recommend to the point where we had to make a decision and I have recommended consideration of comfort measures.  Family met with palliative care and code status changed to DNR   Best Practice (right click and "Reselect all SmartList Selections" daily)  Diet/type: NPO DVT prophylaxis: SCD. Defer chemical prophylaxis due to concern for bleed GI prophylaxis: PPI Lines: Dialysis Catheter Foley:  Yes, and it is still needed Code Status:  full code  Critical care time:   Performed by: [name]  Total critical care time: 38 minutes  Critical care time was exclusive of separately billable procedures and treating other  patients.  Critical care was necessary to treat or prevent imminent or life-threatening deterioration.  Critical care was time spent personally by me on the following activities: development of treatment plan with patient and/or surrogate as well as nursing, discussions with consultants, evaluation of patient's response to treatment, examination of patient, obtaining history from patient or surrogate, ordering and performing treatments and interventions, ordering and review of laboratory studies, ordering and review of radiographic studies, pulse oximetry and re-evaluation of patient's condition.  Joshua Engdahl D. Harris, NP-C [facility] Personal contact information can be found on Amion  If no contact or response made please call [phone] 02/03/2023, 9:03 AM

## 2023-02-03 NOTE — Progress Notes (Signed)
Daily Progress Note   Patient Name: [name]       Date: 02/03/2023 DOB: 04-09-1986  Age: 37 y.o. MRN#: [id number] Attending Physician: Luciano Cutter, MD Primary Care Physician: Malka So., MD Admit Date: 01/03/2023  Reason for Consultation/Follow-up: Establishing goals of care  Subjective: Patient remains on ventilatory support and CRRT, is on fentanyl, has a low blood pressure and high heart rate.  Significant other Queenie present at bedside.  She states that that overall, the family is aware of the serious irreversible nature of the patient's illness and are preparing themselves for the possibility of one-way extubation, comfort care and are aware of the possibility of   in-hospital death.     Length of Stay: 11  Current Medications: Scheduled Meds:   Chlorhexidine Gluconate Cloth  6 each Topical Q0600   docusate  100 mg Per Tube BID   feeding supplement (PROSource TF20)  60 mL Per Tube TID   folic acid  1 mg Per Tube Daily   insulin aspart  0-15 Units Subcutaneous Q4H   lactulose  20 g Per Tube BID   leptospermum manuka honey  1 Application Topical Daily   midodrine  5 mg Per Tube TID WC   multivitamin  15 mL Per Tube Daily   mouth rinse  15 mL Mouth Rinse Q2H   pantoprazole (PROTONIX) IV  40 mg Intravenous Q12H   polyethylene glycol  17 g Per Tube Daily   rifaximin  550 mg Per Tube BID   thiamine  100 mg Per Tube Daily   Or   thiamine  100 mg Intravenous Daily    Continuous Infusions:   prismasol BGK 4/2.5 400 mL/hr at 02/03/23 0656    prismasol BGK 4/2.5 400 mL/hr at 02/03/23 0704   sodium chloride Stopped (01/28/23 1435)   albumin human 50 g (02/03/23 1011)   dexmedetomidine (PRECEDEX) IV infusion 1 mcg/kg/hr (02/03/23 1000)   feeding supplement (VITAL 1.5  CAL) Stopped (01/31/23 0520)   fentaNYL infusion INTRAVENOUS 150 mcg/hr (02/03/23 1000)   heparin 10,000 units/ 20 mL infusion syringe 500 Units/hr (02/03/23 0800)   phenylephrine (NEO-SYNEPHRINE) Adult infusion Stopped (02/02/23 1554)   prismasol BGK 4/2.5 1,500 mL/hr at 02/03/23 1026    PRN Meds: bisacodyl, fentaNYL, heparin, ipratropium-albuterol, midazolam, ondansetron (ZOFRAN) IV, mouth rinse, polyethylene glycol  Physical Exam         On vent Monitor noted Appears chronically ill Has edema  Vital Signs: BP 134/67   Pulse 86   Temp (!) 97.5 F (36.4 C) (Axillary)   Resp 20   Ht 5\' 9"  (1.753 m)   Wt 111.6 kg   SpO2 98%   BMI 36.33 kg/m  SpO2: SpO2: 98 % O2 Device: O2 Device: Ventilator O2 Flow Rate: O2 Flow Rate (L/min): 4 L/min  Intake/output summary:  Intake/Output Summary (Last 24 hours) at 02/03/2023 1048 Last data filed at 02/03/2023 1033 Gross per 24 hour  Intake 1887.67 ml  Output 3071.7 ml  Net -1184.03 ml    LBM: Last BM Date : 02/01/23 Baseline Weight: Weight: 99.8 kg Most recent weight: Weight: 111.6 kg       Palliative Assessment/Data:      Patient Active Problem List   Diagnosis Date Noted   SVT (supraventricular tachycardia) 02/03/2023   Esophageal varices with bleeding in diseases classified elsewhere (HCC) 02/03/2023   Pressure injury of skin 02/03/2023   Decompensated hepatic cirrhosis (HCC) 01/28/2023   Acute respiratory failure with hypoxia (HCC) 01/24/2023   Gross hematuria 01/24/2023   Anemia 01/03/2023   Alcohol withdrawal (HCC) 10/27/2022   Alcohol intoxication in active alcoholic without complication (HCC) 10/25/2022   Elevated AFP 08/20/2022   Hyperammonemia (HCC) 07/31/2022   Class 1 obesity 07/28/2022   Hypokalemia 07/28/2022   Hypomagnesemia 07/28/2022   Symptomatic anemia 07/27/2022   Thrombocytopenia (HCC) 07/27/2022   GERD (gastroesophageal reflux disease) 07/26/2022   Coagulopathy (HCC) 07/26/2022   Hematoma  07/26/2022   Hyperbilirubinemia 07/26/2022   Hypoalbuminemia 01/29/2022   Iron deficiency anemia 01/12/2022   Apnea 01/11/2022   Murmur 01/11/2022   Alcoholic cirrhosis of liver without ascites (HCC) 05/21/2021   GI bleeding 04/25/2021   Essential hypertension 04/25/2021   Acute blood loss anemia 04/25/2021   Hyperglobulinemia 12/23/2019   Prediabetes 04/29/2019   Proteinuria 04/29/2019    Palliative Care Assessment & Plan   Patient Profile:    Assessment: Ventilator dependent respiratory failure Acute kidney injury Decompensated EtOH cirrhosis Shock and hyperkalemia MSSA aspiration pneumonia versus tracheobronchitis Palliative care following for goals of care discussions. Acute alcoholic hepatitis All esophageal varices gastric varices splenomegaly  Recommendations/Plan: Discussed with PCCM colleagues, appreciate bedside nursing support, discussed with significant other at bedside.  Overall, family is slowly but surely preparing themselves for the patient not surviving this hospitalization and for the fact that the patient remains critically ill without meaningful improvement despite aggressive interventions thus far.  Recommend one-way extubation, full scope of comfort measures and chaplain support.    Code Status: now DNR, remains on vent and CRRT, family considering one-way extubation.     Code Status Orders  (From admission, onward)           Start     Ordered   01/16/2023 1016  Full code  Continuous       Question:  By:  Answer:  Consent: discussion documented in EHR   01/15/2023 1017           Code Status History     Date Active Date Inactive Code Status Order ID Comments User Context   10/25/2022 1538 11/02/2022 1821 Full Code [id number]  Bobette Mo, MD ED   07/26/2022 1627 07/28/2022 2006 Full Code [id number]  Teddy Spike, DO ED   01/12/2022 1357 01/16/2022 1836 Full Code [id number]  Quincy Simmonds, MD ED   04/25/2021 1446 04/28/2021 1850  Full Code  [id number]  Arrien, York Ram, MD Inpatient       Prognosis:  Hours - Days  Discharge Planning: Anticipated Hospital Death  Care plan was discussed with  PCCM and Reita May.   Thank you for allowing the Palliative Medicine Team to assist in the care of this patient.  mod MDM.      Greater than 50%  of this time was spent counseling and coordinating care related to the above assessment and plan.  Rosalin Hawking, MD  Please contact Palliative Medicine Team phone at [phone] for questions and concerns.

## 2023-02-03 NOTE — Progress Notes (Addendum)
[facility] Progress Note  Assessment/ Plan: AKI - b/l creat 0.50 from 11/01/22. Creat here was 1.6 on admission 4/25 and has risen up to 2.8 and then 4.1 at time of consult. Pt developed shock on 4/28 requiring pressors for 1-2 days and UOP dropped off around the same time. UNa was 45. Suspected ATN or hemodynamic AKI due to shock and/or hyperbilirubinemia. HRS is less likely - it is usually slower to evolve and UNa should be < 10. Pt w/ severe vol overload still, CVP down but still signif elevated. Still has LE edema. Started time-limited trial of CRRT on 4/29 and agree reasonable to discontinue CRRT especially in light of no significant signs of improvement and high MELD score + comorbidities. Appreciate palliative care leading discussions and establishing GOC.  Seen on CRRT with UF 50-151ml/hr as tolerated, HR in the upper 90's. 4K baths 400/400/1500  Shock - SP pressor support 4/28- 4/29. Remains off pressors today.  Potassium - was high , then low -> repletion. Vol overload - CRRT started for marked vol overload +/- pulm edema. After 48 hrs of moderately aggressive fluid removal, pt's HR's gradually ^'d up into the 130s, sinus tach. The UF was turned off and HR's dropped slowly back down into the 90s. UF started up again 5/4 w/ more modest goals (50- 125 cc/hr net neg). Continue while on CRRT.  ETOH abuse - last drink 2 days before admission, prior to that had stopped for about 8 mos Decomp alcoholic cirrhosis  AHRF - w/ vol overload +/- aspiration. Repeat CXR 5/3 no change.  MSSA asp PNA vs tracheobronchitis - SP course of IV rocephin.  Anemia - transfuse prn Prognosis/ GOC - palliative care is seeing the patient  Subjective: pt seen in ICU. No pressors. BP's have dropped the last few hours. BP's are low but HR's are okay. CVP is down from 25 two days ago  Vitals:   02/03/23 1000 02/03/23 1100 02/03/23 1200 02/03/23 1300  BP: 134/67 109/66 120/63 135/82  Pulse: 86 77     Resp: 20 20 20 20   Temp:   (!) 97.4 F (36.3 C)   TempSrc:   Axillary   SpO2: 98% 99%    Weight:      Height:        Exam: General: obese BM-  sedated on the vent Heart: RRR Lungs: dec BS at bases Abdomen: soft, dec'd BS Extremities: diffuse bilat 2-3+ LE edema, less tight  Skin: warm and dry Neuro: sedated    UA 4/26 - prot 30, rbc 21-50 rbc, 6-10 wbc, rare bact  UNa - 45   UCr - 248   Renal US - 12.1/ 11.9 cm kidneys w/o hydro or mass   B/l creatinine = 0.50 from 11/01/22       Recent Labs  Lab 02/01/23 0249 02/01/23 0334 02/02/23 0242 02/02/23 1529 02/03/23 0249  HGB 8.9*  --  9.4*  --   --   ALBUMIN 3.5   < > 2.9*  2.9* 3.3* 3.6  CALCIUM 8.8*   < > 8.5*  8.6* 8.8* 8.7*  PHOS 3.3   < > 2.9 2.3* 2.0*  CREATININE 2.57*   < > 2.37*  2.30* 1.97* 1.93*  K 3.8   < > 3.7  3.7 3.4* 3.6   < > = values in this interval not displayed.   No results for input(s): "IRON", "TIBC", "FERRITIN" in the last 168 hours. Inpatient medications:  Chlorhexidine Gluconate Cloth  6 each  Topical Q0600   docusate  100 mg Per Tube BID   feeding supplement (PROSource TF20)  60 mL Per Tube TID   folic acid  1 mg Per Tube Daily   insulin aspart  0-15 Units Subcutaneous Q4H   lactulose  20 g Per Tube BID   leptospermum manuka honey  1 Application Topical Daily   midodrine  5 mg Per Tube TID WC   multivitamin  15 mL Per Tube Daily   mouth rinse  15 mL Mouth Rinse Q2H   pantoprazole (PROTONIX) IV  40 mg Intravenous Q12H   polyethylene glycol  17 g Per Tube Daily   rifaximin  550 mg Per Tube BID   thiamine  100 mg Per Tube Daily   Or   thiamine  100 mg Intravenous Daily     prismasol BGK 4/2.5 400 mL/hr at 02/03/23 0656    prismasol BGK 4/2.5 400 mL/hr at 02/03/23 0704   sodium chloride Stopped (01/28/23 1435)   albumin human 50 g (02/03/23 1011)   dexmedetomidine (PRECEDEX) IV infusion 1 mcg/kg/hr (02/03/23 1300)   feeding supplement (VITAL 1.5 CAL) Stopped (01/31/23 0520)    fentaNYL infusion INTRAVENOUS 25 mcg/hr (02/03/23 1300)   heparin 10,000 units/ 20 mL infusion syringe 500 Units/hr (02/03/23 0800)   phenylephrine (NEO-SYNEPHRINE) Adult infusion Stopped (02/02/23 1554)   prismasol BGK 4/2.5 1,500 mL/hr at 02/03/23 1026   bisacodyl, fentaNYL, heparin, ipratropium-albuterol, midazolam, ondansetron (ZOFRAN) IV, mouth rinse, polyethylene glycol

## 2023-02-03 NOTE — Progress Notes (Signed)
CRRT discontinued.  blood returned.  HD cath red and blue lines heparin locked, dead end cap and alert sticker in place.

## 2023-02-04 DIAGNOSIS — K7290 Hepatic failure, unspecified without coma: Secondary | ICD-10-CM

## 2023-02-04 DIAGNOSIS — R4589 Other symptoms and signs involving emotional state: Secondary | ICD-10-CM

## 2023-02-04 DIAGNOSIS — I8511 Secondary esophageal varices with bleeding: Secondary | ICD-10-CM | POA: Diagnosis not present

## 2023-02-04 DIAGNOSIS — R310 Gross hematuria: Secondary | ICD-10-CM

## 2023-02-04 DIAGNOSIS — Z66 Do not resuscitate: Secondary | ICD-10-CM

## 2023-02-04 DIAGNOSIS — Z711 Person with feared health complaint in whom no diagnosis is made: Secondary | ICD-10-CM

## 2023-02-04 DIAGNOSIS — I471 Supraventricular tachycardia, unspecified: Secondary | ICD-10-CM

## 2023-02-04 DIAGNOSIS — Z515 Encounter for palliative care: Secondary | ICD-10-CM | POA: Diagnosis not present

## 2023-02-04 DIAGNOSIS — F1010 Alcohol abuse, uncomplicated: Secondary | ICD-10-CM

## 2023-02-04 DIAGNOSIS — Z7189 Other specified counseling: Secondary | ICD-10-CM

## 2023-02-04 DIAGNOSIS — N179 Acute kidney failure, unspecified: Secondary | ICD-10-CM | POA: Diagnosis not present

## 2023-02-04 DIAGNOSIS — K7460 Unspecified cirrhosis of liver: Secondary | ICD-10-CM

## 2023-02-04 LAB — BASIC METABOLIC PANEL
Anion gap: 9 (ref 5–15)
BUN: 44 mg/dL — ABNORMAL HIGH (ref 6–20)
CO2: 23 mmol/L (ref 22–32)
Calcium: 9.4 mg/dL (ref 8.9–10.3)
Chloride: 101 mmol/L (ref 98–111)
Creatinine, Ser: 1.94 mg/dL — ABNORMAL HIGH (ref 0.61–1.24)
GFR, Estimated: 44 mL/min — ABNORMAL LOW (ref >60)
Glucose, Bld: 113 mg/dL — ABNORMAL HIGH (ref 70–99)
Potassium: 3.8 mmol/L (ref 3.5–5.1)
Sodium: 133 mmol/L — ABNORMAL LOW (ref 135–145)

## 2023-02-04 LAB — CBC
HCT: 28.1 % — ABNORMAL LOW (ref 39.0–52.0)
Hemoglobin: 8.7 g/dL — ABNORMAL LOW (ref 13.0–17.0)
MCH: 26.4 pg (ref 26.0–34.0)
MCHC: 31 g/dL (ref 30.0–36.0)
MCV: 85.4 fL (ref 80.0–100.0)
Platelets: 58 10*3/uL — ABNORMAL LOW (ref 150–400)
RBC: 3.29 MIL/uL — ABNORMAL LOW (ref 4.22–5.81)
RDW: 19.9 % — ABNORMAL HIGH (ref 11.5–15.5)
WBC: 8 10*3/uL (ref 4.0–10.5)
nRBC: 0 % (ref 0.0–0.2)

## 2023-02-04 LAB — APTT: aPTT: 51 seconds — ABNORMAL HIGH (ref 24–36)

## 2023-02-04 LAB — TROPONIN I (HIGH SENSITIVITY)
Troponin I (High Sensitivity): 22 ng/L — ABNORMAL HIGH (ref <18)
Troponin I (High Sensitivity): 22 ng/L — ABNORMAL HIGH (ref <18)

## 2023-02-04 LAB — PHOSPHORUS: Phosphorus: 3 mg/dL (ref 2.5–4.6)

## 2023-02-04 LAB — GLUCOSE, CAPILLARY
Glucose-Capillary: 95 mg/dL (ref 70–99)
Glucose-Capillary: 99 mg/dL (ref 70–99)

## 2023-02-04 LAB — MAGNESIUM: Magnesium: 3 mg/dL — ABNORMAL HIGH (ref 1.7–2.4)

## 2023-02-04 MED ORDER — POTASSIUM CHLORIDE 20 MEQ PO PACK
20.0000 meq | PACK | Freq: Once | ORAL | Status: AC
  Administered 2023-02-04: 20 meq
  Filled 2023-02-04: qty 1

## 2023-02-04 MED ORDER — GLYCOPYRROLATE 0.2 MG/ML IJ SOLN
0.2000 mg | Freq: Four times a day (QID) | INTRAMUSCULAR | Status: DC | PRN
  Administered 2023-02-04: 0.2 mg via INTRAVENOUS
  Filled 2023-02-04: qty 1

## 2023-02-04 MED ORDER — GLYCOPYRROLATE 0.2 MG/ML IJ SOLN
0.2000 mg | INTRAMUSCULAR | Status: DC
  Administered 2023-02-04 (×2): 0.2 mg via INTRAVENOUS
  Filled 2023-02-04 (×2): qty 1

## 2023-02-04 MED ORDER — POTASSIUM CHLORIDE 10 MEQ/100ML IV SOLN
10.0000 meq | INTRAVENOUS | Status: AC
  Administered 2023-02-04 (×2): 10 meq via INTRAVENOUS
  Filled 2023-02-04 (×2): qty 100

## 2023-02-04 MED ORDER — LABETALOL HCL 5 MG/ML IV SOLN
10.0000 mg | INTRAVENOUS | Status: DC | PRN
  Administered 2023-02-04: 10 mg via INTRAVENOUS
  Filled 2023-02-04: qty 4

## 2023-02-04 MED ORDER — DOPAMINE-DEXTROSE 3.2-5 MG/ML-% IV SOLN
2.5000 ug/kg/min | INTRAVENOUS | Status: DC
  Administered 2023-02-04: 2.5 ug/kg/min via INTRAVENOUS
  Filled 2023-02-04: qty 250

## 2023-02-04 MED ORDER — LORAZEPAM 2 MG/ML IJ SOLN
1.0000 mg | INTRAMUSCULAR | Status: DC | PRN
  Administered 2023-02-04: 2 mg via INTRAVENOUS
  Filled 2023-02-04: qty 1

## 2023-02-04 MED ORDER — SCOPOLAMINE 1 MG/3DAYS TD PT72
1.0000 | MEDICATED_PATCH | TRANSDERMAL | Status: DC
  Administered 2023-02-04: 1.5 mg via TRANSDERMAL
  Filled 2023-02-04: qty 1

## 2023-02-04 MED ORDER — PROPOFOL 1000 MG/100ML IV EMUL
5.0000 ug/kg/min | INTRAVENOUS | Status: DC
  Administered 2023-02-04: 5 ug/kg/min via INTRAVENOUS
  Administered 2023-02-04: 32.5 ug/kg/min via INTRAVENOUS
  Filled 2023-02-04 (×2): qty 100

## 2023-02-05 DIAGNOSIS — N179 Acute kidney failure, unspecified: Secondary | ICD-10-CM | POA: Insufficient documentation

## 2023-02-05 DIAGNOSIS — K7682 Hepatic encephalopathy: Secondary | ICD-10-CM | POA: Insufficient documentation

## 2023-02-05 DIAGNOSIS — J690 Pneumonitis due to inhalation of food and vomit: Secondary | ICD-10-CM | POA: Insufficient documentation

## 2023-03-01 NOTE — Progress Notes (Addendum)
1200 vent check has been held. RT has order to extubate but RN states that Elink indicated that [facility] needed to be contacted.

## 2023-03-01 NOTE — Progress Notes (Signed)
Chaplain follow-up and offered support. Chaplain engaged in some narrative life review. [name]'s dad voiced that his son truly had a big heart and cared more for others than himself. His life partner described him as a "generous soul." Chaplain upheld their love for him and offered support.     02/07/2023 1500  Spiritual Encounters  Type of Visit Initial;Follow up  Referral source Chaplain team  Reason for visit End-of-life

## 2023-03-01 NOTE — Procedures (Signed)
Extubation Procedure Note  Patient Details:   Name: [name] DOB: 23-Feb-1986 MRN: [id number]   Airway Documentation:    Vent end date: 02/16/2023 Vent end time: 1225   Evaluation  O2 sats: currently acceptable Complications: No apparent complications Patient did tolerate procedure well. Bilateral Breath Sounds: Rhonchi, Diminished   No  [name] 01/30/2023, 12:39 PM  Positive cuff leak pre extubation. Placed on 3 LPM post extubation.

## 2023-03-01 NOTE — Progress Notes (Signed)
NAME:  [name], MRN:  [id number], DOB:  1986/02/11, LOS: 12 ADMISSION DATE:  12/30/2022, CONSULTATION DATE:  4/28 REFERRING MD:  Dr. Eloise Harman , CHIEF COMPLAINT:  Weakness    History of Present Illness:  37 year old man presents to the ED with weakness found to have hemoglobin of 3.   He denies any hematemesis.  No hemoptysis.  No hematochezia.  Denies melena.  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  Last drink 2 days prior to admission.  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  He was hospitalized 10/2022 with GI bleed.  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  Also some portal hypertensive gastropathy.  At home he takes Lasix and spironolactone for lower extremity edema.  He also takes lactulose.   Upon arrival, he was tachycardic to the 170s and SVT.  He was cardioverted.  He remained normotensive.  He remains tachycardic but less so.  His T. bili seems at baseline.  LFTs seem at recent baseline as well.  Last month notable for acute renal failure, severe elevation in creatinine. Denied signs of GI bleeding. Initial PLTs 222, INR 2.5, scr 1.6 (baseline 0.5 range). Got blood (4 units PRBC) admitted to ICU. GI consulted. Placed on Octreotide + PPI infusion. GI recommended CT angio abdomen and pelvis with BRTO. Empiric CTX initiated.  Pertinent  Medical History  Alcoholic Cirrhosis with hx of Varices, Portal Hypertensive Gastropathy   Significant Hospital Events: Including procedures, antibiotic start and stop dates in addition to other pertinent events   4/25 Admit with weakness in setting of Hgb 3. GI consulted. Octreotide + PPI. Empiric CTX.  4/26 Increased WOB, gross hematuria w/clots.  CTA Abd/pelvis showed cirrhotic liver with borderline splenomegaly, large portosystemic venous collaterals, small gastric & esophageal varices, no ascites, cholelithiasis, small bilateral pleural effusions R>L. Hgb 5.3  overnight, 2 more units PRBC and lasix. Urology evaluation, no blood in foley after insertion.  Intubated.  MELDNa 33. IR consulted, not a candidate for TIPS b/c of advanced MELD score and vascular anatomy. No response to lasix. MRSA PCR neg.   4/27 Remains on vent with propofol/fentanyl, octreotide. Worsening AKI. EGD showed small esophageal varices, no evidence of active bleeding, OGT left in place.  4/28 Failed SBT. On fentanyl. Jaundice with worsening bili/INR.  Worsening creatinine, decreasing UOP. BP soft, Hgb <7, 1 unit PRBC given.  4/29 Failed SBT, intermittent vent dyssynchrony 4/30 started on CRRT 5/2 SVT episode overnight, resolved with 1 dose Versed. 5/3 Lower extremity duplex without DVT 5/5 Met with palliative care made DNR 5/6 Remains on CRRT sedated on fent, family at bedside    Interim History / Subjective:  CRRT discontinued after filter clotted. No clinical improvement seen. Remains volume overloaded despite 48 hours.  Early this am had bradycardia and persistent pauses. Precedex decreased. Dopamine added by elink  Episode of emesis. TF held  Objective   Blood pressure (!) 168/81, pulse (!) 117, temperature 98.1 F (36.7 C), temperature source Axillary, resp. rate (!) 22, height 5\' 9"  (1.753 m), weight 111.6 kg, SpO2 94 %.    Vent Mode: PCV FiO2 (%):  [40 %] 40 % Set Rate:  [20 bmp] 20 bmp PEEP:  [8 cmH20] 8 cmH20 Plateau Pressure:  [20 cmH20-24 cmH20] 24 cmH20   Intake/Output Summary (Last 24 hours) at 02/26/2023 0934 Last data filed at 02/10/2023 0811 Gross per 24 hour  Intake 1440.61 ml  Output 1217.3 ml  Net 223.31 ml   Filed Weights   02/01/23 0400 02/02/23 0300 02/03/23 0500  Weight: 116.3 kg 112.2 kg 111.6 kg   Physical Exam: General: Critically ill-appearing, no acute distress HENT: [place], AT, ETT in place Eyes: EOMI, no scleral icterus Respiratory: Clear to auscultation bilaterally.  No crackles, wheezing or rales Cardiovascular: RRR, -M/R/G, no  JVD GI: Distended, hypoactive BS+ Extremities:Anasarca,-tenderness Neuro: Sedated  Assessment & Plan:   Acute hepatic encephalopathy Acute respiratory failure 2/2 critical illness, aspiration pneumonia s/p antibiotics Acute blood loss anemia, hematuria recent varices on last EGD Decompensated alcoholic cirrhosis Acute alcoholic hepatitis EtOH abuse AKI 2/2 suspected ATN on CRRT - off CRRT now after time limited trial per Nephro. No clinical benefit Right ear stage 2 - not present on admission. Not pressure related. Suspected from edema GOC  Acute Hepatic / Metabolic Encephalopathy  P: Maintain neuro protective measures Continue lactulose and Xifaxan  Minimize sedation, PAD protocol  Nutrition and bowel regiment  Seizure precautions  Aspirations precautions  Continue Thiamine, folate, and MVI  Acute Hypoxic Respiratory Failure in setting of Pulmonary Edema, Possible MSSA Aspiration PNA vs Tracheobronchitis s/p course of CTX P: Full vent support LTVV, 4-8cc/kg IBW with goal Pplat<30 and DP<15 Head of bed elevated 30 degrees. Plateau pressures less than 30 cm H20.  Follow intermittent chest x-ray and ABG.   SAT/SBT as tolerated, extubation precluded by mental status Ensure adequate pulmonary hygiene  Follow cultures  VAP bundle in place  PAD protocol  Tachycardia, paroxysmal atrial for flutter -Suspicion for PE is low. No evidence of clot on lower extremity duplex P: Continuous telemetry   Acute Blood Loss Anemia  -In setting of hematuria, GIB. Largest contributor suspected to be GI given hx of cirrhosis, prior EGD findings and portal hypertensive gastropathy with varices. No hematemesis this admit.  Had significant hematuria with clots but no blood in foley on insertion - thought by Urology that he could have had a uretheral lesion that foley created tamponade. Varix on EGD but not banded. Hx recurrent gum bleeding per brother.  P: GI following, appreciate  assistance Follow CBC Transfuse per protocol  Hgb goal > 7 Continue BID PPI   Hematuria / Clots  -Interestingly, no blood in foley once placed, ? Urethral lesion  P: Appreicate urology assistance  Follow urine output   AKI -4/30-5/6 with no clinical improvement Hyponatremia - improved -UNa 45, more consistent with ATN due to shock/ischemic perfusion, hyperbilirubinemia.  HRS less likely (urine Na should be <10) P: Off CRRT Nephrology following, appreciate assistance  Follow renal function  Monitor urine output Trend Bmet Avoid nephrotoxins Ensure adequate renal perfusion   Decompensated Alcoholic Cirrhosis, acute ETOH hepatitis ETOH Abuse  -Nodular contour on RUQ [place], confirmed cirrhosis on CTA ABD.  Was sober for 8 months prior to admit, relapsed 2 days prior to admit and that was last drink. Maddrey's Discriminant Function Score 68 4/27. Worsening T. Bili/INR. Hepatitis panel negative.   -Steroids started 4/27 no improvement seen in INR so therapy stopped after 7 days  P: GI following MELD score remains elevated at 40 Bilirubin remains elevated at 15.9 Continue supportive care, prognosis remains poor    Ileus P: Tube feeds remains  NG tube remains to LIWS Continue bowel regiment   LLE Wound  -3cm x 2cm x 0.4cm LE lesion  P: Local wound care   Hyperglycemia - normalized P: Continue SSI  CBG goal 140-180 CBG checks q4   GOC DNR on 5/5 Last date of  multidisciplinary goals of care discussion: 5/6 with palliative care.   No longer receiving CRRT. Recommend one-way extubation. Palliative assisting with ongoing GOC  Best Practice (right click and "Reselect all SmartList Selections" daily)  Diet/type: NPO, holding due to ileus DVT prophylaxis: SCD. Defer chemical prophylaxis due to concern for bleed GI prophylaxis: PPI Lines: Dialysis Catheter Foley:  Yes, and it is still needed Code Status:  DNR  Critical care time:   The patient is critically ill with  multiple organ systems failure and requires high complexity decision making for assessment and support, frequent evaluation and titration of therapies, application of advanced monitoring technologies and extensive interpretation of multiple databases.  Independent Critical Care Time: 45 Minutes.   Mechele Collin, M.D. [facility] Pulmonary/Critical Care Medicine 02/16/2023 9:35 AM   Please see Amion for pager number to reach on-call Pulmonary and Critical Care Team.

## 2023-03-01 NOTE — Progress Notes (Addendum)
eLink Physician-Brief Progress Note Patient Name: [name] DOB: 07/23/1986 MRN: [id number]   Date of Service  02/21/2023  HPI/Events of Note  37 year old male that initially presented with weakness and anemia in liver failure  Currently on Precedex due to underlying encephalopathy.  Having a lot of pauses and bradycardia  eICU Interventions  Decrease Precedex, continue to monitor.  Will replete electrolytes as needed    0502 -  Persistent pauses, prolonged in nature, will add dopamine at low-dose.  Trend troponins.  May be worthwhile getting an echo but reassess in the morning. Had a bout of emesis when oral potassium was given.  Replacing IV instead.   Intervention Category Intermediate Interventions: Arrhythmia - evaluation and management  [name] 02/03/2023, 3:41 AM

## 2023-03-01 NOTE — Death Summary Note (Addendum)
DEATH SUMMARY   Patient Details  Name: [name] MRN: [id number] DOB: [birth year]  Admission/Discharge Information   Admit Date:  2023-02-19  Date of Death: Date of Death: Mar 03, 2023  Time of Death: Time of Death: 16-Mar-2018  Length of Stay: 2023-03-08  Referring Physician: Malka So., MD   Reason(s) for Hospitalization  Acute blood loss anemia  Diagnoses  Preliminary cause of death: Acute renal failure Secondary Diagnoses (including complications and co-morbidities): Alcoholic cirrhosis Principal Problem:   Anemia Active Problems:   Acute respiratory failure with hypoxia (HCC)   Gross hematuria   Decompensated hepatic cirrhosis (HCC)   SVT (supraventricular tachycardia)   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 Not pressure related. Suspected from edema Sepsis- present on admission  Brief Hospital Course (including significant findings, care, treatment, and services provided and events leading to death)  [name] is a 37 y.o. year old male with alcoholic cirrhosis with esophageal/duodenal varices, portal hypertensive gastropathy, hx alcohol use with recent relapse, recent upper GI bleed 10/2022 who presented with acute blood loss anemia and decompensated liver failure. He was treated with blood products, diuretics and life support including mechanical ventilation and dialysis. Unable to tolerate dialysis due to hemodynamic instability and no signs of renal recovery. CRRT discontinued in setting of co-morbidities. Family decision on 03/03/2023 with Palliative team to proceed with compassionate extubation and comfort  care. Patient passed on 03-Mar-2023 at 03/16/2018.  Pertinent Labs and Studies  Significant Diagnostic Studies DG CHEST PORT 1 VIEW  Result Date: 02/02/2023 CLINICAL DATA:  Follow-up.  Intubated in patient. EXAM: PORTABLE CHEST 1 VIEW COMPARISON:  01/31/2023. FINDINGS: Stable enlargement of the cardiac silhouette. New opacity at the left lung base obscures hemidiaphragm. Increased opacity at the right lung base. Hazy opacity noted in the right upper lung on the prior exam appears improved. No pneumothorax. Endotracheal tube, nasal/orogastric tube and right internal jugular central venous catheter are stable. IMPRESSION: 1. Improved airspace opacity in the right upper lung. Suspect improved asymmetric edema 2. Increased opacity at the lung bases suspected to be a combination of pleural effusions and atelectasis. 3. Stable support apparatus. Electronically Signed   By: Amie Portland M.D.   On: 02/02/2023 10:35   VAS [place] LOWER EXTREMITY VENOUS (DVT)  Result Date: 02/01/2023  Lower Venous DVT Study Patient Name:  [name]  Date of Exam:   01/31/2023 Medical Rec #: [id number]       Accession #:    [id number] Date of Birth: 09-Oct-1985        Patient Gender: M Patient Age:   85 years Exam Location:  [facility] Procedure:      VAS [place] LOWER EXTREMITY VENOUS (DVT) Referring Phys: Chilton Greathouse --------------------------------------------------------------------------------  Indications: Swelling.  Risk Factors: None identified. Limitations: Body habitus, poor ultrasound/tissue interface and patient immobility. Comparison Study: No prior studies. Performing Technologist: Chanda Busing RVT  Examination Guidelines: A complete evaluation includes B-mode imaging, spectral Doppler, color Doppler, and power Doppler as needed of all accessible portions of each vessel. Bilateral testing is considered an integral part of a complete examination. Limited examinations  for reoccurring indications may be performed as noted. The  reflux portion of the exam is performed with the patient in reverse Trendelenburg.  +---------+---------------+---------+-----------+----------+-------------------+ RIGHT    CompressibilityPhasicitySpontaneityPropertiesThrombus Aging      +---------+---------------+---------+-----------+----------+-------------------+ CFV      Full           Yes      No                                       +---------+---------------+---------+-----------+----------+-------------------+ SFJ      Full                                                             +---------+---------------+---------+-----------+----------+-------------------+ FV Prox  Full                                                             +---------+---------------+---------+-----------+----------+-------------------+ FV Mid                  Yes      No                                       +---------+---------------+---------+-----------+----------+-------------------+ FV Distal               Yes      No                                       +---------+---------------+---------+-----------+----------+-------------------+ PFV      Full                                                             +---------+---------------+---------+-----------+----------+-------------------+ POP      Full           Yes      No                                       +---------+---------------+---------+-----------+----------+-------------------+ PTV      Full                                                             +---------+---------------+---------+-----------+----------+-------------------+ PERO  Not well visualized +---------+---------------+---------+-----------+----------+-------------------+   +---------+---------------+---------+-----------+----------+--------------+ LEFT     CompressibilityPhasicitySpontaneityPropertiesThrombus Aging  +---------+---------------+---------+-----------+----------+--------------+ CFV      Full           Yes      No                                  +---------+---------------+---------+-----------+----------+--------------+ SFJ      Full                                                        +---------+---------------+---------+-----------+----------+--------------+ FV Prox  Full                                                        +---------+---------------+---------+-----------+----------+--------------+ FV Mid                  Yes      No                                  +---------+---------------+---------+-----------+----------+--------------+ FV Distal               Yes      No                                  +---------+---------------+---------+-----------+----------+--------------+ PFV      Full                                                        +---------+---------------+---------+-----------+----------+--------------+ POP      Full           Yes      No                                  +---------+---------------+---------+-----------+----------+--------------+ PTV      Full                                                        +---------+---------------+---------+-----------+----------+--------------+ PERO     Full                                                        +---------+---------------+---------+-----------+----------+--------------+     Summary: RIGHT: - There is no evidence of deep vein thrombosis in the lower extremity. However, portions of this examination were limited- see technologist comments above.  - No cystic structure found in the popliteal fossa.  LEFT: - There is no evidence of deep vein thrombosis in the lower extremity. However, portions of this examination were limited- see technologist comments above.  - No cystic structure found in the popliteal fossa.  *See table(s) above for measurements and observations.  Electronically signed by Coral Else MD on 02/01/2023 at 3:00:01 PM.    Final    DG Abd 1 View  Result Date: 02/01/2023 CLINICAL DATA:  Follow-up ileus. EXAM: ABDOMEN - 1 VIEW COMPARISON:  01/31/2023 and 01/30/2023.  CT, 01/17/2023. FINDINGS: Mildly distended air-filled colon. Several loops of prominent air-filled small bowel also noted. Appearance is similar to the previous day's exam. Stable nasal/orogastric tube, tip well within the stomach. IMPRESSION: 1. No significant change from the previous day's study. 2. Persistent mild gaseous distension of small bowel and colon consistent with adynamic ileus. Electronically Signed   By: Amie Portland M.D.   On: 02/01/2023 10:07   DG Abd 1 View  Result Date: 01/31/2023 CLINICAL DATA:  Abnormal respiration. EXAM: ABDOMEN - 1 VIEW COMPARISON:  Jan 30, 2023. FINDINGS: Stable position nasogastric tube in proximal stomach. Stable large and small bowel dilatation is noted most consistent with ileus. IMPRESSION: Stable bowel dilatation most consistent with ileus. Electronically Signed   By: Lupita Raider M.D.   On: 01/31/2023 08:51   DG CHEST PORT 1 VIEW  Result Date: 01/31/2023 CLINICAL DATA:  Abnormal respiration. EXAM: PORTABLE CHEST 1 VIEW COMPARISON:  Jan 29, 2023. FINDINGS: Stable cardiomegaly. Endotracheal and nasogastric tubes are unchanged in position. Right internal jugular catheter is unchanged. Bilateral lung opacities are again noted concerning for pulmonary edema or possibly multifocal pneumonia, right greater than left. Small right pleural effusion is noted. Elevated left hemidiaphragm is noted. Bony thorax is unremarkable. IMPRESSION: Stable support apparatus. Continued bilateral lung opacities are noted concerning for pulmonary edema or possibly multifocal pneumonia, right greater than left. Small right pleural effusion. Electronically Signed   By: Lupita Raider M.D.   On: 01/31/2023 08:50   DG Abd 1 View  Result Date: 01/30/2023 CLINICAL DATA:   [id number] Ileus (HCC) [id number] EXAM: ABDOMEN - 1 VIEW COMPARISON:  Radiograph 01/29/2023 FINDINGS: Nasogastric tube tip and side port overlie the stomach. There is diffuse gaseous distention of small and large bowel, not significantly changed from prior. IMPRESSION: No significant change in diffuse gaseous dilation of small and large bowel, compatible with ileus. Nasogastric tube tip and side port overlie the stomach. Electronically Signed   By: Caprice Renshaw M.D.   On: 01/30/2023 09:30   DG Abd 1 View  Result Date: 01/29/2023 CLINICAL DATA:  NG tube placement. EXAM: ABDOMEN - 1 VIEW COMPARISON:  01/29/2023 at 0703 hours. FINDINGS: 0954 hours. Enteric tube tip and side port project over the stomach. Unchanged gaseous distention of the bowel. IMPRESSION: Enteric tube tip and side port project over the stomach. Unchanged gaseous distention of the bowel. Electronically Signed   By: Orvan Falconer M.D.   On: 01/29/2023 10:06   DG CHEST PORT 1 VIEW  Result Date: 01/29/2023 CLINICAL DATA:  Provided history: Acute respiratory failure with hypoxia. EXAM: PORTABLE CHEST 1 VIEW COMPARISON:  Prior chest radiographs 01/28/2023 and earlier. FINDINGS: Right IJ approach catheter with tip at the level of the mid to lower SVC. ET tube present with tip at the superior margin of the clavicular heads. An enteric tube is not well delineated on the level of the distal esophagus. Cardiomegaly. Diffuse patchy bilateral airspace opacities, similar to the prior examination of 01/28/2023. A  small left pleural effusion may be present. No evidence of pneumothorax. No acute osseous abnormality identified. Impression #1 will be called to the ordering clinician or representative by the Radiologist Assistant, and communication documented in the PACS or [employer]. IMPRESSION: 1. Support apparatus as described. Notably, the enteric tube is not well delineated beyond the level of the distal esophagus. Consider abdominal radiographs to better  delineate tube position. Also of note, the ET tube terminates at the level of the superior margin of the clavicular heads. Consider advancement. 2. Diffuse patchy bilateral airspace opacities, similar to the prior examination of 01/28/2023. Findings likely reflect pulmonary edema. However, pneumonia cannot be excluded and clinical correlation is recommended. 3. Possible small left pleural effusion. 4. Cardiomegaly. Electronically Signed   By: Jackey Loge D.O.   On: 01/29/2023 08:39   DG Abd 1 View  Result Date: 01/29/2023 CLINICAL DATA:  Abdominal distention EXAM: ABDOMEN - 1 VIEW COMPARISON:  Abdominal radiograph dated 2023/02/18 FINDINGS: NG/OG tube tip and side port are in the stomach. Dilated gas-filled loops of small and large bowel, increased central when compared with the prior. No acute osseous abnormality. IMPRESSION: 1. NG/OG tube tip and side port are in the stomach. 2. Dilated gas-filled loops of small and large bowel with increased distention when compared with the prior, findings may be due to ileus or partial/early obstruction. Electronically Signed   By: Allegra Lai M.D.   On: 01/29/2023 08:15   DG Chest 1 View  Result Date: 01/28/2023 CLINICAL DATA:  Central line placement. EXAM: CHEST  1 VIEW COMPARISON:  January 27, 2023. FINDINGS: Stable cardiomegaly with central pulmonary vascular congestion. Bilateral lung opacities are noted concerning for pulmonary edema. Endotracheal and nasogastric tubes are in grossly good position. Right internal jugular catheter is noted with tip in expected position of the SVC. Bony thorax is unremarkable. IMPRESSION: Stable cardiomegaly with bilateral lung opacities most consistent with edema. Right internal jugular catheter is noted with tip in expected position of SVC. Electronically Signed   By: Lupita Raider M.D.   On: 01/28/2023 14:24   DG CHEST PORT 1 VIEW  Result Date: 01/27/2023 CLINICAL DATA:  Hypoxia. EXAM: PORTABLE CHEST 1 VIEW  COMPARISON:  01/26/2023. FINDINGS: 1419 hours. Endotracheal tube terminates in the midthoracic trachea. Enteric tube side port projects over the stomach. Stable cardiomegaly and mediastinal contours. Slightly improved pulmonary edema with stable small bilateral pleural effusions and adjacent bibasilar atelectasis. No pneumothorax. IMPRESSION: 1. Slightly improved pulmonary edema with stable small bilateral pleural effusions. 2. Stable support apparatus. Electronically Signed   By: Orvan Falconer M.D.   On: 01/27/2023 14:30   DG CHEST PORT 1 VIEW  Result Date: 01/26/2023 CLINICAL DATA:  Endotracheal tube present.  Abnormal chest x-ray. EXAM: PORTABLE CHEST 1 VIEW COMPARISON:  Radiographs Feb 18, 2023 and earlier. Chest CTA 10/26/2022. FINDINGS: 0543 hours. Lordotic positioning. The endotracheal and enteric tubes appear unchanged, although the tip of the lateral remains nonvisualized. The heart is enlarged. There are persistent perihilar opacities bilaterally which are most consistent with pulmonary edema. There are bibasilar pulmonary opacities, likely due to a combination of pleural effusions and bibasilar atelectasis. No pneumothorax or acute osseous abnormality. Telemetry leads overlie the chest. IMPRESSION: No significant change from prior study. Persistent pulmonary edema, bibasilar atelectasis and pleural effusions. Stable support system. Electronically Signed   By: Carey Bullocks M.D.   On: 01/26/2023 11:25   DG Abd 1 View  Result Date: 18-Feb-2023 CLINICAL DATA:  [id number] Encounter for orogastric (  OG) tube placement [id number] EXAM: ABDOMEN - 1 VIEW COMPARISON:  Chest film from earlier the same day FINDINGS: Gastric tube extends to the gastric antrum. Stomach is incompletely distended. A few gas distended small and large bowel loops are noted in the visualized upper abdomen. The lower abdomen is excluded. Opacities are seen in both lung bases as before. Stable cardiomegaly. IMPRESSION: Gastric tube to the  gastric antrum. Electronically Signed   By: Corlis Leak M.D.   On: 01/22/2023 15:20   US Abdomen Limited RUQ (LIVER/GB)  Result Date: 01/16/2023 CLINICAL DATA:  Cirrhosis EXAM: ULTRASOUND ABDOMEN LIMITED RIGHT UPPER QUADRANT COMPARISON:  None Available. FINDINGS: Gallbladder: Probable layering stones in the gallbladder. Sludge. No Murphy's sign. Common bile duct: Diameter: 4 mm Liver: Coarsened heterogeneous increased echogenicity. No mass noted. Portal vein is patent. Reversal of blood flow identified. Other: None. IMPRESSION: 1. Cirrhotic liver. No mass identified. 2. Reversal of blood flow in the portal vein consistent with portal hypertension. 3. Probable layering stones in the gallbladder. Sludge. No Murphy's sign. Electronically Signed   By: Gerome Sam III M.D.   On: 01/19/2023 14:59   DG Chest Port 1 View  Result Date: 01/03/2023 CLINICAL DATA:  Pulmonary edema. EXAM: PORTABLE CHEST 1 VIEW COMPARISON:  01/24/2023 FINDINGS: Endotracheal tube is 5.0 cm above the carina. Nasogastric tube extends into the abdomen. Not clear if the tip is beyond the image. Stable enlargement of the cardiac silhouette. Persistent perihilar densities are suggestive for edema. Hazy densities at lung bases could represent atelectasis and/or pleural fluid. Negative for a pneumothorax. IMPRESSION: 1. Cardiomegaly with perihilar densities and findings are suggestive for pulmonary edema. Chest radiograph findings have minimally changed. 2. Hazy densities at the lung bases could represent atelectasis and/or pleural fluid. 3. Support apparatuses as described. Electronically Signed   By: Richarda Overlie M.D.   On: 01/18/2023 08:37   ECHOCARDIOGRAM COMPLETE  Result Date: 01/24/2023    ECHOCARDIOGRAM REPORT   Patient Name:   KEIL CORAZZA Date of Exam: 01/24/2023 Medical Rec #:  [id number]      Height:       69.0 in Accession #:    [id number]     Weight:       220.0 lb Date of Birth:  1986-07-05       BSA:          2.151 m Patient  Age:    37 years       BP:           145/62 mmHg Patient Gender: M              HR:           99 bpm. Exam Location:  Inpatient Procedure: 2D Echo, Color Doppler and Cardiac Doppler Indications:    CHF  History:        Patient has prior history of Echocardiogram examinations, most                 recent 01/12/2022. Risk Factors:Hypertension.  Sonographer:    Milbert Coulter Referring Phys: [phone] [facility]  Sonographer Comments: Echo performed with patient supine and on artificial respirator. IMPRESSIONS  1. Left ventricular ejection fraction, by estimation, is 50 to 55%. The left ventricle has low normal function. The left ventricle has no regional wall motion abnormalities. There is mild left ventricular hypertrophy. Left ventricular diastolic parameters are indeterminate.  2. Right ventricular systolic function is normal. The right ventricular size is moderately enlarged. There is mildly elevated  pulmonary artery systolic pressure.  3. Right atrial size was mildly dilated.  4. No evidence of mitral valve regurgitation.  5. Tricuspid valve regurgitation is mild to moderate.  6. The aortic valve was not well visualized. Aortic valve regurgitation is not visualized.  7. Aortic not well visualized.  8. The inferior vena cava is dilated in size with <50% respiratory variability, suggesting right atrial pressure of 15 mmHg. FINDINGS  Left Ventricle: Left ventricular ejection fraction, by estimation, is 50 to 55%. The left ventricle has low normal function. The left ventricle has no regional wall motion abnormalities. The left ventricular internal cavity size was normal in size. There is mild left ventricular hypertrophy. Left ventricular diastolic parameters are indeterminate. Right Ventricle: The right ventricular size is moderately enlarged. Right ventricular systolic function is normal. There is mildly elevated pulmonary artery systolic pressure. The tricuspid regurgitant velocity is 2.69 m/s, and with an assumed  right atrial pressure of 15 mmHg, the estimated right ventricular systolic pressure is 43.9 mmHg. Left Atrium: Left atrial size was normal in size. Right Atrium: Right atrial size was mildly dilated. Pericardium: There is no evidence of pericardial effusion. Mitral Valve: No evidence of mitral valve regurgitation. Tricuspid Valve: Tricuspid valve regurgitation is mild to moderate. Aortic Valve: The aortic valve was not well visualized. Aortic valve regurgitation is not visualized. Pulmonic Valve: Pulmonic valve regurgitation is not visualized. Aorta: Not well visualized. Venous: The inferior vena cava is dilated in size with less than 50% respiratory variability, suggesting right atrial pressure of 15 mmHg. IAS/Shunts: The interatrial septum was not well visualized.  LEFT VENTRICLE PLAX 2D LVIDd:         5.80 cm      Diastology LVIDs:         4.50 cm      LV e' medial:    9.79 cm/s LV PW:         1.10 cm      LV E/e' medial:  12.6 LV IVS:        1.10 cm      LV e' lateral:   11.70 cm/s LVOT diam:     2.10 cm      LV E/e' lateral: 10.5 LVOT Area:     3.46 cm  LV Volumes (MOD) LV vol d, MOD A2C: 123.0 ml LV vol d, MOD A4C: 134.0 ml LV vol s, MOD A2C: 61.3 ml LV vol s, MOD A4C: 75.1 ml LV SV MOD A2C:     61.7 ml LV SV MOD A4C:     134.0 ml LV SV MOD BP:      60.2 ml RIGHT VENTRICLE RV Basal diam:  6.60 cm RV Mid diam:    5.60 cm RV S prime:     12.20 cm/s TAPSE (M-mode): 2.5 cm LEFT ATRIUM             Index        RIGHT ATRIUM           Index LA diam:        4.10 cm 1.91 cm/m   RA Area:     25.30 cm LA Vol (A2C):   53.6 ml 24.91 ml/m  RA Volume:   91.40 ml  42.48 ml/m LA Vol (A4C):   54.3 ml 25.24 ml/m LA Biplane Vol: 56.5 ml 26.26 ml/m  MITRAL VALVE                TRICUSPID VALVE MV Area (PHT): 3.42 cm  TR Peak grad:   28.9 mmHg MV Decel Time: 222 msec     TR Vmax:        269.00 cm/s MV E velocity: 123.00 cm/s MV A velocity: 135.00 cm/s  SHUNTS MV E/A ratio:  0.91         Systemic Diam: 2.10 cm Carolan Clines  Electronically signed by Carolan Clines Signature Date/Time: 01/24/2023/10:37:05 AM    Final    US RENAL  Result Date: 01/24/2023 CLINICAL DATA:  Hematuria EXAM: RENAL / URINARY TRACT ULTRASOUND COMPLETE COMPARISON:  CT 10/26/2022 FINDINGS: Right Kidney: Renal measurements: 12.1 x 6.5 x 6.8 cm = volume: 281 mL. Normal cortical thickness and echogenicity. Suboptimally visualized due to shadowing related to body habitus. No gross evidence of mass or hydronephrosis. Left Kidney: Renal measurements: 11.9 x 6.9 x 6.5 cm = volume: 281 mL. Normal cortical thickness and echogenicity. Suboptimally visualized due to shadowing from body habitus. No mass or hydronephrosis. Bladder: Decompressed by Foley catheter, inadequately assessed Other: No gross shadowing calculi are seen at either kidney though assessment is limited. IMPRESSION: No gross evidence of renal mass or hydronephrosis. Electronically Signed   By: Ulyses Southward M.D.   On: 01/24/2023 10:03   DG CHEST PORT 1 VIEW  Result Date: 01/24/2023 CLINICAL DATA:  Intubation EXAM: PORTABLE CHEST 1 VIEW COMPARISON:  Portable exam 0759 hours compared to 01/24/2023 at 0731 hours FINDINGS: Tip of endotracheal tube now projects 5.8 cm above carina. Nasogastric tube extends into stomach. Enlargement of cardiac silhouette with pulmonary vascular congestion. Perihilar infiltrates favoring pulmonary edema. Small bibasilar effusions. No pneumothorax. IMPRESSION: CHF with small bibasilar effusions. Tip of endotracheal tube now projects 5.8 cm above carina. Electronically Signed   By: Ulyses Southward M.D.   On: 01/24/2023 08:24   DG CHEST PORT 1 VIEW  Result Date: 01/24/2023 CLINICAL DATA:  Status post intubation. EXAM: PORTABLE CHEST 1 VIEW COMPARISON:  Earlier today FINDINGS: The ET tube tip is approximately 1.5 cm above the carina directed towards the right mainstem bronchus. Cardiac enlargement. Bilateral pleural effusions and interstitial edema appears similar to the previous  exam. Visualized osseous structures are unremarkable. IMPRESSION: The ET tube tip is approximately 1.5 cm above the carina directed towards the right mainstem bronchus. Recommend retracting approximately 1-2 cm. No change in CHF pattern. These results will be called to the ordering clinician or representative by the Radiologist Assistant, and communication documented in the PACS or [employer]. Electronically Signed   By: Signa Kell M.D.   On: 01/24/2023 07:43   DG CHEST PORT 1 VIEW  Result Date: 01/24/2023 CLINICAL DATA:  Respiratory distress EXAM: PORTABLE CHEST 1 VIEW COMPARISON:  01/20/2023 FINDINGS: Cardiomegaly with vascular congestion. Bilateral airspace opacities most compatible with moderate edema. Probable layering bilateral effusions. No acute bony abnormality. IMPRESSION: Moderate CHF Electronically Signed   By: Charlett Nose M.D.   On: 01/24/2023 01:40   CT ANGIO ABD/PELVIS BRTO  Result Date: 01/11/2023 CLINICAL DATA:  Gastric varices severe anemia, gastric and duodenal varices, may need embolization EXAM: CTA ABDOMEN AND PELVIS WITHOUT AND WITH CONTRAST TECHNIQUE: Multidetector CT imaging of the abdomen and pelvis was performed using the standard protocol during bolus administration of intravenous contrast. Multiplanar reconstructed images and MIPs were obtained and reviewed to evaluate the vascular anatomy. RADIATION DOSE REDUCTION: This exam was performed according to the departmental dose-optimization program which includes automated exposure control, adjustment of the mA and/or kV according to patient size and/or use of iterative reconstruction technique. CONTRAST:   OMNIPAQUE IOHEXOL 350 MG/ML SOLN COMPARISON:  10/26/2022 FINDINGS: VASCULAR Aorta: Normal caliber aorta without aneurysm, dissection, vasculitis or significant stenosis. Celiac: Patent without evidence of aneurysm, dissection, vasculitis or significant stenosis. SMA: Patent without evidence of aneurysm, dissection,  vasculitis or significant stenosis. Renals: Both renal arteries are patent without evidence of aneurysm, dissection, vasculitis, fibromuscular dysplasia or significant stenosis. IMA: Patent without evidence of aneurysm, dissection, vasculitis or significant stenosis. Inflow: Patent without evidence of aneurysm, dissection, vasculitis or significant stenosis. Proximal Outflow: Bilateral common femoral and visualized portions of the superficial and profunda femoral arteries are patent without evidence of aneurysm, dissection, vasculitis or significant stenosis. Veins: Patent hepatic veins, portal vein, SM V. There is a large venous collateral from the SMV to the left retroperitoneum ultimately draining into the left renal vein. There is a large tortuous left splenorenal shunt, with small gastric varices. Mild esophageal varices near the GE junction. Iliac venous system patent. 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 Review of the MIP images confirms the above findings. NON-VASCULAR Lower chest: Small pleural effusions right greater than left. Dependent atelectasis/consolidation in the lower lobes worse right than left. Hepatobiliary: Short-term liver with nodular contour. No mass or biliary ductal dilatation. Gallbladder physiologically distended with multiple small partially calcified layering stones in its dependent aspect. Pancreas: Unremarkable. No pancreatic ductal dilatation or surrounding inflammatory changes. Spleen: Borderline splenomegaly 12.4 cm maximum length. No focal lesion. Patent splenic vein with large left retroperitoneal splenorenal shunt. Adrenals/Urinary Tract: No adrenal mass. Symmetric renal parenchymal enhancement. No urolithiasis or hydronephrosis. Urinary bladder incompletely distended. Stomach/Bowel: Stomach is partially distended, unremarkable. Small bowel decompressed. The colon is incompletely distended by gas and fecal material, unremarkable. Lymphatic: No adenopathy.  Reproductive: Prostate is unremarkable. Other: Left pelvic phleboliths.  No ascites.  No free air. Musculoskeletal: Body wall anasarca.  Regional bones unremarkable. IMPRESSION: 1. Cirrhotic liver with borderline splenomegaly, large portosystemic venous collaterals, small gastric and esophageal varices. 2. No ascites. 3. Cholelithiasis. 4. Small bilateral pleural effusions right greater than left. Electronically Signed   By: Corlis Leak M.D.   On: 01/16/2023 12:50   DG Chest Port 1 View  Result Date: 01/03/2023 CLINICAL DATA:  Shortness of breath and tachycardia. EXAM: PORTABLE CHEST 1 VIEW COMPARISON:  01/12/2022 FINDINGS: The heart is enlarged but stable. External pacer paddles are noted. There is moderate vascular congestion and probable mild interstitial edema. Small bilateral pleural effusions are suspected. No infiltrates or pneumothorax. IMPRESSION: Cardiac enlargement, vascular congestion and probable mild interstitial edema. Suspect small bilateral pleural effusions. Electronically Signed   By: Rudie Meyer M.D.   On: 01/20/2023 09:50    Microbiology Recent Results (from the past 240 hour(s))  Culture, Respiratory w Gram Stain     Status: None   Collection Time: 01/31/23  6:15 AM   Specimen: Tracheal Aspirate; Respiratory  Result Value Ref Range Status   Specimen Description   Final    TRACHEAL ASPIRATE Performed at [facility], [address] [place], [place] [address]    Special Requests   Final    NONE Performed at [facility], [address], [place], [place] [address]    Gram Stain   Final    ABUNDANT WBC PRESENT, PREDOMINANTLY PMN RARE GRAM NEGATIVE RODS Performed at [facility], [address], [place], [place] [address]    Culture FEW STAPHYLOCOCCUS AUREUS  Final   Report Status 02/03/2023 FINAL  Final   Organism ID, Bacteria STAPHYLOCOCCUS AUREUS  Final  Susceptibility   Staphylococcus aureus - MIC*    CIPROFLOXACIN  <=0.5 SENSITIVE Sensitive     ERYTHROMYCIN <=0.25 SENSITIVE Sensitive     GENTAMICIN <=0.5 SENSITIVE Sensitive     OXACILLIN <=0.25 SENSITIVE Sensitive     TETRACYCLINE <=1 SENSITIVE Sensitive     VANCOMYCIN <=0.5 SENSITIVE Sensitive     TRIMETH/SULFA <=10 SENSITIVE Sensitive     CLINDAMYCIN <=0.25 SENSITIVE Sensitive     RIFAMPIN <=0.5 SENSITIVE Sensitive     Inducible Clindamycin NEGATIVE Sensitive     LINEZOLID 2 SENSITIVE Sensitive     * FEW STAPHYLOCOCCUS AUREUS    Lab Basic Metabolic Panel: Recent Labs  Lab 01/31/23 0258 01/31/23 1531 02/01/23 0334 02/01/23 1614 02/02/23 0242 02/02/23 1529 02/03/23 0249 03/02/23 0319  NA 132*   < > 132* 130* 133*  133* 135 133* 133*  K 5.0   < > 3.8 3.9 3.7  3.7 3.4* 3.6 3.8  CL 100   < > 98 98 101  100 102 100 101  CO2 23   < > 23 23 23  23 22 22 23   GLUCOSE 135*   < > 107* 131* 112*  114* 118* 99 113*  BUN 46*   < > 43* 42* 39*  40* 39* 35* 44*  CREATININE 2.60*   < > 2.64* 2.35* 2.37*  2.30* 1.97* 1.93* 1.94*  CALCIUM 8.6*   < > 8.8* 8.6* 8.5*  8.6* 8.8* 8.7* 9.4  MG 2.9*  --  2.8*  --  3.0*  --  3.1* 3.0*  PHOS 4.5   < >  --  3.9 2.9 2.3* 2.0* 3.0   < > = values in this interval not displayed.   Liver Function Tests: Recent Labs  Lab 01/30/23 0241 01/30/23 1557 01/31/23 0258 01/31/23 1531 02/01/23 0334 02/01/23 1614 02/02/23 0242 02/02/23 1529 02/03/23 0249  AST 40  --  52*  --  44*  --  43*  --   --   ALT 16  --  21  --  22  --  24  --   --   ALKPHOS 66  --  67  --  48  --  49  --   --   BILITOT 14.7*  --  14.4*  --  15.0*  --  15.9*  --   --   PROT 9.4*  --  9.1*  --  8.5*  --  7.9  --   --   ALBUMIN 3.4*   < > 3.2*  3.2*   < > 3.5 3.1* 2.9*  2.9* 3.3* 3.6   < > = values in this interval not displayed.   No results for input(s): "LIPASE", "AMYLASE" in the last 168 hours. No results for input(s): "AMMONIA" in the last 168 hours. CBC: Recent Labs  Lab 01/30/23 0241 01/31/23 0258 02/01/23 0249  02/02/23 0242 03/02/23 0319  WBC 7.1 8.3 6.4 6.0 8.0  HGB 8.2* 8.7* 8.9* 9.4* 8.7*  HCT 27.5* 29.7* 29.3* 30.0* 28.1*  MCV 90.2 91.1 87.7 85.0 85.4  PLT 143* 123* 108* 86* 58*   Cardiac Enzymes: No results for input(s): "CKTOTAL", "CKMB", "CKMBINDEX", "TROPONINI" in the last 168 hours. Sepsis Labs: Recent Labs  Lab 01/31/23 0258 02/01/23 0249 02/02/23 0242 2023-03-02 0319  WBC 8.3 6.4 6.0 8.0     Naftoli Penny Mechele Collin 02/05/2023, 4:16 PM

## 2023-03-01 NOTE — Progress Notes (Addendum)
Daily Progress Note   Patient Name: [name]       Date: 2023-02-16 DOB: 08/27/1986  Age: 37 y.o. MRN#: [id number] Attending Physician: Joshua Cutter, MD Primary Care Physician: Joshua So., MD Admit Date: 12/31/2022 Length of Stay: 12 days  Reason for Consultation/Follow-up: Establishing goals of care and Psychosocial/spiritual support  Subjective:   CC: Patient remains intubated though spontaneous movements at times.  Spoke with parents at bedside.  Following up regarding complex medical decision making.  Subjective:  Reviewed EMR prior to seeing patient.  Overnight patient's Precedex was discontinued due to cardiac pauses.  Patient was started on dopamine due to these cardiac pauses as well.  This morning dopamine has been discontinued.  Patient remains on fentanyl IV for management of pain associated with intubation.  CRRT was appropriately stopped on 02/03/2023 after time-limited trial did not show improvement in patient's overall medical status. Discussed care with bedside RN and PCCM physician prior to visiting with family at bedside.  Presented to bedside and introduced myself to family as member of the palliative medicine team.  Family present at bedside was patient's father and mother.  Patient remains intubated though no longer on sedation.  Spontaneous movements noted at times. Family able to update me regarding medical care at this time.  With permission, able to discuss one-way extubate him today.  Discussed patient is as medically optimized as he can be after receiving CRRT for the past days.  Patient more awake at this time.  This would be the opportunity for a one-way extubation to potentially allow patient anytime to communicate with family as this is likely as good as his mentation can be.  Parents acknowledged this.  Inquired if other family needs to be present for palliative extubation.  They noted patient's brother would likely not want to be here at this time.   While this provider was present in the room, they called patient's significant other [name] who noted she would be here in 15 to 20 minutes so would plan to perform one-way extubation when she was here.  Did discuss that after one-way extubation, will NOT reintubate.  Noted patient's body is already leading the way.  Care team will focus on aggressive symptom management moving forward such as if patient shows signs of pain, dyspnea, or agitation.  Noted now is time for the patient's family to live on him and spend time with him and say anything they need to say to him.  Acknowledged that time could be short though ultimately God has the final say in this.  Time providing emotional support via active listening.  Updated care team with plan for one-way extubation today and focusing on patient's comfort moving forward.  Review of Systems Unable to obtain due to patient's medical status Objective:   Vital Signs:  BP (!) 168/81 (BP Location: Left Arm)   Pulse (!) 117   Temp 98.1 F (36.7 C) (Axillary)   Resp (!) 22   Ht 5\' 9"  (1.753 m)   Wt 111.6 kg   SpO2 94%   BMI 36.33 kg/m   Physical Exam: General: intubated, lethargic, small spontaneous movements in times in UEs Eyes: scleral icterus HENT: ET in place Cardiovascular: Tachycardia noted Respiratory: On vent support Abdomen: distended Extremities: Anasarca noted Neuro: Lethargic, spontaneous movements in upper extremities/not to command  Imaging:  I personally reviewed recent imaging.   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  Patient's hospital course has been complicated by MSSA tracheobronchitis versus possible aspiration pneumonia, ongoing acute respiratory failure requiring ventilatory support, acute renal failure, and acute on chronic total  body volume overload.  Palliative medicine team was consulted to assist with complex medical decision making and psychosocial support.  Recommendations/Plan: # Complex medical decision making/goals of care:  - Unable to participate in complex medical decision-making due to his underlying medical status.  -Discussed care with patient's parents as described above in HPI.  Patient's parents are his next of kin/decision makers since patient is not legally married.  Planning for one-way extubation today; will NOT reintubate.  Will focus on symptom management moving forward.  Parents reached out to patient's significant other who is planning to be here palliative extubation.  -  Code Status: DNR Prognosis: hours- days  # Symptom management:  -Pain/Dyspnea   -Patient currently receiving IV fentanyl infusion with boluses available.  Continue to adjust as needed based on patient's symptom burden after one-way extubation.  # Psychosocial Support:  -Parents, significant other   # Discharge Planning: Planning for one-way extubation today.  Will focus on patient's symptom management moving forward.  Will continue to monitor for appropriate setting to continue symptom management in setting of patient's serious medical illness.  Discussed with: RN, PCCM Attending, Patient's Parents at Bedside  Thank you for allowing the palliative care team to participate in the care [facility].  Alvester Morin, DO Palliative Care Provider PMT # [phone]  If patient remains symptomatic despite maximum doses, please call PMT at [phone] between 0700 and 1900. Outside of these hours, please call attending, as PMT does not have night coverage.  UPDATE: Informed by RN later in day about patient's anxiety and increased RR. Patient suffering from bloody secretions. Has not been responsive to interactions with family. Discussed and going to start propofol from comfort at this time as comfort is the primary goal.    *Please note that this is a verbal dictation therefore any spelling or grammatical errors are due to the "[employer]" system interpretation.

## 2023-03-01 NOTE — Progress Notes (Signed)
eLink Physician-Brief Progress Note Patient Name: [name] DOB: 11-06-1985 MRN: [id number]   Date of Service  2023/02/22  HPI/Events of Note  37 year old male with weakness in the setting of multiorgan dysfunction and liver dysfunction.  He has acute encephalopathy, respiratory failure, decompensated cirrhosis, acute kidney injury, and acute anemia secondary to bleeds.  eICU Interventions  Patient was made comfort care and eventually passed away at 03/08/2018.     Intervention Category Evaluation Type: Other  [name] 02/22/2023, 8:42 PM

## 2023-03-01 DEATH — deceased

## 2023-09-23 IMAGING — DX DG CHEST 2V
2 series · 2 of 2 positions shown · non-contrast
Comparison: No priors.

CLINICAL DATA: 36-year-old male with history of shortness of
breath.

EXAM:
CHEST - 2 VIEW

[chest pa]
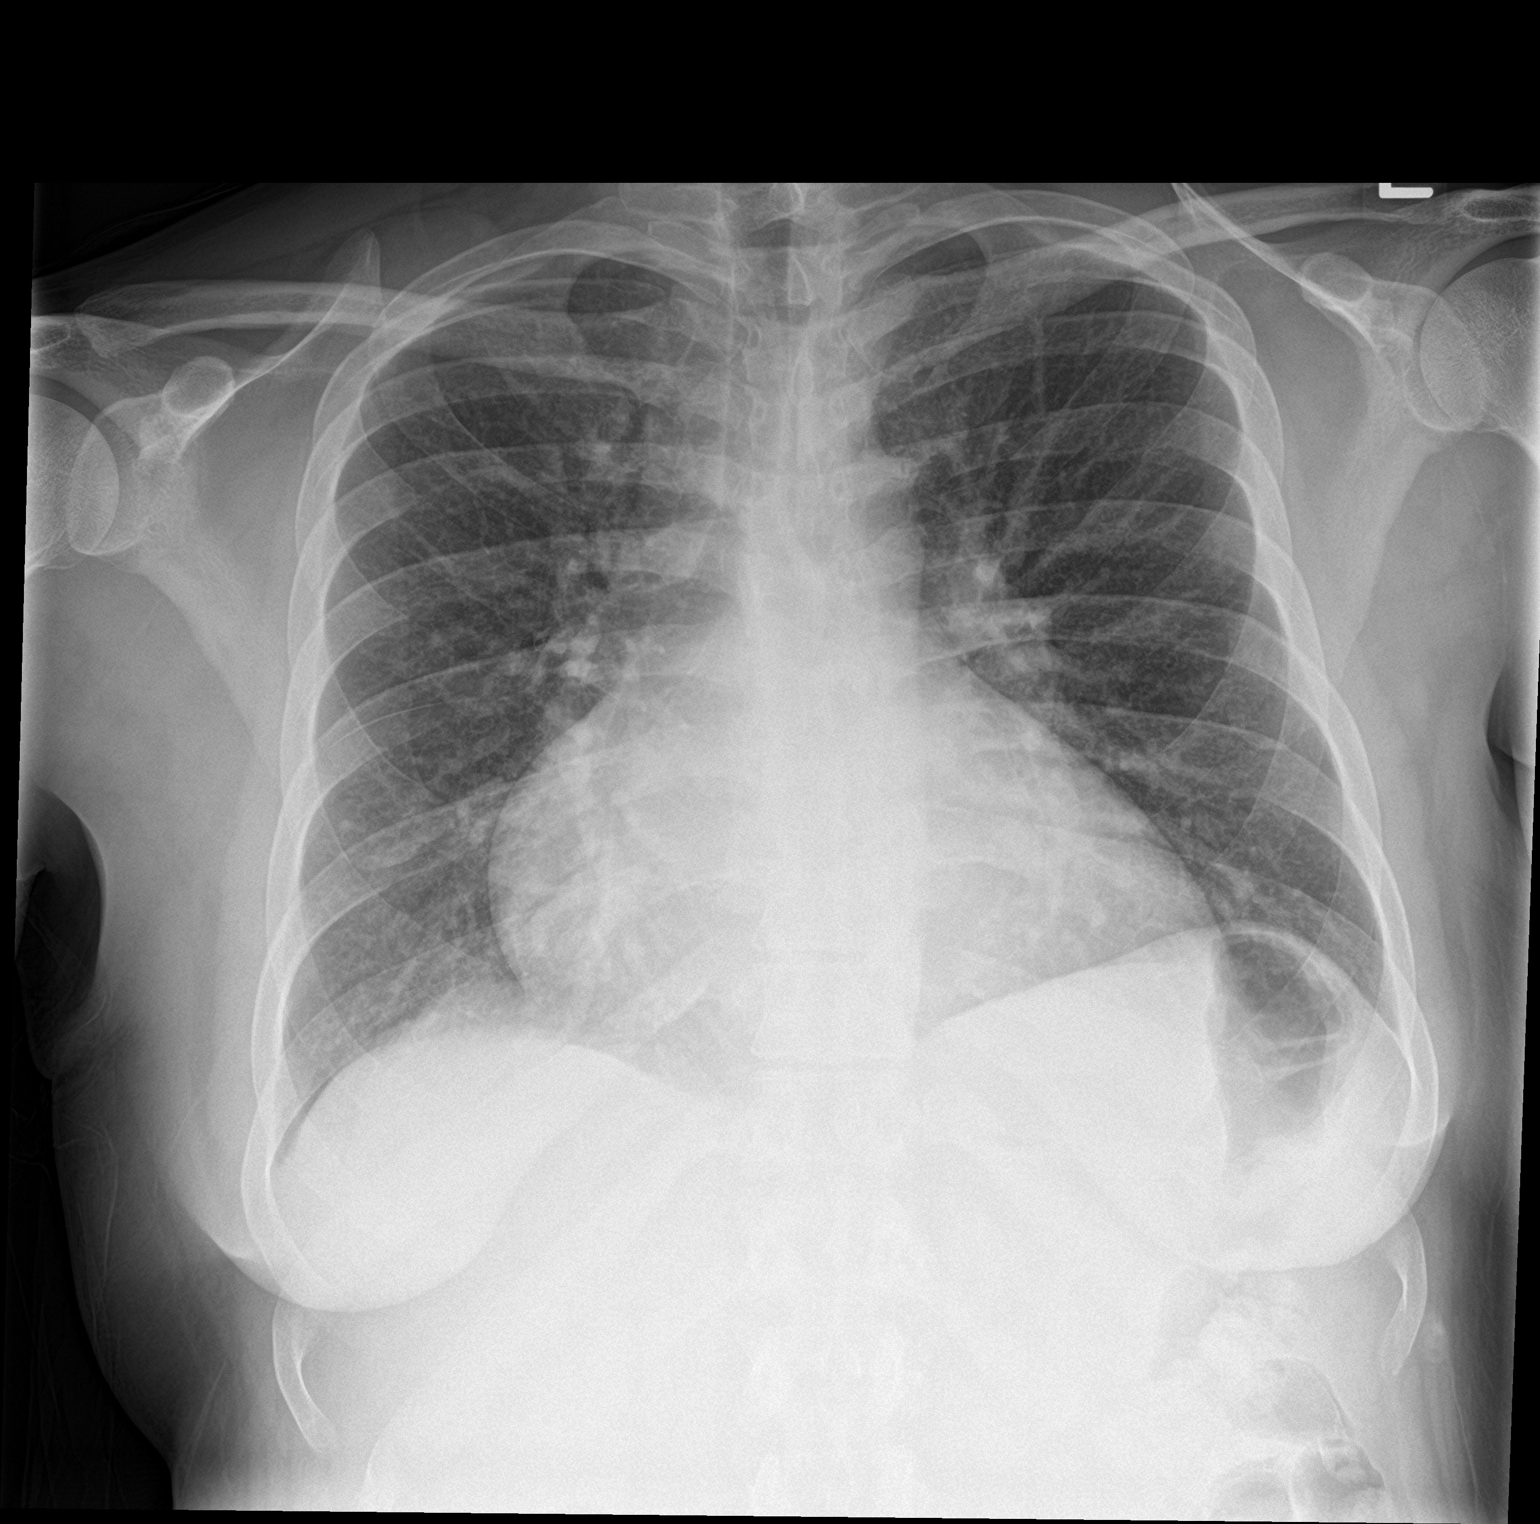

[chest lat]
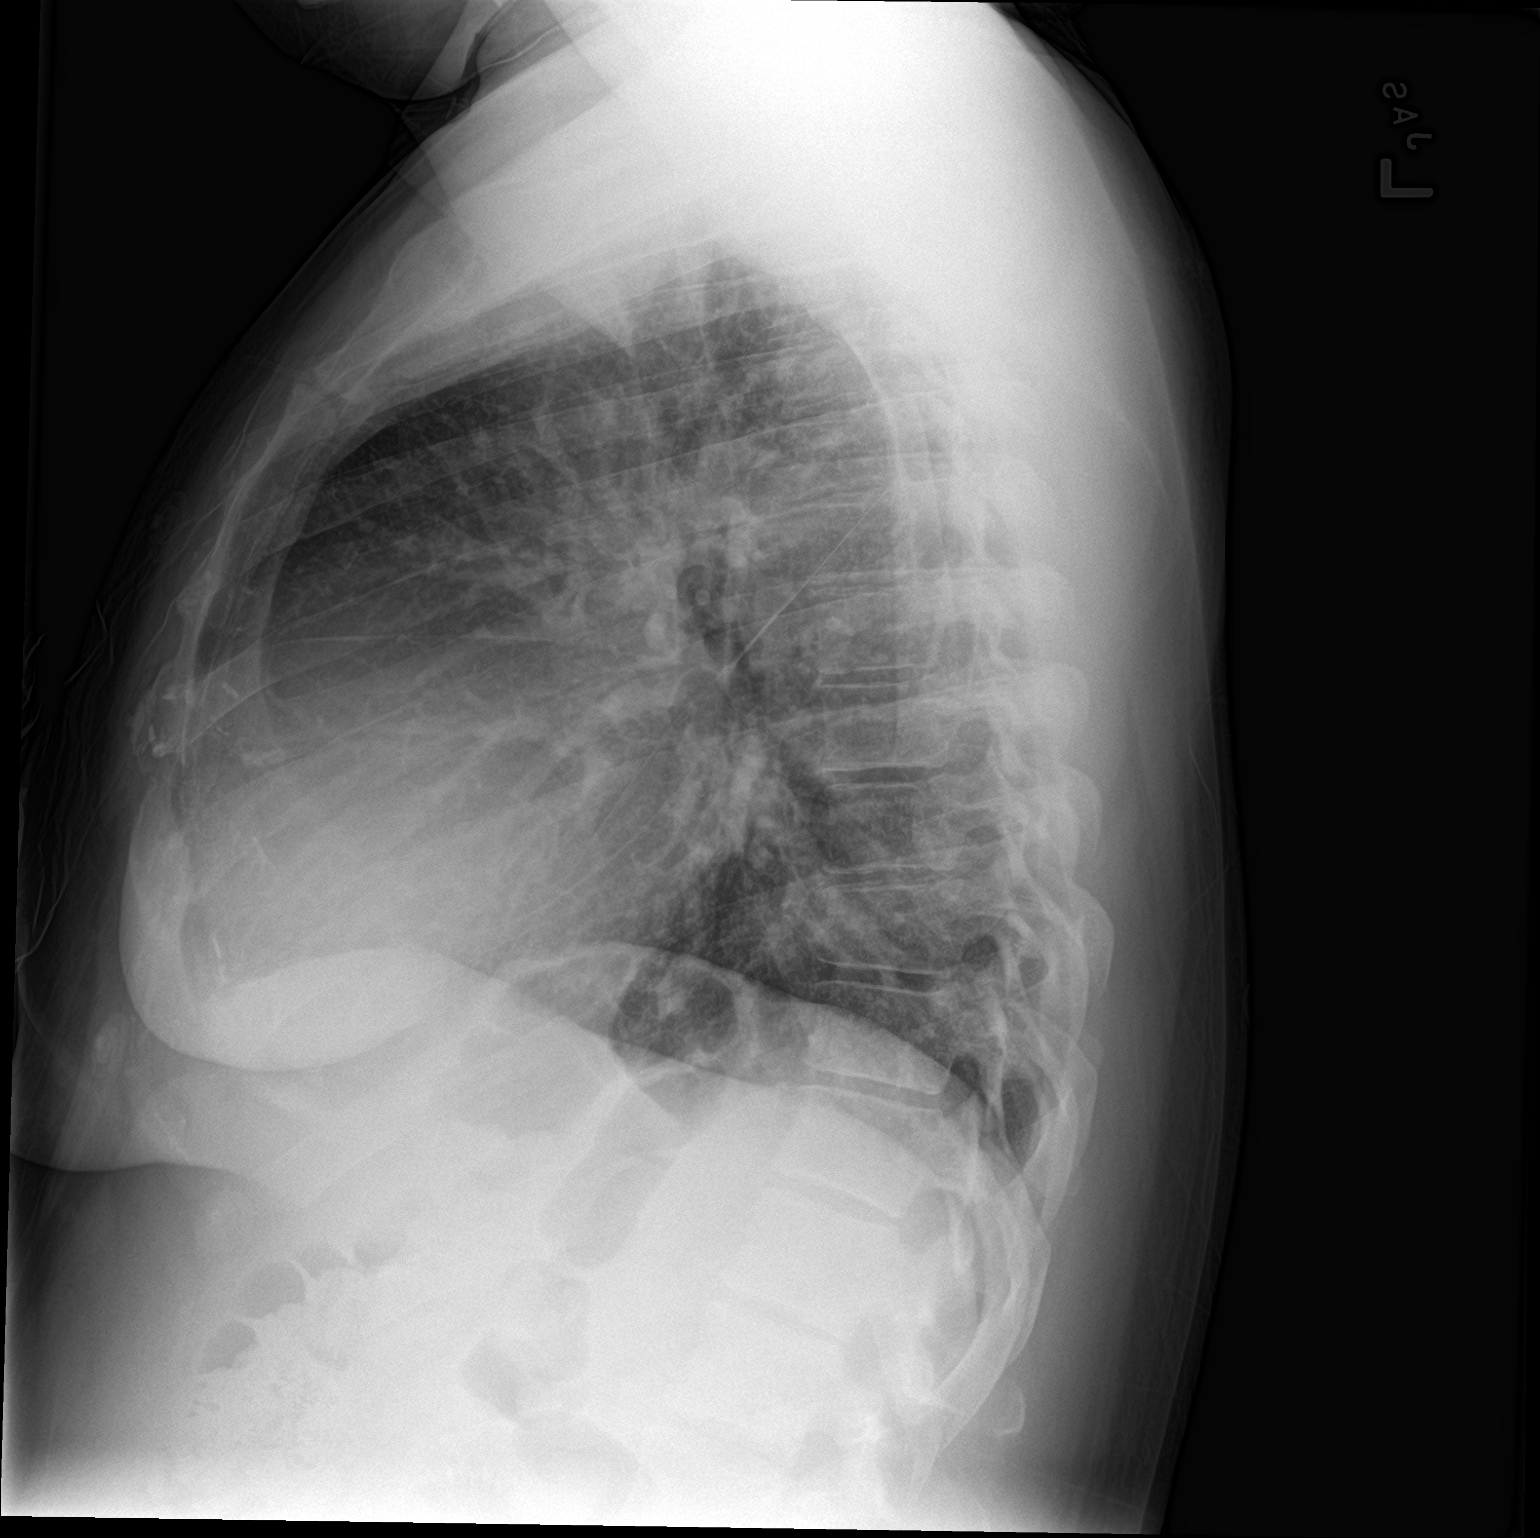

[2 of 2 positions shown; findings below may reference images not displayed]

FINDINGS: Lung volumes are normal. No consolidative airspace disease. No
pleural effusions. No pneumothorax. No evidence of pulmonary edema.
Heart size is enlarged. Upper mediastinal contours are within normal
limits.
IMPRESSION: 1. No radiographic evidence of acute cardiopulmonary disease.
2. Cardiomegaly.

## 2023-09-24 IMAGING — CT CT CHEST-ABD-PELV W/ CM
3 of 5 series · 14 of 36 positions shown, 16 images · IV contrast (agent unspecified)
Comparison: 04/26/2021

CLINICAL DATA: Hematologic malignancy, cirrhosis

EXAM:
CT CHEST, ABDOMEN, AND PELVIS WITH CONTRAST
TECHNIQUE: Multidetector CT imaging of the chest, abdomen and pelvis was
performed following the standard protocol during bolus
administration of intravenous contrast.

[Series 3: cap with 5mm st · axial · 0.89mm/px · z∈[+1121,+1646]mm · 9 of 133 slices shown, 11 images]
[im 14/133  mediastinal]
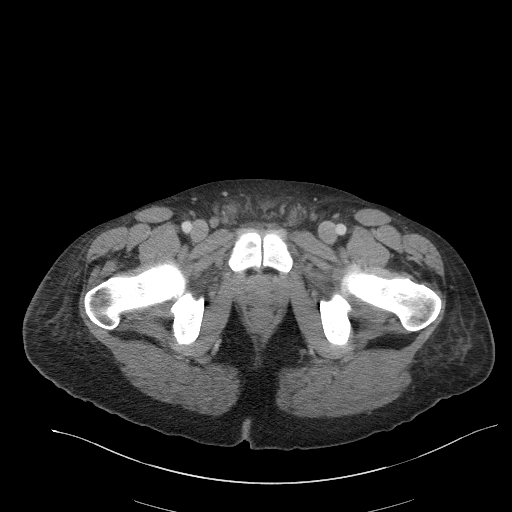
[im 14/133  bone]
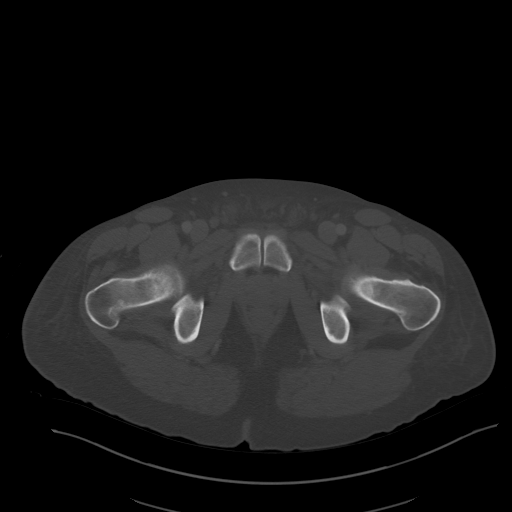
[im 27/133  mediastinal]
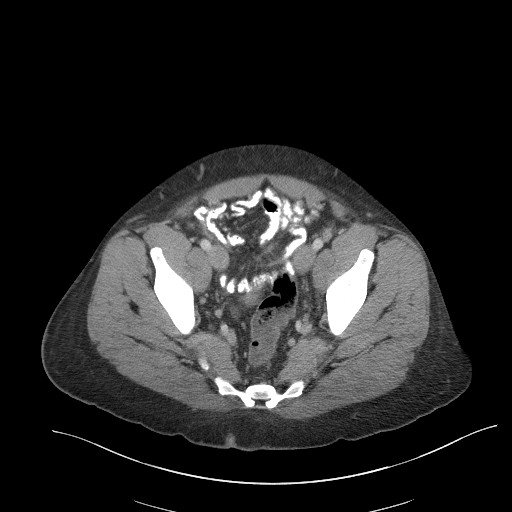
[im 40/133  mediastinal]
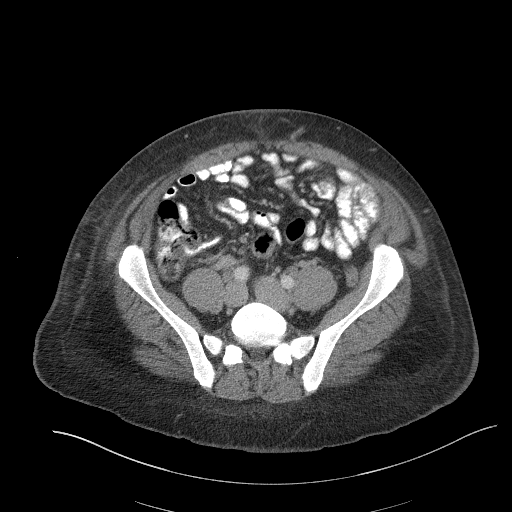
[im 53/133  mediastinal]
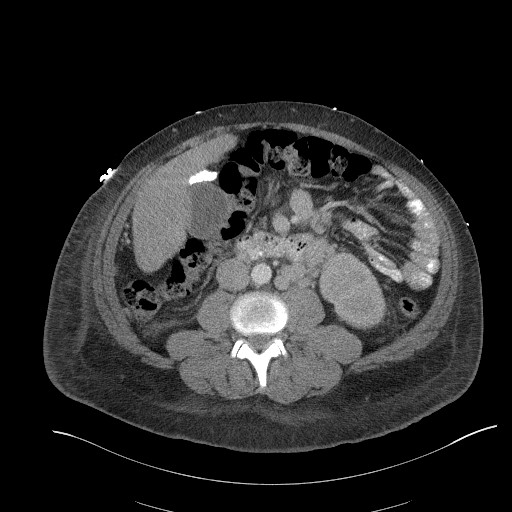
[im 67/133  mediastinal]
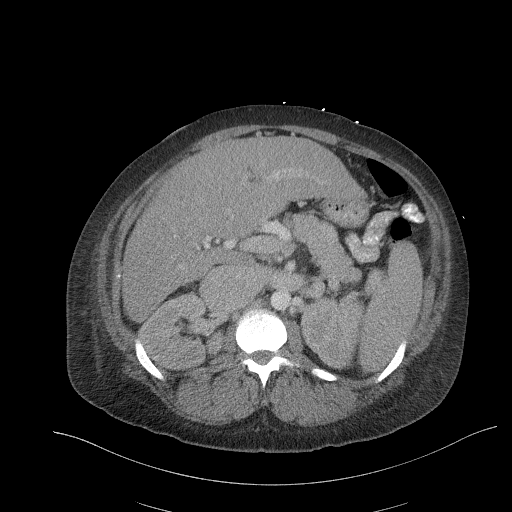
[im 80/133  mediastinal]
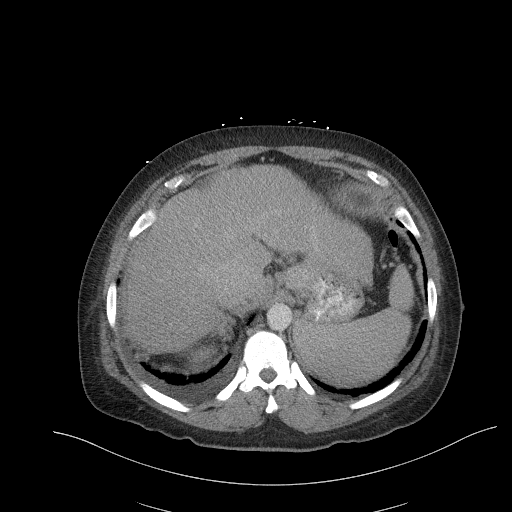
[im 93/133  mediastinal]
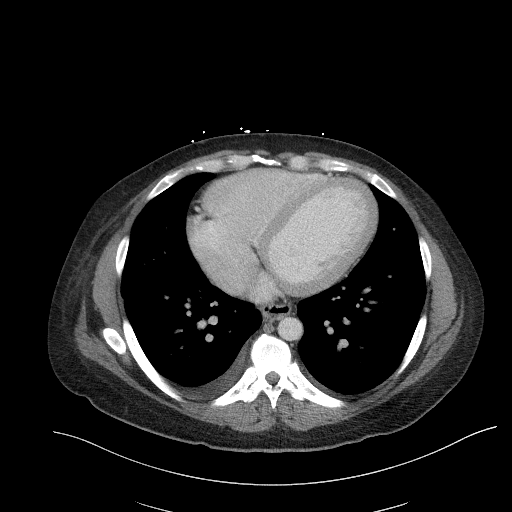
[im 106/133  mediastinal]
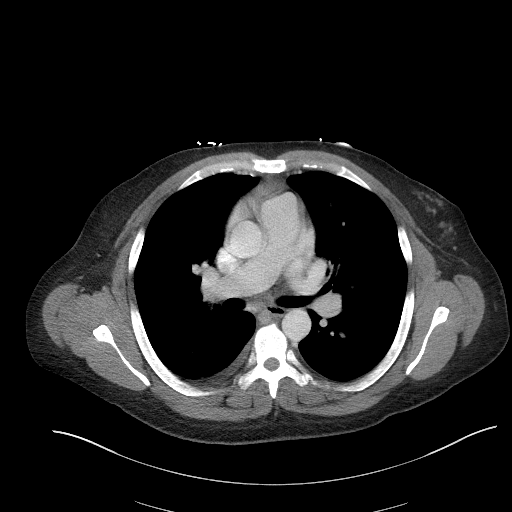
[im 119/133  mediastinal]
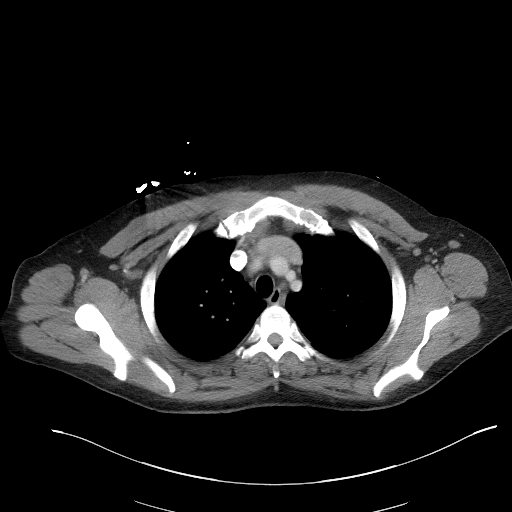
[im 119/133  bone]
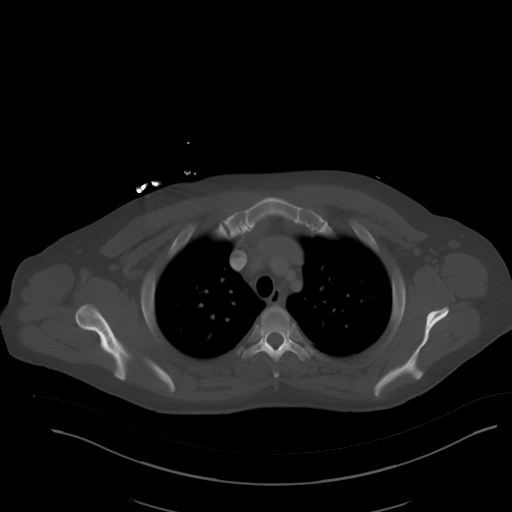

[Series 6: lung · axial · 0.87mm/px · z∈[+1432,+1484]mm · 2 of 155 slices shown]
[im 13/155  bone]
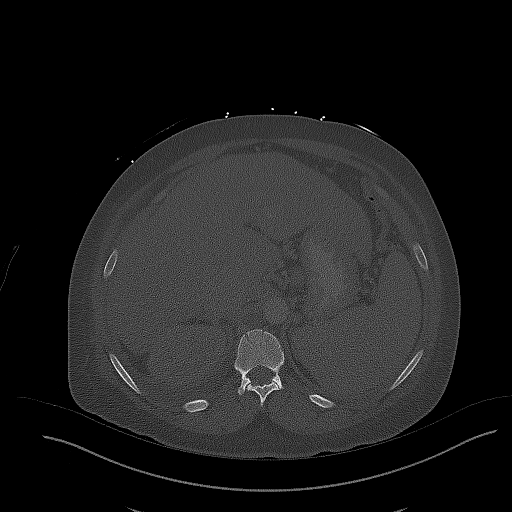
[im 39/155  bone]
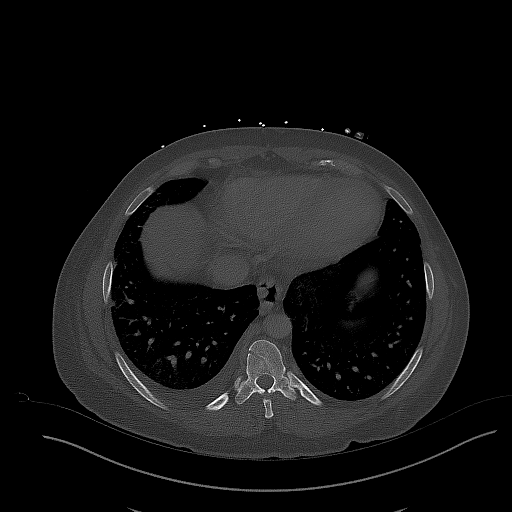

[Series 7: cap with 3mm st cor · coronal · 0.84mm/px · 3 of 177 slices shown]
[im 36/177  mediastinal]
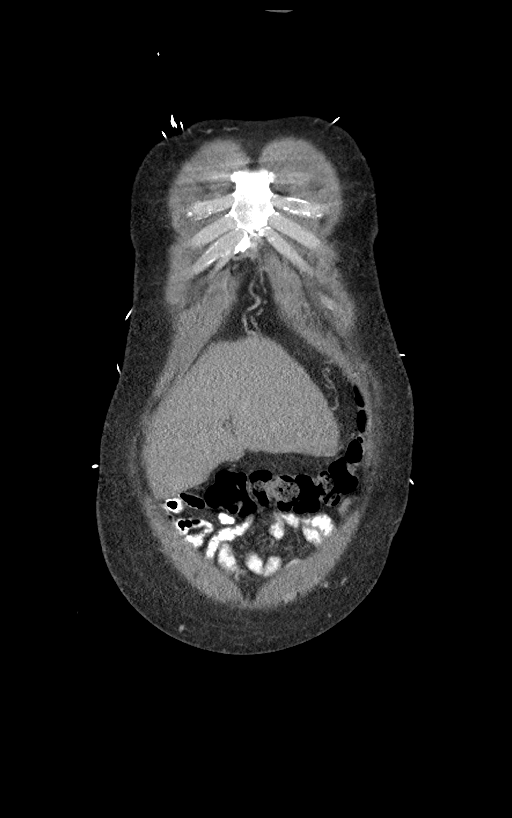
[im 71/177  mediastinal]
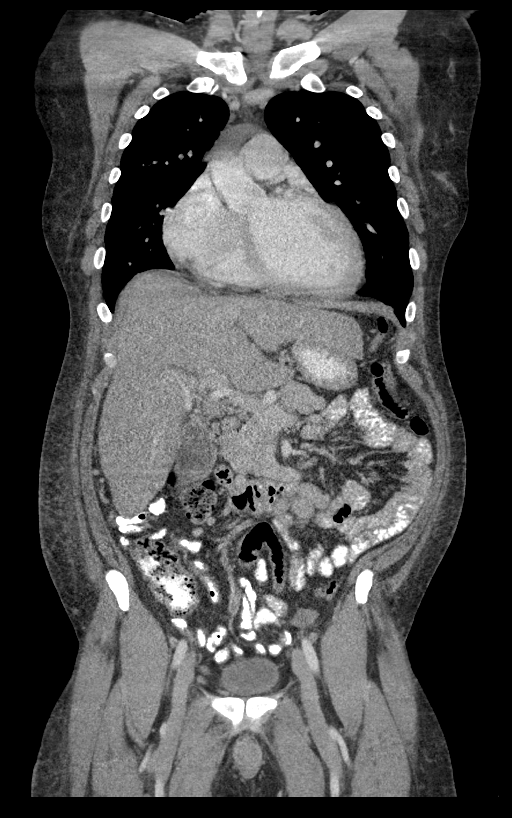
[im 106/177  mediastinal]
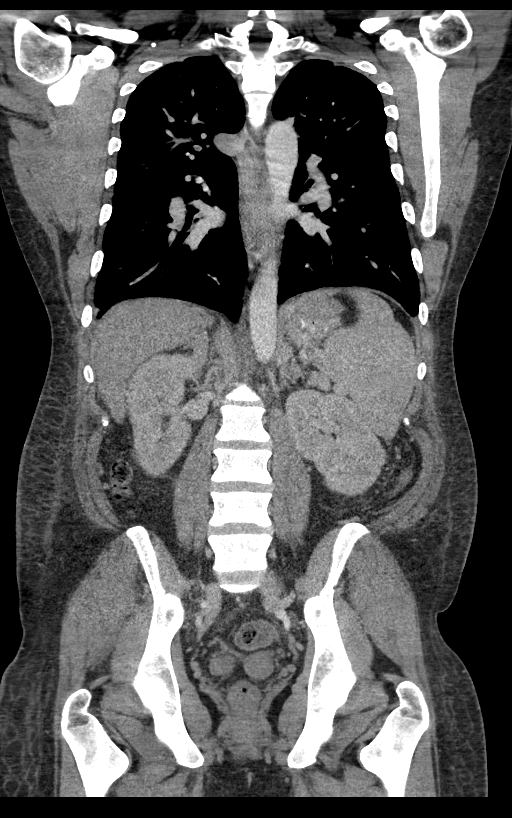

[14 of 36 positions shown; findings below may reference images not displayed]

RADIATION DOSE REDUCTION: This exam was performed according to the
departmental dose-optimization program which includes automated
exposure control, adjustment of the mA and/or kV according to
patient size and/or use of iterative reconstruction technique.

CONTRAST:  100mL OMNIPAQUE IOHEXOL 300 MG/ML  SOLN
FINDINGS: CT CHEST FINDINGS

Cardiovascular: The heart is enlarged without pericardial effusion.
No evidence of thoracic aortic aneurysm or dissection.

Mediastinum/Nodes: No pathologic adenopathy within the mediastinum,
hila, or axillary regions. Thyroid, trachea, and esophagus are
unremarkable.

Lungs/Pleura: There are trace bilateral pleural effusions, right
greater than left. Patchy nodular ground-glass airspace disease is
seen on the right, most pronounced within the right lower lobe,
consistent with nonspecific inflammation/infection, hemorrhage, or
aspiration. Central airways are patent.

Musculoskeletal: No acute or destructive bony lesions. Bilateral
gynecomastia incidentally noted. Reconstructed images demonstrate no
additional findings.

CT ABDOMEN PELVIS FINDINGS

Hepatobiliary: Subtle nodularity of the liver capsule consistent
with cirrhosis. No focal liver abnormality identified on this single
phase exam. Calcified gallstones are identified without
cholecystitis. No biliary duct dilation.

Pancreas: Unremarkable. No pancreatic ductal dilatation or
surrounding inflammatory changes.

Spleen: Stable splenomegaly, measuring 12.7 x 7.3 by 11.3 cm,
consistent with portal venous hypertension in a patient with
cirrhosis. No focal parenchymal abnormality.

Adrenals/Urinary Tract: Adrenals are unremarkable. The kidneys
enhance normally and symmetrically. No urinary tract calculi or
obstructive uropathy. Bladder is grossly unremarkable.

Stomach/Bowel: No bowel obstruction or ileus. A normal decompressed
appendix is seen in the right lower quadrant. No bowel wall
thickening or inflammatory changes.

Vascular/Lymphatic: Numerous gastric, esophageal, and splenic
varices are seen within the upper abdomen consistent with portal
venous hypertension. There is recanalization of the umbilical vein,
with small varices at the umbilicus. No evidence of atherosclerosis.

Stable borderline enlarged bilateral external iliac chain lymph
nodes, largest on the right measuring 11 mm reference image 113. No
other pathologic adenopathy.

Reproductive: Prostate is unremarkable.

Other: No free fluid or free intraperitoneal gas. No abdominal wall
hernia.

Musculoskeletal: No acute or destructive bony lesions. Reconstructed
images demonstrate no additional findings.
IMPRESSION: 1. Patchy nodular ground-glass airspace disease throughout the right
lung, with associated trace right pleural effusion. Favor
inflammation/infection over hemorrhage or aspiration.
2. Cirrhosis, with portal venous hypertension manifested by numerous
varices, splenomegaly, and recanalization of the umbilical vein.
3. Cardiomegaly.
4. Cholelithiasis without cholecystitis.
5. Stable borderline enlarged bilateral external iliac chain
adenopathy. No pathologically enlarged lymph nodes are seen.
